# Patient Record
Sex: Female | Born: 1965 | Race: Black or African American | Hispanic: No | Marital: Single | Smoking: Never smoker
Health system: Southern US, Community
[De-identification: ages and names within clinical notes are randomized; demographics above are authoritative.]

## PROBLEM LIST (undated history)

## (undated) DIAGNOSIS — M109 Gout, unspecified: Secondary | ICD-10-CM

## (undated) DIAGNOSIS — E119 Type 2 diabetes mellitus without complications: Secondary | ICD-10-CM

## (undated) DIAGNOSIS — D649 Anemia, unspecified: Secondary | ICD-10-CM

## (undated) DIAGNOSIS — M069 Rheumatoid arthritis, unspecified: Secondary | ICD-10-CM

## (undated) DIAGNOSIS — M419 Scoliosis, unspecified: Secondary | ICD-10-CM

## (undated) DIAGNOSIS — E785 Hyperlipidemia, unspecified: Secondary | ICD-10-CM

## (undated) DIAGNOSIS — R52 Pain, unspecified: Secondary | ICD-10-CM

## (undated) DIAGNOSIS — R51 Headache: Secondary | ICD-10-CM

## (undated) DIAGNOSIS — Z9289 Personal history of other medical treatment: Secondary | ICD-10-CM

## (undated) DIAGNOSIS — D219 Benign neoplasm of connective and other soft tissue, unspecified: Secondary | ICD-10-CM

## (undated) DIAGNOSIS — G709 Myoneural disorder, unspecified: Secondary | ICD-10-CM

## (undated) DIAGNOSIS — R87619 Unspecified abnormal cytological findings in specimens from cervix uteri: Secondary | ICD-10-CM

## (undated) DIAGNOSIS — J45909 Unspecified asthma, uncomplicated: Secondary | ICD-10-CM

## (undated) DIAGNOSIS — G4730 Sleep apnea, unspecified: Secondary | ICD-10-CM

## (undated) DIAGNOSIS — R079 Chest pain, unspecified: Secondary | ICD-10-CM

## (undated) DIAGNOSIS — M1990 Unspecified osteoarthritis, unspecified site: Secondary | ICD-10-CM

## (undated) DIAGNOSIS — N939 Abnormal uterine and vaginal bleeding, unspecified: Secondary | ICD-10-CM

## (undated) DIAGNOSIS — D509 Iron deficiency anemia, unspecified: Secondary | ICD-10-CM

## (undated) DIAGNOSIS — E079 Disorder of thyroid, unspecified: Secondary | ICD-10-CM

## (undated) DIAGNOSIS — M7989 Other specified soft tissue disorders: Secondary | ICD-10-CM

## (undated) DIAGNOSIS — K219 Gastro-esophageal reflux disease without esophagitis: Secondary | ICD-10-CM

## (undated) DIAGNOSIS — F32A Depression, unspecified: Secondary | ICD-10-CM

## (undated) DIAGNOSIS — F329 Major depressive disorder, single episode, unspecified: Secondary | ICD-10-CM

## (undated) DIAGNOSIS — F419 Anxiety disorder, unspecified: Secondary | ICD-10-CM

## (undated) DIAGNOSIS — E039 Hypothyroidism, unspecified: Secondary | ICD-10-CM

## (undated) DIAGNOSIS — I10 Essential (primary) hypertension: Secondary | ICD-10-CM

## (undated) DIAGNOSIS — M797 Fibromyalgia: Secondary | ICD-10-CM

## (undated) DIAGNOSIS — C801 Malignant (primary) neoplasm, unspecified: Secondary | ICD-10-CM

## (undated) DIAGNOSIS — IMO0001 Reserved for inherently not codable concepts without codable children: Secondary | ICD-10-CM

## (undated) HISTORY — DX: Sleep apnea, unspecified: G47.30

## (undated) HISTORY — PX: UPPER GI ENDOSCOPY: SHX6162

## (undated) HISTORY — DX: Anxiety disorder, unspecified: F41.9

## (undated) HISTORY — DX: Depression, unspecified: F32.A

## (undated) HISTORY — DX: Unspecified osteoarthritis, unspecified site: M19.90

## (undated) HISTORY — PX: COLONOSCOPY: SHX174

## (undated) HISTORY — DX: Benign neoplasm of connective and other soft tissue, unspecified: D21.9

## (undated) HISTORY — DX: Unspecified abnormal cytological findings in specimens from cervix uteri: R87.619

## (undated) HISTORY — DX: Abnormal uterine and vaginal bleeding, unspecified: N93.9

## (undated) HISTORY — DX: Gout, unspecified: M10.9

## (undated) HISTORY — DX: Scoliosis, unspecified: M41.9

## (undated) HISTORY — DX: Anemia, unspecified: D64.9

## (undated) HISTORY — DX: Malignant (primary) neoplasm, unspecified: C80.1

## (undated) HISTORY — DX: Myoneural disorder, unspecified: G70.9

## (undated) HISTORY — DX: Major depressive disorder, single episode, unspecified: F32.9

## (undated) HISTORY — DX: Unspecified asthma, uncomplicated: J45.909

## (undated) HISTORY — DX: Fibromyalgia: M79.7

## (undated) HISTORY — DX: Rheumatoid arthritis, unspecified: M06.9

## (undated) HISTORY — DX: Hyperlipidemia, unspecified: E78.5

## (undated) HISTORY — DX: Disorder of thyroid, unspecified: E07.9

## (undated) HISTORY — DX: Iron deficiency anemia, unspecified: D50.9

---

## 1988-09-17 DIAGNOSIS — R87619 Unspecified abnormal cytological findings in specimens from cervix uteri: Secondary | ICD-10-CM

## 1988-09-17 HISTORY — DX: Unspecified abnormal cytological findings in specimens from cervix uteri: R87.619

## 1999-09-04 ENCOUNTER — Other Ambulatory Visit: Admission: RE | Admit: 1999-09-04 | Discharge: 1999-09-04 | Payer: Self-pay | Admitting: Family Medicine

## 2000-03-31 ENCOUNTER — Emergency Department (HOSPITAL_COMMUNITY): Admission: EM | Admit: 2000-03-31 | Discharge: 2000-04-01 | Payer: Self-pay | Admitting: *Deleted

## 2000-04-01 ENCOUNTER — Encounter: Payer: Self-pay | Admitting: Emergency Medicine

## 2000-12-20 ENCOUNTER — Ambulatory Visit (HOSPITAL_COMMUNITY): Admission: RE | Admit: 2000-12-20 | Discharge: 2000-12-20 | Payer: Self-pay | Admitting: *Deleted

## 2000-12-20 ENCOUNTER — Encounter: Payer: Self-pay | Admitting: *Deleted

## 2001-03-03 ENCOUNTER — Encounter: Payer: Self-pay | Admitting: Internal Medicine

## 2001-03-03 ENCOUNTER — Emergency Department (HOSPITAL_COMMUNITY): Admission: EM | Admit: 2001-03-03 | Discharge: 2001-03-03 | Payer: Self-pay | Admitting: Emergency Medicine

## 2002-03-11 ENCOUNTER — Ambulatory Visit (HOSPITAL_COMMUNITY): Admission: RE | Admit: 2002-03-11 | Discharge: 2002-03-11 | Payer: Self-pay | Admitting: Internal Medicine

## 2002-03-11 ENCOUNTER — Encounter: Payer: Self-pay | Admitting: Internal Medicine

## 2003-05-27 ENCOUNTER — Encounter: Payer: Self-pay | Admitting: Emergency Medicine

## 2003-05-27 ENCOUNTER — Emergency Department (HOSPITAL_COMMUNITY): Admission: EM | Admit: 2003-05-27 | Discharge: 2003-05-27 | Payer: Self-pay | Admitting: Emergency Medicine

## 2004-06-08 ENCOUNTER — Emergency Department (HOSPITAL_COMMUNITY): Admission: EM | Admit: 2004-06-08 | Discharge: 2004-06-09 | Payer: Self-pay | Admitting: Emergency Medicine

## 2004-10-02 ENCOUNTER — Emergency Department (HOSPITAL_COMMUNITY): Admission: EM | Admit: 2004-10-02 | Discharge: 2004-10-02 | Payer: Self-pay | Admitting: Emergency Medicine

## 2004-10-11 ENCOUNTER — Ambulatory Visit (HOSPITAL_COMMUNITY): Admission: RE | Admit: 2004-10-11 | Discharge: 2004-10-11 | Payer: Self-pay | Admitting: Sports Medicine

## 2006-02-11 ENCOUNTER — Emergency Department (HOSPITAL_COMMUNITY): Admission: EM | Admit: 2006-02-11 | Discharge: 2006-02-11 | Payer: Self-pay | Admitting: *Deleted

## 2007-09-03 ENCOUNTER — Emergency Department (HOSPITAL_COMMUNITY): Admission: EM | Admit: 2007-09-03 | Discharge: 2007-09-03 | Payer: Self-pay | Admitting: Emergency Medicine

## 2008-01-19 ENCOUNTER — Emergency Department (HOSPITAL_COMMUNITY): Admission: EM | Admit: 2008-01-19 | Discharge: 2008-01-19 | Payer: Self-pay | Admitting: Emergency Medicine

## 2008-06-07 ENCOUNTER — Emergency Department (HOSPITAL_COMMUNITY): Admission: EM | Admit: 2008-06-07 | Discharge: 2008-06-07 | Payer: Self-pay | Admitting: Emergency Medicine

## 2009-05-12 ENCOUNTER — Emergency Department (HOSPITAL_BASED_OUTPATIENT_CLINIC_OR_DEPARTMENT_OTHER): Admission: EM | Admit: 2009-05-12 | Discharge: 2009-05-12 | Payer: Self-pay | Admitting: Emergency Medicine

## 2009-05-12 ENCOUNTER — Ambulatory Visit: Payer: Self-pay | Admitting: Radiology

## 2009-12-17 ENCOUNTER — Ambulatory Visit: Payer: Self-pay | Admitting: Radiology

## 2009-12-17 ENCOUNTER — Emergency Department (HOSPITAL_BASED_OUTPATIENT_CLINIC_OR_DEPARTMENT_OTHER): Admission: EM | Admit: 2009-12-17 | Discharge: 2009-12-17 | Payer: Self-pay | Admitting: Emergency Medicine

## 2010-04-28 ENCOUNTER — Ambulatory Visit (HOSPITAL_COMMUNITY): Admission: RE | Admit: 2010-04-28 | Discharge: 2010-04-28 | Payer: Self-pay | Admitting: Family Medicine

## 2010-10-05 ENCOUNTER — Emergency Department (HOSPITAL_COMMUNITY)
Admission: EM | Admit: 2010-10-05 | Discharge: 2010-10-05 | Payer: Self-pay | Source: Home / Self Care | Admitting: Emergency Medicine

## 2010-10-07 ENCOUNTER — Encounter: Payer: Self-pay | Admitting: Sports Medicine

## 2010-12-06 LAB — COMPREHENSIVE METABOLIC PANEL
ALT: 14 U/L (ref 0–35)
AST: 21 U/L (ref 0–37)
Albumin: 3.8 g/dL (ref 3.5–5.2)
Creatinine, Ser: 0.7 mg/dL (ref 0.4–1.2)
Sodium: 144 mEq/L (ref 135–145)
Total Protein: 8.2 g/dL (ref 6.0–8.3)

## 2010-12-23 LAB — URINALYSIS, ROUTINE W REFLEX MICROSCOPIC
Bilirubin Urine: NEGATIVE
Glucose, UA: NEGATIVE mg/dL
Hgb urine dipstick: NEGATIVE
Ketones, ur: NEGATIVE mg/dL
Nitrite: NEGATIVE
Protein, ur: NEGATIVE mg/dL
Specific Gravity, Urine: 1.019 (ref 1.005–1.030)
Urobilinogen, UA: 1 mg/dL (ref 0.0–1.0)
pH: 7 (ref 5.0–8.0)

## 2010-12-23 LAB — URINE MICROSCOPIC-ADD ON

## 2010-12-23 LAB — PREGNANCY, URINE: Preg Test, Ur: NEGATIVE

## 2011-05-10 ENCOUNTER — Other Ambulatory Visit: Payer: Self-pay | Admitting: Family Medicine

## 2011-05-10 ENCOUNTER — Other Ambulatory Visit (HOSPITAL_COMMUNITY): Payer: Self-pay | Admitting: Family Medicine

## 2011-05-10 DIAGNOSIS — Z1231 Encounter for screening mammogram for malignant neoplasm of breast: Secondary | ICD-10-CM

## 2011-05-18 ENCOUNTER — Ambulatory Visit (HOSPITAL_COMMUNITY)
Admission: RE | Admit: 2011-05-18 | Discharge: 2011-05-18 | Disposition: A | Discharge status: Home / Self Care | Payer: Self-pay | Source: Ambulatory Visit | Attending: Family Medicine | Admitting: Family Medicine

## 2011-05-18 DIAGNOSIS — Z1231 Encounter for screening mammogram for malignant neoplasm of breast: Secondary | ICD-10-CM

## 2011-06-18 ENCOUNTER — Inpatient Hospital Stay (HOSPITAL_COMMUNITY)
Admission: EM | Admit: 2011-06-18 | Discharge: 2011-06-21 | DRG: 313 | Disposition: A | Discharge status: Home / Self Care | Payer: Self-pay | Attending: Family Medicine | Admitting: Family Medicine

## 2011-06-18 ENCOUNTER — Emergency Department (HOSPITAL_COMMUNITY): Payer: Self-pay

## 2011-06-18 DIAGNOSIS — R9431 Abnormal electrocardiogram [ECG] [EKG]: Secondary | ICD-10-CM | POA: Diagnosis present

## 2011-06-18 DIAGNOSIS — R0789 Other chest pain: Principal | ICD-10-CM | POA: Diagnosis present

## 2011-06-18 DIAGNOSIS — E042 Nontoxic multinodular goiter: Secondary | ICD-10-CM | POA: Diagnosis present

## 2011-06-18 DIAGNOSIS — R0602 Shortness of breath: Secondary | ICD-10-CM | POA: Diagnosis present

## 2011-06-18 DIAGNOSIS — Z8249 Family history of ischemic heart disease and other diseases of the circulatory system: Secondary | ICD-10-CM

## 2011-06-18 DIAGNOSIS — R079 Chest pain, unspecified: Secondary | ICD-10-CM | POA: Diagnosis present

## 2011-06-18 DIAGNOSIS — Z6841 Body Mass Index (BMI) 40.0 and over, adult: Secondary | ICD-10-CM

## 2011-06-18 DIAGNOSIS — F411 Generalized anxiety disorder: Secondary | ICD-10-CM | POA: Diagnosis present

## 2011-06-18 DIAGNOSIS — J45909 Unspecified asthma, uncomplicated: Secondary | ICD-10-CM | POA: Diagnosis present

## 2011-06-18 DIAGNOSIS — R42 Dizziness and giddiness: Secondary | ICD-10-CM | POA: Diagnosis present

## 2011-06-18 LAB — CBC
Hemoglobin: 12 g/dL (ref 12.0–15.0)
MCH: 28.2 pg (ref 26.0–34.0)
MCHC: 31.7 g/dL (ref 30.0–36.0)
MCV: 89.2 fL (ref 78.0–100.0)
RBC: 4.25 MIL/uL (ref 3.87–5.11)
RDW: 14.9 % (ref 11.5–15.5)

## 2011-06-18 LAB — TROPONIN I: Troponin I: <0.3 ng/mL (ref <0.30)

## 2011-06-18 LAB — BASIC METABOLIC PANEL
BUN: 13 mg/dL (ref 6–23)
CO2: 25 mEq/L (ref 19–32)
Creatinine, Ser: 0.6 mg/dL (ref 0.50–1.10)
GFR calc Af Amer: >90 mL/min (ref >90)
GFR calc non Af Amer: >90 mL/min (ref >90)

## 2011-06-18 LAB — CK TOTAL AND CKMB (NOT AT ARMC)
CK, MB: 2.2 ng/mL (ref 0.3–4.0)
Relative Index: 1.4 (ref 0.0–2.5)
Total CK: 156 U/L (ref 7–177)

## 2011-06-18 LAB — DIFFERENTIAL
Eosinophils Absolute: 0.2 10*3/uL (ref 0.0–0.7)
Eosinophils Relative: 2 % (ref 0–5)
Lymphs Abs: 3.7 10*3/uL (ref 0.7–4.0)
Monocytes Absolute: 0.8 10*3/uL (ref 0.1–1.0)
Neutro Abs: 5.3 10*3/uL (ref 1.7–7.7)

## 2011-06-18 MED ORDER — IOHEXOL 300 MG/ML  SOLN
100.0000 mL | Freq: Once | INTRAMUSCULAR | Status: AC | PRN
  Administered 2011-06-18: 76 mL via INTRAVENOUS

## 2011-06-19 ENCOUNTER — Inpatient Hospital Stay (HOSPITAL_COMMUNITY): Payer: Self-pay

## 2011-06-19 LAB — LIPID PANEL
Cholesterol: 147 mg/dL (ref 0–200)
HDL: 51 mg/dL (ref >39)
Total CHOL/HDL Ratio: 2.9 RATIO
Triglycerides: 49 mg/dL (ref <150)
VLDL: 10 mg/dL (ref 0–40)

## 2011-06-19 LAB — COMPREHENSIVE METABOLIC PANEL
ALT: 18 U/L (ref 0–35)
AST: 17 U/L (ref 0–37)
Albumin: 3.1 g/dL — ABNORMAL LOW (ref 3.5–5.2)
CO2: 27 mEq/L (ref 19–32)
Calcium: 8.8 mg/dL (ref 8.4–10.5)
GFR calc non Af Amer: >90 mL/min (ref >90)
Sodium: 138 mEq/L (ref 135–145)
Total Protein: 7.7 g/dL (ref 6.0–8.3)

## 2011-06-19 LAB — CARDIAC PANEL(CRET KIN+CKTOT+MB+TROPI)
CK, MB: 1.8 ng/mL (ref 0.3–4.0)
Relative Index: 1.5 (ref 0.0–2.5)
Total CK: 141 U/L (ref 7–177)
Troponin I: <0.3 ng/mL (ref <0.30)

## 2011-06-19 LAB — T4, FREE: Free T4: 0.94 ng/dL (ref 0.80–1.80)

## 2011-06-19 LAB — T3, FREE: T3, Free: 2.4 pg/mL (ref 2.3–4.2)

## 2011-06-20 ENCOUNTER — Inpatient Hospital Stay (HOSPITAL_COMMUNITY): Payer: Self-pay

## 2011-06-21 ENCOUNTER — Other Ambulatory Visit (HOSPITAL_COMMUNITY): Payer: Self-pay

## 2011-06-21 LAB — COMPREHENSIVE METABOLIC PANEL
ALT: 18 U/L (ref 0–35)
Alkaline Phosphatase: 103 U/L (ref 39–117)
BUN: 12 mg/dL (ref 6–23)
CO2: 29 mEq/L (ref 19–32)
Chloride: 100 mEq/L (ref 96–112)
GFR calc Af Amer: >90 mL/min (ref >90)
Glucose, Bld: 97 mg/dL (ref 70–99)
Potassium: 3.6 mEq/L (ref 3.5–5.1)
Total Bilirubin: 0.3 mg/dL (ref 0.3–1.2)

## 2011-06-30 NOTE — Discharge Summary (Signed)
NAME:  Emily Mathis, Emily Mathis               ACCOUNT NO.:  [id number]  MEDICAL RECORD NO.:  [id number]  LOCATION:  1438                         FACILITY:  [facility]  PHYSICIAN:  Pleas Koch, MD        DATE OF BIRTH:  [birth year]  DATE OF ADMISSION:  06/18/2011 DATE OF DISCHARGE:  06/21/2011                              DISCHARGE SUMMARY   DISCHARGE DIAGNOSES: 1. Unlikely cardiogenic chest pain - to follow up with Dr. Jeanella Cara in     the outpatient setting for risk factor modification as well as     cardiac eval. 2. Multinodular goiter with a TSH of 2.26, a T3 of 2.4, and a free T4     of 0.94 - to follow up with Dr. Adrian Prince with [facility]. 3. Hyperlipidemia with LDL cholesterol 86 and total of 137, VLDL 10. 4. Ultrasound done unremarkable for cholecystitis. 5. Morbid obesity. 6. Significant anxiety issues.  DISCHARGE MEDICATIONS:  Are as follows. 1. Ventolin inhaler 2 puffs q.4 p.r.n. 2. Aspirin 81 mg daily.  PERTINENT CONSULTS:  Tera Mater. Evlyn Kanner, M.D. over telephone regarding her thyroid mass.  PERTINENT RADIOLOGICAL STUDIES: 1. CT angiogram chest on June 18, 2011, showing no evidence of PE,     no evidence aortic dissection.  Multinodular goiter with extension     into anterior mediastinum. 2. Chest x-ray portable on June 19, 2011, showed no active evidence     of active cardiac pulmonary disease. 3. Ultrasound of abdomen on June 20, 2011, showed evaluation of     pancreas limited by bowel gas.  Evaluation limited by habitus     without gross abnormality.  Rest of exam unremarkable. 4. Ultrasound soft tissue head and neck showed multinodular goiter     with several nodules seen within the left lobe, 2 of which are     large, measuring 1.1 x 1.2 x 1.8 cm and 2.1 x 1.9x 2 cm.  Based on     size criteria and biopsy of 2 largest thyroid nodules is     recommended.  Lesions are accessible by ultrasound guided fine     needle aspiration.  Third nodule  small in size 6 x 6 x 5 mm shows     potential small calcification - recommend follow up ultrasound in 6     months to assess lesion. (this finding was discussed with Dr.     Adrian Prince, who recommend FNAC - Dr. Fredia Sorrow of Interventional     Radiology reviewed the studies and noted that these are shadowing     areas of possible calcification and did not recommend given the     patient's age and the patient's current euthyroid status anything     other than a repeat ultrasound of neck in 6 months - Dr. Evlyn Kanner to     follow up with the patient as per my discussion with him over     telephone in 1-2 weeks.  HOSPITAL COURSE:  This is a pleasant 45 year old female with asthma on p.r.n. inhaler and anxiety who presents with intermittent chest pain for past month.  She states it was nonexertional about  2 two times per week, most over right sternal junction radiating towards her back.  No nausea, vomiting, chills, fever, black stool, tarry stool, or other issues. There is some facial component and she had a reproducibility.  EKG showed normal sinus rhythm with no ST-T wave changes, normal white count, hemoglobin slightly anemic at 12. BUN 39, creatinine 0.6.  No chest x-ray was done.  Hospice was asked to admit to rule out. Pertinent positives on admission, the patient is oriented in no distress. Blood pressure was 126/56, pulse 83, respirations 18, temperature 99.  Her chest was clear.  No wheezes or rales.  S1 and S2. No murmurs, rubs,or gallops.  No calf tenderness.  Strong pulses.  1. Chest pain.  The patient was ruled out for pulmonary embolism by CT     angiogram and also this ruled out aortic dissection, pulmonary     embolus, and this was low suspicion.  She is admitted to Telemetry     and given 325 of aspirin initially, which subsequently     discontinued.  She had risk factor modification done and dietitian     visited her.  Her LDL is actually not bad for someone with just      morbid obesity and no history of hypertension or other metabolic     disease syndrome.  The patient will need follow up.  I have     recommended the patient to follow up with Dr. Jeanella Cara at his     office as per her convenience just for risk factor further     modification and possible exercise stress test as she is at low-     risk if not intermediate risk for cardiac event. 2. Coincidental multinodular goiter noted on CT chest.  The patient     had a CT angiogram, which showed an incidental multinodular goiter.     Her TSH is normal as per dictation above.  I discussed her care     with Dr. Evlyn Kanner, who recommended initially FNAC; however, on further     discussion with Dr. Fredia Sorrow, who reviewed the images in detail, he     recommended a followup ultrasound in 6 months.  The patient will     need a follow up ultrasound and ultimately follow up with Dr.     Adrian Prince in about 1-2 weeks as per my discussion with him.  I     have elected to hold on any thyroid medications at this time, given     her euthyroid state and possible subclinical hypothyroidism.  I     will leave this decision and discussion up to Dr. Evlyn Kanner as an     outpatient.  Dr. Rinaldo Cloud office number has been given to the patient and the patient is instructed to call. 1. Morbid obesity.  The patient was instructed regarding risk factor     modification once again and will need further follow up with diet     and exercise.  She will see Dr. Andrey Campanile at [facility] on August 01, 2011, at 10:45 and she has eligibility appointment on July 23, 2011, at 9 a.m. for the same.  She is instructed to keep this     appointment. 2. Questionable history of cholecystitis.  The patient had some     element of pain in the right shoulder, which prompted an ultrasound     of the abdomen, which was ultimately  unrevealing for cholecystitis.     The patient was tolerating full diet on day of discharge, had no     other issues.   She was comfortable in bed and had no further need     for issue.  Please note an echocardiogram was done during this     hospitalization, which showed EF of 60% to 65%.  Please note, the     patient did not have an echocardiogram during this admission given     her likely low probability of cardiac ischemia and her positional     quality of pain.  The patient will benefit from further risk factor     modification and workup as an outpatient.  The patient was seen on     discharge, was doing well, had no nausea or vomiting with sitting     in bed with temperature 97.7, pulse 82, respirations 18, blood     pressure 108 to 125 over 73 to 85, O2 sats 97% on room air.  Abdomen: Soft, nontender, nondistended.  CTAB.  S1-S2.  No murmurs, rubs, or gallops.  Pulses were equal throughout.  I have had a full long and extensive discussion with the patient and the patient is agreeable to outpatient management and work.          ______________________________ Pleas Koch, MD     JS/MEDQ  D:  06/21/2011  T:  06/21/2011  Job:  [id number]  cc:   Jeannett Senior A. Evlyn Kanner, M.D. Fax: [phone]  Pamella Pert, MD Fax: [phone]  Dr. Andrey Campanile at [facility]  Electronically Signed by Pleas Koch MD on 06/30/2011 06:53:10 PM

## 2011-07-04 ENCOUNTER — Other Ambulatory Visit (HOSPITAL_COMMUNITY): Payer: Self-pay | Admitting: Cardiology

## 2011-07-04 DIAGNOSIS — R0602 Shortness of breath: Secondary | ICD-10-CM

## 2011-07-04 NOTE — H&P (Signed)
NAME:  Emily Mathis, Emily Mathis NO.:  [id number]  MEDICAL RECORD NO.:  [id number]  LOCATION:  [facility]                         FACILITY:  [facility]  PHYSICIAN:  Houston Siren, MD           DATE OF BIRTH:  1966-07-15  DATE OF ADMISSION:  06/18/2011 DATE OF DISCHARGE:                             HISTORY & PHYSICAL   PRIMARY CARE PHYSICIAN:  None.  ADVANCE DIRECTIVE:  Full code.  REASON FOR ADMISSION:  Chest pain.  HISTORY OF PRESENT ILLNESS:  This is a 45 year old female with benign past medical history (asthma on p.r.n. inhaler) and anxiety, no medications, presents to the emergency room with intermittent chest pain for 1 month.  She states that she has nonexertional chest pain about 2 times per week, mostly over the right costal sternal junction, radiates toward her back, with some shortness of breath and mild diaphoresis. She denied any nausea, vomiting, fever, chills, black stool, or bloody stool.  There is some positional component to her chest pain.  She has no fever, chills, or cough.  Evaluation in emergency room included an EKG showing normal sinus rhythm with no acute ST-T changes.  Normal white count of 10,000, hemoglobin of 12.0, troponin of 0, potassium of 3.7, BUN of 39, creatinine 0.6.  There was no chest x-ray done. Hospitalist was asked to admit the patient for rule out.  PAST MEDICAL HISTORY:  Asthma.  MEDICATIONS:  Ventolin inhaler p.r.n.  ALLERGIES:  Some sort of ANTIBIOTICS, she could not recall.  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 She does not smoke, drink, or use any drugs.  FAMILY HISTORY:  Father died of an MI at age 49.  Mother alive and well, but had 2 MIs prior to 15.  REVIEW OF SYSTEMS:  Otherwise, unremarkable.  PHYSICAL EXAMINATION:  VITAL SIGNS:  Blood pressure 126/66, pulse 83, respiratory rate 18, temperature 99.0. GENERAL:  She is alert and oriented and is in no apparent distress. SKIN:  Warm and  dry. HEENT:  Sclerae nonicteric.  Throat is clear.  Speech is fluent. NECK:  Supple.  No carotid bruit. HEART:  Revealed S1 and S2, regular.  I did not hear any murmur, rub, or gallop. LUNGS:  Clear.  No wheezes, rales, or any evidence of consolidation. Palpation of her anterior chest wall around the right sternocostal junction show minimal to no tenderness. ABDOMEN:  She has no right upper quadrant pain.  Bowel sounds present. Abdomen is slightly obese. EXTREMITIES:  No edema.  No calf tenderness.  Strong pulses peripherally in both upper and lower extremities. NEUROLOGIC AND PSYCHIATRIC:  Unremarkable.  She does appear to be slightly anxious.  OBJECTIVE FINDINGS:  White count 10,000, hemoglobin 12.0.  EKG shows normal sinus rhythm without any acute ST-T changes.  BUN of 89, creatinine of 3.7, troponin of 0.  IMPRESSION:  This is a 45 year old female with increased cardiac risk factor, presenting with atypical chest pain.  She has pain for a month, intermittently and with unremarkable EKG and troponins of 0, I suspect that this is not cardiac in its etiology.  Because of her pain, she said was  severe, radiating toward her back, We will get a CT angiogram for chest to rule out aortic dissection and pulmonary embolism.  This is low suspicion.  We will admit her to Telemetry for rule out, treat her with aspirin and benzodiazepine.  We will get her fasting lipids as well. Risk modifications discussed at length.  We will admit her to [facility] 2, Telemetry>     Houston Siren, MD     PL/MEDQ  D:  06/18/2011  T:  06/18/2011  Job:  [id number]  Electronically Signed by Houston Siren  on 07/03/2011 11:57:08 PM

## 2011-07-10 ENCOUNTER — Ambulatory Visit (HOSPITAL_COMMUNITY): Payer: Self-pay

## 2011-07-10 ENCOUNTER — Encounter (HOSPITAL_COMMUNITY)
Admission: RE | Admit: 2011-07-10 | Discharge: 2011-07-10 | Disposition: A | Discharge status: Home / Self Care | Payer: Self-pay | Source: Ambulatory Visit | Attending: Cardiology | Admitting: Cardiology

## 2011-07-10 DIAGNOSIS — R0602 Shortness of breath: Secondary | ICD-10-CM | POA: Insufficient documentation

## 2011-07-10 MED ORDER — TECHNETIUM TC 99M TETROFOSMIN IV KIT
30.0000 | PACK | Freq: Once | INTRAVENOUS | Status: AC | PRN
  Administered 2011-07-10: 30 via INTRAVENOUS

## 2011-07-10 MED ORDER — TECHNETIUM TC 99M TETROFOSMIN IV KIT
10.0000 | PACK | Freq: Once | INTRAVENOUS | Status: AC | PRN
  Administered 2011-07-10: 10 via INTRAVENOUS

## 2011-07-20 ENCOUNTER — Encounter (HOSPITAL_COMMUNITY): Payer: Self-pay | Admitting: Pharmacy Technician

## 2011-07-23 ENCOUNTER — Encounter (HOSPITAL_COMMUNITY): Payer: Self-pay

## 2011-07-24 ENCOUNTER — Encounter (HOSPITAL_COMMUNITY)
Admission: RE | Disposition: A | Discharge status: Home / Self Care | Payer: Self-pay | Source: Ambulatory Visit | Attending: Cardiology

## 2011-07-24 ENCOUNTER — Ambulatory Visit (HOSPITAL_COMMUNITY)
Admission: RE | Admit: 2011-07-24 | Discharge: 2011-07-24 | Disposition: A | Discharge status: Home / Self Care | Payer: Self-pay | Source: Ambulatory Visit | Attending: Cardiology | Admitting: Cardiology

## 2011-07-24 DIAGNOSIS — R0989 Other specified symptoms and signs involving the circulatory and respiratory systems: Secondary | ICD-10-CM | POA: Insufficient documentation

## 2011-07-24 DIAGNOSIS — R079 Chest pain, unspecified: Secondary | ICD-10-CM

## 2011-07-24 DIAGNOSIS — R0609 Other forms of dyspnea: Secondary | ICD-10-CM | POA: Insufficient documentation

## 2011-07-24 HISTORY — PX: LEFT HEART CATHETERIZATION WITH CORONARY ANGIOGRAM: SHX5451

## 2011-07-24 LAB — PREGNANCY, URINE: Preg Test, Ur: NEGATIVE

## 2011-07-24 SURGERY — LEFT HEART CATHETERIZATION WITH CORONARY ANGIOGRAM

## 2011-07-24 MED ORDER — HEPARIN (PORCINE) IN NACL 2-0.9 UNIT/ML-% IJ SOLN
INTRAMUSCULAR | Status: AC
  Filled 2011-07-24: qty 2000

## 2011-07-24 MED ORDER — ASPIRIN 81 MG PO CHEW
CHEWABLE_TABLET | ORAL | Status: AC
  Administered 2011-07-24: 81 mg
  Filled 2011-07-24: qty 4

## 2011-07-24 MED ORDER — ACETAMINOPHEN 325 MG PO TABS: 650.0000 mg | ORAL_TABLET | ORAL | Status: DC | PRN

## 2011-07-24 MED ORDER — SODIUM CHLORIDE 0.9 % IV SOLN
INTRAVENOUS | Status: DC
  Administered 2011-07-24: 11:00:00 via INTRAVENOUS

## 2011-07-24 MED ORDER — ONDANSETRON HCL 4 MG/2ML IJ SOLN: 4.0000 mg | Freq: Four times a day (QID) | INTRAMUSCULAR | Status: DC | PRN

## 2011-07-24 MED ORDER — NITROGLYCERIN 0.2 MG/ML ON CALL CATH LAB
INTRAVENOUS | Status: AC
  Filled 2011-07-24: qty 1

## 2011-07-24 MED ORDER — MIDAZOLAM HCL 2 MG/2ML IJ SOLN
INTRAMUSCULAR | Status: AC
  Filled 2011-07-24: qty 2

## 2011-07-24 MED ORDER — DIPHENHYDRAMINE HCL 25 MG PO CAPS
ORAL_CAPSULE | ORAL | Status: AC
  Administered 2011-07-24: 14:00:00
  Filled 2011-07-24: qty 1

## 2011-07-24 MED ORDER — ASPIRIN 81 MG PO CHEW
243.0000 mg | CHEWABLE_TABLET | Freq: Once | ORAL | Status: DC
Start: 2011-07-24 — End: 2011-07-24
  Administered 2011-07-24: 243 mg via ORAL

## 2011-07-24 MED ORDER — HEPARIN SODIUM (PORCINE) 1000 UNIT/ML IJ SOLN
INTRAMUSCULAR | Status: AC
  Filled 2011-07-24: qty 1

## 2011-07-24 MED ORDER — HYDROMORPHONE HCL PF 2 MG/ML IJ SOLN
INTRAMUSCULAR | Status: AC
  Filled 2011-07-24: qty 1

## 2011-07-24 MED ORDER — SODIUM CHLORIDE 0.9 % IV SOLN: 1.0000 mL/kg/h | INTRAVENOUS | Status: DC

## 2011-07-24 MED ORDER — VERAPAMIL HCL 2.5 MG/ML IV SOLN
INTRAVENOUS | Status: AC
  Filled 2011-07-24: qty 2

## 2011-07-24 MED ORDER — DIPHENHYDRAMINE HCL 25 MG PO CAPS
25.0000 mg | ORAL_CAPSULE | Freq: Once | ORAL | Status: DC
  Filled 2011-07-24: qty 1

## 2011-07-24 MED ORDER — ASPIRIN 81 MG PO CHEW: 324.0000 mg | CHEWABLE_TABLET | ORAL | Status: DC

## 2011-07-24 NOTE — Op Note (Addendum)
Dictated

## 2011-07-24 NOTE — Brief Op Note (Addendum)
07/24/2011  12:03 PM  PATIENT:  Emily Mathis  45 y.o. female  PRE-OPERATIVE DIAGNOSIS:  Chest Pain  POST-OPERATIVE DIAGNOSIS:  * No post-op diagnosis entered *  PROCEDURE:  Procedure(s): LEFT HEART CATHETERIZATION WITH CORONARY ANGIOGRAM  SURGEON:  Surgeon(s): Pamella Pert  PHYSICIAN ASSISTANT:   ASSISTANTS: none   ANESTHESIA:   local  EBL:     BLOOD ADMINISTERED:none  DRAINS: none   LOCAL MEDICATIONS USED:   LIDOCAINE 2CC  SPECIMEN:  No Specimen  DISPOSITION OF SPECIMEN:  N/A  COUNTS:  YES and NO Not applicable  TOURNIQUET:  * No tourniquets in log *  DICTATION: .Other Dictation: Dictation Number [id number]  PLAN OF CARE: Discharge to home after PACU  PATIENT DISPOSITION:  Short Stay   Delay start of Pharmacological VTE agent (>24hrs) due to surgical blood loss or risk of bleeding:  not applicable

## 2011-07-24 NOTE — H&P (Signed)
f/u cardiolite done @ [facility] per [facility] d[facility] ([facility])  (Appt time: 10:00 AM) (Arrival time: 10:09 AM)  HOPI: Patient admitted to [facility] hospital on 10/1-10/4/12 Admitted with atypical chest pain and found to have multinodular goitre.   This is a pleasant 45 year old female with asthma on p.r.n.   inhaler and anxiety who presents with intermittent chest pain for past month.   She states it was nonexertional about 2 two times per week, most over right sternal junction radiating towards her back.   No nausea, vomiting, chills, fever, black stool, tarry stool, or other issues.  Chest pain started 1 1/2 months  ago.   It is described as pressure.   Frequency is about 1  per every to every other day.   It is located in the retrosternal region.   It feels like  radiate to the arm on the right and back.   Chest discomfort lasts 30 minutes.   Chest discomfort is associated with dyspnea; PND 3 times in the last one month.   Orthopnea present and uses 3-4 pillows and ongoing orthopnea over the last one year.   No Diaphoresis; but has feltPalpitations;  No Nausea; vomiting, Dizziness or near syncope.   Chest pain comes on with or without activity.   Difficult to describe and vague.   No h/o syncope, hemoptysis.    Patient has had a negative Cardiolite stress test on 07/10/11.  In spite of this she continues to have significant chest discomfort which is very concerning to her.  At times she is found to have significant shortness of breath associated with this.  She has taken Aleve without any relief.  ROS: Arthritis-yes, life-style limiting-no;  Cramping of the legs and feet at night or with activity-yes (few times a month); Diabetes-no;  Hypothyroidism-goiter;  Previous Stroke-no, Previous GI Bleed-no ,  Recent weight change-no.   Other systems negative.   Known allergies: NKDA.  Physical Exam: Height 63", weight 257 Lbs, BMI 45, BP 120/80 mm Hg, HR 88/min, regular   GENERAL: Moderately built and morbidly  body  habitus, who is in no acute distress.   Alert and oriented  x 3.   Appears stated age.   Alert Ox3.   EYES: PERRLA, EOM's full, conjunctivae clear.   Nose: Mucosa normal, no obstruction.   THROAT: Clear, no exudates, no lesions.   NECK: Supple, no masses, no thyromegaly, no JVD.   CHEST: Lungs clear, no rales, no rhonchi, no wheezes.   Easily reproducible chest pain right costochondral junction.   HEART: S1S2 normal,  no murmurs, no rubs, no gallops.   ABDOMEN: Soft, no tenderness, no masses, BS normal.   BACK: Normal curvature, no tenderness.   EXTREMITIES: Full range of motion, no deformities, no edema, no erythema.   NEURO: Alert, oriented x 3,  no localizing findings.   SKIN: Normal, no rashes, no lesions noted.   No ulcerations or signs of limb ischemia.   No xanthelasma or Xanthoma.   Vascular exam: Normal carotid upstroke.   No bruit.   Abdominal aortic palpation appears to be normal without prominent pulsation.   No bruit.   Femoral and pedal pulses are normal.  Assessment:  1.   Chest pain probably not cardiac.   Tietze's syndrome.   Easily reproducible chest pain with right costochondral junction.    ECG 06/29/11: NSR, Inferior non specific ST depression probably suggests hypertension, CRO ischemia.   Nuclear stress test 07/10/11 Normal perfusion. no ischemia.  Normal LVEF.   echocardiogram  06/21/11  EF of 60% to 65%.   No wallmotion abnormalities. 2.   Obesity, Morbid, denies  obstructive sleep apnea symptoms.    3.   Shortness of breath/Dyspnea on exertion.   Due to deconditioning.   Anginal equivalent  probably not.   I also suspect mediastinal extension of the thyroid to be responnsible.   Patient is seeing Dr.   Rinaldo Cloud.   Patint admitted to [facility] hospital on 10/1-10/4/12 Admitted with atypical chest pain.   CT angiogram chest on June 18, 2011, showing no evidence of PE, no evidence aortic dissection.   Multinodular goiter with extension  into anterior mediastinum.  DIAGNOSES:        Precordial pain [786.51]       Nonspecific abnormal electrocardiogram [ECG] [EKG] [794.31]       Shortness of breath [786.05]       Morbid obesity [278.01]    Mechanism of underlying disease process and action of medications discussed with the patient.   I discussed primary/secondary prevention and also dietary counceling was done.   Calorie restriction was discussed.   Weight loss stressed again and gave positive reinforcement.      Patient states that in spite of taking Aleve she continues to have chest pain.  She is extremely concerned that she could still have coronary artery disease.  She is also concerned about pulmonary embolism as her father had pulmonary embolism in the past.  I have tried to reassure her and explained that during recent hospitalization a CT scan and CT angiography had excluded toenail present.  After extensive discussion with the patient we have decided to proceed with cardiac catheterization for definitive diagnosis of coronary artery disease.  I also prescribed her Tramadal 50 mg 3 times a day when necessary. Schedule for cardiac catheterization. We discussed regarding risks, benefits, alternatives to this including stress testing, CTA and continued medical therapy. Patient wants to proceed. Understands <1-2% risk of death, stroke, MI, urgent CABG, bleeding, infection, renal failure but not limited to these.  i will send my note to [facility] and [facility] after cardiac cath.  MEDICATIONS:       Aspirin oral tablet 81 mg once a day                   prescription:   not prescribed this visit       TraMADol Hydrochloride (traMADol) oral tablet 50 mg One tablet 3 times a day as needed. (start date: 07/19/2011)                    prescription:   qty 45 of 50 mg  One tablet 3 times a day as needed.  (NO refills)       Ventolin HFA (albuterol) inhalation aerosol CFC free 90 mcg/inh Prn                   prescription:   not prescribed this visit       Aleve (naproxen) oral  tablet sodium 220 mg Take 2 tabs every 4 hours prn for chest pain                   prescription:   not prescribed this visit   Harlee Pursifull,JAGADEESH R  11:27 AM 07/24/2011

## 2011-07-25 NOTE — Cardiovascular Report (Signed)
NAME:  Emily Mathis, Emily Mathis NO.:  [id number]  MEDICAL RECORD NO.:  [id number]  LOCATION:  [facility]                         FACILITY:  [facility]  PHYSICIAN:  Pamella Pert, MD DATE OF BIRTH:  [birth year]  DATE OF PROCEDURE:  07/24/2011 DATE OF DISCHARGE:                           CARDIAC CATHETERIZATION   PROCEDURE PERFORMED:  Left heart catheterization including: 1. Left ventriculography. 2. Selective right and left coronary arteriography.  INDICATION:  [name] is a 45 year old African American female with no significant prior cardiovascular history who has been having chronic chest pain and dyspnea on exertion.  She has had multiple hospital admissions and outpatient evaluations.  She was ruled out for pulmonary embolism in the past.  Because of continued chest discomfort on presentation to our office with chest pain in spite of negative stress test that was done recently, she is now brought to the cardiac catheterization lab to evaluate her coronary anatomy for definitive diagnosis of coronary disease.  HEMODYNAMIC DATA:  The left ventricle pressure was 113/5 with end diastole pressure of 11 mmHg.  Aortic pressure was 110/74 with a mean of 87 mmHg.  There is no pressure gradient across the aortic valve.  ANGIOGRAPHIC DATA:  Left ventricular:  Left ventricular systolic function was normal with ejection fraction of 60%.  There is no significant mitral regurgitation.  Right coronary artery:  Right coronary artery is a large caliber vessel and dominant vessel.  It is smooth and normal.  Left main coronary artery:  Left main coronary artery was large caliber vessel.  It is smooth and normal.  LAD:  LAD is a large caliber vessel.  It gives origin to large diagonal 1.  It is smooth and normal.  Circumflex:  Circumflex is a large caliber vessel.  Smooth and normal. The circumflex gives origin to a small-sized obtuse marginal 1 and large obtuse marginal 2 and  continues as obtuse marginal 3.  IMPRESSION: 1. Normal coronary arteries and normal left ventricular systolic     function. 2. Normal left ventricular end-diastolic pressure.  RECOMMENDATION:  Based on the coronary angiography, evaluation for noncardiac cause of chest pain is indicated.  TECHNIQUE OF PROCEDURE:  Under sterile precautions using a 6-French right radial access, a 6-French TIG #4 catheter was advanced into the ascending aorta and left and right coronary arteriography was performed. The catheter was then pulled out of body where an exchange length safety J-wire, followed by performing left ventriculography  using a 5-French angled pigtail catheter after the catheter exchanges were done over the J-wire.  Hemostasis was obtained by applying TR band.  The patient tolerated the procedure well.  No immediate complications.     Pamella Pert, MD     JRG/MEDQ  D:  07/24/2011  T:  07/24/2011  Job:  [id number]  cc:   Jeannett Senior A. Evlyn Kanner, M.D.

## 2011-07-27 ENCOUNTER — Encounter (HOSPITAL_COMMUNITY): Payer: Self-pay

## 2011-09-18 HISTORY — PX: CARDIAC CATHETERIZATION: SHX172

## 2011-10-26 ENCOUNTER — Other Ambulatory Visit: Payer: Self-pay | Admitting: Endocrinology

## 2011-10-26 DIAGNOSIS — E042 Nontoxic multinodular goiter: Secondary | ICD-10-CM

## 2011-10-30 ENCOUNTER — Ambulatory Visit
Admission: RE | Admit: 2011-10-30 | Discharge: 2011-10-30 | Disposition: A | Discharge status: Home / Self Care | Payer: No Typology Code available for payment source | Source: Ambulatory Visit | Attending: Endocrinology | Admitting: Endocrinology

## 2011-10-30 DIAGNOSIS — E042 Nontoxic multinodular goiter: Secondary | ICD-10-CM

## 2011-12-04 ENCOUNTER — Other Ambulatory Visit: Payer: Self-pay | Admitting: Endocrinology

## 2011-12-04 DIAGNOSIS — E041 Nontoxic single thyroid nodule: Secondary | ICD-10-CM

## 2011-12-05 ENCOUNTER — Ambulatory Visit
Admission: RE | Admit: 2011-12-05 | Discharge: 2011-12-05 | Disposition: A | Discharge status: Home / Self Care | Payer: No Typology Code available for payment source | Source: Ambulatory Visit | Attending: Endocrinology | Admitting: Endocrinology

## 2011-12-05 ENCOUNTER — Other Ambulatory Visit (HOSPITAL_COMMUNITY)
Admission: RE | Admit: 2011-12-05 | Discharge: 2011-12-05 | Disposition: A | Discharge status: Home / Self Care | Payer: Self-pay | Source: Ambulatory Visit | Attending: Diagnostic Radiology | Admitting: Diagnostic Radiology

## 2011-12-05 DIAGNOSIS — E049 Nontoxic goiter, unspecified: Secondary | ICD-10-CM | POA: Insufficient documentation

## 2011-12-05 DIAGNOSIS — E041 Nontoxic single thyroid nodule: Secondary | ICD-10-CM

## 2012-06-27 ENCOUNTER — Other Ambulatory Visit (HOSPITAL_COMMUNITY): Payer: Self-pay | Admitting: Obstetrics & Gynecology

## 2012-06-27 DIAGNOSIS — Z1231 Encounter for screening mammogram for malignant neoplasm of breast: Secondary | ICD-10-CM

## 2012-07-31 ENCOUNTER — Ambulatory Visit (HOSPITAL_COMMUNITY)
Admission: RE | Admit: 2012-07-31 | Discharge: 2012-07-31 | Disposition: A | Discharge status: Home / Self Care | Payer: Self-pay | Source: Ambulatory Visit | Attending: Obstetrics & Gynecology | Admitting: Obstetrics & Gynecology

## 2012-07-31 DIAGNOSIS — Z1231 Encounter for screening mammogram for malignant neoplasm of breast: Secondary | ICD-10-CM

## 2012-10-14 ENCOUNTER — Other Ambulatory Visit: Payer: Self-pay | Admitting: Endocrinology

## 2012-10-14 DIAGNOSIS — E042 Nontoxic multinodular goiter: Secondary | ICD-10-CM

## 2012-10-16 ENCOUNTER — Encounter (INDEPENDENT_AMBULATORY_CARE_PROVIDER_SITE_OTHER): Payer: Self-pay | Admitting: Surgery

## 2012-10-16 ENCOUNTER — Ambulatory Visit
Admission: RE | Admit: 2012-10-16 | Discharge: 2012-10-16 | Disposition: A | Discharge status: Home / Self Care | Payer: No Typology Code available for payment source | Source: Ambulatory Visit | Attending: Endocrinology | Admitting: Endocrinology

## 2012-10-16 DIAGNOSIS — E042 Nontoxic multinodular goiter: Secondary | ICD-10-CM

## 2012-10-21 ENCOUNTER — Ambulatory Visit (INDEPENDENT_AMBULATORY_CARE_PROVIDER_SITE_OTHER): Payer: Self-pay | Admitting: Surgery

## 2012-10-21 ENCOUNTER — Encounter (INDEPENDENT_AMBULATORY_CARE_PROVIDER_SITE_OTHER): Payer: Self-pay | Admitting: Surgery

## 2012-10-21 VITALS — BP 140/82 | HR 115 | Temp 96.2°F | Resp 18 | Ht 63.0 in | Wt 270.2 lb

## 2012-10-21 DIAGNOSIS — E042 Nontoxic multinodular goiter: Secondary | ICD-10-CM

## 2012-10-21 NOTE — Patient Instructions (Signed)

## 2012-10-21 NOTE — Progress Notes (Signed)
General Surgery [facility]  Chief Complaint  Patient presents with  . New Evaluation    eval thyroid nodule - referral from Dr. Ardyth Harps    HISTORY: Patient is a 47 year old black female referred by her endocrinologist for consideration for total thyroidectomy for management of multinodular goiter with compressive symptoms. Patient has been followed with sequential ultrasound scanning showing a mild progressive enlargement of thyroid nodules. Previous fine-needle aspiration biopsy of the dominant 2.7 cm nodule in the left thyroid lobe showed benign cytopathology.  Patient mainly complains of compressive symptoms. She sleeps propped up on 3 pillows due to dyspnea when laying flat. She notes mild discomfort in the neck and a globus sensation. She complains of solid food dysphagia. She complains of tenderness and avoid tight-fitting clothing around the neck.  Patient has never been on thyroid medication. She has had no prior head or neck surgery. There is no family history of thyroid disease. There is no history of other endocrinopathy.  Past Medical History  Diagnosis Date  . Hyperlipidemia   . Anxiety   . Asthma   . Thyroid disease   . Arthritis   . Cancer     Pre-Cancer-Ovarian     Current Outpatient Prescriptions  Medication Sig Dispense Refill  . albuterol (PROVENTIL HFA;VENTOLIN HFA) 108 (90 BASE) MCG/ACT inhaler Inhale 2 puffs into the lungs daily as needed. For shortness of breath          Allergies  Allergen Reactions  . Latex     Itchy & leaves marks on skin  . Dilaudid (Hydromorphone Hcl) Itching  . Hydrocodone Itching     Family History  Problem Relation Age of Onset  . Heart disease Mother   . Stroke Mother   . Heart disease Father   . Colon cancer Father   . Breast cancer Paternal Aunt   . Cancer Paternal Grandmother   . Cancer Paternal Aunt      History   Social History  . Marital Status: Single    Spouse Name: N/A   Number of Children: N/A  . Years of Education: N/A   Social History Main Topics  . Smoking status: Never Smoker   . Smokeless tobacco: Never Used  . Alcohol Use: Yes     Comment: Occasional  . Drug Use: No  . Sexually Active:    Other Topics Concern  . None   Social History Narrative  . None     REVIEW OF SYSTEMS - PERTINENT POSITIVES ONLY: Moderate compressive symptoms including dyspnea, solid food dysphagia, globus sensation, and tenderness to palpation. Denies tremor. Denies palpitations.  EXAM: Filed Vitals:   10/21/12 1420  BP: 140/82  Pulse: 115  Temp: 96.2 F (35.7 C)  Resp: 18    HEENT: normocephalic; pupils equal and reactive; sclerae clear; dentition good; mucous membranes moist NECK:  Enlarged left thyroid lobe with nodular texture; symmetric on extension; no palpable anterior or posterior cervical lymphadenopathy; no supraclavicular masses; no tenderness CHEST: clear to auscultation bilaterally without rales, rhonchi, or wheezes CARDIAC: regular rate and rhythm without significant murmur; peripheral pulses are full EXT:  non-tender without edema; no deformity NEURO: no gross focal deficits; no sign of tremor   LABORATORY RESULTS: See [facility] (CHL-Epic) for most recent results   RADIOLOGY RESULTS: See [facility] (CHL-Epic) for most recent results   IMPRESSION: Multinodular thyroid goiter with compressive symptoms  PLAN: The patient and I discussed the above findings at length. There appear to be  multiple nodules in the left thyroid lobe. The dominant nodule was biopsied. However there are smaller nodules which did contain calcifications which have not been sampled. It appears that her primary complaint is due to compressive symptoms. There is no absolute indication for total thyroidectomy, but there is a relative indication both for definitive diagnosis and for control of compressive symptoms. The patient and I discussed total thyroidectomy  at length. We discussed the risk and benefits of the procedure. We discussed potential complications including recurrent nerve injury and injury to parathyroid glands. We discussed the surgical incision to be anticipated. We discussed the hospital course and her postoperative recovery and return to work. She understands and wishes to proceed with surgery in the near future. We discussed the need for lifelong thyroid hormone replacement therapy. We will make arrangements for surgery in the near future at a time convenient for the patient.  The risks and benefits of the procedure have been discussed at length with the patient.  The patient understands the proposed procedure, potential alternative treatments, and the course of recovery to be expected.  All of the patient's questions have been answered at this time.  The patient wishes to proceed with surgery.  Velora Heckler, MD, FACS General & Endocrine Surgery [facility]   Visit Diagnoses: 1. Multinodular goiter (nontoxic)     Primary Care Physician: Julian Hy, MD

## 2012-12-05 ENCOUNTER — Encounter (HOSPITAL_COMMUNITY): Payer: Self-pay | Admitting: Pharmacy Technician

## 2012-12-12 ENCOUNTER — Ambulatory Visit (HOSPITAL_COMMUNITY)
Admission: RE | Admit: 2012-12-12 | Discharge: 2012-12-12 | Disposition: A | Discharge status: Home / Self Care | Payer: BC Managed Care – PPO | Source: Ambulatory Visit | Attending: Surgery | Admitting: Surgery

## 2012-12-12 ENCOUNTER — Encounter (HOSPITAL_COMMUNITY)
Admission: RE | Admit: 2012-12-12 | Discharge: 2012-12-12 | Disposition: A | Discharge status: Home / Self Care | Payer: BC Managed Care – PPO | Source: Ambulatory Visit | Attending: Surgery | Admitting: Surgery

## 2012-12-12 ENCOUNTER — Encounter (HOSPITAL_COMMUNITY): Payer: Self-pay

## 2012-12-12 DIAGNOSIS — Z01810 Encounter for preprocedural cardiovascular examination: Secondary | ICD-10-CM | POA: Insufficient documentation

## 2012-12-12 DIAGNOSIS — Z01818 Encounter for other preprocedural examination: Secondary | ICD-10-CM | POA: Insufficient documentation

## 2012-12-12 DIAGNOSIS — R059 Cough, unspecified: Secondary | ICD-10-CM | POA: Insufficient documentation

## 2012-12-12 DIAGNOSIS — Z01812 Encounter for preprocedural laboratory examination: Secondary | ICD-10-CM | POA: Insufficient documentation

## 2012-12-12 DIAGNOSIS — R05 Cough: Secondary | ICD-10-CM | POA: Insufficient documentation

## 2012-12-12 DIAGNOSIS — R9431 Abnormal electrocardiogram [ECG] [EKG]: Secondary | ICD-10-CM | POA: Insufficient documentation

## 2012-12-12 DIAGNOSIS — J45909 Unspecified asthma, uncomplicated: Secondary | ICD-10-CM | POA: Insufficient documentation

## 2012-12-12 HISTORY — DX: Headache: R51

## 2012-12-12 HISTORY — DX: Gastro-esophageal reflux disease without esophagitis: K21.9

## 2012-12-12 HISTORY — DX: Essential (primary) hypertension: I10

## 2012-12-12 LAB — BASIC METABOLIC PANEL
Chloride: 101 mEq/L (ref 96–112)
GFR calc Af Amer: >90 mL/min (ref >90)
GFR calc non Af Amer: >90 mL/min (ref >90)
Potassium: 3.4 mEq/L — ABNORMAL LOW (ref 3.5–5.1)
Sodium: 137 mEq/L (ref 135–145)

## 2012-12-12 LAB — CBC
HCT: 40.9 % (ref 36.0–46.0)
Platelets: 413 10*3/uL — ABNORMAL HIGH (ref 150–400)
RDW: 14.1 % (ref 11.5–15.5)
WBC: 6.7 10*3/uL (ref 4.0–10.5)

## 2012-12-12 LAB — SURGICAL PCR SCREEN
MRSA, PCR: NEGATIVE
Staphylococcus aureus: NEGATIVE

## 2012-12-12 LAB — HCG, SERUM, QUALITATIVE: Preg, Serum: NEGATIVE

## 2012-12-12 NOTE — Patient Instructions (Addendum)
20      Your procedure is scheduled on:  Thursday 12/18/2012  Report to [facility] at  0530 AM.  Call this number if you have problems the morning of surgery: [phone]   Remember:             IF YOU USE CPAP,BRING MASK AND TUBING AM OF SURGERY!   Do not eat food or drink liquids AFTER MIDNIGHT!  Take these medicines the morning of surgery with A SIP OF WATER: May use Albuterol inhaler if needed and bring inhaler with you to hospital!   Do not bring valuables to the hospital.  .  Leave suitcase in the car. After surgery it may be brought to your room.  For patients admitted to the hospital, checkout time is 11:00 AM the day of              Discharge.    DO NOT WEAR JEWELRY , MAKE-UP, LOTIONS,POWDERS,PERFUMES!             WOMEN -DO NOT SHAVE LEGS OR UNDERARMS 12 HRS. BEFORE SURGERY!               MEN MAY SHAVE AS USUAL!             CONTACTS,DENTURES OR BRIDGEWORK, FALSE EYELASHES MAY NOT BE WORN INTO SURGERY!                                           Patients discharged the day of surgery will not be allowed to drive home.  If going home the same day of surgery, must have someone stay with youfirst 24 hrs.at home and arrange for someone to drive you home from the Hospital.                         YOUR DRIVER IS: cousin -Eliott Nine or boyfriend Danne Baxter   Special Instructions:             Please read over the following fact sheets that you were given:             1. [place] PREPARING FOR SURGERY SHEET              2.MRSA INFORMATION              3.[facility]OM[facility]                                        Telford Nab.Khamya Topp,RN,BSN     [phone]                FAILURE TO FOLLOW THESE INSTRUCTIONS MAY RESULT IN   CANCELLATION OF YOUR SURGERY!               Patient Signature:___________________________

## 2012-12-12 NOTE — Progress Notes (Signed)
12/12/12 1055  OBSTRUCTIVE SLEEP APNEA  Have you ever been diagnosed with sleep apnea through a sleep study? No  Do you snore loudly (loud enough to be heard through closed doors)?  0  Do you often feel tired, fatigued, or sleepy during the daytime? 1  Has anyone observed you stop breathing during your sleep? 1  Do you have, or are you being treated for high blood pressure? 1  BMI more than 35 kg/m2? 1  Age over 47 years old? 0  Neck circumference greater than 40 cm/18 inches? 0  Gender: 0  Obstructive Sleep Apnea Score 4  Score 4 or greater  Results sent to PCP

## 2012-12-12 NOTE — Progress Notes (Signed)
LOV from Dr. Jacinto Halim 08/30/2011 ,also note from 07/18/2013, Resting myocardial SPECT imaging from 07/10/2011 [place] on chartHeart Catherization from [facility] 07/24/2011 on chart.

## 2012-12-18 ENCOUNTER — Encounter (HOSPITAL_COMMUNITY): Payer: Self-pay | Admitting: *Deleted

## 2012-12-18 ENCOUNTER — Encounter (HOSPITAL_COMMUNITY): Payer: Self-pay | Admitting: Anesthesiology

## 2012-12-18 ENCOUNTER — Inpatient Hospital Stay (HOSPITAL_COMMUNITY)
Admission: RE | Admit: 2012-12-18 | Discharge: 2012-12-23 | DRG: 290 | Disposition: A | Discharge status: Home / Self Care | Payer: BC Managed Care – PPO | Source: Ambulatory Visit | Attending: Surgery | Admitting: Surgery

## 2012-12-18 ENCOUNTER — Encounter (HOSPITAL_COMMUNITY)
Admission: RE | Disposition: A | Discharge status: Home / Self Care | Payer: Self-pay | Source: Ambulatory Visit | Attending: Surgery

## 2012-12-18 ENCOUNTER — Telehealth (INDEPENDENT_AMBULATORY_CARE_PROVIDER_SITE_OTHER): Payer: Self-pay

## 2012-12-18 ENCOUNTER — Ambulatory Visit (HOSPITAL_COMMUNITY): Payer: BC Managed Care – PPO | Admitting: Anesthesiology

## 2012-12-18 DIAGNOSIS — I10 Essential (primary) hypertension: Secondary | ICD-10-CM | POA: Diagnosis present

## 2012-12-18 DIAGNOSIS — Z6841 Body Mass Index (BMI) 40.0 and over, adult: Secondary | ICD-10-CM

## 2012-12-18 DIAGNOSIS — E042 Nontoxic multinodular goiter: Principal | ICD-10-CM | POA: Diagnosis present

## 2012-12-18 DIAGNOSIS — J45909 Unspecified asthma, uncomplicated: Secondary | ICD-10-CM | POA: Diagnosis present

## 2012-12-18 DIAGNOSIS — E785 Hyperlipidemia, unspecified: Secondary | ICD-10-CM | POA: Diagnosis present

## 2012-12-18 DIAGNOSIS — K219 Gastro-esophageal reflux disease without esophagitis: Secondary | ICD-10-CM | POA: Diagnosis present

## 2012-12-18 DIAGNOSIS — E876 Hypokalemia: Secondary | ICD-10-CM | POA: Diagnosis not present

## 2012-12-18 DIAGNOSIS — F411 Generalized anxiety disorder: Secondary | ICD-10-CM | POA: Diagnosis present

## 2012-12-18 DIAGNOSIS — Z79899 Other long term (current) drug therapy: Secondary | ICD-10-CM

## 2012-12-18 HISTORY — PX: THYROIDECTOMY: SHX17

## 2012-12-18 SURGERY — THYROIDECTOMY
Anesthesia: General | Site: Neck | Wound class: Clean

## 2012-12-18 MED ORDER — SUCCINYLCHOLINE CHLORIDE 20 MG/ML IJ SOLN
INTRAMUSCULAR | Status: DC | PRN
  Administered 2012-12-18: 140 mg via INTRAVENOUS

## 2012-12-18 MED ORDER — LIDOCAINE HCL (CARDIAC) 20 MG/ML IV SOLN
INTRAVENOUS | Status: DC | PRN
  Administered 2012-12-18: 80 mg via INTRAVENOUS

## 2012-12-18 MED ORDER — DEXTROSE 5 % IV SOLN
3.0000 g | INTRAVENOUS | Status: AC
  Administered 2012-12-18: 3 g via INTRAVENOUS
  Filled 2012-12-18: qty 3000

## 2012-12-18 MED ORDER — LACTATED RINGERS IV SOLN
INTRAVENOUS | Status: DC
  Administered 2012-12-18: 1000 mL via INTRAVENOUS

## 2012-12-18 MED ORDER — CEFAZOLIN SODIUM 1-5 GM-% IV SOLN
INTRAVENOUS | Status: AC
  Filled 2012-12-18: qty 50

## 2012-12-18 MED ORDER — FENTANYL CITRATE 0.05 MG/ML IJ SOLN
INTRAMUSCULAR | Status: DC | PRN
  Administered 2012-12-18: 100 ug via INTRAVENOUS
  Administered 2012-12-18 (×2): 50 ug via INTRAVENOUS

## 2012-12-18 MED ORDER — ONDANSETRON HCL 4 MG PO TABS: 4.0000 mg | ORAL_TABLET | Freq: Four times a day (QID) | ORAL | Status: DC | PRN

## 2012-12-18 MED ORDER — CEFAZOLIN SODIUM-DEXTROSE 2-3 GM-% IV SOLR
INTRAVENOUS | Status: AC
  Filled 2012-12-18: qty 50

## 2012-12-18 MED ORDER — PROMETHAZINE HCL 25 MG/ML IJ SOLN: 12.5000 mg | INTRAMUSCULAR | Status: DC | PRN

## 2012-12-18 MED ORDER — ROCURONIUM BROMIDE 100 MG/10ML IV SOLN
INTRAVENOUS | Status: DC | PRN
  Administered 2012-12-18: 10 mg via INTRAVENOUS
  Administered 2012-12-18: 30 mg via INTRAVENOUS
  Administered 2012-12-18 (×2): 10 mg via INTRAVENOUS

## 2012-12-18 MED ORDER — ACETAMINOPHEN 10 MG/ML IV SOLN
INTRAVENOUS | Status: DC | PRN
  Administered 2012-12-18: 1000 mg via INTRAVENOUS

## 2012-12-18 MED ORDER — PNEUMOCOCCAL VAC POLYVALENT 25 MCG/0.5ML IJ INJ
0.5000 mL | INJECTION | INTRAMUSCULAR | Status: AC
  Administered 2012-12-19: 0.5 mL via INTRAMUSCULAR
  Filled 2012-12-18 (×2): qty 0.5

## 2012-12-18 MED ORDER — ACETAMINOPHEN 500 MG PO TABS
500.0000 mg | ORAL_TABLET | Freq: Four times a day (QID) | ORAL | Status: DC | PRN
  Administered 2012-12-19: 500 mg via ORAL
  Filled 2012-12-18: qty 1

## 2012-12-18 MED ORDER — CALCIUM CARBONATE 1250 (500 CA) MG PO TABS
1250.0000 mg | ORAL_TABLET | Freq: Three times a day (TID) | ORAL | Status: DC
  Administered 2012-12-18 – 2012-12-19 (×3): 1250 mg via ORAL
  Filled 2012-12-18 (×8): qty 1

## 2012-12-18 MED ORDER — FENTANYL CITRATE 0.05 MG/ML IJ SOLN: 25.0000 ug | INTRAMUSCULAR | Status: DC | PRN

## 2012-12-18 MED ORDER — LIDOCAINE HCL 4 % MT SOLN
OROMUCOSAL | Status: DC | PRN
  Administered 2012-12-18: 4 mL via TOPICAL

## 2012-12-18 MED ORDER — ACETAMINOPHEN 10 MG/ML IV SOLN
INTRAVENOUS | Status: AC
  Filled 2012-12-18: qty 100

## 2012-12-18 MED ORDER — TRAMADOL HCL 50 MG PO TABS
100.0000 mg | ORAL_TABLET | Freq: Four times a day (QID) | ORAL | Status: DC | PRN
  Administered 2012-12-18 – 2012-12-22 (×9): 100 mg via ORAL
  Filled 2012-12-18 (×9): qty 2

## 2012-12-18 MED ORDER — MORPHINE SULFATE 2 MG/ML IJ SOLN
1.0000 mg | INTRAMUSCULAR | Status: DC | PRN
  Administered 2012-12-18 – 2012-12-21 (×11): 2 mg via INTRAVENOUS
  Administered 2012-12-21 (×4): 4 mg via INTRAVENOUS
  Administered 2012-12-21: 2 mg via INTRAVENOUS
  Administered 2012-12-21 – 2012-12-22 (×2): 4 mg via INTRAVENOUS
  Filled 2012-12-18 (×3): qty 1
  Filled 2012-12-18: qty 2
  Filled 2012-12-18: qty 1
  Filled 2012-12-18: qty 2
  Filled 2012-12-18: qty 1
  Filled 2012-12-18: qty 2
  Filled 2012-12-18: qty 1
  Filled 2012-12-18: qty 2
  Filled 2012-12-18 (×2): qty 1
  Filled 2012-12-18: qty 2
  Filled 2012-12-18: qty 1
  Filled 2012-12-18 (×2): qty 2
  Filled 2012-12-18 (×2): qty 1

## 2012-12-18 MED ORDER — MIDAZOLAM HCL 5 MG/5ML IJ SOLN
INTRAMUSCULAR | Status: DC | PRN
  Administered 2012-12-18: 2 mg via INTRAVENOUS

## 2012-12-18 MED ORDER — KCL IN DEXTROSE-NACL 20-5-0.45 MEQ/L-%-% IV SOLN
INTRAVENOUS | Status: DC
  Administered 2012-12-18 – 2012-12-21 (×5): via INTRAVENOUS
  Filled 2012-12-18 (×8): qty 1000

## 2012-12-18 MED ORDER — PROMETHAZINE HCL 25 MG/ML IJ SOLN: 6.2500 mg | INTRAMUSCULAR | Status: DC | PRN

## 2012-12-18 MED ORDER — KCL IN DEXTROSE-NACL 20-5-0.45 MEQ/L-%-% IV SOLN
INTRAVENOUS | Status: AC
  Filled 2012-12-18: qty 1000

## 2012-12-18 MED ORDER — 0.9 % SODIUM CHLORIDE (POUR BTL) OPTIME
TOPICAL | Status: DC | PRN
  Administered 2012-12-18: 1000 mL

## 2012-12-18 MED ORDER — FENTANYL CITRATE 0.05 MG/ML IJ SOLN
INTRAMUSCULAR | Status: AC
  Filled 2012-12-18: qty 2

## 2012-12-18 MED ORDER — ONDANSETRON HCL 4 MG/2ML IJ SOLN
INTRAMUSCULAR | Status: DC | PRN
  Administered 2012-12-18: 4 mg via INTRAVENOUS

## 2012-12-18 MED ORDER — MEPERIDINE HCL 50 MG/ML IJ SOLN: 6.2500 mg | INTRAMUSCULAR | Status: DC | PRN

## 2012-12-18 MED ORDER — ONDANSETRON HCL 4 MG/2ML IJ SOLN: 4.0000 mg | Freq: Four times a day (QID) | INTRAMUSCULAR | Status: DC | PRN

## 2012-12-18 MED ORDER — PROPOFOL 10 MG/ML IV BOLUS
INTRAVENOUS | Status: DC | PRN
  Administered 2012-12-18: 200 mg via INTRAVENOUS

## 2012-12-18 MED ORDER — GLYCOPYRROLATE 0.2 MG/ML IJ SOLN
INTRAMUSCULAR | Status: DC | PRN
  Administered 2012-12-18: .6 mg via INTRAVENOUS

## 2012-12-18 MED ORDER — ALBUTEROL SULFATE HFA 108 (90 BASE) MCG/ACT IN AERS
2.0000 | INHALATION_SPRAY | Freq: Every day | RESPIRATORY_TRACT | Status: DC | PRN
  Filled 2012-12-18: qty 6.7

## 2012-12-18 MED ORDER — LACTATED RINGERS IV SOLN
INTRAVENOUS | Status: DC | PRN
  Administered 2012-12-18: 07:00:00 via INTRAVENOUS

## 2012-12-18 MED ORDER — FENTANYL CITRATE 0.05 MG/ML IJ SOLN
25.0000 ug | INTRAMUSCULAR | Status: DC | PRN
  Administered 2012-12-18: 50 ug via INTRAVENOUS
  Administered 2012-12-18: 25 ug via INTRAVENOUS
  Administered 2012-12-18 (×2): 50 ug via INTRAVENOUS

## 2012-12-18 MED ORDER — NEOSTIGMINE METHYLSULFATE 1 MG/ML IJ SOLN
INTRAMUSCULAR | Status: DC | PRN
  Administered 2012-12-18: 4 mg via INTRAVENOUS

## 2012-12-18 SURGICAL SUPPLY — 38 items
ATTRACTOMAT 16X20 MAGNETIC DRP (DRAPES) ×2 IMPLANT
BENZOIN TINCTURE PRP APPL 2/3 (GAUZE/BANDAGES/DRESSINGS) ×2 IMPLANT
BLADE HEX COATED 2.75 (ELECTRODE) ×2 IMPLANT
BLADE SURG 15 STRL LF DISP TIS (BLADE) ×1 IMPLANT
BLADE SURG 15 STRL SS (BLADE) ×1
CANISTER SUCTION 2500CC (MISCELLANEOUS) ×2 IMPLANT
CHLORAPREP W/TINT 10.5 ML (MISCELLANEOUS) ×2 IMPLANT
CLIP TI MEDIUM 6 (CLIP) ×6 IMPLANT
CLIP TI WIDE RED SMALL 6 (CLIP) ×8 IMPLANT
CLOTH BEACON ORANGE TIMEOUT ST (SAFETY) ×2 IMPLANT
DISSECTOR ROUND CHERRY 3/8 STR (MISCELLANEOUS) IMPLANT
DRAPE PED LAPAROTOMY (DRAPES) ×2 IMPLANT
DRESSING SURGICEL FIBRLLR 1X2 (HEMOSTASIS) IMPLANT
DRSG SURGICEL FIBRILLAR 1X2 (HEMOSTASIS)
ELECT REM PT RETURN 9FT ADLT (ELECTROSURGICAL) ×2
ELECTRODE REM PT RTRN 9FT ADLT (ELECTROSURGICAL) ×1 IMPLANT
GAUZE SPONGE 4X4 16PLY XRAY LF (GAUZE/BANDAGES/DRESSINGS) ×2 IMPLANT
GLOVE SURG ORTHO 8.0 STRL STRW (GLOVE) ×2 IMPLANT
GOWN STRL NON-REIN LRG LVL3 (GOWN DISPOSABLE) ×2 IMPLANT
GOWN STRL REIN XL XLG (GOWN DISPOSABLE) ×6 IMPLANT
KIT BASIN OR (CUSTOM PROCEDURE TRAY) ×2 IMPLANT
NS IRRIG 1000ML POUR BTL (IV SOLUTION) ×2 IMPLANT
PACK BASIC VI WITH GOWN DISP (CUSTOM PROCEDURE TRAY) ×2 IMPLANT
PENCIL BUTTON HOLSTER BLD 10FT (ELECTRODE) ×2 IMPLANT
SHEARS HARMONIC 9CM CVD (BLADE) ×2 IMPLANT
SPONGE GAUZE 4X4 12PLY (GAUZE/BANDAGES/DRESSINGS) ×2 IMPLANT
STAPLER VISISTAT 35W (STAPLE) ×2 IMPLANT
STRIP CLOSURE SKIN 1/2X4 (GAUZE/BANDAGES/DRESSINGS) ×2 IMPLANT
SUT MNCRL AB 4-0 PS2 18 (SUTURE) ×2 IMPLANT
SUT SILK 2 0 (SUTURE) ×1
SUT SILK 2-0 18XBRD TIE 12 (SUTURE) ×1 IMPLANT
SUT SILK 3 0 (SUTURE)
SUT SILK 3-0 18XBRD TIE 12 (SUTURE) IMPLANT
SUT VIC AB 3-0 SH 18 (SUTURE) ×4 IMPLANT
SYR BULB IRRIGATION 50ML (SYRINGE) ×2 IMPLANT
TAPE CLOTH SURG 4X10 WHT LF (GAUZE/BANDAGES/DRESSINGS) ×2 IMPLANT
TOWEL OR 17X26 10 PK STRL BLUE (TOWEL DISPOSABLE) ×2 IMPLANT
YANKAUER SUCT BULB TIP 10FT TU (MISCELLANEOUS) ×2 IMPLANT

## 2012-12-18 NOTE — Preoperative (Signed)
Beta Blockers   Reason not to administer Beta Blockers:Not Applicable 

## 2012-12-18 NOTE — Progress Notes (Signed)
PT verbalized clearly with slight rasp in PACU.

## 2012-12-18 NOTE — Anesthesia Preprocedure Evaluation (Addendum)
Anesthesia Evaluation  Patient identified by MRN, date of birth, ID band Patient awake    Reviewed: Allergy & Precautions, H&P , NPO status , Patient's Chart, lab work & pertinent test results  Airway Mallampati: II TM Distance: >3 FB Neck ROM: Full    Dental no notable dental hx.    Pulmonary asthma ,  breath sounds clear to auscultation  Pulmonary exam normal       Cardiovascular hypertension, Pt. on medications Rhythm:Regular Rate:Normal     Neuro/Psych negative neurological ROS  negative psych ROS   GI/Hepatic Neg liver ROS, GERD-  Medicated and Controlled,  Endo/Other  Morbid obesity  Renal/GU negative Renal ROS  negative genitourinary   Musculoskeletal negative musculoskeletal ROS (+)   Abdominal   Peds negative pediatric ROS (+)  Hematology negative hematology ROS (+)   Anesthesia Other Findings   Reproductive/Obstetrics negative OB ROS                          Anesthesia Physical Anesthesia Plan  ASA: II  Anesthesia Plan: General   Post-op Pain Management:    Induction: Intravenous  Airway Management Planned: Oral ETT  Additional Equipment:   Intra-op Plan:   Post-operative Plan: Extubation in OR  Informed Consent: I have reviewed the patients History and Physical, chart, labs and discussed the procedure including the risks, benefits and alternatives for the proposed anesthesia with the patient or authorized representative who has indicated his/her understanding and acceptance.   Dental advisory given  Plan Discussed with: CRNA  Anesthesia Plan Comments:         Anesthesia Quick Evaluation

## 2012-12-18 NOTE — Transfer of Care (Signed)
Immediate Anesthesia Transfer of Care Note  Patient: Emily Mathis  Procedure(s) Performed: Procedure(s): TOTAL THYROIDECTOMY (N/A)  Patient Location: PACU  Anesthesia Type:General  Level of Consciousness: awake, alert , oriented and patient cooperative  Airway & Oxygen Therapy: Patient Spontanous Breathing and Patient connected to face mask oxygen  Post-op Assessment: Report given to PACU RN, Post -op Vital signs reviewed and stable and Patient moving all extremities X 4  Post vital signs: Reviewed and stable  Complications: No apparent anesthesia complications

## 2012-12-18 NOTE — H&P (Signed)
Emily Mathis is an 47 y.o. female.    General Surgery [facility]  Chief Complaint:  Thyroid goiter, multinodular, with compressive symptoms  HPI: Patient is a 47 year old black female referred by her endocrinologist for consideration for total thyroidectomy for management of multinodular goiter with compressive symptoms. Patient has been followed with sequential ultrasound scanning showing a mild progressive enlargement of thyroid nodules. Previous fine-needle aspiration biopsy of the dominant 2.7 cm nodule in the left thyroid lobe showed benign cytopathology.  Patient mainly complains of compressive symptoms. She sleeps propped up on 3 pillows due to dyspnea when laying flat. She notes mild discomfort in the neck and a globus sensation. She complains of solid food dysphagia. She complains of tenderness and avoid tight-fitting clothing around the neck.  Patient has never been on thyroid medication. She has had no prior head or neck surgery. There is no family history of thyroid disease. There is no history of other endocrinopathy.  Past Medical History  Diagnosis Date  . Hyperlipidemia   . Anxiety   . Asthma   . Thyroid disease   . Arthritis   . Cancer     Pre-Cancer-Ovarian  . Hypertension     per Dr. Verl Dicker note 08/30/2011  . GERD (gastroesophageal reflux disease)   . Headache     Past Surgical History  Procedure Laterality Date  . Cesarean section  1995  . Cesarean section  1996  . Cardiac catheterization  2013    Family History  Problem Relation Age of Onset  . Heart disease Mother   . Stroke Mother   . Heart disease Father   . Colon cancer Father   . Breast cancer Paternal Aunt   . Cancer Paternal Grandmother   . Cancer Paternal Aunt    Social History:  reports that she has never smoked. She has never used smokeless tobacco. She reports that  drinks alcohol. She reports that she does not use illicit drugs.  Allergies:  Allergies  Allergen  Reactions  . Latex     Itchy & leaves marks on skin  . Dilaudid (Hydromorphone Hcl) Itching  . Hydrocodone Itching    Medications Prior to Admission  Medication Sig Dispense Refill  . acetaminophen (TYLENOL) 500 MG tablet Take 500 mg by mouth every 6 (six) hours as needed for pain.      Marland Kitchen albuterol (PROVENTIL HFA;VENTOLIN HFA) 108 (90 BASE) MCG/ACT inhaler Inhale 2 puffs into the lungs daily as needed. For shortness of breath      . omeprazole (PRILOSEC) 20 MG capsule Take 20 mg by mouth daily. Take 2 daily-patient been taking one when she needs it only.        No results found for this or any previous visit (from the past 48 hour(s)). No results found.  Review of Systems  Constitutional: Negative.   HENT: Negative.   Eyes: Negative.   Respiratory: Negative.   Cardiovascular: Negative.   Gastrointestinal: Negative.   Genitourinary: Negative.   Musculoskeletal: Negative.   Skin: Negative.   Neurological: Negative.   Endo/Heme/Allergies: Negative.   Psychiatric/Behavioral: Negative.     Blood pressure 153/97, pulse 98, temperature 97.9 F (36.6 C), temperature source Oral, resp. rate 20, last menstrual period 06/19/2012, SpO2 97.00%. Physical Exam  Constitutional: She is oriented to person, place, and time. She appears well-developed and well-nourished. No distress.  HENT:  Head: Normocephalic and atraumatic.  Right Ear: External ear normal.  Left Ear: External ear normal.  Mouth/Throat: Oropharynx is  clear and moist.  Eyes: Conjunctivae and EOM are normal. Pupils are equal, round, and reactive to light. No scleral icterus.  Neck: Normal range of motion. Neck supple. No tracheal deviation present. Thyromegaly present.  Cardiovascular: Normal rate, regular rhythm and normal heart sounds.   No murmur heard. Respiratory: Effort normal and breath sounds normal. She has no wheezes.  GI: Soft. Bowel sounds are normal. She exhibits no distension.  Musculoskeletal: Normal range  of motion. She exhibits no edema.  Lymphadenopathy:    She has no cervical adenopathy.  Neurological: She is alert and oriented to person, place, and time.  Skin: Skin is warm and dry.  Psychiatric: She has a normal mood and affect. Her behavior is normal.     Assessment/Plan Multinodulary thyroid goiter with compressive symptoms  Plan total thyroidectomy  The risks and benefits of the procedure have been discussed at length with the patient.  The patient understands the proposed procedure, potential alternative treatments, and the course of recovery to be expected.  All of the patient's questions have been answered at this time.  The patient wishes to proceed with surgery.  Velora Heckler, MD, FACS General & Endocrine Surgery [facility] Office: [phone]   Adan Baehr M 12/18/2012, 7:16 AM

## 2012-12-18 NOTE — Telephone Encounter (Signed)
[employer] lab slip mailed to pt to get calcium prior to po appt.

## 2012-12-18 NOTE — Anesthesia Procedure Notes (Signed)
Procedure Name: Intubation Date/Time: 12/18/2012 7:35 AM Performed by: Thornell Mule Pre-anesthesia Checklist: Patient identified, Emergency Drugs available, Suction available, Patient being monitored and Timeout performed Patient Re-evaluated:Patient Re-evaluated prior to inductionOxygen Delivery Method: Circle system utilized Preoxygenation: Pre-oxygenation with 100% oxygen Intubation Type: IV induction Ventilation: Mask ventilation without difficulty Laryngoscope Size: Miller and 3 Grade View: Grade II Tube type: Oral Tube size: 7.0 mm Number of attempts: 1 Airway Equipment and Method: LTA kit utilized and Stylet Placement Confirmation: ETT inserted through vocal cords under direct vision,  positive ETCO2 and breath sounds checked- equal and bilateral (no trauma) Secured at: 22 cm Tube secured with: Tape

## 2012-12-18 NOTE — Anesthesia Postprocedure Evaluation (Signed)
  Anesthesia Post-op Note  Patient: Emily Mathis  Procedure(s) Performed: Procedure(s) (LRB): TOTAL THYROIDECTOMY (N/A)  Patient Location: PACU  Anesthesia Type: General  Level of Consciousness: awake and alert   Airway and Oxygen Therapy: Patient Spontanous Breathing  Post-op Pain: mild  Post-op Assessment: Post-op Vital signs reviewed, Patient's Cardiovascular Status Stable, Respiratory Function Stable, Patent Airway and No signs of Nausea or vomiting  Last Vitals:  Filed Vitals:   12/18/12 0945  BP: 152/96  Pulse: 77  Temp:   Resp: 14    Post-op Vital Signs: stable   Complications: No apparent anesthesia complications

## 2012-12-18 NOTE — Brief Op Note (Signed)
12/18/2012  9:28 AM  PATIENT:  Emily Mathis  47 y.o. female  PRE-OPERATIVE DIAGNOSIS:  MULTI NODULAR GOITER WITH COMPRESSIVE SYMPTOMS  POST-OPERATIVE DIAGNOSIS:  MULTI NODULAR GOITER WITH COMPRESSIVE SYMPTOMS  PROCEDURE:  Procedure(s): TOTAL THYROIDECTOMY (N/A)  SURGEON:  Surgeon(s) and Role:    * Velora Heckler, MD - Primary    * Thomas A. Cornett, MD - Assisting  ANESTHESIA:   general  EBL:     BLOOD ADMINISTERED:none  DRAINS: none   LOCAL MEDICATIONS USED:  NONE  SPECIMEN:  Excision  DISPOSITION OF SPECIMEN:  PATHOLOGY  COUNTS:  YES  TOURNIQUET:  * No tourniquets in log *  DICTATION: .Other Dictation: Dictation Number [id number]  PLAN OF CARE: Admit for overnight observation  PATIENT DISPOSITION:  PACU - hemodynamically stable.   Delay start of Pharmacological VTE agent (>24hrs) due to surgical blood loss or risk of bleeding: yes  Velora Heckler, MD, FACS General & Endocrine Surgery [facility] Office: [phone]

## 2012-12-19 ENCOUNTER — Encounter (HOSPITAL_COMMUNITY): Payer: Self-pay | Admitting: Surgery

## 2012-12-19 LAB — BASIC METABOLIC PANEL
BUN: 9 mg/dL (ref 6–23)
Chloride: 94 mEq/L — ABNORMAL LOW (ref 96–112)
Creatinine, Ser: 0.71 mg/dL (ref 0.50–1.10)
GFR calc Af Amer: >90 mL/min (ref >90)
GFR calc non Af Amer: >90 mL/min (ref >90)
Potassium: 3.4 mEq/L — ABNORMAL LOW (ref 3.5–5.1)

## 2012-12-19 MED ORDER — DIPHENHYDRAMINE HCL 25 MG PO CAPS
25.0000 mg | ORAL_CAPSULE | Freq: Four times a day (QID) | ORAL | Status: DC | PRN
  Administered 2012-12-22 (×4): 25 mg via ORAL
  Filled 2012-12-19 (×4): qty 1

## 2012-12-19 MED ORDER — CALCIUM CARBONATE 1250 (500 CA) MG PO TABS
1250.0000 mg | ORAL_TABLET | Freq: Four times a day (QID) | ORAL | Status: DC
  Administered 2012-12-19 – 2012-12-20 (×5): 1250 mg via ORAL
  Filled 2012-12-19 (×10): qty 1

## 2012-12-19 MED ORDER — SODIUM CHLORIDE 0.9 % IV SOLN
1.0000 g | Freq: Once | INTRAVENOUS | Status: AC
  Administered 2012-12-19: 1 g via INTRAVENOUS
  Filled 2012-12-19: qty 10

## 2012-12-19 MED ORDER — SODIUM CHLORIDE 0.9 % IV SOLN
1.0000 g | INTRAVENOUS | Status: DC
  Filled 2012-12-19: qty 10

## 2012-12-19 MED ORDER — OXYCODONE-ACETAMINOPHEN 5-325 MG PO TABS
1.0000 | ORAL_TABLET | ORAL | Status: DC | PRN
  Administered 2012-12-20 – 2012-12-22 (×4): 1 via ORAL
  Administered 2012-12-22: 2 via ORAL
  Administered 2012-12-22: 1 via ORAL
  Administered 2012-12-22 – 2012-12-23 (×2): 2 via ORAL
  Filled 2012-12-19: qty 1
  Filled 2012-12-19: qty 2
  Filled 2012-12-19 (×3): qty 1
  Filled 2012-12-19 (×3): qty 2

## 2012-12-19 MED ORDER — SODIUM CHLORIDE 0.9 % IV SOLN
1.0000 g | INTRAVENOUS | Status: AC
  Administered 2012-12-19: 1 g via INTRAVENOUS
  Filled 2012-12-19: qty 10

## 2012-12-19 MED ORDER — CALCITRIOL 0.25 MCG PO CAPS
0.2500 ug | ORAL_CAPSULE | Freq: Every day | ORAL | Status: DC
  Administered 2012-12-19 – 2012-12-20 (×2): 0.25 ug via ORAL
  Filled 2012-12-19 (×3): qty 1

## 2012-12-19 NOTE — Progress Notes (Signed)
Patient ID: Emily Mathis, female   DOB: 06-Jan-1966, 47 y.o.   MRN: [id number]  General Surgery - [facility] - Progress Note  POD# 1  Subjective: Patient with less pain today.  Tolerating liquids.  Some tingling this AM around mouth.  Now resolved.  Objective: Vital signs in last 24 hours: Temp:  [97.4 F (36.3 C)-98.1 F (36.7 C)] 97.9 F (36.6 C) (04/04 1400) Pulse Rate:  [80-93] 80 (04/04 1400) Resp:  [18-20] 20 (04/04 1400) BP: (122-162)/(75-96) 122/75 mmHg (04/04 1400) SpO2:  [93 %-99 %] 99 % (04/04 1400) Last BM Date: 12/17/12  Intake/Output from previous day: 04/03 0701 - 04/04 0700 In: 1957.5 [I.V.:1957.5] Out: 2230 [Urine:2225; Blood:5]  Exam: HEENT - clear, not icteric Neck - wound clear and dry with mild STS; voice mildly hoarse; no stridor Chest - clear bilaterally Cor - RRR, no murmur Ext - no significant edema Neuro - grossly intact, no focal deficits  Lab Results:  No results found for this basename: WBC, HGB, HCT, PLT,  in the last 72 hours   Recent Labs  12/19/12 0420  NA 134*  K 3.4*  CL 94*  CO2 30  GLUCOSE 146*  BUN 9  CREATININE 0.71  CALCIUM 7.5*    Studies/Results: No results found.  Assessment / Plan: 1.  Status post total thyroidectomy  Moderate hypocalcemia - IV calcium gluconate given x 2 doses today  Increase po CaCO3  Add Rocaltrol  Begin po pain med - patient wants to try Percocet  Check labs in AM 4/5  Velora Heckler, MD, [facility] Office: [phone]  12/19/2012

## 2012-12-19 NOTE — Op Note (Signed)
NAME:  Emily Mathis, Emily Mathis               ACCOUNT NO.:  [id number]  MEDICAL RECORD NO.:  [id number]  LOCATION:  1539                         FACILITY:  [facility]  PHYSICIAN:  Velora Heckler, MD      DATE OF BIRTH:  11/29/1965  DATE OF PROCEDURE:  12/18/2012                               OPERATIVE REPORT   PREOPERATIVE DIAGNOSIS:  Multinodular thyroid goiter with compressive symptoms.  POSTOPERATIVE DIAGNOSIS:  Multinodular thyroid goiter with compressive symptoms.  PROCEDURE:  Total thyroidectomy.  SURGEON:  Velora Heckler, MD, FACS  ASSISTANT:  Clovis Pu. Cornett, MD, FACS  ANESTHESIA:  General.  ESTIMATED BLOOD LOSS:  Minimal.  PREPARATION:  ChloraPrep.  COMPLICATIONS:  None.  INDICATIONS:  The patient is a 47 year old black female referred by her endocrinologist for total thyroidectomy for management of multinodular goiter with compressive symptoms.  The patient had been found to have enlarging thyroid nodules.  Dominant nodule measured 2.7 cm in the left thyroid lobe.  The patient had noted compressive symptoms including dyspnea, neck discomfort, and globus sensation.  She has had mild solid food dysphagia.  She now comes to Surgery for thyroidectomy.  BODY OF REPORT:  Procedures done in OR #1 at the [facility].  The patient was brought to the operating room, placed in a supine position on the operating room table.  Following administration of general anesthesia, the patient was positioned and then prepped and draped in the usual strict aseptic fashion.  After ascertaining that an adequate level of anesthesia had been achieved, a Kocher incision was made with a #15 blade.  Dissection was carried through subcutaneous tissues and platysma.  Hemostasis was obtained with the electrocautery. Skin flaps were elevated cephalad and caudad from the thyroid notch to the sternal notch.  The Mahorner self-retaining retractor was placed for exposure.  Strap muscles were  incised in the midline.  Dissection was begun on the left.  Strap muscles were reflected laterally exposing a relatively small normal-appearing left thyroid lobe.  There is subtle nodularity.  Left lobe was gently mobilized with blunt dissection.  Venous tributaries were divided between Ligaclips with the Harmonic scalpel.  Gland was rolled anteriorly.  Branches of the inferior thyroid artery were divided between small Ligaclips.  Superior pole vessels were dissected out and divided individually between small and medium Ligaclips.  Dissection inferiorly shows a mass in the thyrothymic tract extending into the upper mediastinum.  This was gently mobilized.  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  It was gently mobilized.  Vascular structures were divided between Ligaclips with the Harmonic scalpel.  Tissue was delivered out of the chest and submitted separately, labeled ectopic thyroid tissue in left thyrothymic tract.  We turned our attention back to the left thyroid lobe.  Remaining branches of the inferior thyroid artery were divided between small Ligaclips.  Recurrent laryngeal nerve was identified and preserved. Superior parathyroid gland was dissected off the capsule of the thyroid and preserved on its vascular pedicle.  Ligament of Allyson Sabal was released with the electrocautery and the gland was mobilized up and  onto the anterior trachea.  Isthmus was mobilized across the midline.  A small pyramidal lobe was resected en bloc with the thyroid isthmus.  A dry pack was placed in the left neck.  Next, we turned our attention to the right thyroid lobe.  Again strap muscles were reflected laterally.  Right lobe was slightly larger and appears to be multinodular.  It was gently dissected out with blunt dissection.  Venous tributaries were divided between  Ligaclips with the Harmonic scalpel.  Inferior venous tributaries were divided between Ligaclips with the Harmonic scalpel.  The thyrothymic tract was explored and there was no ectopic tissue present.  Superior pole vessels were dissected out and divided individually between small and medium Ligaclips with the Harmonic scalpel.  Gland was rolled anteriorly. Parathyroid tissue was identified and preserved.  Recurrent laryngeal nerve was identified and preserved.  Remaining branches of the inferior thyroid artery were divided between small Ligaclips.  Ligament of Allyson Sabal was released with the electrocautery and the gland was rolled onto the anterior trachea from which it was completely resected with the electrocautery.  Suture was used to mark the left superior pole of the thyroid and the entire gland was submitted to Pathology for review.  The neck was irrigated with warm saline.  Good hemostasis was noted. Fibrillar was placed throughout the operative field.  Strap muscles were reapproximated in the midline with interrupted 3-0 Vicryl sutures. Platysma was closed with interrupted 3-0 Vicryl sutures.  Skin was closed with a running 4-0 Monocryl subcuticular suture.  Wound was washed and dried and benzoin and Steri-Strips were applied.  Sterile dressings were applied.  The patient was awakened from anesthesia and brought to the recovery room.  The patient tolerated the procedure well.   Velora Heckler, MD, FACS General & Endocrine Surgery [facility] Office: [phone]   TMG/MEDQ  D:  12/18/2012  T:  12/19/2012  Job:  [id number]  cc:   Jeannett Senior A. Evlyn Kanner, M.D. Fax: [phone]

## 2012-12-19 NOTE — Care Management Note (Signed)
    Page 1 of 1   12/19/2012     3:41:05 PM   CARE MANAGEMENT NOTE 12/19/2012  Patient:  [facility]   Account Number:  [id number]  Date Initiated:  12/19/2012  Documentation initiated by:  Lorenda Ishihara  Subjective/Objective Assessment:   47 yo female admitted s/p thyroidectomy. PTA lived at home with boyfriend.     Action/Plan:   Home when stable   Anticipated DC Date:  12/19/2012   Anticipated DC Plan:  HOME/SELF CARE      DC Planning Services  CM consult      Choice offered to / List presented to:             Status of service:  Completed, signed off Medicare Important Message given?   (If response is "NO", the following Medicare IM given date fields will be blank) Date Medicare IM given:   Date Additional Medicare IM given:    Discharge Disposition:  HOME/SELF CARE  Per UR Regulation:  Reviewed for med. necessity/level of care/duration of stay  If discussed at Long Length of Stay Meetings, dates discussed:    Comments:

## 2012-12-20 LAB — BASIC METABOLIC PANEL
CO2: 33 mEq/L — ABNORMAL HIGH (ref 19–32)
Calcium: 7.2 mg/dL — ABNORMAL LOW (ref 8.4–10.5)
Chloride: 97 mEq/L (ref 96–112)
Creatinine, Ser: 0.83 mg/dL (ref 0.50–1.10)
GFR calc Af Amer: >90 mL/min (ref >90)
Sodium: 138 mEq/L (ref 135–145)

## 2012-12-20 MED ORDER — SODIUM CHLORIDE 0.9 % IV SOLN
1.0000 g | Freq: Once | INTRAVENOUS | Status: DC
  Filled 2012-12-20: qty 10

## 2012-12-20 MED ORDER — SODIUM CHLORIDE 0.9 % IV SOLN
1.0000 g | INTRAVENOUS | Status: DC
  Filled 2012-12-20 (×3): qty 10

## 2012-12-20 MED ORDER — CALCIUM GLUCONATE 10 % IV SOLN
1.0000 g | INTRAVENOUS | Status: DC
  Filled 2012-12-20: qty 10

## 2012-12-20 MED ORDER — CALCIUM CARBONATE 1250 (500 CA) MG PO TABS
2.0000 | ORAL_TABLET | Freq: Once | ORAL | Status: AC
  Administered 2012-12-20: 1000 mg via ORAL
  Filled 2012-12-20: qty 2

## 2012-12-20 MED ORDER — SODIUM CHLORIDE 0.9 % IJ SOLN
10.0000 mL | INTRAMUSCULAR | Status: DC | PRN
  Administered 2012-12-23: 10 mL
  Administered 2012-12-23: 20 mL

## 2012-12-20 MED ORDER — SODIUM CHLORIDE 0.9 % IV SOLN
1.0000 g | INTRAVENOUS | Status: AC
  Administered 2012-12-20 (×3): 1 g via INTRAVENOUS

## 2012-12-20 NOTE — Progress Notes (Signed)
Call to room by pt. Pt crying. She states face and hands numbness "much worse" and that groin area  "Tight". Pt is able to move hands but states they "feel rigid". Emily Mathis Cn  in .and states she will notify Md of change.

## 2012-12-20 NOTE — Significant Event (Signed)
General Surgery [facility]  Patient experiencing paresthesias and carpal spasm due to hypocalcemia.  Have given extra dosage of oral calcium carbonate.  Already on Rocaltrol and qid calcium orally.  Ordered IV calcium gluconate 1 gm for 3 doses today early this AM.  Discussed with pharmacy.  Unfortunately IV access was lost this morning.  IV team unable to establish peripheral IV.  PICC line requested this morning but so far there has not been adequate staffing to provide this service for this patient.  Patient remains symptomatic but not in distress.  I will move patient to stepdown for monitoring - will be able to give calcium IV at faster rate.  If IV team cannot place PICC line soon, will be forced to place subclavian central line.  Risk is increased due to patient's body habitus and risk of pneumothorax.  Discussed with patient at bedside and her family by telephone.  Patient would prefer PICC line if possible.  Will transfer to stepdown and monitor status.  Await PICC placement.  Velora Heckler, MD, [facility] Office: [phone]

## 2012-12-20 NOTE — Progress Notes (Signed)
Patient ID: Emily Mathis, female   DOB: 16-Aug-1966, 47 y.o.   MRN: [id number]  General Surgery - [facility] - Progress Note  POD# 2  Subjective: Patient with tingling in hands and face this morning.  No muscle spasms.  Pain better controlled with Percocet.  Family at bedside.  Objective: Vital signs in last 24 hours: Temp:  [97.4 F (36.3 C)-99.3 F (37.4 C)] 98.4 F (36.9 C) (04/05 0158) Pulse Rate:  [79-106] 89 (04/05 0158) Resp:  [18-20] 18 (04/05 0158) BP: (119-138)/(73-96) 119/77 mmHg (04/05 0158) SpO2:  [97 %-99 %] 98 % (04/05 0158) Last BM Date: 12/17/12  Intake/Output from previous day: 04/04 0701 - 04/05 0700 In: 2384.2 [P.O.:1060; I.V.:1214.2; IV Piggyback:110] Out: 1700 [Urine:1700]  Exam: HEENT - clear, not icteric Neck - soft, mild STS; wound clear and dry; voice slightly hoarse Chest - clear bilaterally Cor - RRR, no murmur Abd - soft without distension Ext - no significant edema Neuro - grossly intact, no focal deficits; positive Chovstek's sign bilaterally  Lab Results:  No results found for this basename: WBC, HGB, HCT, PLT,  in the last 72 hours   Recent Labs  12/19/12 0420 12/20/12 0422  NA 134* 138  K 3.4* 3.1*  CL 94* 97  CO2 30 33*  GLUCOSE 146* 144*  BUN 9 10  CREATININE 0.71 0.83  CALCIUM 7.5* 7.2*    Studies/Results: No results found.  Assessment / Plan: 1.  Status post total thyroidectomy  Final pathology benign, one parathyroid in specimen  Hypocalcemia - taking po calcium, calcitriol; will give IV calcium today x 3  BMET in AM 4/6  Admit per requirements  Percocet for pain  Velora Heckler, MD, [facility] Office: [phone]  12/20/2012

## 2012-12-20 NOTE — Progress Notes (Signed)
Transferred via bed to step down for further monitoring per dr gerkin's order. Report call to [facility]

## 2012-12-20 NOTE — Progress Notes (Signed)
Dr. Gerrit Friends notified and made aware of pt status. No new orders. MD instructed to notify [facility] in attempt to expedite PICC line placement with IV team and keep him informed.

## 2012-12-20 NOTE — Progress Notes (Signed)
Peripherally Inserted Central Catheter/Midline Placement  The IV Nurse has discussed with the patient and/or persons authorized to consent for the patient, the purpose of this procedure and the potential benefits and risks involved with this procedure.  The benefits include less needle sticks, lab draws from the catheter and patient may be discharged home with the catheter.  Risks include, but not limited to, infection, bleeding, blood clot (thrombus formation), and puncture of an artery; nerve damage and irregular heat beat.  Alternatives to this procedure were also discussed.  PICC/Midline Placement Documentation  PICC / Midline Double Lumen 12/20/12 PICC Right Basilic (Active)  Indication for Insertion or Continuance of Line Poor Vasculature-patient has had multiple peripheral attempts or PIVs lasting less than 24 hours 12/20/2012  8:40 PM  Length mark (cm) 2 cm 12/20/2012  8:40 PM  Site Assessment Clean;Dry;Intact 12/20/2012  8:40 PM  Lumen #1 Status Flushed;Saline locked;Blood return noted 12/20/2012  8:40 PM  Lumen #2 Status Flushed;Saline locked;Blood return noted 12/20/2012  8:40 PM  Dressing Type Transparent 12/20/2012  8:40 PM  Dressing Status Clean;Dry;Intact;Antimicrobial disc in place 12/20/2012  8:40 PM  Dressing Intervention New dressing 12/20/2012  8:40 PM  Dressing Change Due 12/27/12 12/20/2012  8:40 PM       Geoffery Spruce 12/20/2012, 8:41 PM

## 2012-12-20 NOTE — Progress Notes (Signed)
Patient ID: Emily Mathis, female   DOB: [birth year], 47 y.o.   MRN: [id number]  General Surgery [facility]  Patient in stepdown unit.  More comfortable and less symptomatic after additional po calcium and milk and yogurt.  Still awaiting IV team nurse for PICC line.  Reportedly on way from [facility] now.  Will hold off on central line in hopes of getting PICC placed within next hour.  Velora Heckler, MD, [facility] Office: [phone]

## 2012-12-20 NOTE — Progress Notes (Signed)
Spoke with Dr Gerrit Friends..made aware pt 's picc has not been placed yet-still unable to give iv calcium. Orders noted

## 2012-12-20 NOTE — Progress Notes (Signed)
12/20/12 Utilization Review completed.Olegario Messier Anahid Eskelson RN,BSN NCM [phone].

## 2012-12-21 LAB — BASIC METABOLIC PANEL
CO2: 32 mEq/L (ref 19–32)
Calcium: 7.4 mg/dL — ABNORMAL LOW (ref 8.4–10.5)
Chloride: 97 mEq/L (ref 96–112)
Chloride: 95 mEq/L — ABNORMAL LOW (ref 96–112)
Creatinine, Ser: 0.7 mg/dL (ref 0.50–1.10)
GFR calc Af Amer: >90 mL/min (ref >90)
GFR calc Af Amer: >90 mL/min (ref >90)
GFR calc non Af Amer: >90 mL/min (ref >90)
Sodium: 137 mEq/L (ref 135–145)

## 2012-12-21 LAB — GLUCOSE, CAPILLARY: Glucose-Capillary: 116 mg/dL — ABNORMAL HIGH (ref 70–99)

## 2012-12-21 LAB — MAGNESIUM: Magnesium: 1.6 mg/dL (ref 1.5–2.5)

## 2012-12-21 LAB — ALBUMIN: Albumin: 3 g/dL — ABNORMAL LOW (ref 3.5–5.2)

## 2012-12-21 MED ORDER — CALCIUM GLUCONATE 10 % IV SOLN
1.0000 g | INTRAVENOUS | Status: AC
  Administered 2012-12-21 – 2012-12-22 (×3): 1 g via INTRAVENOUS
  Filled 2012-12-21 (×3): qty 10

## 2012-12-21 MED ORDER — CALCITRIOL 0.25 MCG PO CAPS
0.2500 ug | ORAL_CAPSULE | Freq: Two times a day (BID) | ORAL | Status: DC
  Administered 2012-12-21 – 2012-12-23 (×5): 0.25 ug via ORAL
  Filled 2012-12-21 (×6): qty 1

## 2012-12-21 MED ORDER — SODIUM CHLORIDE 0.9 % IV SOLN
1.0000 g | INTRAVENOUS | Status: AC
  Administered 2012-12-21 (×3): 1 g via INTRAVENOUS
  Filled 2012-12-21 (×4): qty 10

## 2012-12-21 MED ORDER — POTASSIUM CHLORIDE 10 MEQ/50ML IV SOLN
10.0000 meq | INTRAVENOUS | Status: AC
  Administered 2012-12-21 (×4): 10 meq via INTRAVENOUS
  Filled 2012-12-21: qty 200

## 2012-12-21 MED ORDER — LEVOTHYROXINE SODIUM 100 MCG PO TABS
100.0000 ug | ORAL_TABLET | Freq: Every day | ORAL | Status: DC
  Administered 2012-12-22 – 2012-12-23 (×2): 100 ug via ORAL
  Filled 2012-12-21 (×3): qty 1

## 2012-12-21 MED ORDER — MAGNESIUM SULFATE 40 MG/ML IJ SOLN
2.0000 g | Freq: Once | INTRAMUSCULAR | Status: AC
  Administered 2012-12-21: 2 g via INTRAVENOUS
  Filled 2012-12-21: qty 50

## 2012-12-21 MED ORDER — CALCIUM CARBONATE 1250 (500 CA) MG PO TABS
3.0000 | ORAL_TABLET | Freq: Four times a day (QID) | ORAL | Status: DC
  Administered 2012-12-21 – 2012-12-23 (×10): 1500 mg via ORAL
  Filled 2012-12-21 (×12): qty 3

## 2012-12-21 NOTE — Progress Notes (Signed)
Patient ID: Emily Mathis, female   DOB: [birth year], 47 y.o.   MRN: [id number]  General Surgery - [facility] - Progress Note  POD# 3  Subjective: Patient in stepdown unit.  Comfortable this AM.  No paresthesias at present.  No spasm at present.  Tolerating diet.  Objective: Vital signs in last 24 hours: Temp:  [97.8 F (36.6 C)-99.2 F (37.3 C)] 99.2 F (37.3 C) (04/06 0400) Pulse Rate:  [81-109] 82 (04/06 0400) Resp:  [15-21] 17 (04/06 0400) BP: (118-159)/(49-90) 125/57 mmHg (04/06 0400) SpO2:  [96 %-100 %] 100 % (04/06 0400) Weight:  [280 lb 13.9 oz (127.4 kg)] 280 lb 13.9 oz (127.4 kg) (04/06 0400) Last BM Date: 12/17/12  Intake/Output from previous day: 04/05 0701 - 04/06 0700 In: 1325 [P.O.:540; I.V.:375; IV Piggyback:330] Out: 1150 [Urine:1150]  Exam: HEENT - clear, not icteric Neck - incision clear and dry and intact; mild STS; voice slightly hoarse Chest - clear bilaterally Cor - RRR, no murmur Abd - soft, obese Ext - no significant edema Neuro - grossly intact, no focal deficits; Chvostek's still present  Lab Results:  No results found for this basename: WBC, HGB, HCT, PLT,  in the last 72 hours   Recent Labs  12/20/12 0422 12/21/12 0520  NA 138 137  K 3.1* 3.1*  CL 97 97  CO2 33* 32  GLUCOSE 144* 118*  BUN 10 9  CREATININE 0.83 0.72  CALCIUM 7.2* 7.4*    Studies/Results: No results found.  Assessment / Plan: 1.  Status post total thyroidectomy  Begin po Synthroid 2.  Post op hypocalcemia  IV calcium gluconate this AM x 3 doses  Increase po calcium carbonate  Increase Rocaltrol to BID  Give magnesium today 3.  Hypokalemia  KCL x 3 runs this AM Will keep in stepdown unit today for monitoring and administration of IV calcium and magnesium and potassium.  Patient in good spirits and doing well.  Understands situation.  Velora Heckler, MD, [facility] Office: [phone]  12/21/2012

## 2012-12-22 ENCOUNTER — Telehealth (INDEPENDENT_AMBULATORY_CARE_PROVIDER_SITE_OTHER): Payer: Self-pay

## 2012-12-22 LAB — BASIC METABOLIC PANEL
BUN: 6 mg/dL (ref 6–23)
BUN: 8 mg/dL (ref 6–23)
Calcium: 8.1 mg/dL — ABNORMAL LOW (ref 8.4–10.5)
Calcium: 7.3 mg/dL — ABNORMAL LOW (ref 8.4–10.5)
Chloride: 95 mEq/L — ABNORMAL LOW (ref 96–112)
Chloride: 95 mEq/L — ABNORMAL LOW (ref 96–112)
Creatinine, Ser: 0.63 mg/dL (ref 0.50–1.10)
Creatinine, Ser: 0.81 mg/dL (ref 0.50–1.10)
GFR calc Af Amer: >90 mL/min (ref >90)
GFR calc Af Amer: >90 mL/min (ref >90)
GFR calc non Af Amer: >90 mL/min (ref >90)
GFR calc non Af Amer: 86 mL/min — ABNORMAL LOW (ref >90)

## 2012-12-22 LAB — MAGNESIUM: Magnesium: 1.6 mg/dL (ref 1.5–2.5)

## 2012-12-22 MED ORDER — MAGNESIUM SULFATE 40 MG/ML IJ SOLN
2.0000 g | Freq: Once | INTRAMUSCULAR | Status: AC
  Administered 2012-12-22: 2 g via INTRAVENOUS
  Filled 2012-12-22: qty 50

## 2012-12-22 NOTE — Progress Notes (Signed)
Patient ID: Emily Mathis, female   DOB: [birth year], 47 y.o.   MRN: [id number]  General Surgery - [facility] - Progress Note  POD# 4  Subjective: Patient more comfortable.  Family at bedside.  No paresthesias or spasms.  Tolerating diet.  Family says voice is about normal.  Objective: Vital signs in last 24 hours: Temp:  [97.7 F (36.5 C)-98.9 F (37.2 C)] 98 F (36.7 C) (04/07 1200) Pulse Rate:  [80-100] 85 (04/07 0800) Resp:  [13-21] 17 (04/07 0800) BP: (123-151)/(69-95) 123/73 mmHg (04/07 0800) SpO2:  [95 %-99 %] 95 % (04/07 0800) Weight:  [270 lb 15.1 oz (122.9 kg)] 270 lb 15.1 oz (122.9 kg) (04/07 0230) Last BM Date: 12/17/12  Intake/Output from previous day: 04/06 0701 - 04/07 0700 In: 2411.7 [P.O.:600; I.V.:1121.7; IV Piggyback:580] Out: 3425 [Urine:3425]  Exam: HEENT - clear, not icteric Neck - incision clear and dry; minimal STS Chest - clear bilaterally Cor - RRR, no murmur Abd - obese, soft Ext - no significant edema Neuro - grossly intact, no focal deficits  Lab Results:  No results found for this basename: WBC, HGB, HCT, PLT,  in the last 72 hours   Recent Labs  12/21/12 1715 12/22/12 0530  NA 137 137  K 3.4* 3.8  CL 95* 95*  CO2 32 34*  GLUCOSE 107* 123*  BUN 7 6  CREATININE 0.70 0.63  CALCIUM 7.5* 8.1*    Studies/Results: No results found.  Assessment / Plan: 1.  Status post total thyroidectomy  Hypocalcemia improving - 8.1 this AM  Will check calcium this afternoon and tomorrow AM  Potassium and magnesium levels now normal  Will transfer to [address] [place] today  Velora Heckler, MD, [facility] Office: [phone]  12/22/2012

## 2012-12-22 NOTE — Telephone Encounter (Signed)
I called pt to give po appt and path results. Pt states she is still in hospital but doing well and will keep f/u appt. Pt advised I mailed her a lab slip to get labs prior to appt.

## 2012-12-23 ENCOUNTER — Telehealth (INDEPENDENT_AMBULATORY_CARE_PROVIDER_SITE_OTHER): Payer: Self-pay | Admitting: General Surgery

## 2012-12-23 LAB — BASIC METABOLIC PANEL
Calcium: 7.5 mg/dL — ABNORMAL LOW (ref 8.4–10.5)
Creatinine, Ser: 0.71 mg/dL (ref 0.50–1.10)
GFR calc Af Amer: >90 mL/min (ref >90)
GFR calc non Af Amer: >90 mL/min (ref >90)
Sodium: 136 mEq/L (ref 135–145)

## 2012-12-23 MED ORDER — OXYCODONE-ACETAMINOPHEN 5-325 MG PO TABS: 1.0000 | ORAL_TABLET | ORAL | Status: DC | PRN

## 2012-12-23 MED ORDER — CALCIUM CARBONATE 1250 (500 CA) MG PO TABS: 3.0000 | ORAL_TABLET | Freq: Four times a day (QID) | ORAL | Status: DC

## 2012-12-23 MED ORDER — CALCITRIOL 0.25 MCG PO CAPS: 0.2500 ug | ORAL_CAPSULE | Freq: Two times a day (BID) | ORAL | Status: DC

## 2012-12-23 MED ORDER — HEPARIN SOD (PORK) LOCK FLUSH 100 UNIT/ML IV SOLN
250.0000 [IU] | INTRAVENOUS | Status: AC | PRN
  Administered 2012-12-23 (×2): 250 [IU]

## 2012-12-23 NOTE — Discharge Summary (Signed)
Physician Discharge Summary [facility]  Patient ID: Emily Mathis MRN: [id number] DOB/AGE: [birth year] 47 y.o.  Admit date: 12/18/2012 Discharge date: 12/23/2012  Admission Diagnoses:  Multinodular goiter with compressive symptoms  Discharge Diagnoses:  Principal Problem:   Multinodular goiter (nontoxic)   Discharged Condition: good  Hospital Course: patient admitted following total thyroidectomy for multinodular goiter with substernal component.  Post op course complicated by hypocalcemia.  Patient required aggressive supplementation with oral calcium carbonate, calcitriol, IV calcium gluconate, IV magnesium, and KCl.  She was monitored in the stepdown unit while significantly symptomatic from hypocalcemia.  Patient stabilized with serum calcium level about 7.5 at the time of discharge.  Will leave PICC line in place for 3 days in case patient becomes significantly symptomatic again.  Otherwise, the PICC line will be removed at the [facility] office on Friday, 12/26/2012.  Consults: None  Significant Diagnostic Studies: labs: calcium levels  Treatments: IV hydration, IV electrolyte administration  Discharge Exam: Blood pressure 144/77, pulse 78, temperature 98.2 F (36.8 C), temperature source Oral, resp. rate 18, height 5\' 3"  (1.6 m), weight 270 lb 15.1 oz (122.9 kg), last menstrual period 06/19/2012, SpO2 96.00%. HEENT - clear Neck - wound dry and intact with minimal STS; voice with mild hoarseness - variable Chest - clear Cor - RRR  Disposition: Home with family  Discharge Orders   Future Appointments Provider Department Dept Phone   01/05/2013 2:00 PM Velora Heckler, MD [facility], [place] [phone]   Future Orders Complete By Expires     Diet - low sodium heart healthy  As directed     Discharge instructions  As directed     Comments:      THYROID & PARATHYROID SURGERY - POST OP INSTRUCTIONS  Always review your discharge instruction sheet from the  facility where your surgery was performed.  A prescription for pain medication may be given to you upon discharge.  Take your pain medication as prescribed.  If narcotic pain medicine is not needed, then you may take acetaminophen (Tylenol) or ibuprofen (Advil) as needed.  Take your usually prescribed medications unless otherwise directed.  If you need a refill on your pain medication, please contact your pharmacy. They will contact our office to request authorization.  Prescriptions will not be processed after 5 pm or on weekends.  Start with a light diet upon arrival home, such as soup and crackers or toast.  Be sure to drink plenty of fluids daily.  Resume your normal diet the day after surgery.  Most patients will experience some swelling and bruising on the chest and neck area.  Ice packs will help.  Swelling and bruising can take several days to resolve.   It is common to experience some constipation if taking pain medication after surgery.  Increasing fluid intake and taking a stool softener will usually help or prevent this problem.  A mild laxative (Milk of Magnesia or Miralax) should be taken according to package directions if there are no bowel movements after 48 hours.  You may remove your bandages 24-48 hours after surgery, and you may shower at that time.  You have steri-strips (small skin tapes) in place directly over the incision.  These strips should be left on the skin for 7-10 days and then removed.  You may resume regular (light) daily activities beginning the next day-such as daily self-care, walking, climbing stairs-gradually increasing activities as tolerated.  You may have sexual intercourse when it is comfortable.  Refrain  from any heavy lifting or straining until approved by your doctor.  You may drive when you no longer are taking prescription pain medication, you can comfortably wear a seatbelt, and you can safely maneuver your car and apply brakes.  You should see your  doctor in the office for a follow-up appointment approximately two to three weeks after your surgery.  Make sure that you call for this appointment within a day or two after you arrive home to insure a convenient appointment time.  WHEN TO CALL YOUR DOCTOR: -- Fever greater than 101.5 -- Inability to urinate -- Nausea and/or vomiting - persistent -- Extreme swelling or bruising -- Continued bleeding from incision -- Increased pain, redness, or drainage from the incision -- Difficulty swallowing or breathing -- Muscle cramping or spasms -- Numbness or tingling in hands or around lips  The clinic staff is available to answer your questions during regular business hours.  Please don't hesitate to call and ask to speak to one of the nurses if you have concerns.  Velora Heckler, MD, FACS General & Endocrine Surgery [facility] Office: [phone]    Increase activity slowly  As directed     No dressing needed  As directed         Medication List    TAKE these medications       acetaminophen 500 MG tablet  Commonly known as:  TYLENOL  Take 500 mg by mouth every 6 (six) hours as needed for pain.     albuterol 108 (90 BASE) MCG/ACT inhaler  Commonly known as:  PROVENTIL HFA;VENTOLIN HFA  Inhale 2 puffs into the lungs daily as needed. For shortness of breath     calcitRIOL 0.25 MCG capsule  Commonly known as:  ROCALTROL  Take 1 capsule (0.25 mcg total) by mouth 2 (two) times daily.     calcium carbonate 1250 MG tablet  Commonly known as:  OS-CAL - dosed in mg of elemental calcium  Take 3 tablets (1,500 mg of elemental calcium total) by mouth 4 (four) times daily.     omeprazole 20 MG capsule  Commonly known as:  PRILOSEC  Take 20 mg by mouth daily. Take 2 daily-patient been taking one when she needs it only.     oxyCODONE-acetaminophen 5-325 MG per tablet  Commonly known as:  PERCOCET/ROXICET  Take 1-2 tablets by mouth every 4 (four) hours as needed.          Velora Heckler, MD, [facility] Office: [phone]   Signed: Velora Heckler 12/23/2012, 12:38 PM

## 2012-12-23 NOTE — Progress Notes (Signed)
Discharge instructions reviewed with patient and family, patient is to follow up with MD Gerkin to have picc line removed Friday 12-26-2012, calcium supplements to be picked up at local pharmacy were called in by MD Gerkin, prescription given to patient for pain, questions and concerns answered, vital sings are stable, incision within normal limits, will continue to monitor Stanford Breed RN 12-23-2012 13:04pm

## 2012-12-23 NOTE — Telephone Encounter (Signed)
Pt calling for appt Friday with Dr. Gerrit Friends to evaluate and possibly pull her PICC line.  Paged and discussed with Dr. Gerrit Friends; he is not in the office on Friday.  Dr. Gerrit Friends stated he and his nurse will discuss this in the morning and will contact her with the appt then.  Pt was likewise updated.

## 2012-12-24 ENCOUNTER — Emergency Department (HOSPITAL_COMMUNITY)
Admission: EM | Admit: 2012-12-24 | Discharge: 2012-12-25 | Disposition: A | Discharge status: Home / Self Care | Payer: BC Managed Care – PPO | Attending: Emergency Medicine | Admitting: Emergency Medicine

## 2012-12-24 ENCOUNTER — Emergency Department (HOSPITAL_COMMUNITY): Payer: BC Managed Care – PPO

## 2012-12-24 ENCOUNTER — Encounter (HOSPITAL_COMMUNITY): Payer: Self-pay

## 2012-12-24 DIAGNOSIS — Y92009 Unspecified place in unspecified non-institutional (private) residence as the place of occurrence of the external cause: Secondary | ICD-10-CM | POA: Insufficient documentation

## 2012-12-24 DIAGNOSIS — E785 Hyperlipidemia, unspecified: Secondary | ICD-10-CM | POA: Insufficient documentation

## 2012-12-24 DIAGNOSIS — I10 Essential (primary) hypertension: Secondary | ICD-10-CM | POA: Insufficient documentation

## 2012-12-24 DIAGNOSIS — Z8742 Personal history of other diseases of the female genital tract: Secondary | ICD-10-CM | POA: Insufficient documentation

## 2012-12-24 DIAGNOSIS — E079 Disorder of thyroid, unspecified: Secondary | ICD-10-CM | POA: Insufficient documentation

## 2012-12-24 DIAGNOSIS — Z79899 Other long term (current) drug therapy: Secondary | ICD-10-CM | POA: Insufficient documentation

## 2012-12-24 DIAGNOSIS — M1990 Unspecified osteoarthritis, unspecified site: Secondary | ICD-10-CM | POA: Insufficient documentation

## 2012-12-24 DIAGNOSIS — X500XXA Overexertion from strenuous movement or load, initial encounter: Secondary | ICD-10-CM | POA: Insufficient documentation

## 2012-12-24 DIAGNOSIS — Z8659 Personal history of other mental and behavioral disorders: Secondary | ICD-10-CM | POA: Insufficient documentation

## 2012-12-24 DIAGNOSIS — K219 Gastro-esophageal reflux disease without esophagitis: Secondary | ICD-10-CM | POA: Insufficient documentation

## 2012-12-24 DIAGNOSIS — Z8679 Personal history of other diseases of the circulatory system: Secondary | ICD-10-CM | POA: Insufficient documentation

## 2012-12-24 DIAGNOSIS — Y9389 Activity, other specified: Secondary | ICD-10-CM | POA: Insufficient documentation

## 2012-12-24 DIAGNOSIS — T148XXA Other injury of unspecified body region, initial encounter: Secondary | ICD-10-CM | POA: Insufficient documentation

## 2012-12-24 DIAGNOSIS — R079 Chest pain, unspecified: Secondary | ICD-10-CM

## 2012-12-24 DIAGNOSIS — J45909 Unspecified asthma, uncomplicated: Secondary | ICD-10-CM | POA: Insufficient documentation

## 2012-12-24 LAB — CBC
HCT: 38.6 % (ref 36.0–46.0)
MCHC: 33.2 g/dL (ref 30.0–36.0)
Platelets: 408 10*3/uL — ABNORMAL HIGH (ref 150–400)
RDW: 13.8 % (ref 11.5–15.5)
WBC: 10 10*3/uL (ref 4.0–10.5)

## 2012-12-24 LAB — POCT I-STAT TROPONIN I: Troponin i, poc: 0 ng/mL (ref 0.00–0.08)

## 2012-12-24 LAB — BASIC METABOLIC PANEL
BUN: 10 mg/dL (ref 6–23)
Chloride: 95 mEq/L — ABNORMAL LOW (ref 96–112)
Creatinine, Ser: 0.73 mg/dL (ref 0.50–1.10)
GFR calc Af Amer: >90 mL/min (ref >90)
GFR calc non Af Amer: >90 mL/min (ref >90)
Potassium: 3.5 mEq/L (ref 3.5–5.1)

## 2012-12-24 MED ORDER — SODIUM CHLORIDE 0.9 % IV BOLUS (SEPSIS)
1000.0000 mL | Freq: Once | INTRAVENOUS | Status: AC
  Administered 2012-12-25: 1000 mL via INTRAVENOUS

## 2012-12-24 MED ORDER — IOHEXOL 350 MG/ML SOLN
100.0000 mL | Freq: Once | INTRAVENOUS | Status: AC | PRN
  Administered 2012-12-24: 100 mL via INTRAVENOUS

## 2012-12-24 MED ORDER — MORPHINE SULFATE 4 MG/ML IJ SOLN
4.0000 mg | Freq: Once | INTRAMUSCULAR | Status: AC
  Administered 2012-12-25: 4 mg via INTRAVENOUS
  Filled 2012-12-24: qty 1

## 2012-12-24 NOTE — ED Provider Notes (Signed)
History     CSN: [id number]  Arrival date & time 12/24/12  2139   First MD Initiated Contact with Patient 12/24/12 2144      Chief Complaint  Patient presents with  . Chest Pain   HPI  History provided by the patient. Patient is a 47 year old female with history of hypertension, hyperlipidemia, hyperthyroidism with recent thyroidectomy one week ago by Dr. Ricky Stabs who presents with complaints of right-sided chest pain radiating up into the neck area. Patient states she was instructed to rest and take things easy with out strenuous activity or heavy lifting. She does admit that she was doing housework and reaching above her head prior to the onset of symptoms. Pain is sharp and burning in nature and is worse with deep breathing. Patient took her oxycodone without any relief. She denies any other recent changes or complaints. No fever, chills or sweats. No shortness of breath. No nausea vomiting.    Past Medical History  Diagnosis Date  . Hyperlipidemia   . Asthma   . Thyroid disease   . Arthritis   . Hypertension     per Dr. Verl Dicker note 08/30/2011  . Anxiety   . GERD (gastroesophageal reflux disease)   . Headache   . Cancer     Pre-Cancer-Ovarian    Past Surgical History  Procedure Laterality Date  . Cesarean section  1995  . Cesarean section  1996  . Cardiac catheterization  2013  . Thyroidectomy N/A 12/18/2012    Procedure: TOTAL THYROIDECTOMY;  Surgeon: Velora Heckler, MD;  Location: [facility];  Service: General;  Laterality: N/A;    Family History  Problem Relation Age of Onset  . Heart disease Mother   . Stroke Mother   . Heart disease Father   . Colon cancer Father   . Breast cancer Paternal Aunt   . Cancer Paternal Grandmother   . Cancer Paternal Aunt     History  Substance Use Topics  . Smoking status: Never Smoker   . Smokeless tobacco: Never Used  . Alcohol Use: Yes     Comment: Occasional    OB History   Grav Para Term Preterm Abortions TAB SAB Ect  Mult Living                  Review of Systems  Constitutional: Negative for fever, chills and diaphoresis.  Respiratory: Negative for shortness of breath.   Cardiovascular: Positive for chest pain. Negative for palpitations and leg swelling.  Gastrointestinal: Negative for nausea, vomiting, diarrhea and constipation.  All other systems reviewed and are negative.    Allergies  Latex; Dilaudid; and Hydrocodone  Home Medications   Current Outpatient Rx  Name  Route  Sig  Dispense  Refill  . albuterol (PROVENTIL HFA;VENTOLIN HFA) 108 (90 BASE) MCG/ACT inhaler   Inhalation   Inhale 2 puffs into the lungs daily as needed. For shortness of breath         . calcitRIOL (ROCALTROL) 0.25 MCG capsule   Oral   Take 1 capsule (0.25 mcg total) by mouth 2 (two) times daily.   30 capsule   1   . calcium carbonate (OS-CAL - DOSED IN MG OF ELEMENTAL CALCIUM) 1250 MG tablet   Oral   Take 3 tablets (1,500 mg of elemental calcium total) by mouth 4 (four) times daily.   120 tablet   3   . omeprazole (PRILOSEC) 20 MG capsule   Oral   Take 20 mg by mouth daily.  Take 2 daily-patient been taking one when she needs it only.         Marland Kitchen oxyCODONE-acetaminophen (PERCOCET/ROXICET) 5-325 MG per tablet   Oral   Take 1-2 tablets by mouth every 4 (four) hours as needed for pain.           BP 161/88  Pulse 110  Temp(Src) 98.8 F (37.1 C) (Oral)  Resp 22  SpO2 97%  LMP 07/14/2012  Physical Exam  Nursing note and vitals reviewed. Constitutional: She is oriented to person, place, and time. She appears well-developed and well-nourished. No distress.  HENT:  Head: Normocephalic.  Neck: Normal range of motion. Neck supple.  Incisional scar across lower anterior neck with Steri-Strips in place. Incision is well healed with out leading or discharge. No erythema or swelling around the skin. No other swelling or mass in the neck. There is some tenderness to palpation along the inferior incision  and into the sternal area of the chest. No deformities.  Cardiovascular: Normal rate and regular rhythm.   Pulmonary/Chest: Effort normal and breath sounds normal. No stridor. No respiratory distress. She has no wheezes. She has no rales.  Abdominal: Soft. There is no tenderness.  Musculoskeletal: Normal range of motion. She exhibits no edema and no tenderness.  No clinical signs of DVT  Neurological: She is alert and oriented to person, place, and time.  Skin: Skin is warm and dry. No rash noted.  Psychiatric: She has a normal mood and affect. Her behavior is normal.    ED Course  Procedures   Results for orders placed during the hospital encounter of 12/24/12  CBC      Result Value Range   WBC 10.0  4.0 - 10.5 K/uL   RBC 4.26  3.87 - 5.11 MIL/uL   Hemoglobin 12.8  12.0 - 15.0 g/dL   HCT 16.1  09.6 - 04.5 %   MCV 90.6  78.0 - 100.0 fL   MCH 30.0  26.0 - 34.0 pg   MCHC 33.2  30.0 - 36.0 g/dL   RDW 40.9  81.1 - 91.4 %   Platelets 408 (*) 150 - 400 K/uL  BASIC METABOLIC PANEL      Result Value Range   Sodium 135  135 - 145 mEq/L   Potassium 3.5  3.5 - 5.1 mEq/L   Chloride 95 (*) 96 - 112 mEq/L   CO2 30  19 - 32 mEq/L   Glucose, Bld 168 (*) 70 - 99 mg/dL   BUN 10  6 - 23 mg/dL   Creatinine, Ser 7.82  0.50 - 1.10 mg/dL   Calcium 8.2 (*) 8.4 - 10.5 mg/dL   GFR calc non Af Amer >90  >90 mL/min   GFR calc Af Amer >90  >90 mL/min  POCT I-STAT TROPONIN I      Result Value Range   Troponin i, poc 0.00  0.00 - 0.08 ng/mL   Comment 3                Dg Chest Port 1 View  12/24/2012  *RADIOLOGY REPORT*  Clinical Data: Right breast pain, extending to the back.  PORTABLE CHEST - 1 VIEW  Comparison: Chest radiograph performed 12/12/2012  Findings: The lungs are mildly hypoexpanded; pulmonary vascularity is at the upper limits of normal.  Minimal bilateral atelectasis is seen.  No pleural effusion or pneumothorax is identified.  A right PICC is noted ending about the proximal SVC.  The  cardiomediastinal silhouette is borderline normal  in size.  No acute osseous abnormalities are seen.  IMPRESSION: Lungs mildly hypoexpanded; minimal bilateral atelectasis seen. Lungs otherwise grossly clear.   Original Report Authenticated By: Tonia Ghent, M.D.      1. Chest pain   2. Muscle strain       MDM  10:40 PM patient seen and evaluated. Patient appears well in no significant discomfort or acute distress. Slightly tachycardic. Denies shortness of breath.  Labs, CT and EKG without any concerning findings. Patient improved with pain medications. Symptoms atypical for ACS. For PE on CT. This time we'll discharge home with PCP and specialty followup.     Date: 12/24/2012  Rate: 111  Rhythm: sinus tachycardia  QRS Axis: normal  Intervals: normal  ST/T Wave abnormalities: nonspecific T wave changes  Conduction Disutrbances:none  Narrative Interpretation:   Old EKG Reviewed: No significant changes    Angus Seller, PA-C 12/25/12 0006

## 2012-12-24 NOTE — ED Notes (Signed)
Labs obtained form PICC line. CT notified pt does have power PICC

## 2012-12-24 NOTE — ED Notes (Signed)
Pt released yesterday from hospital yesterday, s/p thyroid surgery. Pt states she feels she may have done too much today, pain to R chest under breast to sub scapular area. Pt states pain began while reaching up. Pt does state pain worse with breathing. A & O. Pt sat 100%, pt placed on 2L [place] d/t CP. Family bedside

## 2012-12-25 ENCOUNTER — Telehealth (INDEPENDENT_AMBULATORY_CARE_PROVIDER_SITE_OTHER): Payer: Self-pay | Admitting: General Surgery

## 2012-12-25 MED ORDER — HEPARIN SOD (PORK) LOCK FLUSH 100 UNIT/ML IV SOLN
500.0000 [IU] | Freq: Once | INTRAVENOUS | Status: DC
  Filled 2012-12-25: qty 5

## 2012-12-25 NOTE — Telephone Encounter (Signed)
Pt called to follow-up on call made Tues, regarding PICC line.  Pt instructed to come to front desk early on Friday (12/26/12) to pick up a STAT lab slip for [facility].  After going there to get her calcium, Dr. Gerrit Friends will review the results and decide if ok to remove the PICC line.  She will be notified that afternoon whether to come in and at what time.

## 2012-12-25 NOTE — ED Provider Notes (Signed)
Medical screening examination/treatment/procedure(s) were performed by non-physician practitioner and as supervising physician I was immediately available for consultation/collaboration.  Toy Baker, MD 12/25/12 [phone]

## 2012-12-26 ENCOUNTER — Telehealth (INDEPENDENT_AMBULATORY_CARE_PROVIDER_SITE_OTHER): Payer: Self-pay

## 2012-12-26 ENCOUNTER — Encounter (INDEPENDENT_AMBULATORY_CARE_PROVIDER_SITE_OTHER): Payer: Self-pay

## 2012-12-26 ENCOUNTER — Encounter (HOSPITAL_COMMUNITY): Payer: Self-pay | Admitting: *Deleted

## 2012-12-26 ENCOUNTER — Emergency Department (HOSPITAL_COMMUNITY)
Admission: EM | Admit: 2012-12-26 | Discharge: 2012-12-26 | Disposition: A | Discharge status: Home / Self Care | Payer: BC Managed Care – PPO | Attending: Emergency Medicine | Admitting: Emergency Medicine

## 2012-12-26 DIAGNOSIS — Z8669 Personal history of other diseases of the nervous system and sense organs: Secondary | ICD-10-CM | POA: Insufficient documentation

## 2012-12-26 DIAGNOSIS — Z9889 Other specified postprocedural states: Secondary | ICD-10-CM | POA: Insufficient documentation

## 2012-12-26 DIAGNOSIS — E785 Hyperlipidemia, unspecified: Secondary | ICD-10-CM | POA: Insufficient documentation

## 2012-12-26 DIAGNOSIS — Z8543 Personal history of malignant neoplasm of ovary: Secondary | ICD-10-CM | POA: Insufficient documentation

## 2012-12-26 DIAGNOSIS — Z79899 Other long term (current) drug therapy: Secondary | ICD-10-CM | POA: Insufficient documentation

## 2012-12-26 DIAGNOSIS — K219 Gastro-esophageal reflux disease without esophagitis: Secondary | ICD-10-CM | POA: Insufficient documentation

## 2012-12-26 DIAGNOSIS — Z8639 Personal history of other endocrine, nutritional and metabolic disease: Secondary | ICD-10-CM | POA: Insufficient documentation

## 2012-12-26 DIAGNOSIS — F411 Generalized anxiety disorder: Secondary | ICD-10-CM | POA: Insufficient documentation

## 2012-12-26 DIAGNOSIS — I10 Essential (primary) hypertension: Secondary | ICD-10-CM | POA: Insufficient documentation

## 2012-12-26 DIAGNOSIS — Z862 Personal history of diseases of the blood and blood-forming organs and certain disorders involving the immune mechanism: Secondary | ICD-10-CM | POA: Insufficient documentation

## 2012-12-26 DIAGNOSIS — M129 Arthropathy, unspecified: Secondary | ICD-10-CM | POA: Insufficient documentation

## 2012-12-26 DIAGNOSIS — Z9089 Acquired absence of other organs: Secondary | ICD-10-CM | POA: Insufficient documentation

## 2012-12-26 DIAGNOSIS — J45909 Unspecified asthma, uncomplicated: Secondary | ICD-10-CM | POA: Insufficient documentation

## 2012-12-26 LAB — COMPREHENSIVE METABOLIC PANEL
ALT: 28 U/L (ref 0–35)
AST: 24 U/L (ref 0–37)
Alkaline Phosphatase: 95 U/L (ref 39–117)
Creatinine, Ser: 0.76 mg/dL (ref 0.50–1.10)
GFR calc non Af Amer: >90 mL/min (ref >90)
Glucose, Bld: 111 mg/dL — ABNORMAL HIGH (ref 70–99)
Sodium: 136 mEq/L (ref 135–145)
Total Bilirubin: 0.2 mg/dL — ABNORMAL LOW (ref 0.3–1.2)

## 2012-12-26 LAB — CBC
MCH: 29.9 pg (ref 26.0–34.0)
MCHC: 32.9 g/dL (ref 30.0–36.0)
MCV: 90.8 fL (ref 78.0–100.0)
Platelets: 399 10*3/uL (ref 150–400)
RDW: 13.8 % (ref 11.5–15.5)

## 2012-12-26 MED ORDER — HEPARIN SOD (PORK) LOCK FLUSH 100 UNIT/ML IV SOLN
INTRAVENOUS | Status: AC
  Filled 2012-12-26: qty 5

## 2012-12-26 MED ORDER — HEPARIN SOD (PORK) LOCK FLUSH 100 UNIT/ML IV SOLN
500.0000 [IU] | Freq: Once | INTRAVENOUS | Status: AC
  Administered 2012-12-26: 500 [IU] via INTRAVENOUS

## 2012-12-26 MED ORDER — SODIUM CHLORIDE 0.9 % IV SOLN
1.0000 g | INTRAVENOUS | Status: AC
  Administered 2012-12-26 (×2): 1 g via INTRAVENOUS
  Filled 2012-12-26 (×3): qty 10

## 2012-12-26 MED ORDER — SODIUM CHLORIDE 0.9 % IV SOLN
Freq: Once | INTRAVENOUS | Status: AC
  Administered 2012-12-26: 16:00:00 via INTRAVENOUS

## 2012-12-26 NOTE — ED Notes (Signed)
md at bedside  Pt alert and oriented x4. Respirations even and unlabored, bilateral symmetrical rise and fall of chest. Skin warm and dry. In no acute distress. Denies needs.   

## 2012-12-26 NOTE — Telephone Encounter (Signed)
Reviewed stat labs [calcium 7.1] verbal called by [facility] with Dr Gerrit Friends. Per Dr Gerrit Friends review meds with pt to be sure she is taking appropriate medication suppliments. Eval pt for symptoms. Repeat stat calcium 4-14. Pt states she forgot to take her calcium and Rocaltrol this morning but did take it a little late. Pt states her symptoms have improved. Pt advised of need to have labs Monday morning and will pick up lab slip early am to take to [facility]. Pt to call if symptoms return per Dr Gerrit Friends. If symptoms return pt is to call asap and if over weekend may need to go to ER for calcium infusion via pic line.

## 2012-12-26 NOTE — ED Provider Notes (Signed)
History     CSN: [id number]  Arrival date & time 12/26/12  1333   First MD Initiated Contact with Patient 12/26/12 1353      Chief Complaint  Patient presents with  . abnormal labs, low calcium     (Consider location/radiation/quality/duration/timing/severity/associated sxs/prior treatment) HPI  Patient presents to ER after phone call from Surgeon that calcium was low at 7.1- advised to go to ER for IV replacement.  Patient had thyroidectomy on 4/3 and post-op hypocalcemia has been a persistent problem despite PO replacement since discharge.  She endorses facial numbness and tingling. Has been cramping in her extremities. She says she is compliant with her calcium carbonate QID and calcitriol BID.   Past Medical History  Diagnosis Date  . Hyperlipidemia   . Asthma   . Thyroid disease   . Arthritis   . Hypertension     per Dr. Verl Dicker note 08/30/2011  . Anxiety   . GERD (gastroesophageal reflux disease)   . Headache   . Cancer     Pre-Cancer-Ovarian    Past Surgical History  Procedure Laterality Date  . Cesarean section  1995  . Cesarean section  1996  . Cardiac catheterization  2013  . Thyroidectomy N/A 12/18/2012    Procedure: TOTAL THYROIDECTOMY;  Surgeon: Velora Heckler, MD;  Location: [facility];  Service: General;  Laterality: N/A;    Family History  Problem Relation Age of Onset  . Heart disease Mother   . Stroke Mother   . Heart disease Father   . Colon cancer Father   . Breast cancer Paternal Aunt   . Cancer Paternal Grandmother   . Cancer Paternal Aunt     History  Substance Use Topics  . Smoking status: Never Smoker   . Smokeless tobacco: Never Used  . Alcohol Use: Yes     Comment: Occasional   Review of Systems  Constitutional: Negative for fever.  Eyes: Negative for visual disturbance.  Respiratory: Negative for shortness of breath.   Cardiovascular: Negative for chest pain and palpitations.  Gastrointestinal: Negative for abdominal pain.   Neurological: Negative for dizziness.    Allergies  Latex; Dilaudid; and Hydrocodone  Home Medications   Current Outpatient Rx  Name  Route  Sig  Dispense  Refill  . albuterol (PROVENTIL HFA;VENTOLIN HFA) 108 (90 BASE) MCG/ACT inhaler   Inhalation   Inhale 2 puffs into the lungs daily as needed. For shortness of breath         . calcitRIOL (ROCALTROL) 0.25 MCG capsule   Oral   Take 1 capsule (0.25 mcg total) by mouth 2 (two) times daily.   30 capsule   1   . calcium carbonate (OS-CAL - DOSED IN MG OF ELEMENTAL CALCIUM) 1250 MG tablet   Oral   Take 3 tablets (1,500 mg of elemental calcium total) by mouth 4 (four) times daily.   120 tablet   3   . omeprazole (PRILOSEC) 20 MG capsule   Oral   Take 20 mg by mouth daily. Take 2 daily-patient been taking one when she needs it only.         Marland Kitchen oxyCODONE-acetaminophen (PERCOCET/ROXICET) 5-325 MG per tablet   Oral   Take 1-2 tablets by mouth every 4 (four) hours as needed for pain.           BP 128/66  Pulse 118  Temp(Src) 97.6 F (36.4 C) (Oral)  Resp 18  SpO2 97%  LMP 07/14/2012  Physical Exam  Vitals  reviewed. Constitutional: She is oriented to person, place, and time. She appears well-developed and well-nourished. No distress.  HENT:  Head: Normocephalic and atraumatic.  Eyes: EOM are normal. Pupils are equal, round, and reactive to light.  Neck:  Steri strips in place s/p thyroidectomy- no drainage or bleeding.   Cardiovascular: Normal rate, regular rhythm and normal heart sounds.   Pulmonary/Chest: Effort normal and breath sounds normal.  Abdominal: Soft. Bowel sounds are normal. There is no tenderness.  Musculoskeletal: She exhibits no edema.  Neurological: She is alert and oriented to person, place, and time. She displays normal reflexes. No cranial nerve deficit. She exhibits normal muscle tone.    ED Course  Procedures (including critical care time)  Labs Reviewed  COMPREHENSIVE METABOLIC PANEL -  Abnormal; Notable for the following:    Potassium 3.4 (*)    Glucose, Bld 111 (*)    Calcium 7.5 (*)    Albumin 3.4 (*)    Total Bilirubin 0.2 (*)    All other components within normal limits  CBC - Abnormal; Notable for the following:    WBC 12.6 (*)    All other components within normal limits  MAGNESIUM   Ct Angio Chest W/cm &/or Wo Cm  12/24/2012  *RADIOLOGY REPORT*  Clinical Data: Chest pain and shortness of breath.  Recent thyroid surgery.  CT ANGIOGRAPHY CHEST  Technique:  Multidetector CT imaging of the chest using the standard protocol during bolus administration of intravenous contrast. Multiplanar reconstructed images including MIPs were obtained and reviewed to evaluate the vascular anatomy.  Contrast: OMNIPAQUE IOHEXOL 350 MG/ML SOLN  Comparison: Chest radiograph performed earlier today at 10:28 p.m., and CTA of the chest performed 06/18/2011  Findings: There is no evidence of pulmonary embolus.  The lungs are essentially clear bilaterally.  There is no evidence of significant focal consolidation, pleural effusion or pneumothorax.  No masses are identified; no abnormal focal contrast enhancement is seen.  The patient's right PICC is noted ending at the proximal SVC.  No mediastinal lymphadenopathy is seen.  No pericardial effusion is identified.  The great vessels are grossly unremarkable in appearance.  Postoperative soft tissue air is noted anterior to the inferior aspect of the thyroid, with mild associated soft tissue inflammation.  Note is made of vague diffuse edema about the thyroid bed, with associated postoperative change.  No definite focal abscess is seen.  The proximal trachea appears unremarkable. There is apparent luminal narrowing about the level of the vocal cords, though this is thought to be transient in nature.  No axillary lymphadenopathy is seen.  The visualized portions of the liver and spleen are unremarkable.  No acute osseous abnormalities are seen.   IMPRESSION:  1.  No evidence of pulmonary embolus. 2.  Lungs clear bilaterally. 3.  Vague diffuse edema noted about the thyroid bed, with associated postoperative change.  No focal abscess seen.  Overlying postoperative soft tissue air seen, with mild associated soft tissue inflammation.  The proximal trachea is unremarkable in appearance; apparent luminal narrowing about the level of the vocal cords is above the level of edema, and is thought to be transient in nature.   Original Report Authenticated By: Tonia Ghent, M.D.    Dg Chest Port 1 View  12/24/2012  *RADIOLOGY REPORT*  Clinical Data: Right breast pain, extending to the back.  PORTABLE CHEST - 1 VIEW  Comparison: Chest radiograph performed 12/12/2012  Findings: The lungs are mildly hypoexpanded; pulmonary vascularity is at the upper limits of  normal.  Minimal bilateral atelectasis is seen.  No pleural effusion or pneumothorax is identified.  A right PICC is noted ending about the proximal SVC.  The cardiomediastinal silhouette is borderline normal in size.  No acute osseous abnormalities are seen.  IMPRESSION: Lungs mildly hypoexpanded; minimal bilateral atelectasis seen. Lungs otherwise grossly clear.   Original Report Authenticated By: Tonia Ghent, M.D.      1. Hypocalcemia       MDM  Discussed with Dr. Gerrit Friends- patient was instructed to go to Short stay for IV calcium gluconate infusion but there was confusion.  He says if she is already checked, in can go ahead and give her 3 G IV calcium gluconate.  He says then we can discharge her home and she is to follow up in their office on Monday.   Patient will be given 3 G of IV Calcium gluconate and discharged home.        Ardyth Gal, MD 12/26/12 1546  Ardyth Gal, MD 12/26/12 [phone]

## 2012-12-26 NOTE — Telephone Encounter (Signed)
Patient to stay at [facility] ED and will receive Calcium Gluconate 3 gm, IV Infusion via PICC Line there.  Notified short stay as well.

## 2012-12-26 NOTE — ED Notes (Signed)
Pt reports Dr. Gerrit Friends called pt and told pt to come to ED for calcium infusion. At present pt reports calcium 7.1. Pt reports face, hands, and feet tingling and stomach cramps. Pt denies pain at present. Pain earlier.

## 2012-12-26 NOTE — ED Notes (Signed)
ZOX:[id number]<AV> Expected date:<BR> Expected time:<BR> Means of arrival:<BR> Comments:<BR> [place]

## 2012-12-26 NOTE — Telephone Encounter (Signed)
Rec'd call from [place] stating that the patient [facility]) facial numbness has returned.  Paged Dr. Gerrit Friends to review and given verbal order for patient to go to Short Stay and patient need's to have Calcium Gluconate 3gm, IV infusion via PICC Line started.  Called [place] back and was informed that they are at [facility] ED.  Informed [place] [facility] that I am calling [facility] Short Stay to arrange patients infusion.  Called [facility] Short Stay to arrange for patient to have 3 grams of Calcium Gluconate IV Infusion via PICC Line.  Awaiting return call from Short Stay.

## 2012-12-26 NOTE — ED Notes (Signed)
IV team paged to access PICC line. Charge RN attempted to flush and access line unsuccessfully.

## 2012-12-26 NOTE — ED Notes (Signed)
Attempted to flush picc line and neither line would flush or pull back blood, will page iv team to access.

## 2012-12-27 NOTE — ED Provider Notes (Signed)
I saw and evaluated the patient, reviewed the resident's note and I agree with the findings and plan. Recent thyroid surgery. Low Ca. Discussed with Dr Gerrit Friends. Will replace Ca   Loren Racer, MD 12/27/12 [phone]

## 2012-12-29 ENCOUNTER — Telehealth (INDEPENDENT_AMBULATORY_CARE_PROVIDER_SITE_OTHER): Payer: Self-pay

## 2012-12-29 ENCOUNTER — Telehealth (INDEPENDENT_AMBULATORY_CARE_PROVIDER_SITE_OTHER): Payer: Self-pay | Admitting: General Surgery

## 2012-12-29 NOTE — Telephone Encounter (Signed)
[facility] with [facility] called to report a STAT calcium level of 7.6.

## 2012-12-29 NOTE — Telephone Encounter (Signed)
Per Dr Ardine Eng request pt notified of lab result and reminded importance of taking meds. Pt to have stat labs am of 4-21 and keep afternoon appt same day with Dr Gerrit Friends. Lab slip at front desk for pt to pick up.

## 2013-01-05 ENCOUNTER — Ambulatory Visit (INDEPENDENT_AMBULATORY_CARE_PROVIDER_SITE_OTHER): Payer: BC Managed Care – PPO | Admitting: Surgery

## 2013-01-05 ENCOUNTER — Encounter (INDEPENDENT_AMBULATORY_CARE_PROVIDER_SITE_OTHER): Payer: Self-pay

## 2013-01-05 ENCOUNTER — Encounter (INDEPENDENT_AMBULATORY_CARE_PROVIDER_SITE_OTHER): Payer: Self-pay | Admitting: Surgery

## 2013-01-05 VITALS — BP 140/76 | HR 103 | Temp 96.4°F | Ht 63.0 in | Wt 272.4 lb

## 2013-01-05 DIAGNOSIS — E042 Nontoxic multinodular goiter: Secondary | ICD-10-CM

## 2013-01-05 NOTE — Patient Instructions (Signed)
  COCOA BUTTER & VITAMIN E CREAM  ([employer] or other brand)  Apply cocoa butter/vitamin E cream to your incision 2 - 3 times daily.  Massage cream into incision for one minute with each application.  Use sunscreen (50 SPF or higher) for first 6 months after surgery if area is exposed to sun.  You may substitute Mederma or other scar reducing creams as desired.   

## 2013-01-05 NOTE — Progress Notes (Signed)
General Surgery [facility]  Visit Diagnoses: 1. Multinodular goiter (nontoxic)     HISTORY: Patient returns for her first postoperative visit having undergone total thyroidectomy. Postoperative course has been complicated by hypocalcemia. Final pathology showed benign multinodular goiter but there was one benign parathyroid gland found in the operative specimen. Patient is taking Rocaltrol twice daily and calcium supplements 4 times daily. A calcium level is pending from this morning. Patient continues to have a PICC line in place in case her hypocalcemia becomes more significant.  EXAM: Cervical incision is healing nicely. Mild soft tissue swelling. No sign of infection. No significant seroma. Voice is still mildly hoarse.  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 On examination she still has signs of hypocalcemia. She will continue on vitamin D supplementation and calcium supplementation. We will continue to monitor her serum calcium levels. Patient will return in 3 weeks for wound check.  We will obtain the results of today's calcium level. If her calcium levels have stabilized around 7.5, we will remove her PICC line.  Velora Heckler, MD, FACS General & Endocrine Surgery [facility]

## 2013-01-05 NOTE — Addendum Note (Signed)
Addended by: Joanette Gula on: 01/05/2013 02:34 PM   Modules accepted: Orders

## 2013-01-06 ENCOUNTER — Telehealth (INDEPENDENT_AMBULATORY_CARE_PROVIDER_SITE_OTHER): Payer: Self-pay

## 2013-01-06 NOTE — Telephone Encounter (Signed)
Verbal report from [facility] re: calcium result. Calcium drawn 01-05-13 = 8.0. Pt has appt for 4-23 to have pic line removed.

## 2013-01-07 ENCOUNTER — Encounter (INDEPENDENT_AMBULATORY_CARE_PROVIDER_SITE_OTHER): Payer: Self-pay | Admitting: Surgery

## 2013-01-07 ENCOUNTER — Ambulatory Visit (INDEPENDENT_AMBULATORY_CARE_PROVIDER_SITE_OTHER): Payer: BC Managed Care – PPO | Admitting: Surgery

## 2013-01-07 NOTE — Progress Notes (Signed)
General Surgery [facility]  Visit Diagnoses: 1. Hypocalcemia     HISTORY: The patient returns for follow-up of serum calcium level and before removal of her PICC line from the right upper extremity.  Calcium level this week has risen to 8.0 on her current regimen of vitamin D supplements and calcium supplements.  EXAM: Surgical incision is healing nicely. No sign of infection. No sign of seroma. PICC line in right upper extremity is undressed and removed in its entirety without complication. Dry gauze dressing is applied.  IMPRESSION: #1 status post total thyroidectomy for multinodular goiter #2 hypocalcemia, iatrogenic #3 indwelling PICC line, removed today  PLAN: Patient will continue with calcium supplements and vitamin D supplements. She will apply topical creams to her incision. She will return for followup in 2 weeks. We will continue to monitor her calcium levels weekly.  Velora Heckler, MD, FACS General & Endocrine Surgery [facility]

## 2013-01-07 NOTE — Patient Instructions (Signed)
Clean skin with adhesive remover or nail polish remover.  May shower.  Velora Heckler, MD, [facility] Office: [phone]

## 2013-01-09 ENCOUNTER — Encounter (INDEPENDENT_AMBULATORY_CARE_PROVIDER_SITE_OTHER): Payer: Self-pay

## 2013-01-14 ENCOUNTER — Encounter (INDEPENDENT_AMBULATORY_CARE_PROVIDER_SITE_OTHER): Payer: BC Managed Care – PPO | Admitting: Surgery

## 2013-01-19 ENCOUNTER — Telehealth (INDEPENDENT_AMBULATORY_CARE_PROVIDER_SITE_OTHER): Payer: Self-pay | Admitting: *Deleted

## 2013-01-19 NOTE — Telephone Encounter (Signed)
Patient called to state she has noticed decreased hair on her legs and is having to sit down after 5 minutes of walking.  Suggested to patient she be seen by PMD to try to assess what symptoms are due too.  Patient states she does not currently have a PMD but will see an urgent office MD.  Patient agreeable with POC at this time and states understanding.

## 2013-01-21 DIAGNOSIS — M549 Dorsalgia, unspecified: Secondary | ICD-10-CM | POA: Insufficient documentation

## 2013-01-21 DIAGNOSIS — R109 Unspecified abdominal pain: Secondary | ICD-10-CM | POA: Insufficient documentation

## 2013-01-21 DIAGNOSIS — M129 Arthropathy, unspecified: Secondary | ICD-10-CM | POA: Insufficient documentation

## 2013-01-21 DIAGNOSIS — J45909 Unspecified asthma, uncomplicated: Secondary | ICD-10-CM | POA: Insufficient documentation

## 2013-01-21 DIAGNOSIS — Z91040 Latex allergy status: Secondary | ICD-10-CM | POA: Insufficient documentation

## 2013-01-21 DIAGNOSIS — Z79899 Other long term (current) drug therapy: Secondary | ICD-10-CM | POA: Insufficient documentation

## 2013-01-21 DIAGNOSIS — Z8659 Personal history of other mental and behavioral disorders: Secondary | ICD-10-CM | POA: Insufficient documentation

## 2013-01-21 DIAGNOSIS — K219 Gastro-esophageal reflux disease without esophagitis: Secondary | ICD-10-CM | POA: Insufficient documentation

## 2013-01-21 DIAGNOSIS — Z8543 Personal history of malignant neoplasm of ovary: Secondary | ICD-10-CM | POA: Insufficient documentation

## 2013-01-21 DIAGNOSIS — Z862 Personal history of diseases of the blood and blood-forming organs and certain disorders involving the immune mechanism: Secondary | ICD-10-CM | POA: Insufficient documentation

## 2013-01-21 DIAGNOSIS — Z8639 Personal history of other endocrine, nutritional and metabolic disease: Secondary | ICD-10-CM | POA: Insufficient documentation

## 2013-01-21 DIAGNOSIS — I10 Essential (primary) hypertension: Secondary | ICD-10-CM | POA: Insufficient documentation

## 2013-01-22 ENCOUNTER — Emergency Department (HOSPITAL_COMMUNITY)
Admission: EM | Admit: 2013-01-22 | Discharge: 2013-01-22 | Disposition: A | Discharge status: Home / Self Care | Payer: BC Managed Care – PPO | Attending: Emergency Medicine | Admitting: Emergency Medicine

## 2013-01-22 ENCOUNTER — Encounter (HOSPITAL_COMMUNITY): Payer: Self-pay | Admitting: Emergency Medicine

## 2013-01-22 DIAGNOSIS — R109 Unspecified abdominal pain: Secondary | ICD-10-CM

## 2013-01-22 LAB — CBC
MCH: 29.4 pg (ref 26.0–34.0)
MCHC: 31.8 g/dL (ref 30.0–36.0)
MCV: 92.4 fL (ref 78.0–100.0)
Platelets: 411 10*3/uL — ABNORMAL HIGH (ref 150–400)
RDW: 14.9 % (ref 11.5–15.5)

## 2013-01-22 LAB — URINALYSIS, ROUTINE W REFLEX MICROSCOPIC
Bilirubin Urine: NEGATIVE
Hgb urine dipstick: NEGATIVE
Ketones, ur: NEGATIVE mg/dL
Nitrite: NEGATIVE
Protein, ur: NEGATIVE mg/dL
Specific Gravity, Urine: 1.033 — ABNORMAL HIGH (ref 1.005–1.030)
Urobilinogen, UA: 0.2 mg/dL (ref 0.0–1.0)

## 2013-01-22 LAB — BASIC METABOLIC PANEL
Calcium: 8.4 mg/dL (ref 8.4–10.5)
Creatinine, Ser: 0.92 mg/dL (ref 0.50–1.10)
GFR calc non Af Amer: 74 mL/min — ABNORMAL LOW (ref >90)
Sodium: 135 mEq/L (ref 135–145)

## 2013-01-22 MED ORDER — PERCOCET 5-325 MG PO TABS: 1.0000 | ORAL_TABLET | Freq: Four times a day (QID) | ORAL | Status: DC | PRN

## 2013-01-22 MED ORDER — OXYCODONE-ACETAMINOPHEN 5-325 MG PO TABS
1.0000 | ORAL_TABLET | Freq: Once | ORAL | Status: AC
  Administered 2013-01-22: 1 via ORAL
  Filled 2013-01-22: qty 1

## 2013-01-22 MED ORDER — LORAZEPAM 1 MG PO TABS
1.0000 mg | ORAL_TABLET | Freq: Once | ORAL | Status: AC
  Administered 2013-01-22: 1 mg via ORAL
  Filled 2013-01-22: qty 1

## 2013-01-22 NOTE — ED Notes (Signed)
Pt c/o R flank pain onset 6 days ago. Worse since last night.

## 2013-01-22 NOTE — ED Provider Notes (Signed)
History     CSN: [id number]  Arrival date & time 01/21/13  2354   First MD Initiated Contact with Patient 01/22/13 0147      Chief Complaint  Patient presents with  . Flank Pain    (Consider location/radiation/quality/duration/timing/severity/associated sxs/prior treatment) HPI Comments: Athleen Feltner is a 47 y.o. female with a history of hypertension, asthma, arthritis and hypocalcemia (status post thyroidectomy) presents emergency department complaining of right flank pain.  Onset of symptoms began approximately 6 days ago and are described as back pain worse with movement.  Pain is also worse at night.  No known alleviating factors known.  Patient denies any hematuria, vaginal discharge, dysuria, dyspareunia, abdominal pain, nausea, vomiting, fevers, night sweats or chills.  Patient denies any recent trauma or injury to the area.  There is been no numbness or weakness of extremities. Patient has not experienced similar pain in past.  Patient is a 47 y.o. female presenting with flank pain. The history is provided by the patient.  Flank Pain    Past Medical History  Diagnosis Date  . Hyperlipidemia   . Asthma   . Thyroid disease   . Arthritis   . Hypertension     per Dr. Verl Dicker note 08/30/2011  . Anxiety   . GERD (gastroesophageal reflux disease)   . Headache   . Cancer     Pre-Cancer-Ovarian    Past Surgical History  Procedure Laterality Date  . Cesarean section  1995  . Cesarean section  1996  . Cardiac catheterization  2013  . Thyroidectomy N/A 12/18/2012    Procedure: TOTAL THYROIDECTOMY;  Surgeon: Velora Heckler, MD;  Location: [facility];  Service: General;  Laterality: N/A;    Family History  Problem Relation Age of Onset  . Heart disease Mother   . Stroke Mother   . Heart disease Father   . Colon cancer Father   . Breast cancer Paternal Aunt   . Cancer Paternal Grandmother   . Cancer Paternal Aunt     History  Substance Use Topics  . Smoking status: Never  Smoker   . Smokeless tobacco: Never Used  . Alcohol Use: Yes     Comment: Occasional    OB History   Grav Para Term Preterm Abortions TAB SAB Ect Mult Living                  Review of Systems  Genitourinary: Positive for flank pain.  All other systems reviewed and are negative.    Allergies  Latex; Dilaudid; and Hydrocodone  Home Medications   Current Outpatient Rx  Name  Route  Sig  Dispense  Refill  . acetaminophen (TYLENOL) 500 MG tablet   Oral   Take 1,000 mg by mouth every 6 (six) hours as needed for pain.         Marland Kitchen albuterol (PROVENTIL HFA;VENTOLIN HFA) 108 (90 BASE) MCG/ACT inhaler   Inhalation   Inhale 2 puffs into the lungs daily as needed. For shortness of breath         . calcitRIOL (ROCALTROL) 0.25 MCG capsule   Oral   Take 1 capsule (0.25 mcg total) by mouth 2 (two) times daily.   30 capsule   1   . calcium carbonate (OS-CAL - DOSED IN MG OF ELEMENTAL CALCIUM) 1250 MG tablet   Oral   Take 3 tablets (1,500 mg of elemental calcium total) by mouth 4 (four) times daily.   120 tablet   3   .  omeprazole (PRILOSEC) 20 MG capsule   Oral   Take 20 mg by mouth daily. Take 2 daily-patient been taking one when she needs it only.         Marland Kitchen oxyCODONE-acetaminophen (PERCOCET/ROXICET) 5-325 MG per tablet   Oral   Take 1-2 tablets by mouth every 4 (four) hours as needed for pain.           BP 135/87  Pulse 93  Temp(Src) 98.3 F (36.8 C) (Oral)  Resp 18  Ht 5\' 3"  (1.6 m)  Wt 271 lb (122.925 kg)  BMI 48.02 kg/m2  SpO2 96%  LMP 07/14/2012  Physical Exam  Nursing note and vitals reviewed. Constitutional: She is oriented to person, place, and time. She appears well-developed and well-nourished. No distress.  HENT:  Head: Normocephalic and atraumatic.  Eyes: Conjunctivae and EOM are normal. Pupils are equal, round, and reactive to light. No scleral icterus.  Neck: Normal range of motion and full passive range of motion without pain. Neck supple.  No spinous process tenderness and no muscular tenderness present. No rigidity. Normal range of motion present. No Brudzinski's sign noted.  Cardiovascular: Normal rate, regular rhythm and intact distal pulses.  Exam reveals no gallop and no friction rub.   No murmur heard. Intact distal pulses, capillary refill less than 3 seconds bilaterally.   Pulmonary/Chest: Effort normal and breath sounds normal. No respiratory distress. She has no wheezes. She has no rales. She exhibits no tenderness.  Musculoskeletal:  Normal ROM of lower extremities. Left lumbar paraspinal tenderness. No bony process tenderness.   Neurological: She is alert and oriented to person, place, and time. She has normal strength and normal reflexes. No sensory deficit. Abnormal gait: no ataxia, slowed and hunched d/t pain   Sensation at baseline for light touch in all 4 distal extremities, motor symmetric & bilateral 5/5   Skin: Skin is warm and dry. No rash noted. She is not diaphoretic. No erythema. No pallor.  Psychiatric: She has a normal mood and affect.    ED Course  Procedures (including critical care time)  Labs Reviewed  URINALYSIS, ROUTINE W REFLEX MICROSCOPIC - Abnormal; Notable for the following:    APPearance CLOUDY (*)    Specific Gravity, Urine 1.033 (*)    All other components within normal limits  CBC - Abnormal; Notable for the following:    Platelets 411 (*)    All other components within normal limits  BASIC METABOLIC PANEL - Abnormal; Notable for the following:    Potassium 3.3 (*)    Glucose, Bld 120 (*)    GFR calc non Af Amer 74 (*)    GFR calc Af Amer 85 (*)    All other components within normal limits   No results found.   No diagnosis found.    MDM  Lumbar/flank pain  No neurological deficits and normal neuro exam.  Patient can walk in ER without difficulty. No loss of bowel or bladder control.  No concern for cauda equina.  No fever, night sweats, weight loss, h/o cancer, IVDU.  UA  reviewed without hematuria, crystals or infection. RICE protocol and pain medicine indicated and discussed with patient.          Jaci Carrel, [place] 01/22/13 [phone]

## 2013-01-22 NOTE — ED Notes (Signed)
PA at bedside.

## 2013-01-22 NOTE — ED Provider Notes (Signed)
Medical screening examination/treatment/procedure(s) were performed by non-physician practitioner and as supervising physician I was immediately available for consultation/collaboration.  John-Adam Lyndel Sarate, M.D.   John-Adam Jahmere Bramel, MD 01/22/13 0636 

## 2013-01-26 ENCOUNTER — Encounter (HOSPITAL_COMMUNITY): Payer: Self-pay | Admitting: Emergency Medicine

## 2013-01-26 ENCOUNTER — Emergency Department (HOSPITAL_COMMUNITY)
Admission: EM | Admit: 2013-01-26 | Discharge: 2013-01-26 | Disposition: A | Discharge status: Home / Self Care | Payer: BC Managed Care – PPO | Attending: Emergency Medicine | Admitting: Emergency Medicine

## 2013-01-26 DIAGNOSIS — J45909 Unspecified asthma, uncomplicated: Secondary | ICD-10-CM | POA: Insufficient documentation

## 2013-01-26 DIAGNOSIS — Z8639 Personal history of other endocrine, nutritional and metabolic disease: Secondary | ICD-10-CM | POA: Insufficient documentation

## 2013-01-26 DIAGNOSIS — M545 Low back pain, unspecified: Secondary | ICD-10-CM | POA: Insufficient documentation

## 2013-01-26 DIAGNOSIS — Z91040 Latex allergy status: Secondary | ICD-10-CM | POA: Insufficient documentation

## 2013-01-26 DIAGNOSIS — Z8543 Personal history of malignant neoplasm of ovary: Secondary | ICD-10-CM | POA: Insufficient documentation

## 2013-01-26 DIAGNOSIS — I10 Essential (primary) hypertension: Secondary | ICD-10-CM | POA: Insufficient documentation

## 2013-01-26 DIAGNOSIS — Z8739 Personal history of other diseases of the musculoskeletal system and connective tissue: Secondary | ICD-10-CM | POA: Insufficient documentation

## 2013-01-26 DIAGNOSIS — M549 Dorsalgia, unspecified: Secondary | ICD-10-CM

## 2013-01-26 DIAGNOSIS — Z862 Personal history of diseases of the blood and blood-forming organs and certain disorders involving the immune mechanism: Secondary | ICD-10-CM | POA: Insufficient documentation

## 2013-01-26 DIAGNOSIS — Z79899 Other long term (current) drug therapy: Secondary | ICD-10-CM | POA: Insufficient documentation

## 2013-01-26 DIAGNOSIS — Z8659 Personal history of other mental and behavioral disorders: Secondary | ICD-10-CM | POA: Insufficient documentation

## 2013-01-26 DIAGNOSIS — K219 Gastro-esophageal reflux disease without esophagitis: Secondary | ICD-10-CM | POA: Insufficient documentation

## 2013-01-26 MED ORDER — KETOROLAC TROMETHAMINE 60 MG/2ML IM SOLN
60.0000 mg | Freq: Once | INTRAMUSCULAR | Status: AC
  Administered 2013-01-26: 60 mg via INTRAMUSCULAR
  Filled 2013-01-26: qty 2

## 2013-01-26 MED ORDER — OXYCODONE-ACETAMINOPHEN 5-325 MG PO TABS: 1.0000 | ORAL_TABLET | ORAL | Status: DC | PRN

## 2013-01-26 MED ORDER — DIAZEPAM 5 MG PO TABS: 5.0000 mg | ORAL_TABLET | Freq: Four times a day (QID) | ORAL | Status: DC | PRN

## 2013-01-26 NOTE — ED Provider Notes (Signed)
History    CSN: [id number] Arrival date & time 01/26/13  1844 First MD Initiated Contact with Patient 01/26/13 1934      Chief Complaint  Patient presents with  . Back Pain    HPI The patient presents to the emergency department with persistent low back pain that is greater on the right side.  Patient feels like there is a knot in her back. The pain is sharp and increases with movement and walking. She has not had any trouble with urination or abdominal pain. She denies any chest pain or shortness of breath. Patient was seen previously in the emergency department and was prescribed pain medications. She had some transient relief but the symptoms persist. Patient feels like she has a muscle but she cannot relax.  She denies numbness or weakness. No difficulty urinating. She came back to the emergency room because she does not have a primary care doctor and  the symptoms have persisted. Past Medical History  Diagnosis Date  . Hyperlipidemia   . Asthma   . Thyroid disease   . Arthritis   . Hypertension     per Dr. Verl Dicker note 08/30/2011  . Anxiety   . GERD (gastroesophageal reflux disease)   . Headache   . Cancer     Pre-Cancer-Ovarian    Past Surgical History  Procedure Laterality Date  . Cesarean section  1995  . Cesarean section  1996  . Cardiac catheterization  2013  . Thyroidectomy N/A 12/18/2012    Procedure: TOTAL THYROIDECTOMY;  Surgeon: Velora Heckler, MD;  Location: [facility];  Service: General;  Laterality: N/A;    Family History  Problem Relation Age of Onset  . Heart disease Mother   . Stroke Mother   . Heart disease Father   . Colon cancer Father   . Breast cancer Paternal Aunt   . Cancer Paternal Grandmother   . Cancer Paternal Aunt     History  Substance Use Topics  . Smoking status: Never Smoker   . Smokeless tobacco: Never Used  . Alcohol Use: Yes     Comment: Occasional    OB History   Grav Para Term Preterm Abortions TAB SAB Ect Mult Living                   Review of Systems  All other systems reviewed and are negative.    Allergies  Latex; Dilaudid; and Hydrocodone  Home Medications   Current Outpatient Rx  Name  Route  Sig  Dispense  Refill  . acetaminophen (TYLENOL) 500 MG tablet   Oral   Take 1,000 mg by mouth every 6 (six) hours as needed for pain.         Marland Kitchen albuterol (PROVENTIL HFA;VENTOLIN HFA) 108 (90 BASE) MCG/ACT inhaler   Inhalation   Inhale 2 puffs into the lungs daily as needed. For shortness of breath         . calcitRIOL (ROCALTROL) 0.25 MCG capsule   Oral   Take 1 capsule (0.25 mcg total) by mouth 2 (two) times daily.   30 capsule   1   . calcium carbonate (OS-CAL - DOSED IN MG OF ELEMENTAL CALCIUM) 1250 MG tablet   Oral   Take 3 tablets (1,500 mg of elemental calcium total) by mouth 4 (four) times daily.   120 tablet   3   . omeprazole (PRILOSEC) 20 MG capsule   Oral   Take 20 mg by mouth daily. Take 2 daily-patient been  taking one when she needs it only.         Marland Kitchen oxyCODONE-acetaminophen (PERCOCET/ROXICET) 5-325 MG per tablet   Oral   Take 1-2 tablets by mouth every 4 (four) hours as needed for pain.           BP 130/96  Pulse 115  Temp(Src) 98.1 F (36.7 C) (Oral)  Resp 20  Wt 271 lb (122.925 kg)  BMI 48.02 kg/m2  SpO2 100%  LMP 07/14/2012  Physical Exam  Nursing note and vitals reviewed. Constitutional: She appears well-developed and well-nourished. No distress.  HENT:  Head: Normocephalic and atraumatic.  Right Ear: External ear normal.  Left Ear: External ear normal.  Nose: Nose normal.  Eyes: Conjunctivae and EOM are normal. Right eye exhibits no discharge. Left eye exhibits no discharge. No scleral icterus.  Neck: Neck supple. No tracheal deviation present.  Cardiovascular: Normal rate, regular rhythm and intact distal pulses.   Pulmonary/Chest: Effort normal and breath sounds normal. No stridor. No respiratory distress. She has no wheezes. She has no rales.   Abdominal: Soft. Bowel sounds are normal. She exhibits no distension. There is no tenderness. There is no rebound and no guarding.  Musculoskeletal: She exhibits no edema and no tenderness.       Lumbar back: She exhibits decreased range of motion, tenderness, pain and spasm. She exhibits no swelling and no edema.  Neurological: She is alert. She has normal strength. She is not disoriented. No sensory deficit. Cranial nerve deficit:  no gross defecits noted. She exhibits normal muscle tone. She displays no seizure activity. Coordination normal.  Reflex Scores:      Patellar reflexes are 2+ on the right side and 2+ on the left side.      Achilles reflexes are 2+ on the right side and 2+ on the left side. Skin: Skin is warm and dry. No rash noted. She is not diaphoretic. No erythema.  Psychiatric: She has a normal mood and affect. Her behavior is normal. Thought content normal.    ED Course  Procedures (including critical care time)  Labs Reviewed - No data to display No results found.     MDM  Patient's symptoms are suggestive of musculoskeletal back pain.  She has no abdominal pain. She has no acute neurologic symptoms. The patient was seen in the emergency department a few days ago and did have laboratory tests including her calcium level checked.  I will add on muscle relaxants. I recommend patient follow up with primary care Dr. or Hospital a chiropractor for further treatment.  Patient is a mild tachycardia here but I believe is most likely related to her pain and discomfort. She does not have signs of infection. She does not have any cardiopulmonary symptoms.        Celene Kras, MD 01/26/13 2020

## 2013-01-26 NOTE — ED Notes (Signed)
Patient is alert and oriented x3.  She is complaining right lower back pain that she rates 10 of 10.  She recently had thyroid surgery and continues to have muscle contraction issues.  She is currently on pain medications and a muscle relaxer with no relief

## 2013-01-26 NOTE — ED Notes (Signed)
Patient states that she was here 2 -3 days ago for similar pain. The patient reports that she continues to have the pain

## 2013-01-26 NOTE — ED Notes (Signed)
Patient is alert and oriented x3.  She was given DC instructions and follow up visit instructions.  Patient gave verbal understanding. She was DC ambulatory under her own power to home.  V/S stable.  He was not showing any signs of distress on DC 

## 2013-01-28 ENCOUNTER — Ambulatory Visit (INDEPENDENT_AMBULATORY_CARE_PROVIDER_SITE_OTHER): Payer: BC Managed Care – PPO | Admitting: Surgery

## 2013-01-28 ENCOUNTER — Encounter (INDEPENDENT_AMBULATORY_CARE_PROVIDER_SITE_OTHER): Payer: Self-pay | Admitting: Surgery

## 2013-01-28 VITALS — BP 144/98 | HR 88 | Temp 98.6°F | Resp 20 | Ht 63.0 in | Wt 282.0 lb

## 2013-01-28 DIAGNOSIS — E042 Nontoxic multinodular goiter: Secondary | ICD-10-CM

## 2013-01-28 MED ORDER — LEVOTHYROXINE SODIUM 112 MCG PO TABS: 112.0000 ug | ORAL_TABLET | Freq: Every day | ORAL | Status: DC

## 2013-01-28 NOTE — Progress Notes (Signed)
General Surgery [facility]  Visit Diagnoses: 1. Multinodular goiter (nontoxic)   2. Hypocalcemia     HISTORY: Patient returns for followup after total thyroidectomy. Postoperative course complicated by hypocalcemia. Patient is taking calcium supplements 4 times daily and vitamin D supplements twice daily. Her most recent calcium level is now 8.1. She continues to have some muscle spasm. She has not started on thyroid hormone replacement.  EXAM: Surgical wound is healing nicely. Minimal soft tissue swelling. Voice quality remains hoarse.  IMPRESSION: #1 status post total thyroidectomy for multinodular goiter #2 iatrogenic hypoparathyroidism, transient #3 iatrogenic hypothyroidism  PLAN: The patient will start on Synthroid 112 mcg daily. I have asked her to continue taking calcium and vitamin D supplements as currently directed. We will see her back in 2 weeks for clinical followup and review of calcium and intact PTH levels. Patient has a prescription for Valium to use as needed for muscle cramping and spasm in the back.  Velora Heckler, MD, FACS General & Endocrine Surgery [facility]

## 2013-01-28 NOTE — Patient Instructions (Signed)
Continue Calcium and Vit D supplements as you are taking it now.  Begin taking thyroid hormone.  Use Valium for muscle cramping.  Velora Heckler, MD, [facility] Office: [phone]

## 2013-01-29 ENCOUNTER — Encounter (INDEPENDENT_AMBULATORY_CARE_PROVIDER_SITE_OTHER): Payer: Self-pay

## 2013-02-01 ENCOUNTER — Ambulatory Visit: Payer: BC Managed Care – PPO

## 2013-02-01 ENCOUNTER — Ambulatory Visit (INDEPENDENT_AMBULATORY_CARE_PROVIDER_SITE_OTHER): Payer: BC Managed Care – PPO | Admitting: Family Medicine

## 2013-02-01 VITALS — BP 164/122 | HR 115 | Temp 98.2°F | Resp 18 | Ht 64.0 in | Wt 280.4 lb

## 2013-02-01 DIAGNOSIS — IMO0002 Reserved for concepts with insufficient information to code with codable children: Secondary | ICD-10-CM

## 2013-02-01 DIAGNOSIS — M549 Dorsalgia, unspecified: Secondary | ICD-10-CM

## 2013-02-01 DIAGNOSIS — M5410 Radiculopathy, site unspecified: Secondary | ICD-10-CM

## 2013-02-01 DIAGNOSIS — R202 Paresthesia of skin: Secondary | ICD-10-CM

## 2013-02-01 DIAGNOSIS — R209 Unspecified disturbances of skin sensation: Secondary | ICD-10-CM

## 2013-02-01 MED ORDER — PREDNISONE 20 MG PO TABS: ORAL_TABLET | ORAL | Status: DC

## 2013-02-01 NOTE — Progress Notes (Signed)
  Subjective:    Patient ID: Emily Mathis, female    DOB: 30-Aug-1966, 47 y.o.   MRN: [id number]  HPI This 47 y.o. female presents for evaluation of back pain x 4 weeks.  RIGHT-sided low back feels like it's "cramping up, like muscle spasms."  Presented to the ED on 01/26/2013 and given medication (Valium and Percocet) to try to release the muscle.  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  Very frustrated, doesn't know what's going on.  No xrays yet.   No loss of bowel or bladder control.  No saddle anesthesia.  Pain radiates to the RIGHT foot.  No weakness, falls, sensation of "giving way."  Her surgeon is treating the hypocalcemia, s/p thyroidectomy, and just started levothyroxine 112 mcg 01/28/2013, and per the patient planned to refer to a back doctor if her pain persisted at her follow-up there.   She states she can't wait for that, her pain is too much.  "My hands stay tingling, and won't stop." Takes calcium due to hypocalcemia, and the tingling is better than before and tingling in the feet has resolved. Pain with grasping with the LEFT hand.  Shakes her hands to try to make the tingling stop. RIGHT hand dominant.  Past medical history, surgical history, family history, social history and problem list reviewed.  Review of Systems As above.  No CP, SOB, HA, Dizziness, N/V/Diarrhea.    Objective:   Physical Exam  Blood pressure 164/122, pulse 115, temperature 98.2 F (36.8 C), temperature source Oral, resp. rate 18, height 5\' 4"  (1.626 m), weight 280 lb 6.4 oz (127.189 kg), last menstrual period 07/14/2012, SpO2 98.00%. Body mass index is 48.11 kg/(m^2).  BP has been mildly elevated at recent visits at other offices and in the ED.  Well-developed, well nourished BF who is awake, alert and oriented, in NAD. Her 70 year old son and 75 year old daughter are present with her.  HEENT:  Emily Mathis/AT,sclera and conjunctiva are clear.  EAC are patent, TMs are normal in appearance. Nasal mucosa is pink and moist. OP is clear. Neck: supple, non-tender, no lymphadenopathy, thyromegaly. Heart: RRR, no murmur Lungs: normal effort, CTA Back: no bony tenderness.  RIGHT sided paraspinous muscle tenderness.   Neurologic:  DTRs symmetrically strong, normal sensation, good strength, negative SLR in sitting position. Extremities: no cyanosis, clubbing or edema. Skin: warm and dry without rash. Psychologic: good mood and appropriate affect, normal speech and behavior.  C-Spine: [facility] reading (PRIMARY) by  Dr. Neva Seat. Surgical clips consistent with thyroidectomy.  Question lower cervical foraminal stenosis, degenerative changes at C6-7.  LS-Spine: [facility] reading (PRIMARY) by  Dr. Neva Seat. Loss of lordosis, mild disc space narrowing at L1-2.         Assessment & Plan:  Back pain/Radiculopathy of leg - Plan: DG Lumbar Spine Complete  Paresthesias - Plan: DG Cervical Spine Complete; likely residual due to hypocalcemia.  Prednisone taper.  Advised to monitor BP, but suspect elevation today is due to pain.  OOW x 1 week, then re-evaluate.  Rest.  Reviewed good body mechanics for lifting. RTC for RTW clearance sooner if pain resolves. Continue other meds and follow-up with specialists as per their instructions.  Fernande Bras, PA-C Physician Assistant-Certified [facility]

## 2013-02-01 NOTE — Patient Instructions (Addendum)
Check your blood pressure daily.  I suspect it's higher today than usually due to your pain, but prednisone can cause it to go higher. Continue the Valium and Percocet as needed for pain.

## 2013-02-02 ENCOUNTER — Telehealth (INDEPENDENT_AMBULATORY_CARE_PROVIDER_SITE_OTHER): Payer: Self-pay

## 2013-02-02 ENCOUNTER — Telehealth: Payer: Self-pay

## 2013-02-02 NOTE — Telephone Encounter (Signed)
Pt advised per Dr Ardine Eng request that she needs to f/u with her PCP for refills of valium. Pt states she understands.

## 2013-02-02 NOTE — Telephone Encounter (Signed)
Patient advised by [facility] to call PCP for the Valium, please advise. We have not given this to her before, not appropriate to refill, correct?

## 2013-02-02 NOTE — Progress Notes (Signed)
Xray read and patient discussed with [name] Jeffery, PA-C. . Agree with assessment and plan of care per her note.   

## 2013-02-02 NOTE — Telephone Encounter (Signed)
Patient states she is to call Dr. Sid Falcon to inform him about her consult with Dr. Herma Carson . She states she is having spasm due to narrowing of her disc. She is asking for Dr. Sid Falcon to refill her Valium 5mg  @ [phone] please advise

## 2013-02-02 NOTE — Telephone Encounter (Signed)
PT STATES SHE WAS GIVEN SOME VALIUM AT THE HOSPITAL AND THEY WANT HER TO CONTINUE TAKING IT AND SHE IS OUT NEED TO HAVE [place] CALL SOME IN FOR HER. PLEASE CALL PT AT [phone]   [facility] [address]

## 2013-02-03 ENCOUNTER — Encounter (INDEPENDENT_AMBULATORY_CARE_PROVIDER_SITE_OTHER): Payer: Self-pay

## 2013-02-03 MED ORDER — DIAZEPAM 5 MG PO TABS: 5.0000 mg | ORAL_TABLET | Freq: Three times a day (TID) | ORAL | Status: DC | PRN

## 2013-02-03 NOTE — Telephone Encounter (Signed)
Thanks. I have called her to advise. She is coming back in on Monday to see [name], states she was told by [name] to take this medication until she follows up. She is using it for lumbar spasms. Please advise.

## 2013-02-03 NOTE — Telephone Encounter (Signed)
We have only seen the pt one time for acute problem, needs to get valium from PCP.

## 2013-02-03 NOTE — Telephone Encounter (Signed)
Rx printed, for 1 week supply.

## 2013-02-04 NOTE — Telephone Encounter (Signed)
Faxed; pt advised

## 2013-02-10 ENCOUNTER — Ambulatory Visit (INDEPENDENT_AMBULATORY_CARE_PROVIDER_SITE_OTHER): Payer: BC Managed Care – PPO | Admitting: Physician Assistant

## 2013-02-10 VITALS — BP 158/102 | HR 106 | Temp 97.9°F | Resp 22 | Ht 64.0 in | Wt 275.6 lb

## 2013-02-10 DIAGNOSIS — IMO0002 Reserved for concepts with insufficient information to code with codable children: Secondary | ICD-10-CM

## 2013-02-10 DIAGNOSIS — R209 Unspecified disturbances of skin sensation: Secondary | ICD-10-CM

## 2013-02-10 DIAGNOSIS — I10 Essential (primary) hypertension: Secondary | ICD-10-CM

## 2013-02-10 DIAGNOSIS — M549 Dorsalgia, unspecified: Secondary | ICD-10-CM

## 2013-02-10 DIAGNOSIS — M5410 Radiculopathy, site unspecified: Secondary | ICD-10-CM

## 2013-02-10 DIAGNOSIS — R202 Paresthesia of skin: Secondary | ICD-10-CM

## 2013-02-10 MED ORDER — MELOXICAM 15 MG PO TABS: 15.0000 mg | ORAL_TABLET | Freq: Every day | ORAL | Status: DC

## 2013-02-10 MED ORDER — DIAZEPAM 5 MG PO TABS: 5.0000 mg | ORAL_TABLET | Freq: Three times a day (TID) | ORAL | Status: DC | PRN

## 2013-02-10 MED ORDER — CHLORTHALIDONE 25 MG PO TABS: 25.0000 mg | ORAL_TABLET | Freq: Every day | ORAL | Status: DC

## 2013-02-10 NOTE — Progress Notes (Signed)
  Subjective:    Patient ID: Emily Mathis, female    DOB: 07/14/1966, 47 y.o.   MRN: [id number]  HPI  This 47 y.o. female presents for evaluation of back pain.  Recall that this pain began several weeks ago and may, at least in part, be due to recent severe hypocalcemia s/p thyroidectomy and newly initiated levothyroxine. The pain feels like a muscle spasm and is located in the RIGHT low back.  LS spine and C spine films performed a week ago revealed nothing acute, osteophytic changes at C5-6, and mild facet hypertrophic changes in the neck.  Some improvement with rest.  Any standing or walking causes worsening symptoms to the point it is unbearable and she has to sit down. She still has tingling in the hands and feet. Has been alternating ice and heat. No falls or giving way.  Sees Dr. Gerrit Friends 6/18, with calcium level to be drawn 5/28.  Unclear when she's to see Dr. Evlyn Kanner.    Has started some vaginal bleeding, has been having intermittent spotting since 02/08/2013.  She's been checking her BP as requested at 2 local pharmacies. BP at rest, and without pain, 150's-170's/90's-120's, pulse 110's.  Review of Systems As above.    Objective:   Physical Exam Blood pressure 158/102, pulse 106, temperature 97.9 F (36.6 C), temperature source Oral, resp. rate 22, height 5\' 4"  (1.626 m), weight 275 lb 9.6 oz (125.011 kg), last menstrual period 02/08/2013, SpO2 97.00%. Body mass index is 47.28 kg/(m^2). Well-developed, well nourished obese BF who is awake, alert and oriented, in NAD. HEENT: [place]/AT, sclera and conjunctiva are clear.   Neck: supple, non-tender, no lymphadenopathy, thyromegaly. Heart: RRR, no murmur Lungs: normal effort, CTA Back: Non-tender on exam. No palpable spasm. Good ROM. Extremities: no cyanosis, clubbing or edema. Skin: warm and dry without rash. Psychologic: good mood and appropriate affect, normal speech and behavior.      Assessment & Plan:  Back pain/Radiculopathy of  leg/Paresthesias - Plan: diazepam (VALIUM) 5 MG tablet, meloxicam (MOBIC) 15 MG tablet  HTN (hypertension) - Plan: chlorthalidone (HYGROTON) 25 MG tablet  Follow-up with Dr. Gerrit Friends and Dr. Evlyn Kanner per their instructions.  Once calcium level has normalized, and TSH updated, consider referral to PT.  If her progress stalls, or symptoms worsen, refer sooner.  Fernande Bras, PA-C Physician Assistant-Certified [facility]

## 2013-02-10 NOTE — Patient Instructions (Signed)
Continue to rest, and use warm compresses to the low back when you feel the pain/muscle spasm.

## 2013-02-15 ENCOUNTER — Ambulatory Visit (INDEPENDENT_AMBULATORY_CARE_PROVIDER_SITE_OTHER): Payer: BC Managed Care – PPO | Admitting: Family Medicine

## 2013-02-15 VITALS — BP 129/89 | HR 105 | Temp 98.1°F | Resp 16 | Ht 63.5 in | Wt 278.2 lb

## 2013-02-15 DIAGNOSIS — R1011 Right upper quadrant pain: Secondary | ICD-10-CM

## 2013-02-15 DIAGNOSIS — R209 Unspecified disturbances of skin sensation: Secondary | ICD-10-CM

## 2013-02-15 DIAGNOSIS — M549 Dorsalgia, unspecified: Secondary | ICD-10-CM

## 2013-02-15 DIAGNOSIS — K219 Gastro-esophageal reflux disease without esophagitis: Secondary | ICD-10-CM

## 2013-02-15 DIAGNOSIS — I10 Essential (primary) hypertension: Secondary | ICD-10-CM

## 2013-02-15 DIAGNOSIS — R202 Paresthesia of skin: Secondary | ICD-10-CM

## 2013-02-15 NOTE — Patient Instructions (Addendum)
Continue your current medications. If the gallbladder US is normal, we'll refer you to a back specialist.

## 2013-02-15 NOTE — Progress Notes (Signed)
  Subjective:    Patient ID: Emily Mathis, female    DOB: 05-07-1966, 47 y.o.   MRN: [id number]  HPI This 47 y.o. female presents for evaluation of continued RIGHT sided back and flank pain.  She continues on valium, percocet and meloxicam.  Completed prednisone.  She's tolerating the addition of chlorthalidone for HTN.  She's doing better at relaxing and delegating housework, avoiding bending and stooping, etc, but not completely.  The back pain is essentially unchanged.  It still comes and goes, and is triggered by standing and walking, bending/stooping.  It's a grabbing, cramp-like pain.  She continues to have tingling in the hands and legs.  No urinary symptoms, loss of bowel or bladder control.  Some heartburn ("I have reflux"), but no worse than usual.  She's to have bloodwork drawn next week and sees Dr. Gerrit Friends in follow-up 6/18.  Review of Systems As above.    Objective:   Physical Exam Blood pressure 129/89, pulse 105, temperature 98.1 F (36.7 C), temperature source Oral, resp. rate 16, height 5' 3.5" (1.613 m), weight 278 lb 3.2 oz (126.191 kg), last menstrual period 02/08/2013, SpO2 95.00%. Body mass index is 48.5 kg/(m^2). Well-developed, well nourished obese BF who is awake, alert and oriented, in NAD. HEENT: [place]/AT, sclera and conjunctiva are clear.   Neck: supple, non-tender, no lymphadenopathy, thyromegaly. Heart: RRR, no murmur Lungs: normal effort, CTA Abdomen: normo-active bowel sounds.  Mild tenderness in the RUQ.  No rebound or referred tenderness. No palpable masses/organomegaly. Back: tenderness over the lumbar spine and RIGHT paraspinous muscles.  Extremities: no cyanosis, clubbing or edema. Skin: warm and dry without rash. Neurologic: difficult to illicit patellar reflexes, but ankle reflexes are symmetrically strong.  Sensation is normal.  SLRs negative bilaterally. Psychologic: good mood and appropriate affect, normal speech and behavior.      Assessment &  Plan:  Back pain - appears musculoskeletal, but not significantly simproved with prednisone, NSAIDS, valium and percocet.  Paresthesias - consider low back disc etiology, or thyroid or hypocalcemia.  Await labs.  HTN (hypertension) - controlled.  Continue Chlorthalidone.  RTC 3 months.  RUQ abdominal pain - Plan: US Abdomen Limited RUQ  GERD (gastroesophageal reflux disease)  IF GB US is negative, will refer to ortho.  Proceed with labs re: hypocalcemia and hypothyroidism s/p thyroidectomy for multinodular goiter.  Discussed with Dr. Neva Seat.  Fernande Bras, PA-C Physician Assistant-Certified [facility]

## 2013-02-17 NOTE — Progress Notes (Signed)
Patient discussed with Ms. Jeffery. Agree with assessment and plan of care per her note.   

## 2013-02-18 ENCOUNTER — Encounter (INDEPENDENT_AMBULATORY_CARE_PROVIDER_SITE_OTHER): Payer: BC Managed Care – PPO | Admitting: Surgery

## 2013-02-20 ENCOUNTER — Other Ambulatory Visit: Payer: BC Managed Care – PPO

## 2013-02-23 ENCOUNTER — Ambulatory Visit
Admission: RE | Admit: 2013-02-23 | Discharge: 2013-02-23 | Disposition: A | Discharge status: Home / Self Care | Payer: BC Managed Care – PPO | Source: Ambulatory Visit | Attending: Physician Assistant | Admitting: Physician Assistant

## 2013-02-23 DIAGNOSIS — R1011 Right upper quadrant pain: Secondary | ICD-10-CM

## 2013-02-25 ENCOUNTER — Encounter (INDEPENDENT_AMBULATORY_CARE_PROVIDER_SITE_OTHER): Payer: Self-pay

## 2013-03-04 ENCOUNTER — Ambulatory Visit (INDEPENDENT_AMBULATORY_CARE_PROVIDER_SITE_OTHER): Payer: BC Managed Care – PPO | Admitting: Surgery

## 2013-03-04 ENCOUNTER — Encounter (INDEPENDENT_AMBULATORY_CARE_PROVIDER_SITE_OTHER): Payer: Self-pay | Admitting: Surgery

## 2013-03-04 ENCOUNTER — Encounter (INDEPENDENT_AMBULATORY_CARE_PROVIDER_SITE_OTHER): Payer: Self-pay

## 2013-03-04 VITALS — BP 132/84 | HR 82 | Resp 16 | Ht 63.0 in | Wt 279.4 lb

## 2013-03-04 DIAGNOSIS — E042 Nontoxic multinodular goiter: Secondary | ICD-10-CM

## 2013-03-04 NOTE — Progress Notes (Signed)
General Surgery [facility]  Visit Diagnoses: 1. Multinodular goiter (nontoxic)   2. Hypocalcemia     HISTORY: The patient is a 47 year old female who underwent total thyroidectomy. Her postoperative course was complicated by hypocalcemia. After several weeks of treatment this is now resolved. Her most recent serum calcium level is normal at 9.1. Her intact PTH level is normal at 29.  Patient is being evaluated for lumbar spine problems resulting in back spasms. She has been taking Valium for symptomatic relief.  Patient is taking Synthroid 112 mcg daily since the time of her surgery.  EXAM: Surgical incision is well-healed. There is mild keloid formation. Palpation in the thyroid bed shows no mass. There is mild tenderness. Voice remains slightly hoarse but has improved significantly.  IMPRESSION: Status post total thyroidectomy  Resolved postop hypocalcemia  PLAN: Patient will return in 3 months. I would like to evaluate her voice quality at this point. We will also check a TSH level in the interim. She may need her Synthroid dosage adjusted.  Velora Heckler, MD, FACS General & Endocrine Surgery [facility]

## 2013-03-04 NOTE — Patient Instructions (Signed)
  COCOA BUTTER & VITAMIN E CREAM  ([employer] or other brand)  Apply cocoa butter/vitamin E cream to your incision 2 - 3 times daily.  Massage cream into incision for one minute with each application.  Use sunscreen (50 SPF or higher) for first 6 months after surgery if area is exposed to sun.  You may substitute Mederma or other scar reducing creams as desired.   

## 2013-03-05 ENCOUNTER — Telehealth (INDEPENDENT_AMBULATORY_CARE_PROVIDER_SITE_OTHER): Payer: Self-pay

## 2013-03-05 ENCOUNTER — Other Ambulatory Visit (INDEPENDENT_AMBULATORY_CARE_PROVIDER_SITE_OTHER): Payer: Self-pay

## 2013-03-05 DIAGNOSIS — E039 Hypothyroidism, unspecified: Secondary | ICD-10-CM

## 2013-03-05 NOTE — Telephone Encounter (Signed)
Lab slip for TSH mailed to pt for labs around 04-01-13.

## 2013-03-27 ENCOUNTER — Ambulatory Visit (INDEPENDENT_AMBULATORY_CARE_PROVIDER_SITE_OTHER): Payer: BC Managed Care – PPO | Admitting: Physician Assistant

## 2013-03-27 VITALS — BP 120/86 | HR 109 | Temp 98.1°F | Resp 18 | Ht 63.5 in | Wt 275.2 lb

## 2013-03-27 DIAGNOSIS — K760 Fatty (change of) liver, not elsewhere classified: Secondary | ICD-10-CM

## 2013-03-27 DIAGNOSIS — M549 Dorsalgia, unspecified: Secondary | ICD-10-CM

## 2013-03-27 DIAGNOSIS — R209 Unspecified disturbances of skin sensation: Secondary | ICD-10-CM

## 2013-03-27 DIAGNOSIS — Z6841 Body Mass Index (BMI) 40.0 and over, adult: Secondary | ICD-10-CM | POA: Insufficient documentation

## 2013-03-27 DIAGNOSIS — E669 Obesity, unspecified: Secondary | ICD-10-CM

## 2013-03-27 DIAGNOSIS — R202 Paresthesia of skin: Secondary | ICD-10-CM

## 2013-03-27 DIAGNOSIS — K7689 Other specified diseases of liver: Secondary | ICD-10-CM

## 2013-03-27 NOTE — Progress Notes (Signed)
  Subjective:    Patient ID: Emily Mathis, female    DOB: 03-01-1966, 47 y.o.   MRN: [id number]  HPI This 47 y.o. female presents for evaluation of back pain with paresthesias, and for fasting lipids due to the finding of fatty liver on a recent abdominal US.  Since her last visit here, she has seen Dr. Gerrit Friends and calcium level has normalized.  She continues on Synthroid, and will have TSH checked next month.  Back pain is "way better than it was before." She still has pain with "bending the wrong way" or when on her feet for prolonged periods.  Plans to return to work on Monday. No loss of bowel/bladder control.  No saddle anesthesia.  No weakness in the lower extremities.  She's frustrated with her weight and wonders if "I'll ever get this weight off." She asks for advice.  Past medical history, surgical history, family history, social history and problem list reviewed.  Review of Systems As above. No CP, SOB, HA, dizziness.    Objective:   Physical Exam Blood pressure 120/86, pulse 109, temperature 98.1 F (36.7 C), temperature source Oral, resp. rate 18, height 5' 3.5" (1.613 m), weight 275 lb 3.2 oz (124.83 kg), last menstrual period 03/11/2013, SpO2 98.00%. Body mass index is 47.98 kg/(m^2). Well-developed, well nourished BF who is awake, alert and oriented, in NAD. HEENT: [place]/AT, sclera and conjunctiva are clear.   Heart: RRR, no murmur Lungs: normal effort, CTA Extremities: no cyanosis, clubbing or edema. Back: non-tender on palpation. Skin: warm and dry without rash. Psychologic: good mood and appropriate affect, normal speech and behavior.        Assessment & Plan:  Back pain/Paresthesias - much improved.  Continue with good body mechanics.  Encouraged core strengthening as part of her exercise routine to reduce the risk of future injury.  Fatty liver disease, nonalcoholic - Plan: Lipid panel  Obesity - counseled on healthy choices and regular exercise.  If her TSH is  elevated next month, her synthroid dose will be adjusted and may help with continued weight loss.  Fernande Bras, PA-C Physician Assistant-Certified [facility]

## 2013-03-27 NOTE — Patient Instructions (Signed)
Healthy eating and regular exercise (150 minutes/week) can help you be healthy and lose weight.

## 2013-03-28 LAB — LIPID PANEL
Cholesterol: 181 mg/dL (ref 0–200)
Triglycerides: 76 mg/dL (ref <150)
VLDL: 15 mg/dL (ref 0–40)

## 2013-04-01 ENCOUNTER — Telehealth (INDEPENDENT_AMBULATORY_CARE_PROVIDER_SITE_OTHER): Payer: Self-pay | Admitting: *Deleted

## 2013-04-01 ENCOUNTER — Other Ambulatory Visit (INDEPENDENT_AMBULATORY_CARE_PROVIDER_SITE_OTHER): Payer: Self-pay | Admitting: *Deleted

## 2013-04-01 DIAGNOSIS — E039 Hypothyroidism, unspecified: Secondary | ICD-10-CM

## 2013-04-01 NOTE — Telephone Encounter (Signed)
Patient called this morning regarding she has lost her lab slip.  TSH lab ordered for [facility] so patient instructed to just go to [facility] and order would be there.  Patient given directions to [facility] at this time and states understanding.

## 2013-04-02 ENCOUNTER — Telehealth (INDEPENDENT_AMBULATORY_CARE_PROVIDER_SITE_OTHER): Payer: Self-pay

## 2013-04-02 NOTE — Telephone Encounter (Signed)
TSH result 36.217 sent to Dr Gerrit Friends to review and advise. Pts f/u appt is 06-09-13.

## 2013-04-03 ENCOUNTER — Encounter (INDEPENDENT_AMBULATORY_CARE_PROVIDER_SITE_OTHER): Payer: Self-pay | Admitting: Surgery

## 2013-04-03 ENCOUNTER — Other Ambulatory Visit (INDEPENDENT_AMBULATORY_CARE_PROVIDER_SITE_OTHER): Payer: Self-pay

## 2013-04-03 DIAGNOSIS — E890 Postprocedural hypothyroidism: Secondary | ICD-10-CM | POA: Insufficient documentation

## 2013-04-03 DIAGNOSIS — E042 Nontoxic multinodular goiter: Secondary | ICD-10-CM

## 2013-04-03 MED ORDER — LEVOTHYROXINE SODIUM 150 MCG PO TABS
150.0000 ug | ORAL_TABLET | Freq: Every day | ORAL | Status: DC
Start: 2013-04-03 — End: 2013-06-15

## 2013-04-03 NOTE — Telephone Encounter (Signed)
Patient is aware of synthroid increased to 150 mcg.

## 2013-04-03 NOTE — Telephone Encounter (Signed)
LMOM for pt to call. Per Dr Gerrit Friends TSH remains high at 36.217. Will send change of synthroid dosage of to pts pharmacy.

## 2013-04-03 NOTE — Telephone Encounter (Signed)
Dosage on synthroid increased from 112 mcg  to 150 mcg per Dr Gerrit Friends in epic.

## 2013-04-03 NOTE — Telephone Encounter (Signed)
TSH remains elevated on Synthroid 112 mcg daily.  Will increase dose to 150 mcg daily and check a TSH in 6 weeks.  Velora Heckler, MD, [facility] Office: [phone]

## 2013-04-05 ENCOUNTER — Other Ambulatory Visit: Payer: Self-pay | Admitting: Physician Assistant

## 2013-04-07 ENCOUNTER — Other Ambulatory Visit: Payer: Self-pay | Admitting: Physician Assistant

## 2013-04-21 ENCOUNTER — Encounter (INDEPENDENT_AMBULATORY_CARE_PROVIDER_SITE_OTHER): Payer: Self-pay | Admitting: Surgery

## 2013-04-23 ENCOUNTER — Telehealth (INDEPENDENT_AMBULATORY_CARE_PROVIDER_SITE_OTHER): Payer: Self-pay

## 2013-04-23 NOTE — Telephone Encounter (Signed)
I called pt to ck arm symptoms. Pt states slightly improving. Pt advised to see PCP if symptoms do not continue to resolve. Pt states she understands.

## 2013-06-09 ENCOUNTER — Ambulatory Visit (INDEPENDENT_AMBULATORY_CARE_PROVIDER_SITE_OTHER): Payer: BC Managed Care – PPO | Admitting: Surgery

## 2013-06-09 ENCOUNTER — Encounter (INDEPENDENT_AMBULATORY_CARE_PROVIDER_SITE_OTHER): Payer: Self-pay | Admitting: Surgery

## 2013-06-09 VITALS — BP 138/88 | HR 72 | Temp 98.2°F | Resp 14 | Ht 63.0 in | Wt 280.2 lb

## 2013-06-09 DIAGNOSIS — E890 Postprocedural hypothyroidism: Secondary | ICD-10-CM

## 2013-06-09 NOTE — Patient Instructions (Signed)
Hydrocortisone cream (2% or 5%) applied to incision daily.  Velora Heckler, MD, [facility] Office: [phone]

## 2013-06-09 NOTE — Progress Notes (Signed)
General Surgery [facility]  Chief Complaint  Patient presents with  . Follow-up    total thyroidectomy - April 2014    HISTORY: Patient is a 47 year old female who underwent total thyroidectomy in April 2014. Postoperatively she has done well although she continues to have moderate hoarseness. This has improved since we increased her Synthroid dosage to 150 mcg daily in July 2014. She has not had a TSH level drawn since that dosage change.  EXAM: Surgical incision is well-healed. There is mild keloid formation along the extent of the incision. This is mildly tender. There is no sign of seroma. There is no sign of infection. Voice is almost normal at conversational level although there remains a slight rasp.  IMPRESSION: #1 status post total thyroidectomy #2 persistent hoarseness postoperatively #3 post surgical hypothyroidism  PLAN: The patient will have a TSH level drawn this week. We will contact her with the results and adjust her Synthroid dosage if necessary.  Patient will return for wound check and assessment of voice quality in 3 months. If her voice fails to improve further, we may consider consultation with ENT at [facility] for assessment.  I've asked the patient to begin applying topical hydrocortisone cream to her incision to help with keloid formation.  Velora Heckler, MD, FACS General & Endocrine Surgery [facility]   Visit Diagnoses: 1. Hypothyroidism, postsurgical

## 2013-06-11 ENCOUNTER — Telehealth (INDEPENDENT_AMBULATORY_CARE_PROVIDER_SITE_OTHER): Payer: Self-pay

## 2013-06-11 LAB — TSH: TSH: 4.14 u[IU]/mL (ref 0.450–4.500)

## 2013-06-11 NOTE — Telephone Encounter (Signed)
TSH from [facility] drawn 06-10-13 = 4.140 . Will send to Dr Gerrit Friends to review and advise any medication changes or f/u.

## 2013-06-14 ENCOUNTER — Encounter (HOSPITAL_COMMUNITY): Payer: Self-pay | Admitting: Emergency Medicine

## 2013-06-14 ENCOUNTER — Emergency Department (HOSPITAL_COMMUNITY): Payer: BC Managed Care – PPO

## 2013-06-14 ENCOUNTER — Emergency Department (HOSPITAL_COMMUNITY)
Admission: EM | Admit: 2013-06-14 | Discharge: 2013-06-14 | Disposition: A | Discharge status: Home / Self Care | Payer: BC Managed Care – PPO | Attending: Emergency Medicine | Admitting: Emergency Medicine

## 2013-06-14 ENCOUNTER — Other Ambulatory Visit: Payer: Self-pay

## 2013-06-14 DIAGNOSIS — E876 Hypokalemia: Secondary | ICD-10-CM | POA: Insufficient documentation

## 2013-06-14 DIAGNOSIS — I10 Essential (primary) hypertension: Secondary | ICD-10-CM | POA: Insufficient documentation

## 2013-06-14 DIAGNOSIS — Z8659 Personal history of other mental and behavioral disorders: Secondary | ICD-10-CM | POA: Insufficient documentation

## 2013-06-14 DIAGNOSIS — Z862 Personal history of diseases of the blood and blood-forming organs and certain disorders involving the immune mechanism: Secondary | ICD-10-CM | POA: Insufficient documentation

## 2013-06-14 DIAGNOSIS — R072 Precordial pain: Secondary | ICD-10-CM | POA: Insufficient documentation

## 2013-06-14 DIAGNOSIS — Z8543 Personal history of malignant neoplasm of ovary: Secondary | ICD-10-CM | POA: Insufficient documentation

## 2013-06-14 DIAGNOSIS — R079 Chest pain, unspecified: Secondary | ICD-10-CM

## 2013-06-14 DIAGNOSIS — J45901 Unspecified asthma with (acute) exacerbation: Secondary | ICD-10-CM

## 2013-06-14 DIAGNOSIS — Z8639 Personal history of other endocrine, nutritional and metabolic disease: Secondary | ICD-10-CM | POA: Insufficient documentation

## 2013-06-14 DIAGNOSIS — Z91040 Latex allergy status: Secondary | ICD-10-CM | POA: Insufficient documentation

## 2013-06-14 DIAGNOSIS — Z79899 Other long term (current) drug therapy: Secondary | ICD-10-CM | POA: Insufficient documentation

## 2013-06-14 DIAGNOSIS — Z791 Long term (current) use of non-steroidal anti-inflammatories (NSAID): Secondary | ICD-10-CM | POA: Insufficient documentation

## 2013-06-14 DIAGNOSIS — R1110 Vomiting, unspecified: Secondary | ICD-10-CM | POA: Insufficient documentation

## 2013-06-14 DIAGNOSIS — M129 Arthropathy, unspecified: Secondary | ICD-10-CM | POA: Insufficient documentation

## 2013-06-14 DIAGNOSIS — K219 Gastro-esophageal reflux disease without esophagitis: Secondary | ICD-10-CM | POA: Insufficient documentation

## 2013-06-14 LAB — POCT I-STAT TROPONIN I: Troponin i, poc: 0 ng/mL (ref 0.00–0.08)

## 2013-06-14 LAB — BASIC METABOLIC PANEL
BUN: 12 mg/dL (ref 6–23)
Calcium: 8 mg/dL — ABNORMAL LOW (ref 8.4–10.5)
GFR calc Af Amer: >90 mL/min (ref >90)
GFR calc non Af Amer: >90 mL/min (ref >90)
Potassium: 3.1 mEq/L — ABNORMAL LOW (ref 3.5–5.1)
Sodium: 140 mEq/L (ref 135–145)

## 2013-06-14 LAB — CBC
HCT: 38.5 % (ref 36.0–46.0)
MCH: 31.7 pg (ref 26.0–34.0)
MCHC: 34 g/dL (ref 30.0–36.0)
Platelets: 448 10*3/uL — ABNORMAL HIGH (ref 150–400)
RDW: 14 % (ref 11.5–15.5)

## 2013-06-14 LAB — D-DIMER, QUANTITATIVE: D-Dimer, Quant: 0.27 ug/mL-FEU (ref 0.00–0.48)

## 2013-06-14 MED ORDER — KETOROLAC TROMETHAMINE 30 MG/ML IJ SOLN
15.0000 mg | Freq: Once | INTRAMUSCULAR | Status: AC
  Administered 2013-06-14: 15 mg via INTRAVENOUS
  Filled 2013-06-14: qty 1

## 2013-06-14 MED ORDER — PREDNISONE 20 MG PO TABS
60.0000 mg | ORAL_TABLET | Freq: Once | ORAL | Status: AC
  Administered 2013-06-14: 60 mg via ORAL
  Filled 2013-06-14: qty 3

## 2013-06-14 MED ORDER — ONDANSETRON HCL 4 MG/2ML IJ SOLN
4.0000 mg | Freq: Once | INTRAMUSCULAR | Status: AC
  Administered 2013-06-14: 4 mg via INTRAVENOUS
  Filled 2013-06-14: qty 2

## 2013-06-14 MED ORDER — ALBUTEROL SULFATE (5 MG/ML) 0.5% IN NEBU
5.0000 mg | INHALATION_SOLUTION | Freq: Once | RESPIRATORY_TRACT | Status: AC
  Administered 2013-06-14: 5 mg via RESPIRATORY_TRACT
  Filled 2013-06-14: qty 1

## 2013-06-14 MED ORDER — IPRATROPIUM BROMIDE 0.02 % IN SOLN
0.5000 mg | Freq: Once | RESPIRATORY_TRACT | Status: AC
  Administered 2013-06-14: 0.5 mg via RESPIRATORY_TRACT
  Filled 2013-06-14: qty 2.5

## 2013-06-14 MED ORDER — MORPHINE SULFATE 4 MG/ML IJ SOLN
6.0000 mg | Freq: Once | INTRAMUSCULAR | Status: AC
  Administered 2013-06-14: 6 mg via INTRAVENOUS
  Filled 2013-06-14: qty 2

## 2013-06-14 MED ORDER — POTASSIUM CHLORIDE CRYS ER 20 MEQ PO TBCR
40.0000 meq | EXTENDED_RELEASE_TABLET | Freq: Once | ORAL | Status: AC
  Administered 2013-06-14: 40 meq via ORAL
  Filled 2013-06-14: qty 2

## 2013-06-14 MED ORDER — TRAMADOL HCL 50 MG PO TABS: 50.0000 mg | ORAL_TABLET | Freq: Four times a day (QID) | ORAL | Status: DC | PRN

## 2013-06-14 MED ORDER — POTASSIUM CHLORIDE CRYS ER 20 MEQ PO TBCR: 40.0000 meq | EXTENDED_RELEASE_TABLET | Freq: Once | ORAL | Status: DC

## 2013-06-14 MED ORDER — PREDNISONE 20 MG PO TABS: 40.0000 mg | ORAL_TABLET | Freq: Every day | ORAL | Status: DC

## 2013-06-14 MED ORDER — POTASSIUM CHLORIDE CRYS ER 20 MEQ PO TBCR: 20.0000 meq | EXTENDED_RELEASE_TABLET | Freq: Every day | ORAL | Status: DC

## 2013-06-14 NOTE — ED Notes (Signed)
Pt. reports mid chest pain radiating to mid back with SOB and emesis onset Thursday.

## 2013-06-14 NOTE — ED Provider Notes (Signed)
CSN: [id number]     Arrival date & time 06/14/13  2012 History   First MD Initiated Contact with Patient 06/14/13 2020     Chief Complaint  Patient presents with  . Chest Pain   (Consider location/radiation/quality/duration/timing/severity/associated sxs/prior Treatment) HPI  Emily Mathis is a 47 y.o. female complaining of sharp substernal chest pain radiating to the back onset 3 days ago it resolved then returned yesterday, and has been constant since then. Associated with shortness of breath and 2 episodes of nonbloody, nonbilious, non-coffee ground emesis today. Patient endorses a dry cough, she has a history of asthma with no intubations or hospitalizations. Denies wheezing, fever, abd pain, change in bowel or bladder habits, h/o DVT or PE, recent immobilization, exogenous estrogen or calf pain or swelling.    Past Medical History  Diagnosis Date  . Hyperlipidemia   . Asthma   . Thyroid disease   . Arthritis   . Hypertension     per Dr. Verl Dicker note 08/30/2011  . Anxiety   . GERD (gastroesophageal reflux disease)   . Headache(784.0)   . Cancer     Pre-Cancer-Ovarian   Past Surgical History  Procedure Laterality Date  . Cesarean section  1995  . Cesarean section  1996  . Cardiac catheterization  2013  . Thyroidectomy N/A 12/18/2012    Procedure: TOTAL THYROIDECTOMY;  Surgeon: Velora Heckler, MD;  Location: [facility];  Service: General;  Laterality: N/A;   Family History  Problem Relation Age of Onset  . Heart disease Mother   . Stroke Mother   . Heart disease Father   . Colon cancer Father   . Breast cancer Paternal Aunt   . Cancer Paternal Grandmother   . Cancer Paternal Aunt    History  Substance Use Topics  . Smoking status: Never Smoker   . Smokeless tobacco: Never Used  . Alcohol Use: Yes     Comment: Occasional   OB History   Grav Para Term Preterm Abortions TAB SAB Ect Mult Living                 Review of Systems 10 systems reviewed and found to be  negative, except as noted in the HPI  Allergies  Latex; Dilaudid; and Hydrocodone  Home Medications   Current Outpatient Rx  Name  Route  Sig  Dispense  Refill  . albuterol (PROVENTIL HFA;VENTOLIN HFA) 108 (90 BASE) MCG/ACT inhaler   Inhalation   Inhale 2 puffs into the lungs daily as needed. For shortness of breath         . calcitRIOL (ROCALTROL) 0.25 MCG capsule   Oral   Take 1 capsule (0.25 mcg total) by mouth 2 (two) times daily.   30 capsule   1   . calcium carbonate (OS-CAL - DOSED IN MG OF ELEMENTAL CALCIUM) 1250 MG tablet   Oral   Take 3 tablets (1,500 mg of elemental calcium total) by mouth 4 (four) times daily.   120 tablet   3   . chlorthalidone (HYGROTON) 25 MG tablet      TAKE 1 TABLET (25 MG TOTAL) BY MOUTH DAILY.   30 tablet   1   . levothyroxine (SYNTHROID) 150 MCG tablet   Oral   Take 1 tablet (150 mcg total) by mouth daily before breakfast.   30 tablet   6   . meloxicam (MOBIC) 15 MG tablet   Oral   Take 15 mg by mouth daily.         Marland Kitchen  omeprazole (PRILOSEC) 20 MG capsule   Oral   Take 20 mg by mouth daily. Take 2 daily-patient been taking one when she needs it only.          BP 180/150  Pulse 104  Resp 34  SpO2 100%  LMP 03/11/2013 Physical Exam  Nursing note and vitals reviewed. Constitutional: She is oriented to person, place, and time. She appears well-developed and well-nourished. No distress.  Appears uncomfortable, crying  HENT:  Head: Normocephalic.  Mouth/Throat: Oropharynx is clear and moist.  Eyes: Conjunctivae and EOM are normal. Pupils are equal, round, and reactive to light.  Cardiovascular: Normal rate.   Pulmonary/Chest: Effort normal. No stridor. No respiratory distress. She has no wheezes. She has no rales. She exhibits tenderness.  Used air movement in all fields, no wheezing.  She is exquisitely tender to palpation on the sternum.  Abdominal: Soft. Bowel sounds are normal. She exhibits no distension and no  mass. There is no tenderness. There is no rebound and no guarding.  Musculoskeletal: Normal range of motion.  No calf asymmetry, superficial collaterals, palpable cords, edema, Homans sign negative bilaterally.    Neurological: She is alert and oriented to person, place, and time.  Psychiatric: She has a normal mood and affect.    ED Course  Procedures (including critical care time) Labs Review Labs Reviewed  CBC - Abnormal; Notable for the following:    WBC 14.8 (*)    Platelets 448 (*)    All other components within normal limits  BASIC METABOLIC PANEL - Abnormal; Notable for the following:    Potassium 3.1 (*)    Glucose, Bld 141 (*)    Calcium 8.0 (*)    All other components within normal limits  D-DIMER, QUANTITATIVE  PRO B NATRIURETIC PEPTIDE  POCT I-STAT TROPONIN I   Imaging Review Dg Chest 2 View  06/14/2013   CLINICAL DATA:  Chest pain, back pain, cough, shortness of Breath.  EXAM: CHEST  2 VIEW  COMPARISON:  12/24/2012  FINDINGS: The heart size and mediastinal contours are within normal limits. Both lungs are clear. The visualized skeletal structures are unremarkable.  IMPRESSION: No active cardiopulmonary disease.   Electronically Signed   By: Charlett Nose M.D.   On: 06/14/2013 22:21     Date: 06/14/2013  Rate: 107  Rhythm: normal sinus rhythm  QRS Axis: normal  Intervals: normal  ST/T Wave abnormalities: nonspecific ST changes ST depression in the inferior leads with no reciprocal changes, unchanged from 12/24/2012  Conduction Disutrbances:none  Narrative Interpretation:   Old EKG Reviewed: unchanged  10:02 PM patient's lung sounds are significantly improved after prednisone and DuoNeb. States that her pain is still 9/10.  MDM   1. Asthma exacerbation   2. Chest pain   3. Hypokalemia     Filed Vitals:   06/14/13 2030 06/14/13 2031 06/14/13 2125 06/14/13 2143  BP: 145/74 141/86 129/96 144/78  Pulse: 97 102 98 98  Resp: 18 20 13 22   SpO2: 100% 100% 100%       Emily Mathis is a 48 y.o. female with PMH significant for asthma c/o intermittent substernal CP radiating to the back assocaited with SOB. Pt is in no respiratory distress but is very tight with poor air movement in all fields. EKG is nonischemic, troponin is negative (chest pain has been constant and just since yesterday) patient has a mild leukocytosis of 14.8, d-dimer is negative and she is in mild hypokalemia at 3.1. Chest x-ray shows no  infiltrate. I doubt there is any acute condition to require intervention or admission to the hospital at this point. She significantly improved after 2 nebulizer treatments and lung sounds are clear and she is moving good air in all fields.  This is a shared visit with the attending physician who personally evaluated the patient and agrees with the care plan.   Medications  potassium chloride SA (K-DUR,KLOR-CON) CR tablet 40 mEq (not administered)  ketorolac (TORADOL) 30 MG/ML injection 15 mg (not administered)  predniSONE (DELTASONE) tablet 60 mg (60 mg Oral Given 06/14/13 2044)  albuterol (PROVENTIL) (5 MG/ML) 0.5% nebulizer solution 5 mg (5 mg Nebulization Given 06/14/13 2045)  ipratropium (ATROVENT) nebulizer solution 0.5 mg (0.5 mg Nebulization Given 06/14/13 2045)  morphine 4 MG/ML injection 6 mg (6 mg Intravenous Given 06/14/13 2159)  ondansetron (ZOFRAN) injection 4 mg (4 mg Intravenous Given 06/14/13 2159)   Pt is hemodynamically stable, appropriate for, and amenable to discharge at this time. Pt verbalized understanding and agrees with care plan. All questions answered. Outpatient follow-up and specific return precautions discussed.    New Prescriptions   POTASSIUM CHLORIDE SA (K-DUR,KLOR-CON) 20 MEQ TABLET    Take 1 tablet (20 mEq total) by mouth daily.   PREDNISONE (DELTASONE) 20 MG TABLET    Take 2 tablets (40 mg total) by mouth daily.   TRAMADOL (ULTRAM) 50 MG TABLET    Take 1 tablet (50 mg total) by mouth every 6 (six) hours as needed for  pain.    Note: Portions of this report may have been transcribed using voice recognition software. Every effort was made to ensure accuracy; however, inadvertent computerized transcription errors may be present      Wynetta Emery, PA-C 06/14/13 2322

## 2013-06-14 NOTE — ED Provider Notes (Signed)
Medical screening examination/treatment/procedure(s) were conducted as a shared visit with non-physician practitioner(s) and myself.  I personally evaluated the patient during the encounter.  Patient here with constant CP since yesterday, SOB with hx of asthma. Patient with improved symptoms post nebulizer. Dimer negative. Single troponin negative, single troponin sufficient since pain constant since yesterday. Patient stable for discharge.    Angiocath insertion Performed by: Dagmar Hait  Consent: Verbal consent obtained. Risks and benefits: risks, benefits and alternatives were discussed Time out: Immediately prior to procedure a "time out" was called to verify the correct patient, procedure, equipment, support staff and site/side marked as required.  Preparation: Patient was prepped and draped in the usual sterile fashion.  Vein Location: L AC  Yes Ultrasound Guided  Gauge: 20 g  Normal blood return and flush without difficulty Patient tolerance: Patient tolerated the procedure well with no immediate complications.     Dagmar Hait, MD 06/14/13 [phone]

## 2013-06-14 NOTE — ED Notes (Signed)
Dr. Gwendolyn Grant to attempt IV insertion.

## 2013-06-14 NOTE — ED Notes (Signed)
Attempted IV start, unsuccessful. 2nd RN to attempt.

## 2013-06-15 ENCOUNTER — Other Ambulatory Visit (INDEPENDENT_AMBULATORY_CARE_PROVIDER_SITE_OTHER): Payer: Self-pay

## 2013-06-15 DIAGNOSIS — E039 Hypothyroidism, unspecified: Secondary | ICD-10-CM

## 2013-06-15 LAB — POCT I-STAT TROPONIN I: Troponin i, poc: 0 ng/mL (ref 0.00–0.08)

## 2013-06-15 MED ORDER — LEVOTHYROXINE SODIUM 175 MCG PO TABS
175.0000 ug | ORAL_TABLET | Freq: Every day | ORAL | Status: DC
Start: 2013-06-15 — End: 2014-05-11

## 2013-06-15 NOTE — Telephone Encounter (Signed)
Pt advised of lab result and need to increase synthroid dosage to 175 mcg per Dr Gerrit Friends. Pt also advised lab slip will be mailed to pt to repeat labs in 6 weeks. Pt states she understands.

## 2013-06-15 NOTE — Telephone Encounter (Signed)
TSH level is 4.140 on her present dose of Synthroid 150 mcg daily.  Will increase to Synthroid 175 mcg daily.  Repeat TSH in 6 weeks.  Patient requests TSH level be drawn at [facility].  Arline Asp - please arrange the above dosage change and follow up lab work.  Thanks,  tmg  Velora Heckler, MD, [facility] Office: [phone]

## 2013-06-18 ENCOUNTER — Ambulatory Visit (INDEPENDENT_AMBULATORY_CARE_PROVIDER_SITE_OTHER): Payer: BC Managed Care – PPO | Admitting: Physician Assistant

## 2013-06-18 VITALS — BP 140/90 | HR 94 | Temp 98.5°F | Resp 16 | Ht 63.0 in | Wt 278.0 lb

## 2013-06-18 DIAGNOSIS — J45909 Unspecified asthma, uncomplicated: Secondary | ICD-10-CM

## 2013-06-18 DIAGNOSIS — E876 Hypokalemia: Secondary | ICD-10-CM

## 2013-06-18 DIAGNOSIS — M62838 Other muscle spasm: Secondary | ICD-10-CM

## 2013-06-18 DIAGNOSIS — R739 Hyperglycemia, unspecified: Secondary | ICD-10-CM

## 2013-06-18 MED ORDER — BECLOMETHASONE DIPROPIONATE 80 MCG/ACT IN AERS: 2.0000 | INHALATION_SPRAY | Freq: Two times a day (BID) | RESPIRATORY_TRACT | Status: DC

## 2013-06-18 NOTE — Patient Instructions (Signed)
Use the NEW inhaler, 2 puffs twice each day. Use the albuterol every 4 hours AS NEEDED if you have shortness of breath, wheezing, chest tightness or cough.  If it doesn't help, go to the emergency department or call 911.

## 2013-06-18 NOTE — Progress Notes (Signed)
Subjective:    Patient ID: Emily Mathis, female    DOB: December 19, 1965, 47 y.o.   MRN: [id number]  HPI  This 47 y.o. female presents for ED follow-up.  Symptoms began about a week ago.  Chest pressure, which she thought was gas, radiating into the back. Emily Mathis out of town for a wedding, but had to leave early, on Sunday 9/28.  Her symptoms worsened during travel, she vomited, and then wasn't able to breathe.    She went to the ED, where she reports she was told she was having an asthma attack, had fluid on her lungs, and her potassium was low.  Her ED notes are reviewed, and heart and lung exam were documented as normal, and CXR was read as normal without infiltrate or other acute cardiopulmonary process.   Results for orders placed during the hospital encounter of 06/14/13  CBC      Result Value Range   WBC 14.8 (*) 4.0 - 10.5 K/uL   RBC 4.13  3.87 - 5.11 MIL/uL   Hemoglobin 13.1  12.0 - 15.0 g/dL   HCT 40.9  81.1 - 91.4 %   MCV 93.2  78.0 - 100.0 fL   MCH 31.7  26.0 - 34.0 pg   MCHC 34.0  30.0 - 36.0 g/dL   RDW 78.2  95.6 - 21.3 %   Platelets 448 (*) 150 - 400 K/uL  BASIC METABOLIC PANEL      Result Value Range   Sodium 140  135 - 145 mEq/L   Potassium 3.1 (*) 3.5 - 5.1 mEq/L   Chloride 102  96 - 112 mEq/L   CO2 25  19 - 32 mEq/L   Glucose, Bld 141 (*) 70 - 99 mg/dL   BUN 12  6 - 23 mg/dL   Creatinine, Ser 0.86  0.50 - 1.10 mg/dL   Calcium 8.0 (*) 8.4 - 10.5 mg/dL   GFR calc non Af Amer >90  >90 mL/min   GFR calc Af Amer >90  >90 mL/min  D-DIMER, QUANTITATIVE      Result Value Range   D-Dimer, Quant 0.27  0.00 - 0.48 ug/mL-FEU  POCT I-STAT TROPONIN I      Result Value Range   Troponin i, poc 0.00  0.00 - 0.08 ng/mL   Comment 3           POCT I-STAT TROPONIN I      Result Value Range   Troponin i, poc 0.00  0.00 - 0.08 ng/mL   Comment 3             She received 2 nebulized treatments with improvement of her symptoms. She was given K-Dur 20 meq qd x 3 days, Prednisone 20  mg, 2 tablets qd x 5 days, and tramadol 50 mg 1 tablet q 6 hours prn pain.   She reports that she was advised to follow-up with Dr. Evlyn Kanner in 48 hours, but she's not yet been released by Dr. Gerrit Friends from her thyroidectomy, so didn't call to schedule.   She is still having dyspnea on exertion, and reports persistent muscle cramps (these present since 12/2012 after thyroidectomy when she had profound hypocalcemia, which has resolved.  Dr. Gerrit Friends just increased the levothyroxine to 175 mcg this week (TSH was 4.140, down from 36.217 2 months ago).  Review of Systems As above.    Objective:   Physical Exam  Blood pressure 140/90, pulse 94, temperature 98.5 F (36.9 C), temperature source Oral, resp. rate 16, height  5\' 3"  (1.6 m), weight 278 lb (126.1 kg), last menstrual period 03/11/2013, SpO2 96.00%. Body mass index is 49.26 kg/(m^2). Well-developed, well nourished obese BF who is awake, alert and oriented, in NAD. HEENT: [place]/AT, PERRL, EOMI.  Sclera and conjunctiva are clear.  EAC are patent, TMs are normal in appearance. Nasal mucosa is pink and moist. OP is clear. Neck: supple, non-tender, no lymphadenopathy, thyromegaly. Heart: RRR, no murmur Lungs: normal effort, CTA Extremities: no cyanosis, clubbing or edema. Skin: warm and dry without rash. Psychologic: good mood and appropriate affect, normal speech and behavior.       Assessment & Plan:  Unspecified asthma(493.90) - She has an albuterol inhaler to use Q 4 hours prn cough, SOB, wheezing. Plan: beclomethasone (QVAR) 80 MCG/ACT inhaler, 2 puffs BID.  RTC 1 week for re-evaluation.  Hypokalemia - Plan: Basic metabolic panel  Muscle spasm - continue to hydrate.  Exercise encouraged.  Fernande Bras, PA-C Physician Assistant-Certified [facility]

## 2013-06-19 ENCOUNTER — Other Ambulatory Visit: Payer: Self-pay | Admitting: Physician Assistant

## 2013-06-19 LAB — BASIC METABOLIC PANEL
BUN: 18 mg/dL (ref 6–23)
CO2: 32 mEq/L (ref 19–32)
Calcium: 8.7 mg/dL (ref 8.4–10.5)
Glucose, Bld: 116 mg/dL — ABNORMAL HIGH (ref 70–99)
Sodium: 137 mEq/L (ref 135–145)

## 2013-06-23 NOTE — Addendum Note (Signed)
Addended by: Fernande Bras on: 06/23/2013 09:26 AM   Modules accepted: Orders

## 2013-06-24 ENCOUNTER — Encounter (INDEPENDENT_AMBULATORY_CARE_PROVIDER_SITE_OTHER): Payer: Self-pay

## 2013-07-02 ENCOUNTER — Ambulatory Visit (INDEPENDENT_AMBULATORY_CARE_PROVIDER_SITE_OTHER): Payer: BC Managed Care – PPO | Admitting: Physician Assistant

## 2013-07-02 VITALS — BP 115/68 | HR 107 | Temp 99.0°F | Resp 16 | Ht 63.5 in | Wt 272.0 lb

## 2013-07-02 DIAGNOSIS — R0602 Shortness of breath: Secondary | ICD-10-CM

## 2013-07-02 DIAGNOSIS — F430 Acute stress reaction: Secondary | ICD-10-CM | POA: Insufficient documentation

## 2013-07-02 DIAGNOSIS — Z23 Encounter for immunization: Secondary | ICD-10-CM

## 2013-07-02 DIAGNOSIS — R739 Hyperglycemia, unspecified: Secondary | ICD-10-CM

## 2013-07-02 DIAGNOSIS — R7309 Other abnormal glucose: Secondary | ICD-10-CM

## 2013-07-02 DIAGNOSIS — E119 Type 2 diabetes mellitus without complications: Secondary | ICD-10-CM

## 2013-07-02 LAB — GLUCOSE, POCT (MANUAL RESULT ENTRY): POC Glucose: 127 mg/dl — AB (ref 70–99)

## 2013-07-02 LAB — POCT GLYCOSYLATED HEMOGLOBIN (HGB A1C): Hemoglobin A1C: 6.4

## 2013-07-02 MED ORDER — ALPRAZOLAM 0.5 MG PO TABS: 0.5000 mg | ORAL_TABLET | Freq: Every evening | ORAL | Status: DC | PRN

## 2013-07-02 MED ORDER — METFORMIN HCL 500 MG PO TABS: 500.0000 mg | ORAL_TABLET | Freq: Every day | ORAL | Status: DC
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
## 2013-07-02 NOTE — Patient Instructions (Addendum)
Keep up the great work with exercise! Increase your exercise by a couple of minutes each week or so. Work on Manufacturing systems engineer. Influenza Vaccine (Flu Vaccine, Inactivated) 2013 2014 What You Need to Know WHY GET VACCINATED?  Influenza ("flu") is a contagious disease that spreads around the [place] every winter, usually between October and May.  Flu is caused by the influenza virus, and can be spread by coughing, sneezing, and close contact.  Anyone can get flu, but the risk of getting flu is highest among children. Symptoms come on suddenly and may last several days. They can include:  Fever or chills.  Sore throat.  Muscle aches.  Fatigue.  Cough.  Headache.  Runny or stuffy nose. Flu can make some people much sicker than others. These people include young children, people 60 and older, pregnant women, and people with certain health conditions such as heart, lung or kidney disease, or a weakened immune system. Flu vaccine is especially important for these people, and anyone in close contact with them. Flu can also lead to pneumonia, and make existing medical conditions worse. It can cause diarrhea and seizures in children. Each year thousands of people in the [place] die from flu, and many more are hospitalized. Flu vaccine is the best protection we have from flu and its complications. Flu vaccine also helps prevent spreading flu from person to person. INACTIVATED FLU VACCINE There are 2 types of influenza vaccine:  You are getting an inactivated flu vaccine, which does not contain any live influenza virus. It is given by injection with a needle, and often called the "flu shot."  A different live, attenuated (weakened) influenza vaccine is sprayed into the nostrils. This vaccine is described in a separate Vaccine Information Statement. Flu vaccine is recommended every year. Children 6 months through 69 years of age should get 2 doses the first year they  get vaccinated. Flu viruses are always changing. Each year's flu vaccine is made to protect from viruses that are most likely to cause disease that year. While flu vaccine cannot prevent all cases of flu, it is our best defense against the disease. Inactivated flu vaccine protects against 3 or 4 different influenza viruses. It takes about 2 weeks for protection to develop after the vaccination, and protection lasts several months to a year. Some illnesses that are not caused by influenza virus are often mistaken for flu. Flu vaccine will not prevent these illnesses. It can only prevent influenza. A "high-dose" flu vaccine is available for people 69 years of age and older. The person giving you the vaccine can tell you more about it. Some inactivated flu vaccine contains a very small amount of a mercury-based preservative called thimerosal. Studies have shown that thimerosal in vaccines is not harmful, but flu vaccines that do not contain a preservative are available. SOME PEOPLE SHOULD NOT GET THIS VACCINE Tell the person who gives you the vaccine:  If you have any severe (life-threatening) allergies. If you ever had a life-threatening allergic reaction after a dose of flu vaccine, or have a severe allergy to any part of this vaccine, you may be advised not to get a dose. Most, but not all, types of flu vaccine contain a small amount of egg.  If you ever had Guillain Barr Syndrome (a severe paralyzing illness, also called GBS). Some people with a history of GBS should not get this vaccine. This should be discussed with your doctor.  If you are not feeling well.  They might suggest waiting until you feel better. But you should come back. RISKS OF A VACCINE REACTION With a vaccine, like any medicine, there is a chance of side effects. These are usually mild and go away on their own. Serious side effects are also possible, but are very rare. Inactivated flu vaccine does not contain live flu virus,  sogetting flu from this vaccine is not possible. Brief fainting spells and related symptoms (such as jerking movements) can happen after any medical procedure, including vaccination. Sitting or lying down for about 15 minutes after a vaccination can help prevent fainting and injuries caused by falls. Tell your doctor if you feel dizzy or lightheaded, or have vision changes or ringing in the ears. Mild problems following inactivated flu vaccine:  Soreness, redness, or swelling where the shot was given.  Hoarseness; sore, red or itchy eyes; or cough.  Fever.  Aches.  Headache.  Itching.  Fatigue. If these problems occur, they usually begin soon after the shot and last 1 or 2 days. Moderate problems following inactivated flu vaccine:  Young children who get inactivated flu vaccine and pneumococcal vaccine (PCV13) at the same time may be at increased risk for seizures caused by fever. Ask your doctor for more information. Tell your doctor if a child who is getting flu vaccine has ever had a seizure. Severe problems following inactivated flu vaccine:  A severe allergic reaction could occur after any vaccine (estimated less than 1 in a million doses).  There is a small possibility that inactivated flu vaccine could be associated with Guillan Barr Syndrome (GBS), no more than 1 or 2 cases per million people vaccinated. This is much lower than the risk of severe complications from flu, which can be prevented by flu vaccine. The safety of vaccines is always being monitored. For more information, visit: http://floyd.org/ WHAT IF THERE IS A SERIOUS REACTION? What should I look for?  Look for anything that concerns you, such as signs of a severe allergic reaction, very high fever, or behavior changes. Signs of a severe allergic reaction can include hives, swelling of the face and throat, difficulty breathing, a fast heartbeat, dizziness, and weakness. These would start a few minutes to a  few hours after the vaccination. What should I do?  If you think it is a severe allergic reaction or other emergency that cannot wait, call [phone] 1 1  or get the person to the nearest hospital. Otherwise, call your doctor.  Afterward, the reaction should be reported to the Vaccine Adverse Event Reporting System (VAERS). Your doctor might file this report, or you can do it yourself through the VAERS website at www.vaers.LAgents.no, or by calling 1-(260) 551-7365. VAERS is only for reporting reactions. They do not give medical advice. THE NATIONAL VACCINE INJURY COMPENSATION PROGRAM The National Vaccine Injury Compensation Program (VICP) is a federal program that was created to compensate people who may have been injured by certain vaccines. Persons who believe they may have been injured by a vaccine can learn about the program and about filing a claim by calling 1-726-143-7289 or visiting the VICP website at SpiritualWord.at HOW CAN I LEARN MORE?  Ask your doctor.  Call your local or state health department.  Contact the Centers for Disease Control and Prevention (CDC):  Call 2795811977 (1-800-CDC-INFO) or  Visit CDC's website at BiotechRoom.com.cy CDC Inactivated Influenza Vaccine Interim VIS (04/11/12) Document Released: 06/28/2006 Document Revised: 05/28/2012 Document Reviewed: 04/11/2012 [facility] Patient Information 2014 [place], [place].

## 2013-07-15 ENCOUNTER — Ambulatory Visit (INDEPENDENT_AMBULATORY_CARE_PROVIDER_SITE_OTHER): Payer: BC Managed Care – PPO | Admitting: Physician Assistant

## 2013-07-15 VITALS — BP 110/74 | HR 101 | Temp 98.2°F | Resp 18 | Ht 63.5 in | Wt 272.0 lb

## 2013-07-15 DIAGNOSIS — R0602 Shortness of breath: Secondary | ICD-10-CM

## 2013-07-15 DIAGNOSIS — F430 Acute stress reaction: Secondary | ICD-10-CM

## 2013-07-15 DIAGNOSIS — E119 Type 2 diabetes mellitus without complications: Secondary | ICD-10-CM

## 2013-07-15 NOTE — Patient Instructions (Signed)
Increase the Qvar to 4 puffs twice each day.  Continue the albuterol RESCUE inhaler AS NEEDED.  Schedule an eye exam, and a dental cleaning.

## 2013-07-15 NOTE — Progress Notes (Signed)
  Subjective:    Patient ID: Emily Mathis, female    DOB: [birth year], 47 y.o.   MRN: [id number]  HPI Emily Mathis is a 47 YO female with a history of thyroid disease, HTN, Anxiety, and diabetes presents for a follow-up on SOB/asthma symptoms after continued use of Qvar and re-evaluation of elevated glucose and beginning Metformin.    She states she still has chest pain similar to the pain she experienced when she was seen in the ED for SOB/asthma but less severe that she describes as sharp pain but specifically occurring only with deep inhalation during exertion.  She denies any chest pain during rest. She states her shortness of breath seems to increase when she is stressed.  She is currently taking Qvar and Albuterol as needed which has been typically twice daily.  She states the Qvar has helped her breathing greatly.   She has been taking Metformin daily with no complaints.  She denies any vision changes, leg swelling, or changes in her skin.    Past Medical History  Diagnosis Date  . Hyperlipidemia   . Asthma   . Thyroid disease   . Arthritis   . Hypertension     per Dr. Verl Dicker note 08/30/2011  . Anxiety   . GERD (gastroesophageal reflux disease)   . Headache(784.0)   . Cancer     Pre-Cancer-Ovarian    Review of Systems  Constitutional: Negative for fever, chills and fatigue.  HENT: Negative for sneezing.   Respiratory: Positive for chest tightness and shortness of breath. Negative for cough and wheezing.   Cardiovascular: Positive for chest pain. Negative for palpitations and leg swelling.  Gastrointestinal: Negative for nausea, vomiting, abdominal pain and diarrhea.  Psychiatric/Behavioral: The patient is nervous/anxious.        Objective:   Physical Exam  Emily Mathis  is a pleasant well-developed, well-nourished African American female  in no acute distress Heart: Normal rate and rhythm, no murmurs, rubs, or gallops Lungs: Clear to auscultation bilaterally, no  wheezing, rhonchi, or rales Extremities: No cyanosis or edema noted, peripheral pulses 2+ bilaterally  BP 110/74  Pulse 101  Temp(Src) 98.2 F (36.8 C) (Oral)  Resp 18  Ht 5' 3.5" (1.613 m)  Wt 272 lb (123.378 kg)  BMI 47.42 kg/m2  SpO2 99%  LMP 03/11/2013     Assessment & Plan:  Shortness of breath  Reaction, situational, acute, to stress  Diabetes mellitus type 2, controlled, without complications  Patient Instructions  Increase the Qvar to 4 puffs twice each day.  Continue the albuterol RESCUE inhaler AS NEEDED.  Schedule an eye exam, and a dental cleaning.

## 2013-07-16 NOTE — Progress Notes (Signed)
I have examined this patient along with the student and agree.  Unclear etiology for her SOB, but certainly linked to her stress reaction. Increase Qvar dose, as that appears to have helped, but also continue to work on stress relief.  Consider referral to cardiology or pulmonology if symptoms persist, and she is reminded to call 911 or go the ED if she doesn't get relief from her rescue inhaler.

## 2013-07-23 ENCOUNTER — Other Ambulatory Visit: Payer: Self-pay

## 2013-07-29 ENCOUNTER — Telehealth (INDEPENDENT_AMBULATORY_CARE_PROVIDER_SITE_OTHER): Payer: Self-pay | Admitting: *Deleted

## 2013-07-29 NOTE — Telephone Encounter (Signed)
Patient called to ask about her yearly labs.  She states that she hasn't received her lab slip and asking about it.  Explained to patient that I would send a message to Dr. Gerrit Friends to ask then we would let her know.  Patient states understanding and agreeable at this time.

## 2013-07-30 NOTE — Telephone Encounter (Signed)
Pt notified lab slip at front desk to pick up.

## 2013-08-30 ENCOUNTER — Other Ambulatory Visit: Payer: Self-pay | Admitting: Family Medicine

## 2013-09-02 ENCOUNTER — Telehealth (INDEPENDENT_AMBULATORY_CARE_PROVIDER_SITE_OTHER): Payer: Self-pay

## 2013-09-02 ENCOUNTER — Ambulatory Visit (INDEPENDENT_AMBULATORY_CARE_PROVIDER_SITE_OTHER): Payer: BC Managed Care – PPO | Admitting: Surgery

## 2013-09-02 ENCOUNTER — Encounter (INDEPENDENT_AMBULATORY_CARE_PROVIDER_SITE_OTHER): Payer: Self-pay | Admitting: Surgery

## 2013-09-02 VITALS — BP 140/84 | HR 96 | Temp 98.6°F | Resp 15 | Ht 63.0 in | Wt 277.4 lb

## 2013-09-02 DIAGNOSIS — L910 Hypertrophic scar: Secondary | ICD-10-CM

## 2013-09-02 DIAGNOSIS — E890 Postprocedural hypothyroidism: Secondary | ICD-10-CM

## 2013-09-02 MED ORDER — TRIAMCINOLONE ACETONIDE 0.1 % EX CREA: 1.0000 "application " | TOPICAL_CREAM | Freq: Two times a day (BID) | CUTANEOUS | Status: DC

## 2013-09-02 NOTE — Progress Notes (Signed)
General Surgery [facility]  Chief Complaint  Patient presents with  . Follow-up    total thyroidectomy April 2014 - check voice quality, adjust thyroid hormone    HISTORY: The patient is a 47 year old female who underwent total thyroidectomy in April 2014 for benign disease. Postoperatively she has had significant hoarseness. This continues to improve. She has also had difficulty with achieving an adequate level of thyroid hormone replacement. She is currently on levothyroxine 175 mcg daily. Her most recent TSH level remains elevated at 20.9.  Of note, the patient was recently diagnosed with type 2 diabetes by her primary care provider.  PERTINENT REVIEW OF SYSTEMS: Moderate fatigue. Improved voice with slight hoarseness. Tenderness at surgical incision  EXAM: HEENT: normocephalic; pupils equal and reactive; sclerae clear; dentition good; mucous membranes moist NECK:  Well-healed cervical incision with mild to moderate keloid formation, mild tenderness to palpation along the right side of the incision; no palpable masses in the thyroid bed; symmetric on extension; no palpable anterior or posterior cervical lymphadenopathy; no supraclavicular masses; no tenderness; voice quality is essentially normal at conversational level CHEST: clear to auscultation bilaterally without rales, rhonchi, or wheezes CARDIAC: regular rate and rhythm without significant murmur; peripheral pulses are full EXT:  non-tender without edema; no deformity NEURO: no gross focal deficits; no sign of tremor   IMPRESSION: #1 postsurgical hypothyroidism #2 post surgical alteration in voice quality #3 keloid formation at surgical wound #4 newly diagnosed type 2 diabetes  PLAN: I am going to prescribe topical triamcinolone cream for use of her surgical scar in order to reduce the keloid formation and provide for relief of pain and itching.  I am going to ask the patient to return to Dr. Ardyth Harps for evaluation both for her thyroid hormone replacement dosage and her daily diagnosed diabetes.  Patient will return for surgical follow-up in 6 months.  Velora Heckler, MD, [facility] Office: [phone]  Visit Diagnoses: 1. Hypothyroidism, postsurgical   2. Keloid scar, cervical incision

## 2013-09-02 NOTE — Patient Instructions (Signed)

## 2013-09-02 NOTE — Telephone Encounter (Signed)
Per Dr Ardine Eng request records faxed to Dr Evlyn Kanner. I called Dr Rinaldo Cloud office and was advised the pt needs to call his office to make appt.. I have notified the pt to call Dr Rinaldo Cloud office asap to set up appt for f/u thyroid and DM. Pt states she understands and will call for appt.

## 2013-09-14 ENCOUNTER — Ambulatory Visit (INDEPENDENT_AMBULATORY_CARE_PROVIDER_SITE_OTHER): Payer: BC Managed Care – PPO | Admitting: Physician Assistant

## 2013-09-14 VITALS — BP 128/82 | HR 85 | Temp 98.0°F | Resp 16 | Ht 63.0 in | Wt 281.2 lb

## 2013-09-14 DIAGNOSIS — E119 Type 2 diabetes mellitus without complications: Secondary | ICD-10-CM

## 2013-09-14 DIAGNOSIS — E669 Obesity, unspecified: Secondary | ICD-10-CM

## 2013-09-14 DIAGNOSIS — J45909 Unspecified asthma, uncomplicated: Secondary | ICD-10-CM

## 2013-09-14 DIAGNOSIS — Z23 Encounter for immunization: Secondary | ICD-10-CM

## 2013-09-14 LAB — PULMONARY FUNCTION TEST

## 2013-09-14 LAB — GLUCOSE, POCT (MANUAL RESULT ENTRY): POC Glucose: 91 mg/dl (ref 70–99)

## 2013-09-14 NOTE — Progress Notes (Signed)
   Subjective:    Patient ID: Emily Mathis, female    DOB: Jun 15, 1966, 47 y.o.   MRN: [id number]  Chief Complaint  Patient presents with  . Follow-up    asthma   Medications, allergies, past medical history, surgical history, family history, social history and problem list reviewed and updated.  HPI Presents for re-evaluation of asthma after increasing Qvar 80 mcg from 2 puffs BID to 4 puffs BID. "When I'm not over active, I'm good."  Uses rescue when she needed it, about 2 x/week.    Continues to tolerate metformin, for new diagnosis of diabetes. At her last visit with Dr. Gerrit Friends, he referred her back to Dr. Evlyn Kanner for further management of hypothyroidism s/p thyroidectomy and also due to this new diagnosis.  Her appointment is 10/01/2013 at 2 pm.  Weight has fluctuated by about 10 pounds over the past year.  Tries to walk, "When I can."  But that's not much.    Stress level is improved, "but I have my days, sometimes."  She has not yet scheduled with eye or dental specialists.  Review of Systems Denies chest pain, HA, dizziness, vision change, nausea, vomiting, diarrhea, constipation, melena, hematochezia, dysuria, increased urinary urgency or frequency, increased hunger or thirst, unintentional weight change, unexplained myalgias or arthralgias, rash.     Objective:   Physical Exam Blood pressure 128/82, pulse 85, temperature 98 F (36.7 C), temperature source Oral, resp. rate 16, height 5\' 3"  (1.6 m), weight 281 lb 3.2 oz (127.551 kg), last menstrual period 03/11/2013, SpO2 100.00%. Body mass index is 49.82 kg/(m^2). Well-developed, well nourished BF who is awake, alert and oriented, in NAD. HEENT: [place]/AT, PERRL, EOMI.  Sclera and conjunctiva are clear.  EAC are patent, TMs are normal in appearance. Nasal mucosa is pink and moist. OP is clear. Neck: supple, non-tender, no lymphadenopathy, thyromegaly. Heart: RRR, no murmur Lungs: normal effort, CTA Extremities: no cyanosis,  clubbing or edema. Skin: warm and dry without rash. Psychologic: good mood and appropriate affect, normal speech and behavior.  Diabetic foot exam is normal.  Results for orders placed in visit on 09/14/13  GLUCOSE, POCT (MANUAL RESULT ENTRY)      Result Value Range   POC Glucose 91  70 - 99 mg/dl   SPIROMETRY: best (% predicted) FVC 2.01L (70%) FEV 1.63L (70%) FEV1/FVC 98% FEF25-75 1.80L/s (71%)     Assessment & Plan:  1. Diabetes mellitus type 2, uncomplicated, controlled. Continue metformin.  Follow-up with Dr. Evlyn Kanner as scheduled. - Microalbumin, urine - HM Diabetes Eye Exam - HM Diabetes Foot Exam - POCT glucose (manual entry)  2. Obesity Healthy lifestyle changes encouraged.  3. Unspecified asthma(493.90) Continue current treatment.  Hopefully as she loses weight and becomes more active, we'll see improvement and can reduce the need for inhaled steroid. Re-evaluate in 4 months, sooner if needed.  4. Need for Tdap vaccination - Tdap vaccine greater than or equal to 7yo IM   Fernande Bras, PA-C Physician Assistant-Certified [facility]

## 2013-09-14 NOTE — Patient Instructions (Signed)
Continue with the inhalers and other medications as before. Increase your physical activity-your goal is 150 minutes of exercise each week. Start making healthier eating choices.  It may take more planning, but it's important. Please schedule an appointment with an eye specialist and a dentist as soon as you can.

## 2013-09-15 LAB — MICROALBUMIN, URINE: Microalb, Ur: 1.39 mg/dL (ref 0.00–1.89)

## 2013-10-09 ENCOUNTER — Encounter: Payer: Self-pay | Admitting: Physician Assistant

## 2013-11-23 ENCOUNTER — Other Ambulatory Visit: Payer: Self-pay | Admitting: Physician Assistant

## 2013-12-10 ENCOUNTER — Other Ambulatory Visit: Payer: Self-pay | Admitting: Physician Assistant

## 2013-12-10 DIAGNOSIS — F430 Acute stress reaction: Secondary | ICD-10-CM

## 2013-12-11 MED ORDER — ALPRAZOLAM 0.5 MG PO TABS: 0.5000 mg | ORAL_TABLET | Freq: Every evening | ORAL | Status: DC | PRN

## 2013-12-11 NOTE — Telephone Encounter (Signed)
Called in and notified pt on MyChart. 

## 2013-12-11 NOTE — Telephone Encounter (Signed)
Please phone in: Meds ordered this encounter  Medications  . ALPRAZolam (XANAX) 0.5 MG tablet    Sig: Take 1 tablet (0.5 mg total) by mouth at bedtime as needed for sleep.    Dispense:  30 tablet    Refill:  0    Order Specific Question:  Supervising Provider    Answer:  DOOLITTLE, ROBERT P [[id number]]

## 2014-02-25 ENCOUNTER — Emergency Department (HOSPITAL_COMMUNITY): Payer: BC Managed Care – PPO

## 2014-02-25 ENCOUNTER — Emergency Department (HOSPITAL_COMMUNITY)
Admission: EM | Admit: 2014-02-25 | Discharge: 2014-02-26 | Disposition: A | Discharge status: Home / Self Care | Payer: BC Managed Care – PPO | Attending: Emergency Medicine | Admitting: Emergency Medicine

## 2014-02-25 DIAGNOSIS — K219 Gastro-esophageal reflux disease without esophagitis: Secondary | ICD-10-CM | POA: Insufficient documentation

## 2014-02-25 DIAGNOSIS — R0602 Shortness of breath: Secondary | ICD-10-CM

## 2014-02-25 DIAGNOSIS — F419 Anxiety disorder, unspecified: Secondary | ICD-10-CM

## 2014-02-25 DIAGNOSIS — Z9889 Other specified postprocedural states: Secondary | ICD-10-CM | POA: Insufficient documentation

## 2014-02-25 DIAGNOSIS — J45901 Unspecified asthma with (acute) exacerbation: Secondary | ICD-10-CM | POA: Insufficient documentation

## 2014-02-25 DIAGNOSIS — Z8543 Personal history of malignant neoplasm of ovary: Secondary | ICD-10-CM | POA: Insufficient documentation

## 2014-02-25 DIAGNOSIS — Z8739 Personal history of other diseases of the musculoskeletal system and connective tissue: Secondary | ICD-10-CM | POA: Insufficient documentation

## 2014-02-25 DIAGNOSIS — Z91040 Latex allergy status: Secondary | ICD-10-CM | POA: Insufficient documentation

## 2014-02-25 DIAGNOSIS — I10 Essential (primary) hypertension: Secondary | ICD-10-CM | POA: Insufficient documentation

## 2014-02-25 DIAGNOSIS — F411 Generalized anxiety disorder: Secondary | ICD-10-CM | POA: Insufficient documentation

## 2014-02-25 DIAGNOSIS — R0789 Other chest pain: Secondary | ICD-10-CM | POA: Insufficient documentation

## 2014-02-25 DIAGNOSIS — R079 Chest pain, unspecified: Secondary | ICD-10-CM

## 2014-02-25 MED ORDER — ACETAMINOPHEN 325 MG PO TABS
650.0000 mg | ORAL_TABLET | Freq: Once | ORAL | Status: AC
  Administered 2014-02-25: 650 mg via ORAL
  Filled 2014-02-25: qty 2

## 2014-02-25 NOTE — ED Notes (Signed)
Patient transported to X-ray 

## 2014-02-25 NOTE — ED Provider Notes (Signed)
CSN: [id number]     Arrival date & time 02/25/14  1949 History   First MD Initiated Contact with Patient 02/25/14 2218     Chief Complaint  Patient presents with  . Shortness of Breath  . Asthma     (Consider location/radiation/quality/duration/timing/severity/associated sxs/prior Treatment) Patient is a 48 y.o. female presenting with shortness of breath. The history is provided by the patient and medical records.  Shortness of Breath Severity:  Severe Onset quality:  Sudden Timing:  Constant Progression:  Resolved Chronicity:  Recurrent Relieved by:  Rest, inhaler and oxygen Worsened by:  Nothing tried Associated symptoms: chest pain   Associated symptoms: no abdominal pain, no cough, no fever, no headaches, no rash and no vomiting     48 yo F pw cp and sob.  Cp since last night. Intermittent. Improved with relaxation. But not brought on or particularly worsened with exertion. Has had intermittent right chest pain in past with her anxiety and asthma. Felt like this was probably the same last night, but pain to left chest. Tightness. No radiation. No n/v.  This AM feeling well. Daughter reportedly had syncopal event witnessed by mother. Developed this chest pain again to left side. Got very sob. This resolved after breathing treatment with EMS.  Now without any cp or sob.  Reports h/o cardiac cath about 2 years ago that "did not have a blockage".  Chronic BLE edema. Unchanged. No h/o DVT/PE.    Past Medical History  Diagnosis Date  . Hyperlipidemia   . Asthma   . Thyroid disease   . Arthritis   . Hypertension     per Dr. Irven Shelling note 08/30/2011  . Anxiety   . GERD (gastroesophageal reflux disease)   . Headache(784.0)   . Cancer     Pre-Cancer-Ovarian   Past Surgical History  Procedure Laterality Date  . Cesarean section  1995  . Cesarean section  1996  . Cardiac catheterization  2013  . Thyroidectomy N/A 12/18/2012    Procedure: TOTAL THYROIDECTOMY;  Surgeon: Earnstine Regal, MD;  Location: [facility];  Service: General;  Laterality: N/A;   Family History  Problem Relation Age of Onset  . Heart disease Mother   . Stroke Mother   . Hypertension Mother   . Heart disease Father   . Colon cancer Father   . Hypertension Father   . Breast cancer Paternal Aunt   . Cancer Paternal Grandmother   . Cancer Paternal Aunt   . Asthma Daughter   . Seizures Daughter   . Hypertension Sister   . Mental illness Daughter 18    suicide attempt 06/2013   History  Substance Use Topics  . Smoking status: Never Smoker   . Smokeless tobacco: Never Used  . Alcohol Use: Yes     Comment: Occasional   OB History   Grav Para Term Preterm Abortions TAB SAB Ect Mult Living                 Review of Systems  Constitutional: Negative for fever and chills.  HENT: Negative for congestion and rhinorrhea.   Eyes: Negative for pain and visual disturbance.  Respiratory: Positive for shortness of breath. Negative for cough.   Cardiovascular: Positive for chest pain and leg swelling (chronic. no change).  Gastrointestinal: Negative for vomiting, abdominal pain and diarrhea.  Genitourinary: Negative for dysuria, hematuria, flank pain and difficulty urinating.  Skin: Negative for color change and rash.  Neurological: Negative for dizziness and headaches.  Psychiatric/Behavioral: The patient is nervous/anxious.   All other systems reviewed and are negative.     Allergies  Latex; Dilaudid; and Hydrocodone  Home Medications   Prior to Admission medications   Medication Sig Start Date End Date Taking? Authorizing Provider  albuterol (PROVENTIL HFA;VENTOLIN HFA) 108 (90 BASE) MCG/ACT inhaler Inhale 2 puffs into the lungs daily as needed. For shortness of breath   Yes Historical Provider, MD  ALPRAZolam (XANAX) 0.5 MG tablet Take 1 tablet (0.5 mg total) by mouth at bedtime as needed for sleep.   Yes [name] S Jeffery, PA-C  beclomethasone (QVAR) 80 MCG/ACT inhaler Inhale 2 puffs  into the lungs 2 (two) times daily. 06/18/13  Yes [name] S Jeffery, PA-C  calcitRIOL (ROCALTROL) 0.25 MCG capsule Take 1 capsule (0.25 mcg total) by mouth 2 (two) times daily. 12/23/12  Yes Earnstine Regal, MD  chlorthalidone (HYGROTON) 25 MG tablet Take 25 mg by mouth daily.   Yes Historical Provider, MD  levothyroxine (SYNTHROID) 175 MCG tablet Take 1 tablet (175 mcg total) by mouth daily before breakfast. 06/15/13  Yes Earnstine Regal, MD  meloxicam (MOBIC) 15 MG tablet Take 15 mg by mouth daily.   Yes Historical Provider, MD  metFORMIN (GLUCOPHAGE) 500 MG tablet Take 1 tablet (500 mg total) by mouth daily with breakfast. 07/02/13  Yes [name] S Jeffery, PA-C  omeprazole (PRILOSEC) 20 MG capsule Take 20 mg by mouth daily. Take 2 daily-patient been taking one when she needs it only.   Yes Historical Provider, MD  triamcinolone cream (KENALOG) 0.1 % Apply 1 application topically 2 (two) times daily. 09/02/13  Yes Earnstine Regal, MD   BP 112/58  Pulse 85  Temp(Src) 98.3 F (36.8 C) (Oral)  Resp 19  SpO2 97%  LMP 03/11/2013 Physical Exam  Nursing note and vitals reviewed. Constitutional: She is oriented to person, place, and time. She appears well-developed and well-nourished. No distress.  Sitting up in bed. NAD. Speaks in full sentences. Smiling.  HENT:  Head: Normocephalic and atraumatic.  Eyes: Conjunctivae are normal. Right eye exhibits no discharge. Left eye exhibits no discharge.  Neck: No tracheal deviation present.  Cardiovascular: Normal rate, regular rhythm, normal heart sounds and intact distal pulses.   Pulmonary/Chest: Effort normal and breath sounds normal. No stridor. No respiratory distress. She has no wheezes. She has no rales.  Abdominal: Soft. She exhibits no distension. There is no tenderness. There is no guarding.  Musculoskeletal: She exhibits edema (mild BLE. equal. legs NTTP). She exhibits no tenderness.  Neurological: She is alert and oriented to person, place, and time.   Skin: Skin is warm and dry.  Psychiatric: She has a normal mood and affect. Her behavior is normal.    ED Course  Procedures (including critical care time) Labs Review Labs Reviewed - No data to display  Imaging Review Dg Chest 2 View  02/25/2014   CLINICAL DATA:  Shortness of breath, asthma  EXAM: CHEST  2 VIEW  COMPARISON:  Prior radiograph from 06/14/2013  FINDINGS: The cardiac and mediastinal silhouettes are stable in size and contour, and remain within normal limits.  The lungs are normally inflated. Diffuse peribronchial thickening is present, likely related to provided history of asthma. No airspace consolidation, pleural effusion, or pulmonary edema is identified. There is no pneumothorax.  No acute osseous abnormality identified.  IMPRESSION: Diffuse peribronchial thickening, compatible with provided history of asthma. No consolidative airspace disease identified.   Electronically Signed   By: Pincus Badder.D.  On: 02/25/2014 23:28     EKG Interpretation   Date/Time:  Thursday February 25 2014 19:54:49 EDT Ventricular Rate:  103 PR Interval:  165 QRS Duration: 79 QT Interval:  339 QTC Calculation: 444 R Axis:   72 Text Interpretation:  Sinus tachycardia Nonspecific repol abnormality,  diffuse leads Confirmed by Christy Gentles  MD, DONALD ([id number]) on 02/25/2014  8:07:28 PM      MDM   Final diagnoses:  SOB (shortness of breath)  Anxiety  Chest pain    Chest pain and SOB. Similar to prior. Episodes only occur when she starts feeling anxious. Today's episode with child's syncope. Symptoms resolved prior to my evaluation. Lungs clear throughout.  Without findings to suggest pna. No fevers or cough. cxr clear. No evidence of ptx.  Doubt ACS. Recent negative cath per patient and on review of records. EKG largely similar to prior.  Doubt PE. No pleuritic pain. No h/o DVT/PE. Low risk.  Do not believe further labs or imaging warranted at this time.  Likely related to  anxiety.   Patient discharged home. Return precautions given. To follow up with pcp. patient in agreement with plan.  Labs and imaging reviewed by myself and considered in medical decision making if ordered. Imaging interpreted by radiology.   Discussed case with Dr. Christy Gentles who is in agreement with assessment and plan.        Bonnita Hollow, MD 02/26/14 [phone]

## 2014-02-25 NOTE — Discharge Instructions (Signed)
Chest Pain (Nonspecific) °It is often hard to give a specific diagnosis for the cause of chest pain. There is always a chance that your pain could be related to something serious, such as a heart attack or a blood clot in the lungs. You need to follow up with your caregiver for further evaluation. °CAUSES  °· Heartburn. °· Pneumonia or bronchitis. °· Anxiety or stress. °· Inflammation around your heart (pericarditis) or lung (pleuritis or pleurisy). °· A blood clot in the lung. °· A collapsed lung (pneumothorax). It can develop suddenly on its own (spontaneous pneumothorax) or from injury (trauma) to the chest. °· Shingles infection (herpes zoster virus). °The chest wall is composed of bones, muscles, and cartilage. Any of these can be the source of the pain. °· The bones can be bruised by injury. °· The muscles or cartilage can be strained by coughing or overwork. °· The cartilage can be affected by inflammation and become sore (costochondritis). °DIAGNOSIS  °Lab tests or other studies, such as X-rays, electrocardiography, stress testing, or cardiac imaging, may be needed to find the cause of your pain.  °TREATMENT  °· Treatment depends on what may be causing your chest pain. Treatment may include: °· Acid blockers for heartburn. °· Anti-inflammatory medicine. °· Pain medicine for inflammatory conditions. °· Antibiotics if an infection is present. °· You may be advised to change lifestyle habits. This includes stopping smoking and avoiding alcohol, caffeine, and chocolate. °· You may be advised to keep your head raised (elevated) when sleeping. This reduces the chance of acid going backward from your stomach into your esophagus. °· Most of the time, nonspecific chest pain will improve within 2 to 3 days with rest and mild pain medicine. °HOME CARE INSTRUCTIONS  °· If antibiotics were prescribed, take your antibiotics as directed. Finish them even if you start to feel better. °· For the next few days, avoid physical  activities that bring on chest pain. Continue physical activities as directed. °· Do not smoke. °· Avoid drinking alcohol. °· Only take over-the-counter or prescription medicine for pain, discomfort, or fever as directed by your caregiver. °· Follow your caregiver's suggestions for further testing if your chest pain does not go away. °· Keep any follow-up appointments you made. If you do not go to an appointment, you could develop lasting (chronic) problems with pain. If there is any problem keeping an appointment, you must call to reschedule. °SEEK MEDICAL CARE IF:  °· You think you are having problems from the medicine you are taking. Read your medicine instructions carefully. °· Your chest pain does not go away, even after treatment. °· You develop a rash with blisters on your chest. °SEEK IMMEDIATE MEDICAL CARE IF:  °· You have increased chest pain or pain that spreads to your arm, neck, jaw, back, or abdomen. °· You develop shortness of breath, an increasing cough, or you are coughing up blood. °· You have severe back or abdominal pain, feel nauseous, or vomit. °· You develop severe weakness, fainting, or chills. °· You have a fever. °THIS IS AN EMERGENCY. Do not wait to see if the pain will go away. Get medical help at once. Call your local emergency services (911 in [place] Do not drive yourself to the hospital. °MAKE SURE YOU:  °· Understand these instructions. °· Will watch your condition. °· Will get help right away if you are not doing well or get worse. °Document Released: 06/13/2005 Document Revised: 11/26/2011 Document Reviewed: 04/08/2008 °[employer]® Patient Information ©2014 [employer]   [employer].  Shortness of Breath Shortness of breath means you have trouble breathing. Shortness of breath may indicate that you have a medical problem. You should seek immediate medical care for shortness of breath. CAUSES   Not enough oxygen in the air (as with high altitudes or a smoke-filled room).  Short-term  (acute) lung disease, including:  Infections, such as pneumonia.  Fluid in the lungs, such as heart failure.  A blood clot in the lungs (pulmonary embolism).  Long-term (chronic) lung diseases.  Heart disease (heart attack, angina, heart failure, and others).  Low red blood cells (anemia).  Poor physical fitness. This can cause shortness of breath when you exercise.  Chest or back injuries or stiffness.  Being overweight.  Smoking.  Anxiety. This can make you feel like you are not getting enough air. DIAGNOSIS  Serious medical problems can usually be found during your physical exam. Tests may also be done to determine why you are having shortness of breath. Tests may include:  Chest X-rays.  Lung function tests.  Blood tests.  Electrocardiography.  Exercise testing.  Echocardiography.  Imaging scans. Your caregiver may not be able to find a cause for your shortness of breath after your exam. In this case, it is important to have a follow-up exam with your caregiver as directed.  TREATMENT  Treatment for shortness of breath depends on the cause of your symptoms and can vary greatly. HOME CARE INSTRUCTIONS   Do not smoke. Smoking is a common cause of shortness of breath. If you smoke, ask for help to quit.  Avoid being around chemicals or things that may bother your breathing, such as paint fumes and dust.  Rest as needed. Slowly resume your usual activities.  If medicines were prescribed, take them as directed for the full length of time directed. This includes oxygen and any inhaled medicines.  Keep all follow-up appointments as directed by your caregiver. SEEK MEDICAL CARE IF:   Your condition does not improve in the time expected.  You have a hard time doing your normal activities even with rest.  You have any side effects or problems with the medicines prescribed.  You develop any new symptoms. SEEK IMMEDIATE MEDICAL CARE IF:   Your shortness of  breath gets worse.  You feel lightheaded, faint, or develop a cough not controlled with medicines.  You start coughing up blood.  You have pain with breathing.  You have chest pain or pain in your arms, shoulders, or abdomen.  You have a fever.  You are unable to walk up stairs or exercise the way you normally do. MAKE SURE YOU:  Understand these instructions.  Will watch your condition.  Will get help right away if you are not doing well or get worse. Document Released: 05/29/2001 Document Revised: 03/04/2012 Document Reviewed: 11/19/2011 [facility] Patient Information 2014 [place].  Panic Attacks Panic attacks are sudden, short feelings of great fear or discomfort. You may have them for no reason when you are relaxed, when you are uneasy (anxious), or when you are sleeping.  HOME CARE  Take all your medicines as told.  Check with your doctor before starting new medicines.  Keep all doctor visits. GET HELP IF:  You are not able to take your medicines as told.  Your symptoms do not get better.  Your symptoms get worse. GET HELP RIGHT AWAY IF:  Your attacks seem different than your normal attacks.  You have thoughts about hurting yourself or others.  You take  panic attack medicine and you have a side effect. MAKE SURE YOU:  Understand these instructions.  Will watch your condition.  Will get help right away if you are not doing well or get worse. Document Released: 10/06/2010 Document Revised: 06/24/2013 Document Reviewed: 04/17/2013 [facility] Patient Information 2014 [place], [place].

## 2014-02-25 NOTE — ED Notes (Signed)
[place] [facility] [place] and had a syncopal episode. Patient became excited and became short of breath and started to hyperventilate per EMS. Upon arrival to ED patient was tripoding, hyperventilating and was panicked. Patient has a oxygen saturation of 100% on RA. Patient has a raspy voice but is maintaining her airway without any difficulty.

## 2014-02-26 NOTE — ED Notes (Signed)
Tylenol given at d/c for HA. Alert, NAD, calm, interactive, resps e/u, speaking in clear complete sentences, "feel better, ready to go", given work note, pt with minor child out to d/c desk, then to [facility] ED to see her daughter (also a pt), steady gait, no dyspnea noted.

## 2014-02-26 NOTE — ED Provider Notes (Signed)
I have personally seen and examined the patient.  I have discussed the plan of care with the resident.  I have reviewed the documentation on PMH/FH/Soc. History.  I have reviewed the documentation of the resident and agree.  Pt reported improved on my evaluation Low suspicion for ACS/PE.  She reports most of her symptoms triggered by anxiety about her child's syncope Stable for d/c home  Sharyon Cable, MD 02/26/14 [id number]

## 2014-03-10 ENCOUNTER — Encounter (HOSPITAL_COMMUNITY): Payer: Self-pay | Admitting: Emergency Medicine

## 2014-03-10 ENCOUNTER — Ambulatory Visit (INDEPENDENT_AMBULATORY_CARE_PROVIDER_SITE_OTHER): Payer: BC Managed Care – PPO | Admitting: Family Medicine

## 2014-03-10 ENCOUNTER — Ambulatory Visit (INDEPENDENT_AMBULATORY_CARE_PROVIDER_SITE_OTHER): Payer: BC Managed Care – PPO

## 2014-03-10 ENCOUNTER — Encounter (INDEPENDENT_AMBULATORY_CARE_PROVIDER_SITE_OTHER): Payer: Self-pay | Admitting: Surgery

## 2014-03-10 ENCOUNTER — Emergency Department (HOSPITAL_COMMUNITY): Payer: BC Managed Care – PPO

## 2014-03-10 ENCOUNTER — Emergency Department (HOSPITAL_COMMUNITY)
Admission: EM | Admit: 2014-03-10 | Discharge: 2014-03-10 | Disposition: A | Discharge status: Home / Self Care | Payer: BC Managed Care – PPO | Attending: Emergency Medicine | Admitting: Emergency Medicine

## 2014-03-10 VITALS — BP 158/104 | HR 108 | Temp 97.7°F | Resp 22 | Ht 63.0 in | Wt 276.3 lb

## 2014-03-10 DIAGNOSIS — R109 Unspecified abdominal pain: Secondary | ICD-10-CM

## 2014-03-10 DIAGNOSIS — F411 Generalized anxiety disorder: Secondary | ICD-10-CM | POA: Insufficient documentation

## 2014-03-10 DIAGNOSIS — E079 Disorder of thyroid, unspecified: Secondary | ICD-10-CM | POA: Insufficient documentation

## 2014-03-10 DIAGNOSIS — Z9889 Other specified postprocedural states: Secondary | ICD-10-CM | POA: Insufficient documentation

## 2014-03-10 DIAGNOSIS — M546 Pain in thoracic spine: Secondary | ICD-10-CM

## 2014-03-10 DIAGNOSIS — E119 Type 2 diabetes mellitus without complications: Secondary | ICD-10-CM | POA: Insufficient documentation

## 2014-03-10 DIAGNOSIS — R1013 Epigastric pain: Secondary | ICD-10-CM | POA: Insufficient documentation

## 2014-03-10 DIAGNOSIS — R1011 Right upper quadrant pain: Secondary | ICD-10-CM | POA: Insufficient documentation

## 2014-03-10 DIAGNOSIS — J45909 Unspecified asthma, uncomplicated: Secondary | ICD-10-CM | POA: Insufficient documentation

## 2014-03-10 DIAGNOSIS — M129 Arthropathy, unspecified: Secondary | ICD-10-CM | POA: Insufficient documentation

## 2014-03-10 DIAGNOSIS — Z79899 Other long term (current) drug therapy: Secondary | ICD-10-CM | POA: Insufficient documentation

## 2014-03-10 DIAGNOSIS — K219 Gastro-esophageal reflux disease without esophagitis: Secondary | ICD-10-CM | POA: Insufficient documentation

## 2014-03-10 DIAGNOSIS — M549 Dorsalgia, unspecified: Secondary | ICD-10-CM | POA: Insufficient documentation

## 2014-03-10 DIAGNOSIS — IMO0002 Reserved for concepts with insufficient information to code with codable children: Secondary | ICD-10-CM | POA: Insufficient documentation

## 2014-03-10 DIAGNOSIS — R112 Nausea with vomiting, unspecified: Secondary | ICD-10-CM | POA: Insufficient documentation

## 2014-03-10 DIAGNOSIS — R0602 Shortness of breath: Secondary | ICD-10-CM

## 2014-03-10 DIAGNOSIS — Z791 Long term (current) use of non-steroidal anti-inflammatories (NSAID): Secondary | ICD-10-CM | POA: Insufficient documentation

## 2014-03-10 DIAGNOSIS — Z8543 Personal history of malignant neoplasm of ovary: Secondary | ICD-10-CM | POA: Insufficient documentation

## 2014-03-10 DIAGNOSIS — Z91040 Latex allergy status: Secondary | ICD-10-CM | POA: Insufficient documentation

## 2014-03-10 DIAGNOSIS — I10 Essential (primary) hypertension: Secondary | ICD-10-CM | POA: Insufficient documentation

## 2014-03-10 DIAGNOSIS — E669 Obesity, unspecified: Secondary | ICD-10-CM | POA: Insufficient documentation

## 2014-03-10 DIAGNOSIS — Z3202 Encounter for pregnancy test, result negative: Secondary | ICD-10-CM | POA: Insufficient documentation

## 2014-03-10 HISTORY — DX: Type 2 diabetes mellitus without complications: E11.9

## 2014-03-10 LAB — URINALYSIS, ROUTINE W REFLEX MICROSCOPIC
Bilirubin Urine: NEGATIVE
Glucose, UA: NEGATIVE mg/dL
Ketones, ur: NEGATIVE mg/dL
LEUKOCYTES UA: NEGATIVE
NITRITE: NEGATIVE
PROTEIN: NEGATIVE mg/dL
Specific Gravity, Urine: 1.024 (ref 1.005–1.030)
UROBILINOGEN UA: 0.2 mg/dL (ref 0.0–1.0)
pH: 7 (ref 5.0–8.0)

## 2014-03-10 LAB — CBC WITH DIFFERENTIAL/PLATELET
Basophils Absolute: 0 10*3/uL (ref 0.0–0.1)
Basophils Relative: 0 % (ref 0–1)
EOS PCT: 1 % (ref 0–5)
Eosinophils Absolute: 0.1 10*3/uL (ref 0.0–0.7)
HEMATOCRIT: 36.3 % (ref 36.0–46.0)
Hemoglobin: 11.7 g/dL — ABNORMAL LOW (ref 12.0–15.0)
Lymphocytes Relative: 27 % (ref 12–46)
Lymphs Abs: 3.6 10*3/uL (ref 0.7–4.0)
MCH: 30 pg (ref 26.0–34.0)
MCHC: 32.2 g/dL (ref 30.0–36.0)
MCV: 93.1 fL (ref 78.0–100.0)
MONO ABS: 0.8 10*3/uL (ref 0.1–1.0)
MONOS PCT: 6 % (ref 3–12)
NEUTROS ABS: 8.8 10*3/uL — AB (ref 1.7–7.7)
Neutrophils Relative %: 66 % (ref 43–77)
Platelets: 463 10*3/uL — ABNORMAL HIGH (ref 150–400)
RBC: 3.9 MIL/uL (ref 3.87–5.11)
RDW: 15.8 % — ABNORMAL HIGH (ref 11.5–15.5)
WBC: 13.3 10*3/uL — ABNORMAL HIGH (ref 4.0–10.5)

## 2014-03-10 LAB — POCT UA - MICROSCOPIC ONLY
CASTS, UR, LPF, POC: NEGATIVE
CRYSTALS, UR, HPF, POC: NEGATIVE
Yeast, UA: NEGATIVE

## 2014-03-10 LAB — COMPREHENSIVE METABOLIC PANEL
ALT: 16 U/L (ref 0–35)
AST: 17 U/L (ref 0–37)
Albumin: 3.7 g/dL (ref 3.5–5.2)
Alkaline Phosphatase: 83 U/L (ref 39–117)
BILIRUBIN TOTAL: 0.5 mg/dL (ref 0.3–1.2)
BUN: 14 mg/dL (ref 6–23)
CHLORIDE: 97 meq/L (ref 96–112)
CO2: 28 meq/L (ref 19–32)
Calcium: 8.8 mg/dL (ref 8.4–10.5)
Creatinine, Ser: 0.93 mg/dL (ref 0.50–1.10)
GFR, EST AFRICAN AMERICAN: 84 mL/min — AB (ref >90)
GFR, EST NON AFRICAN AMERICAN: 72 mL/min — AB (ref >90)
Glucose, Bld: 109 mg/dL — ABNORMAL HIGH (ref 70–99)
Potassium: 3.1 mEq/L — ABNORMAL LOW (ref 3.7–5.3)
Sodium: 140 mEq/L (ref 137–147)
Total Protein: 8.3 g/dL (ref 6.0–8.3)

## 2014-03-10 LAB — POCT URINALYSIS DIPSTICK
Bilirubin, UA: NEGATIVE
Glucose, UA: NEGATIVE
KETONES UA: NEGATIVE
Leukocytes, UA: NEGATIVE
Nitrite, UA: NEGATIVE
PROTEIN UA: 30
SPEC GRAV UA: 1.02
Urobilinogen, UA: 0.2
pH, UA: 6

## 2014-03-10 LAB — LIPASE, BLOOD: Lipase: 34 U/L (ref 11–59)

## 2014-03-10 LAB — URINE MICROSCOPIC-ADD ON

## 2014-03-10 LAB — POCT URINE PREGNANCY: PREG TEST UR: NEGATIVE

## 2014-03-10 LAB — POC URINE PREG, ED: Preg Test, Ur: NEGATIVE

## 2014-03-10 MED ORDER — FENTANYL CITRATE 0.05 MG/ML IJ SOLN
100.0000 ug | Freq: Once | INTRAMUSCULAR | Status: AC
  Administered 2014-03-10: 100 ug via INTRAVENOUS
  Filled 2014-03-10: qty 2

## 2014-03-10 MED ORDER — PANTOPRAZOLE SODIUM 40 MG PO TBEC: 40.0000 mg | DELAYED_RELEASE_TABLET | Freq: Every day | ORAL | Status: DC

## 2014-03-10 MED ORDER — HYDROCODONE-ACETAMINOPHEN 5-325 MG PO TABS
1.0000 | ORAL_TABLET | ORAL | Status: DC | PRN
Start: 2014-03-10 — End: 2014-05-11

## 2014-03-10 MED ORDER — ONDANSETRON 8 MG PO TBDP: 8.0000 mg | ORAL_TABLET | Freq: Three times a day (TID) | ORAL | Status: DC | PRN

## 2014-03-10 NOTE — ED Provider Notes (Signed)
CSN: [id number]     Arrival date & time 03/10/14  1506 History   First MD Initiated Contact with Patient 03/10/14 1514     Chief Complaint  Patient presents with  . Abdominal Pain     (Consider location/radiation/quality/duration/timing/severity/associated sxs/prior Treatment) HPI 48 year old female presents with 3 days of right upper abdominal pain. The pain radiates to her back. It has been constant since its onset. Acutely worsened earlier today. Does not know if food has made it better or worse. Feels like a cramping like pain. Earlier was a "10+", now is down to a 4 after 100 mcg fentanyl by EMS. No current nausea, but has had vomiting after drinking fluids, most recently today. No urinary symptoms. No fevers. It hurts worse to take a deep breath. No dyspnea. Pain now feels like it's radiating into her chest. Took some gas-x earlier without relief.  Past Medical History  Diagnosis Date  . Hyperlipidemia   . Asthma   . Thyroid disease   . Arthritis   . Hypertension     per Dr. Irven Shelling note 08/30/2011  . Anxiety   . GERD (gastroesophageal reflux disease)   . Headache(784.0)   . Cancer     Pre-Cancer-Ovarian  . Diabetes mellitus without complication    Past Surgical History  Procedure Laterality Date  . Cesarean section  1995  . Cesarean section  1996  . Cardiac catheterization  2013  . Thyroidectomy N/A 12/18/2012    Procedure: TOTAL THYROIDECTOMY;  Surgeon: Earnstine Regal, MD;  Location: [facility];  Service: General;  Laterality: N/A;   Family History  Problem Relation Age of Onset  . Heart disease Mother   . Stroke Mother   . Hypertension Mother   . Heart disease Father   . Colon cancer Father   . Hypertension Father   . Breast cancer Paternal Aunt   . Cancer Paternal Grandmother   . Cancer Paternal Aunt   . Asthma Daughter   . Seizures Daughter   . Hypertension Sister   . Mental illness Daughter 4    suicide attempt 06/2013   History  Substance Use Topics  .  Smoking status: Never Smoker   . Smokeless tobacco: Never Used  . Alcohol Use: Yes     Comment: Occasional   OB History   Grav Para Term Preterm Abortions TAB SAB Ect Mult Living                 Review of Systems  Constitutional: Negative for fever.  Respiratory: Negative for shortness of breath.   Cardiovascular: Negative for chest pain.  Gastrointestinal: Positive for nausea, vomiting and abdominal pain. Negative for diarrhea.  Genitourinary: Negative for dysuria.  Musculoskeletal: Positive for back pain.  All other systems reviewed and are negative.     Allergies  Latex; Dilaudid; and Hydrocodone  Home Medications   Prior to Admission medications   Medication Sig Start Date End Date Taking? Authorizing Shea Kapur  albuterol (PROVENTIL HFA;VENTOLIN HFA) 108 (90 BASE) MCG/ACT inhaler Inhale 2 puffs into the lungs daily as needed. For shortness of breath    Historical Chimaobi Casebolt, MD  ALPRAZolam (XANAX) 0.5 MG tablet Take 1 tablet (0.5 mg total) by mouth at bedtime as needed for sleep.    [name] S Jeffery, PA-C  beclomethasone (QVAR) 80 MCG/ACT inhaler Inhale 2 puffs into the lungs 2 (two) times daily. 06/18/13   [name] Janalee Dane, PA-C  calcitRIOL (ROCALTROL) 0.25 MCG capsule Take 1 capsule (0.25 mcg total)  by mouth 2 (two) times daily. 12/23/12   Earnstine Regal, MD  chlorthalidone (HYGROTON) 25 MG tablet Take 25 mg by mouth daily.    Historical Satara Virella, MD  levothyroxine (SYNTHROID) 175 MCG tablet Take 1 tablet (175 mcg total) by mouth daily before breakfast. 06/15/13   Earnstine Regal, MD  meloxicam (MOBIC) 15 MG tablet Take 15 mg by mouth daily.    Historical Azzam Mehra, MD  metFORMIN (GLUCOPHAGE) 500 MG tablet Take 1 tablet (500 mg total) by mouth daily with breakfast. 07/02/13   [name] S Jeffery, PA-C  omeprazole (PRILOSEC) 20 MG capsule Take 20 mg by mouth daily. Take 2 daily-patient been taking one when she needs it only.    Historical Kayli Beal, MD  triamcinolone cream (KENALOG) 0.1 %  Apply 1 application topically 2 (two) times daily. 09/02/13   Earnstine Regal, MD   SpO2 98%  LMP 03/11/2013 Physical Exam  Nursing note and vitals reviewed. Constitutional: She is oriented to person, place, and time. She appears well-developed and well-nourished. No distress.  obese  HENT:  Head: Normocephalic and atraumatic.  Right Ear: External ear normal.  Left Ear: External ear normal.  Nose: Nose normal.  Eyes: Right eye exhibits no discharge. Left eye exhibits no discharge.  Cardiovascular: Normal rate, regular rhythm and normal heart sounds.   Pulmonary/Chest: Effort normal and breath sounds normal.  Abdominal: Soft. There is tenderness in the right upper quadrant and epigastric area. There is negative Murphy's sign.  Neurological: She is alert and oriented to person, place, and time.  Skin: Skin is warm and dry.    ED Course  Procedures (including critical care time) Labs Review Labs Reviewed  CBC WITH DIFFERENTIAL - Abnormal; Notable for the following:    WBC 13.3 (*)    Hemoglobin 11.7 (*)    RDW 15.8 (*)    Platelets 463 (*)    Neutro Abs 8.8 (*)    All other components within normal limits  COMPREHENSIVE METABOLIC PANEL - Abnormal; Notable for the following:    Potassium 3.1 (*)    Glucose, Bld 109 (*)    GFR calc non Af Amer 72 (*)    GFR calc Af Amer 84 (*)    All other components within normal limits  URINALYSIS, ROUTINE W REFLEX MICROSCOPIC - Abnormal; Notable for the following:    APPearance CLOUDY (*)    Hgb urine dipstick LARGE (*)    All other components within normal limits  URINE MICROSCOPIC-ADD ON - Abnormal; Notable for the following:    Squamous Epithelial / LPF MANY (*)    Bacteria, UA MANY (*)    All other components within normal limits  LIPASE, BLOOD  POC URINE PREG, ED    Imaging Review US Abdomen Complete  03/10/2014   CLINICAL DATA:  Right upper quadrant pain  EXAM: ULTRASOUND ABDOMEN COMPLETE  COMPARISON:  None.  FINDINGS:  Gallbladder:  No gallstones or wall thickening visualized. No sonographic Murphy sign noted.  Common bile duct:  Diameter: Normal at 4 mm  Liver:  The liver is uniformly increased in echogenicity. No and a duct dilatation.  IVC:  No abnormality visualized.  Pancreas:  Visualized portion unremarkable.  Spleen:  Size and appearance within normal limits.  Right Kidney:  Length: 12.2 cm. Echogenicity within normal limits. No mass or hydronephrosis visualized.  Left Kidney:  Length: 1.5 cm. Echogenicity within normal limits. No mass or hydronephrosis visualized.  Abdominal aorta:  No aneurysm visualized.  Other findings:  None.  IMPRESSION: 1. Normal gallbladder. 2. Echogenic liver commonly represents hepatic steatosis.   Electronically Signed   By: Suzy Bouchard M.D.   On: 03/10/2014 17:41   Dg Abd Acute W/chest  03/10/2014   CLINICAL DATA:  urq pain  EXAM: ACUTE ABDOMEN SERIES (ABDOMEN 2 VIEW & CHEST 1 VIEW)  COMPARISON:  Two-view chest 02/25/2014  FINDINGS: There is no evidence of dilated bowel loops or free intraperitoneal air. No radiopaque calculi or other significant radiographic abnormality is seen. Heart size and mediastinal contours are within normal limits. Both lungs are clear.  IMPRESSION: Negative abdominal radiographs.  No acute cardiopulmonary disease.   Electronically Signed   By: Margaree Mackintosh M.D.   On: 03/10/2014 15:28     EKG Interpretation None      MDM   Final diagnoses:  Right upper quadrant pain    Patient's workup is negative. Has dirty catch urine, no urinary symptoms. No LFT abnormalities or pancreatitis. Pain is mostly focused in RUQ. Xray from earlier shows moderate stool burden there, could be cause of her pain with negative u/s and no evidence of kidney stones. Do not feel CT warranted at this time. Has hematuria but is on period. As she feels improved, will d/c with pain meds and encourage PCP f/u.    Ephraim Hamburger, MD 03/11/14 [phone]

## 2014-03-10 NOTE — ED Notes (Addendum)
Per EMS: Pt has had right upper abdominal pain that began on Monday and was referred to ED by urgent care. Pt reports nausea and emesis, however denies diarrhea. Pt states that she is unable to keep down fluids. Pt has a history of GERD. Pt states she has not had a bowel movement since Sunday. Pt is A/O x4 and in NAD. Pt was given 100 mcg of Fentanyl in route, last does at 1454.

## 2014-03-10 NOTE — Progress Notes (Signed)
Subjective:    Patient ID: Emily Mathis, female    DOB: 07-03-1966, 48 y.o.   MRN: [id number]   PCP: Julian Hy, MD  Chief Complaint  Patient presents with  . Shortness of Breath    chest feels tight at times, vomit once yerstday,  . Abdominal Pain    right upper abdominal pain, started 2 days ago, pain caused possible gas, pt states pain spread to the chest, did not eat since 3pm yesterday   Medications, allergies, past medical history, surgical history, family history, social history and problem list reviewed and updated.  . Patient Active Problem List   Diagnosis Date Noted  . Diabetes mellitus type 2, uncomplicated 09/14/2013  . Keloid scar, cervical incision 09/02/2013  . Reaction, situational, acute, to stress 07/02/2013  . 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 Severe obesity (BMI >= 40) 03/27/2013  . GERD (gastroesophageal reflux disease) 02/15/2013  . Hypocalcemia 01/07/2013  . Chest pain on exertion 07/24/2011     Prior to Admission medications   Medication Sig Start Date End Date Taking? Authorizing Timotheus Salm  albuterol (PROVENTIL HFA;VENTOLIN HFA) 108 (90 BASE) MCG/ACT inhaler Inhale 2 puffs into the lungs daily as needed. For shortness of breath   Yes Historical Labarron Durnin, MD  ALPRAZolam (XANAX) 0.5 MG tablet Take 1 tablet (0.5 mg total) by mouth at bedtime as needed for sleep.   Yes [name] S Jeffery, PA-C  beclomethasone (QVAR) 80 MCG/ACT inhaler Inhale 2 puffs into the lungs 2 (two) times daily. 06/18/13  Yes [name] S Jeffery, PA-C  calcitRIOL (ROCALTROL) 0.25 MCG capsule Take 1 capsule (0.25 mcg total) by mouth 2 (two) times daily. 12/23/12  Yes Velora Heckler, MD  chlorthalidone (HYGROTON) 25 MG tablet Take 25 mg by mouth daily.   Yes Historical Matson Welch, MD  levothyroxine (SYNTHROID) 175 MCG tablet Take 1 tablet (175 mcg total) by mouth daily before breakfast. 06/15/13  Yes Velora Heckler, MD  meloxicam (MOBIC) 15 MG tablet Take 15 mg by mouth daily.    Yes Historical Eddy Termine, MD  metFORMIN (GLUCOPHAGE) 500 MG tablet Take 1 tablet (500 mg total) by mouth daily with breakfast. 07/02/13  Yes [name] S Jeffery, PA-C  omeprazole (PRILOSEC) 20 MG capsule Take 20 mg by mouth daily. Take 2 daily-patient been taking one when she needs it only.   Yes Historical Saber Dickerman, MD  triamcinolone cream (KENALOG) 0.1 % Apply 1 application topically 2 (two) times daily. 09/02/13  Yes Velora Heckler, MD    HPI  She is brought back urgently due to RIGHT sided pain and shortness of breath. The pain began on Monday 6/22 while she was at work.  She thought it was gas pain, and took some Gas-X without relief.  She's tried drinking sodas, also without benefit.  "I can burp, but it won't pass." Last BM was 6/21. Last food was 6/23 about 3 pm. She notes that eating made no difference in her discomfort, but she was in so much discomfort that she didn't eat much.  Today the pain is spreading up into the back and chest wall.  She's having trouble getting comfortable. Nothing seems to help. No positions are better than others.  LMP 6/14, but she notes that she has been bleeding off and on since then and uses no method of contraception.  No fever, chills.  She vomited this morning after drinking some ginger ale.  Review of Systems As above.    Objective:   Physical Exam  Constitutional: She is oriented  to person, place, and time. She appears well-developed and well-nourished. She is active and cooperative. She appears distressed (tearful, moaning, rocking in chair. Doesn't want to lie down on the examination table.).  BP 158/104  Pulse 108  Temp(Src) 97.7 F (36.5 C) (Oral)  Resp 22  Ht 5\' 3"  (1.6 m)  Wt 276 lb 4.8 oz (125.329 kg)  BMI 48.96 kg/m2  SpO2 98%  LMP 03/11/2013   Eyes: Conjunctivae are normal. No scleral icterus.  Neck: Neck supple. No thyromegaly present.  Cardiovascular: Regular rhythm, normal heart sounds and intact distal pulses.   Pulmonary/Chest:  Effort normal and breath sounds normal.  Abdominal: Soft. There is tenderness in the right upper quadrant and epigastric area.  On examination with the patient in the chair, she is tender on the RIGHT abdomen, chest wall, and back as well as the epigastrum and sternum.  Once she is lying down, her pain increases, especially in the back. She is tender along the RIGHT chest wall, but not in the upper abdomen or epigastrum.  Lymphadenopathy:    She has no cervical adenopathy.  Neurological: She is alert and oriented to person, place, and time.  Skin: Skin is warm and dry.  Psychiatric: She has a normal mood and affect. Her behavior is normal.    Acute Abdominal Series: [facility] reading (PRIMARY) by  Dr. Patsy Lager.  Large stool volume in the RIGHT upper abdomen, otherwise normal series.  Results for orders placed in visit on 03/10/14  POCT UA - MICROSCOPIC ONLY      Result Value Ref Range   WBC, Ur, HPF, POC 2-3     RBC, urine, microscopic 1-5     Bacteria, U Microscopic 1+     Mucus, UA +     Epithelial cells, urine per micros 5-10     Crystals, Ur, HPF, POC neg     Casts, Ur, LPF, POC neg     Yeast, UA neg    POCT URINALYSIS DIPSTICK      Result Value Ref Range   Color, UA dark yellow     Clarity, UA clear     Glucose, UA neg     Bilirubin, UA neg     Ketones, UA neg     Spec Grav, UA 1.020     Blood, UA large     pH, UA 6.0     Protein, UA 30     Urobilinogen, UA 0.2     Nitrite, UA neg     Leukocytes, UA Negative    POCT URINE PREGNANCY      Result Value Ref Range   Preg Test, Ur Negative           Assessment & Plan:  1. Right flank pain Considering kidney stone vs. Gallbladder disease. As her pain is progressively worsening, elect to transport to the ED by EMS for additional evaluation/treatment. [facility] notified. - DG Abd Acute W/Chest; see reading above. - POCT UA - Microscopic Only - POCT urinalysis dipstick - POCT urine pregnancy   Fernande Bras,  PA-C Physician Assistant-Certified [facility]

## 2014-03-10 NOTE — Discharge Instructions (Signed)

## 2014-05-11 ENCOUNTER — Emergency Department (HOSPITAL_COMMUNITY)
Admission: EM | Admit: 2014-05-11 | Discharge: 2014-05-11 | Disposition: A | Discharge status: Home / Self Care | Payer: BC Managed Care – PPO | Attending: Emergency Medicine | Admitting: Emergency Medicine

## 2014-05-11 ENCOUNTER — Emergency Department (HOSPITAL_COMMUNITY): Payer: BC Managed Care – PPO

## 2014-05-11 ENCOUNTER — Encounter (HOSPITAL_COMMUNITY): Payer: Self-pay | Admitting: Emergency Medicine

## 2014-05-11 DIAGNOSIS — IMO0002 Reserved for concepts with insufficient information to code with codable children: Secondary | ICD-10-CM | POA: Insufficient documentation

## 2014-05-11 DIAGNOSIS — Z9889 Other specified postprocedural states: Secondary | ICD-10-CM | POA: Diagnosis not present

## 2014-05-11 DIAGNOSIS — K219 Gastro-esophageal reflux disease without esophagitis: Secondary | ICD-10-CM | POA: Insufficient documentation

## 2014-05-11 DIAGNOSIS — E079 Disorder of thyroid, unspecified: Secondary | ICD-10-CM | POA: Diagnosis not present

## 2014-05-11 DIAGNOSIS — J45901 Unspecified asthma with (acute) exacerbation: Secondary | ICD-10-CM | POA: Diagnosis not present

## 2014-05-11 DIAGNOSIS — Z79899 Other long term (current) drug therapy: Secondary | ICD-10-CM | POA: Diagnosis not present

## 2014-05-11 DIAGNOSIS — E119 Type 2 diabetes mellitus without complications: Secondary | ICD-10-CM | POA: Diagnosis not present

## 2014-05-11 DIAGNOSIS — F419 Anxiety disorder, unspecified: Secondary | ICD-10-CM

## 2014-05-11 DIAGNOSIS — Z791 Long term (current) use of non-steroidal anti-inflammatories (NSAID): Secondary | ICD-10-CM | POA: Diagnosis not present

## 2014-05-11 DIAGNOSIS — I10 Essential (primary) hypertension: Secondary | ICD-10-CM | POA: Diagnosis not present

## 2014-05-11 DIAGNOSIS — F411 Generalized anxiety disorder: Secondary | ICD-10-CM | POA: Insufficient documentation

## 2014-05-11 DIAGNOSIS — Z8739 Personal history of other diseases of the musculoskeletal system and connective tissue: Secondary | ICD-10-CM | POA: Insufficient documentation

## 2014-05-11 DIAGNOSIS — R0602 Shortness of breath: Secondary | ICD-10-CM | POA: Diagnosis present

## 2014-05-11 DIAGNOSIS — Z91040 Latex allergy status: Secondary | ICD-10-CM | POA: Insufficient documentation

## 2014-05-11 DIAGNOSIS — Z8543 Personal history of malignant neoplasm of ovary: Secondary | ICD-10-CM | POA: Diagnosis not present

## 2014-05-11 LAB — BASIC METABOLIC PANEL
Anion gap: 17 — ABNORMAL HIGH (ref 5–15)
BUN: 15 mg/dL (ref 6–23)
CO2: 25 mEq/L (ref 19–32)
Calcium: 8.6 mg/dL (ref 8.4–10.5)
Chloride: 96 mEq/L (ref 96–112)
Creatinine, Ser: 0.95 mg/dL (ref 0.50–1.10)
GFR calc Af Amer: 81 mL/min — ABNORMAL LOW (ref >90)
GFR calc non Af Amer: 70 mL/min — ABNORMAL LOW (ref >90)
GLUCOSE: 123 mg/dL — AB (ref 70–99)
POTASSIUM: 2.9 meq/L — AB (ref 3.7–5.3)
SODIUM: 138 meq/L (ref 137–147)

## 2014-05-11 LAB — I-STAT VENOUS BLOOD GAS, ED
ACID-BASE EXCESS: 7 mmol/L — AB (ref 0.0–2.0)
Acid-Base Excess: 5 mmol/L — ABNORMAL HIGH (ref 0.0–2.0)
BICARBONATE: 29.4 meq/L — AB (ref 20.0–24.0)
Bicarbonate: 30 mEq/L — ABNORMAL HIGH (ref 20.0–24.0)
O2 SAT: 79 %
O2 Saturation: 95 %
PCO2 VEN: 36.9 mmHg — AB (ref 45.0–50.0)
PH VEN: 7.461 — AB (ref 7.250–7.300)
TCO2: 31 mmol/L (ref 0–100)
TCO2: 31 mmol/L (ref 0–100)
pCO2, Ven: 41.2 mmHg — ABNORMAL LOW (ref 45.0–50.0)
pH, Ven: 7.518 — ABNORMAL HIGH (ref 7.250–7.300)
pO2, Ven: 39 mmHg (ref 30.0–45.0)
pO2, Ven: 73 mmHg — ABNORMAL HIGH (ref 30.0–45.0)

## 2014-05-11 LAB — CBC
HCT: 36 % (ref 36.0–46.0)
Hemoglobin: 12.3 g/dL (ref 12.0–15.0)
MCH: 31.5 pg (ref 26.0–34.0)
MCHC: 34.2 g/dL (ref 30.0–36.0)
MCV: 92.1 fL (ref 78.0–100.0)
Platelets: 548 10*3/uL — ABNORMAL HIGH (ref 150–400)
RBC: 3.91 MIL/uL (ref 3.87–5.11)
RDW: 14 % (ref 11.5–15.5)
WBC: 11 10*3/uL — AB (ref 4.0–10.5)

## 2014-05-11 LAB — D-DIMER, QUANTITATIVE: D-Dimer, Quant: 0.28 ug/mL-FEU (ref 0.00–0.48)

## 2014-05-11 LAB — PRO B NATRIURETIC PEPTIDE: PRO B NATRI PEPTIDE: 39.9 pg/mL (ref 0–125)

## 2014-05-11 LAB — I-STAT CG4 LACTIC ACID, ED: Lactic Acid, Venous: 2.15 mmol/L (ref 0.5–2.2)

## 2014-05-11 LAB — I-STAT TROPONIN, ED: Troponin i, poc: 0 ng/mL (ref 0.00–0.08)

## 2014-05-11 MED ORDER — POTASSIUM CHLORIDE CRYS ER 20 MEQ PO TBCR
30.0000 meq | EXTENDED_RELEASE_TABLET | Freq: Once | ORAL | Status: AC
  Administered 2014-05-11: 30 meq via ORAL
  Filled 2014-05-11: qty 1.5

## 2014-05-11 MED ORDER — ONDANSETRON HCL 4 MG/2ML IJ SOLN
4.0000 mg | Freq: Once | INTRAMUSCULAR | Status: AC
  Administered 2014-05-11: 4 mg via INTRAVENOUS
  Filled 2014-05-11: qty 2

## 2014-05-11 MED ORDER — ALBUTEROL SULFATE (2.5 MG/3ML) 0.083% IN NEBU
5.0000 mg | INHALATION_SOLUTION | Freq: Once | RESPIRATORY_TRACT | Status: AC
Start: 2014-05-11 — End: 2014-05-11
  Administered 2014-05-11: 5 mg via RESPIRATORY_TRACT
  Filled 2014-05-11: qty 6

## 2014-05-11 MED ORDER — FENTANYL CITRATE 0.05 MG/ML IJ SOLN
50.0000 ug | Freq: Once | INTRAMUSCULAR | Status: AC
  Administered 2014-05-11: 50 ug via INTRAVENOUS
  Filled 2014-05-11: qty 2

## 2014-05-11 MED ORDER — FLUTICASONE PROPIONATE 50 MCG/ACT NA SUSP: 1.0000 | Freq: Every day | NASAL | Status: DC

## 2014-05-11 MED ORDER — IPRATROPIUM BROMIDE 0.02 % IN SOLN
0.5000 mg | Freq: Once | RESPIRATORY_TRACT | Status: AC
  Administered 2014-05-11: 0.5 mg via RESPIRATORY_TRACT
  Filled 2014-05-11: qty 2.5

## 2014-05-11 MED ORDER — PREDNISONE 10 MG PO TABS: 40.0000 mg | ORAL_TABLET | Freq: Every day | ORAL | Status: DC

## 2014-05-11 MED ORDER — PREDNISONE 20 MG PO TABS
60.0000 mg | ORAL_TABLET | Freq: Once | ORAL | Status: AC
  Administered 2014-05-11: 60 mg via ORAL
  Filled 2014-05-11: qty 3

## 2014-05-11 MED ORDER — ALBUTEROL SULFATE (2.5 MG/3ML) 0.083% IN NEBU
5.0000 mg | INHALATION_SOLUTION | Freq: Once | RESPIRATORY_TRACT | Status: AC
  Administered 2014-05-11: 5 mg via RESPIRATORY_TRACT
  Filled 2014-05-11: qty 6

## 2014-05-11 MED ORDER — LORAZEPAM 1 MG PO TABS
1.0000 mg | ORAL_TABLET | Freq: Once | ORAL | Status: AC
  Administered 2014-05-11: 1 mg via ORAL
  Filled 2014-05-11: qty 1

## 2014-05-11 NOTE — ED Provider Notes (Signed)
  Face-to-face evaluation   History: She complains of cough for 5 weeks. Sometimes the cough is so bad, it causes her to vomit. She has nasal congestion. She has had frequent problems with shortness of breath in the past. She has never seen a pulmonologist.  Physical exam: Alert, anxious, coughing. The cough is nonproductive. Lungs good air movement bilaterally without wheezes, rales, or rhonchi.   Medical screening examination/treatment/procedure(s) were conducted as a shared visit with non-physician practitioner(s) and myself.  I personally evaluated the patient during the encounter  Richarda Blade, MD 05/12/14 0030

## 2014-05-11 NOTE — ED Notes (Signed)
UP TO BR GAIT STEADY

## 2014-05-11 NOTE — Discharge Instructions (Signed)
Asthma Attack Prevention Although there is no way to prevent asthma from starting, you can take steps to control the disease and reduce its symptoms. Learn about your asthma and how to control it. Take an active role to control your asthma by working with your health care provider to create and follow an asthma action plan. An asthma action plan guides you in:  Taking your medicines properly.  Avoiding things that set off your asthma or make your asthma worse (asthma triggers).  Tracking your level of asthma control.  Responding to worsening asthma.  Seeking emergency care when needed. To track your asthma, keep records of your symptoms, check your peak flow number using a handheld device that shows how well air moves out of your lungs (peak flow meter), and get regular asthma checkups.  WHAT ARE SOME WAYS TO PREVENT AN ASTHMA ATTACK?  Take medicines as directed by your health care provider.  Keep track of your asthma symptoms and level of control.  With your health care provider, write a detailed plan for taking medicines and managing an asthma attack. Then be sure to follow your action plan. Asthma is an ongoing condition that needs regular monitoring and treatment.  Identify and avoid asthma triggers. Many outdoor allergens and irritants (such as pollen, mold, cold air, and air pollution) can trigger asthma attacks. Find out what your asthma triggers are and take steps to avoid them.  Monitor your breathing. Learn to recognize warning signs of an attack, such as coughing, wheezing, or shortness of breath. Your lung function may decrease before you notice any signs or symptoms, so regularly measure and record your peak airflow with a home peak flow meter.  Identify and treat attacks early. If you act quickly, you are less likely to have a severe attack. You will also need less medicine to control your symptoms. When your peak flow measurements decrease and alert you to an upcoming attack,  take your medicine as instructed and immediately stop any activity that may have triggered the attack. If your symptoms do not improve, get medical help.  Pay attention to increasing quick-relief inhaler use. If you find yourself relying on your quick-relief inhaler, your asthma is not under control. See your health care provider about adjusting your treatment. WHAT CAN MAKE MY SYMPTOMS WORSE? A number of common things can set off or make your asthma symptoms worse and cause temporary increased inflammation of your airways. Keep track of your asthma symptoms for several weeks, detailing all the environmental and emotional factors that are linked with your asthma. When you have an asthma attack, go back to your asthma diary to see which factor, or combination of factors, might have contributed to it. Once you know what these factors are, you can take steps to control many of them. If you have allergies and asthma, it is important to take asthma prevention steps at home. Minimizing contact with the substance to which you are allergic will help prevent an asthma attack. Some triggers and ways to avoid these triggers are: Animal Dander:  Some people are allergic to the flakes of skin or dried saliva from animals with fur or feathers.   There is no such thing as a hypoallergenic dog or cat breed. All dogs or cats can cause allergies, even if they don't shed.  Keep these pets out of your home.  If you are not able to keep a pet outdoors, keep the pet out of your bedroom and other sleeping areas at all  times, and keep the door closed.  Remove carpets and furniture covered with cloth from your home. If that is not possible, keep the pet away from fabric-covered furniture and carpets. Dust Mites: Many people with asthma are allergic to dust mites. Dust mites are tiny bugs that are found in every home in mattresses, pillows, carpets, fabric-covered furniture, bedcovers, clothes, stuffed toys, and other  fabric-covered items.   Cover your mattress in a special dust-proof cover.  Cover your pillow in a special dust-proof cover, or wash the pillow each week in hot water. Water must be hotter than 130 F (54.4 C) to kill dust mites. Cold or warm water used with detergent and bleach can also be effective.  Wash the sheets and blankets on your bed each week in hot water.  Try not to sleep or lie on cloth-covered cushions.  Call ahead when traveling and ask for a smoke-free hotel room. Bring your own bedding and pillows in case the hotel only supplies feather pillows and down comforters, which may contain dust mites and cause asthma symptoms.  Remove carpets from your bedroom and those laid on concrete, if you can.  Keep stuffed toys out of the bed, or wash the toys weekly in hot water or cooler water with detergent and bleach. Cockroaches: Many people with asthma are allergic to the droppings and remains of cockroaches.   Keep food and garbage in closed containers. Never leave food out.  Use poison baits, traps, powders, gels, or paste (for example, boric acid).  If a spray is used to kill cockroaches, stay out of the room until the odor goes away. Indoor Mold:  Fix leaky faucets, pipes, or other sources of water that have mold around them.  Clean floors and moldy surfaces with a fungicide or diluted bleach.  Avoid using humidifiers, vaporizers, or swamp coolers. These can spread molds through the air. Pollen and Outdoor Mold:  When pollen or mold spore counts are high, try to keep your windows closed.  Stay indoors with windows closed from late morning to afternoon. Pollen and some mold spore counts are highest at that time.  Ask your health care provider whether you need to take anti-inflammatory medicine or increase your dose of the medicine before your allergy season starts. Other Irritants to Avoid:  Tobacco smoke is an irritant. If you smoke, ask your health care provider how  you can quit. Ask family members to quit smoking, too. Do not allow smoking in your home or car.  If possible, do not use a wood-burning stove, kerosene heater, or fireplace. Minimize exposure to all sources of smoke, including incense, candles, fires, and fireworks.  Try to stay away from strong odors and sprays, such as perfume, talcum powder, hair spray, and paints.  Decrease humidity in your home and use an indoor air cleaning device. Reduce indoor humidity to below 60%. Dehumidifiers or central air conditioners can do this.  Decrease house dust exposure by changing furnace and air cooler filters frequently.  Try to have someone else vacuum for you once or twice a week. Stay out of rooms while they are being vacuumed and for a short while afterward.  If you vacuum, use a dust mask from a hardware store, a double-layered or microfilter vacuum cleaner bag, or a vacuum cleaner with a HEPA filter.  Sulfites in foods and beverages can be irritants. Do not drink beer or wine or eat dried fruit, processed potatoes, or shrimp if they cause asthma symptoms.  Cold  air can trigger an asthma attack. Cover your nose and mouth with a scarf on cold or windy days.  Several health conditions can make asthma more difficult to manage, including a runny nose, sinus infections, reflux disease, psychological stress, and sleep apnea. Work with your health care provider to manage these conditions.  Avoid close contact with people who have a respiratory infection such as a cold or the flu, since your asthma symptoms may get worse if you catch the infection. Wash your hands thoroughly after touching items that may have been handled by people with a respiratory infection.  Get a flu shot every year to protect against the flu virus, which often makes asthma worse for days or weeks. Also get a pneumonia shot if you have not previously had one. Unlike the flu shot, the pneumonia shot does not need to be given  yearly. Medicines:  Talk to your health care provider about whether it is safe for you to take aspirin or non-steroidal anti-inflammatory medicines (NSAIDs). In a small number of people with asthma, aspirin and NSAIDs can cause asthma attacks. These medicines must be avoided by people who have known aspirin-sensitive asthma. It is important that people with aspirin-sensitive asthma read labels of all over-the-counter medicines used to treat pain, colds, coughs, and fever.  Beta-blockers and ACE inhibitors are other medicines you should discuss with your health care provider. HOW CAN I FIND OUT WHAT I AM ALLERGIC TO? Ask your asthma health care provider about allergy skin testing or blood testing (the RAST test) to identify the allergens to which you are sensitive. If you are found to have allergies, the most important thing to do is to try to avoid exposure to any allergens that you are sensitive to as much as possible. Other treatments for allergies, such as medicines and allergy shots (immunotherapy) are available.  CAN I EXERCISE? Follow your health care provider's advice regarding asthma treatment before exercising. It is important to maintain a regular exercise program, but vigorous exercise or exercise in cold, humid, or dry environments can cause asthma attacks, especially for those people who have exercise-induced asthma. Document Released: 08/22/2009 Document Revised: 09/08/2013 Document Reviewed: 03/11/2013 [facility] Patient Information 2015 [place], [place]. This information is not intended to replace advice given to you by your health care provider. Make sure you discuss any questions you have with your health care provider.  Asthma Asthma is a recurring condition in which the airways tighten and narrow. Asthma can make it difficult to breathe. It can cause coughing, wheezing, and shortness of breath. Asthma episodes, also called asthma attacks, range from minor to life-threatening. Asthma  cannot be cured, but medicines and lifestyle changes can help control it. CAUSES Asthma is believed to be caused by inherited (genetic) and environmental factors, but its exact cause is unknown. Asthma may be triggered by allergens, lung infections, or irritants in the air. Asthma triggers are different for each person. Common triggers include:   Animal dander.  Dust mites.  Cockroaches.  Pollen from trees or grass.  Mold.  Smoke.  Air pollutants such as dust, household cleaners, hair sprays, aerosol sprays, paint fumes, strong chemicals, or strong odors.  Cold air, weather changes, and winds (which increase molds and pollens in the air).  Strong emotional expressions such as crying or laughing hard.  Stress.  Certain medicines (such as aspirin) or types of drugs (such as beta-blockers).  Sulfites in foods and drinks. Foods and drinks that may contain sulfites include dried fruit, potato  chips, and sparkling grape juice. °· Infections or inflammatory conditions such as the flu, a cold, or an inflammation of the nasal membranes (rhinitis). °· Gastroesophageal reflux disease (GERD). °· Exercise or strenuous activity. °SYMPTOMS °Symptoms may occur immediately after asthma is triggered or many hours later. Symptoms include: °· Wheezing. °· Excessive nighttime or early morning coughing. °· Frequent or severe coughing with a common cold. °· Chest tightness. °· Shortness of breath. °DIAGNOSIS  °The diagnosis of asthma is made by a review of your medical history and a physical exam. Tests may also be performed. These may include: °· Lung function studies. These tests show how much air you breathe in and out. °· Allergy tests. °· Imaging tests such as X-rays. °TREATMENT  °Asthma cannot be cured, but it can usually be controlled. Treatment involves identifying and avoiding your asthma triggers. It also involves medicines. There are 2 classes of medicine used for asthma treatment:  °· Controller  medicines. These prevent asthma symptoms from occurring. They are usually taken every day. °· Reliever or rescue medicines. These quickly relieve asthma symptoms. They are used as needed and provide short-term relief. °Your health care provider will help you create an asthma action plan. An asthma action plan is a written plan for managing and treating your asthma attacks. It includes a list of your asthma triggers and how they may be avoided. It also includes information on when medicines should be taken and when their dosage should be changed. An action plan may also involve the use of a device called a peak flow meter. A peak flow meter measures how well the lungs are working. It helps you monitor your condition. °HOME CARE INSTRUCTIONS  °· Take medicines only as directed by your health care provider. Speak with your health care provider if you have questions about how or when to take the medicines. °· Use a peak flow meter as directed by your health care provider. Record and keep track of readings. °· Understand and use the action plan to help minimize or stop an asthma attack without needing to seek medical care. °· Control your home environment in the following ways to help prevent asthma attacks: °¨ Do not smoke. Avoid being exposed to secondhand smoke. °¨ Change your heating and air conditioning filter regularly. °¨ Limit your use of fireplaces and wood stoves. °¨ Get rid of pests (such as roaches and mice) and their droppings. °¨ Throw away plants if you see mold on them. °¨ Clean your floors and dust regularly. Use unscented cleaning products. °¨ Try to have someone else vacuum for you regularly. Stay out of rooms while they are being vacuumed and for a short while afterward. If you vacuum, use a dust mask from a hardware store, a double-layered or microfilter vacuum cleaner bag, or a vacuum cleaner with a HEPA filter. °¨ Replace carpet with wood, tile, or vinyl flooring. Carpet can trap dander and  dust. °¨ Use allergy-proof pillows, mattress covers, and box spring covers. °¨ Wash bed sheets and blankets every week in hot water and dry them in a dryer. °¨ Use blankets that are made of polyester or cotton. °¨ Clean bathrooms and kitchens with bleach. If possible, have someone repaint the walls in these rooms with mold-resistant paint. Keep out of the rooms that are being cleaned and painted. °¨ Wash hands frequently. °SEEK MEDICAL CARE IF:  °· You have wheezing, shortness of breath, or a cough even if taking medicine to prevent attacks. °· The colored mucus   you cough up (sputum) is thicker than usual. °· Your sputum changes from clear or white to yellow, green, gray, or bloody. °· You have any problems that may be related to the medicines you are taking (such as a rash, itching, swelling, or trouble breathing). °· You are using a reliever medicine more than 2-3 times per week. °· Your peak flow is still at 50-79% of your personal best after following your action plan for 1 hour. °· You have a fever. °SEEK IMMEDIATE MEDICAL CARE IF:  °· You seem to be getting worse and are unresponsive to treatment during an asthma attack. °· You are short of breath even at rest. °· You get short of breath when doing very little physical activity. °· You have difficulty eating, drinking, or talking due to asthma symptoms. °· You develop chest pain. °· You develop a fast heartbeat. °· You have a bluish color to your lips or fingernails. °· You are light-headed, dizzy, or faint. °· Your peak flow is less than 50% of your personal best. °MAKE SURE YOU:  °· Understand these instructions. °· Will watch your condition. °· Will get help right away if you are not doing well or get worse. °Document Released: 09/03/2005 Document Revised: 01/18/2014 Document Reviewed: 04/02/2013 °[employer]® Patient Information ©2015 [employer]. This information is not intended to replace advice given to you by your health care provider. Make sure you  discuss any questions you have with your health care provider. ° °

## 2014-05-11 NOTE — ED Notes (Signed)
Pt arrived with complaints of bad cough for 5 weeks and sob.  Pt denies any pain or swelling.  Pt is tachypneic at triage.

## 2014-05-11 NOTE — ED Notes (Signed)
Lab results given to Dr.Wentz.

## 2014-05-11 NOTE — ED Provider Notes (Signed)
Level V Caveat- pt in respiratory distress  Patient here with SOB, tachypnea, tachycardia. She reports being sick recently and coughing with pain. Over the past few days she has become increasingly worse. She currently now reports being unable to catch her breath, feels as though she can not clear her lungs.She denies any swelling of her lower extremities. Denies having orthopnea unless she develops a "coughing fit". She is only able to speak at 2-3 words per second so obtaining an HPI is difficult.  PMH significant for asthma (denies requiring intubation or ICU stay), thyroidectomy, GERD, hypertension, anxiety, GERD, headaches, cancer, and diabetes.     CSN: [id number]     Arrival date & time 05/11/14  1521 History   First MD Initiated Contact with Patient 05/11/14 1613     Chief Complaint  Patient presents with  . Shortness of Breath   (Consider location/radiation/quality/duration/timing/severity/associated sxs/prior Treatment) HPI Past Medical History  Diagnosis Date  . Hyperlipidemia   . Asthma   . Thyroid disease   . Arthritis   . Hypertension     per Dr. Irven Shelling note 08/30/2011  . Anxiety   . GERD (gastroesophageal reflux disease)   . Headache(784.0)   . Cancer     Pre-Cancer-Ovarian  . Diabetes mellitus without complication    Past Surgical History  Procedure Laterality Date  . Cesarean section  1995  . Cesarean section  1996  . Cardiac catheterization  2013  . Thyroidectomy N/A 12/18/2012    Procedure: TOTAL THYROIDECTOMY;  Surgeon: Earnstine Regal, MD;  Location: [facility];  Service: General;  Laterality: N/A;   Family History  Problem Relation Age of Onset  . Heart disease Mother   . Stroke Mother   . Hypertension Mother   . Heart disease Father   . Colon cancer Father   . Hypertension Father   . Breast cancer Paternal Aunt   . Cancer Paternal Grandmother   . Cancer Paternal Aunt   . Asthma Daughter   . Seizures Daughter   . Hypertension Sister   . Mental  illness Daughter 27    suicide attempt 06/2013   History  Substance Use Topics  . Smoking status: Never Smoker   . Smokeless tobacco: Never Used  . Alcohol Use: Yes     Comment: Occasional   OB History   Grav Para Term Preterm Abortions TAB SAB Ect Mult Living                 Review of Systems  Level V Caveat- pt in respiratory distress  Allergies  Latex; Dilaudid; and Hydrocodone  Home Medications   Prior to Admission medications   Medication Sig Start Date End Date Taking? Authorizing Provider  albuterol (PROVENTIL HFA;VENTOLIN HFA) 108 (90 BASE) MCG/ACT inhaler Inhale 2 puffs into the lungs daily as needed. For shortness of breath   Yes Historical Provider, MD  ALPRAZolam Duanne Moron) 0.5 MG tablet Take 0.5 mg by mouth at bedtime as needed for sleep.   Yes Historical Provider, MD  beclomethasone (QVAR) 80 MCG/ACT inhaler Inhale 2 puffs into the lungs 2 (two) times daily.   Yes Historical Provider, MD  chlorthalidone (HYGROTON) 25 MG tablet Take 25 mg by mouth daily.   Yes Historical Provider, MD  HYDROcodone-acetaminophen (NORCO/VICODIN) 5-325 MG per tablet Take 1-2 tablets by mouth every 4 (four) hours as needed for moderate pain.   Yes Historical Provider, MD  levothyroxine (SYNTHROID, LEVOTHROID) 175 MCG tablet Take 175 mcg by mouth daily before breakfast.  Yes Historical Provider, MD  meloxicam (MOBIC) 15 MG tablet Take 15 mg by mouth daily.   Yes Historical Provider, MD  metFORMIN (GLUCOPHAGE) 500 MG tablet Take 500 mg by mouth daily with breakfast.   Yes Historical Provider, MD  pantoprazole (PROTONIX) 40 MG tablet Take 40 mg by mouth daily.   Yes Historical Provider, MD  simethicone (MYLICON) 80 MG chewable tablet Chew 80 mg by mouth every 6 (six) hours as needed for flatulence.   Yes Historical Provider, MD  triamcinolone cream (KENALOG) 0.1 % Apply 1 application topically 2 (two) times daily.   Yes Historical Provider, MD   BP 120/74  Pulse 105  Temp(Src) 97.8 F (36.6  C) (Oral)  Resp 25  SpO2 91%  LMP 03/11/2013 Physical Exam  Nursing note and vitals reviewed. Constitutional: She appears well-developed and well-nourished. She appears distressed (respiratory distress).  HENT:  Head: Normocephalic and atraumatic.  Eyes: Pupils are equal, round, and reactive to light.  Neck: Normal range of motion. Neck supple.  Cardiovascular: Normal rate and regular rhythm.   Pulmonary/Chest: Accessory muscle usage present. Tachypnea noted. She is in respiratory distress (decreased airway movement). She has decreased breath sounds.  Pt unable to say more than a few words at a time. Increased effort of breathing, retractions.   Abdominal: Soft.  Neurological: She is alert.  Pt is globally fatigued  Skin: Skin is warm. She is diaphoretic.    ED Course  Procedures (including critical care time) Labs Review Labs Reviewed  BASIC METABOLIC PANEL - Abnormal; Notable for the following:    Potassium 2.9 (*)    Glucose, Bld 123 (*)    GFR calc non Af Amer 70 (*)    GFR calc Af Amer 81 (*)    Anion gap 17 (*)    All other components within normal limits  CBC - Abnormal; Notable for the following:    WBC 11.0 (*)    Platelets 548 (*)    All other components within normal limits  I-STAT VENOUS BLOOD GAS, ED - Abnormal; Notable for the following:    pH, Ven 7.518 (*)    pCO2, Ven 36.9 (*)    Bicarbonate 30.0 (*)    Acid-Base Excess 7.0 (*)    All other components within normal limits  I-STAT VENOUS BLOOD GAS, ED - Abnormal; Notable for the following:    pH, Ven 7.461 (*)    pCO2, Ven 41.2 (*)    pO2, Ven 73.0 (*)    Bicarbonate 29.4 (*)    Acid-Base Excess 5.0 (*)    All other components within normal limits  PRO B NATRIURETIC PEPTIDE  D-DIMER, QUANTITATIVE  I-STAT TROPOININ, ED  I-STAT CG4 LACTIC ACID, ED    Imaging Review Dg Chest Port 1 View  05/11/2014   CLINICAL DATA:  Tachypnea and difficulty breathing  EXAM: PORTABLE CHEST - 1 VIEW  COMPARISON:   March 10, 2014  FINDINGS: There is no appreciable edema or consolidation. The heart size and pulmonary vascularity are normal. No adenopathy. No bone lesions. There is postoperative change in the thyroid region.  IMPRESSION: No edema or consolidation.   Electronically Signed   By: Lowella Grip M.D.   On: 05/11/2014 17:24     EKG Interpretation   Date/Time:  Tuesday May 11 2014 15:31:25 EDT Ventricular Rate:  118 PR Interval:  150 QRS Duration: 72 QT Interval:  334 QTC Calculation: 468 R Axis:   75 Text Interpretation:  Sinus tachycardia ST \\T \ T  wave abnormality,  consider inferior ischemia Abnormal ECG Confirmed by DELOS  MD, DOUGLAS  [phone]) on 05/11/2014 3:54:57 PM      MDM   Final diagnoses:  Asthma exacerbation   6: 39 pm Dr. Eulis Foster saw the patient early in her clinical course and agrees with plan to give breathing treatments. He has added on a d-dimer, which was fortunately negative. Patients potassium is 2.9, replaced in the ED with 30 meq of potassium PO in the ED. Her GFR is mildly decreased which is mild and she has had these values before during previous visits. She has been given 1 mg Aitvan in the ED as her symptoms are concerning for having an anxiety component as well. The patients venous blood gas shows acidosis which is from her hyperventilating. Will continue to observe patient until she is no longer SOB and tachycardic.  Medications  albuterol (PROVENTIL) (2.5 MG/3ML) 0.083% nebulizer solution 5 mg (5 mg Nebulization Given 05/11/14 1645)  ipratropium (ATROVENT) nebulizer solution 0.5 mg (0.5 mg Nebulization Given 05/11/14 1645)  predniSONE (DELTASONE) tablet 60 mg (60 mg Oral Given 05/11/14 1644)  fentaNYL (SUBLIMAZE) injection 50 mcg (50 mcg Intravenous Given 05/11/14 1644)  ondansetron (ZOFRAN) injection 4 mg (4 mg Intravenous Given 05/11/14 1644)  potassium chloride SA (K-DUR,KLOR-CON) CR tablet 30 mEq (30 mEq Oral Given 05/11/14 1857)  LORazepam (ATIVAN) tablet  1 mg (1 mg Oral Given 05/11/14 1830)  albuterol (PROVENTIL) (2.5 MG/3ML) 0.083% nebulizer solution 5 mg (5 mg Nebulization Given 05/11/14 1830)  ipratropium (ATROVENT) nebulizer solution 0.5 mg (0.5 mg Nebulization Given 05/11/14 1830)    8:21 pm Patient is not acidotic as shown on the VBG, her O2 is elevated which is concerning for mixed venous sample. Reviewed VBGwith Dr. Eulis Foster who has also seen patient  and feels that she is safe for home. Clinically, the patient feels much better and has been observed for a few hours in the ED. No longer having any SOB.   Will give small steroid dose, have advised to watch glucose closely. She says she has albuterol at home and dose not need more. Will also Rx Flonase.  48 y.o.Emily Mathis's evaluation in the Emergency Department is complete. It has been determined that no acute conditions requiring further emergency intervention are present at this time. The patient/guardian have been advised of the diagnosis and plan. We have discussed signs and symptoms that warrant return to the ED, such as changes or worsening in symptoms.  Vital signs are stable at discharge. Filed Vitals:   05/11/14 2115  BP: 120/74  Pulse: 105  Temp:   Resp: 25    Patient/guardian has voiced understanding and agreed to follow-up with the PCP or specialist.   Linus Mako, PA-C 05/12/14 0101

## 2014-05-11 NOTE — ED Notes (Signed)
Lab results given to Nurse to report to Dr.Delo.

## 2014-05-17 ENCOUNTER — Ambulatory Visit (INDEPENDENT_AMBULATORY_CARE_PROVIDER_SITE_OTHER): Payer: BC Managed Care – PPO | Admitting: Physician Assistant

## 2014-05-17 ENCOUNTER — Telehealth: Payer: Self-pay | Admitting: Family Medicine

## 2014-05-17 VITALS — BP 120/70 | HR 108 | Temp 98.1°F | Resp 24 | Ht 63.0 in | Wt 270.4 lb

## 2014-05-17 DIAGNOSIS — F411 Generalized anxiety disorder: Secondary | ICD-10-CM

## 2014-05-17 DIAGNOSIS — R0602 Shortness of breath: Secondary | ICD-10-CM

## 2014-05-17 DIAGNOSIS — R059 Cough, unspecified: Secondary | ICD-10-CM

## 2014-05-17 DIAGNOSIS — R05 Cough: Secondary | ICD-10-CM

## 2014-05-17 MED ORDER — AMOXICILLIN 875 MG PO TABS: 875.0000 mg | ORAL_TABLET | Freq: Two times a day (BID) | ORAL | Status: AC

## 2014-05-17 MED ORDER — HYDROCOD POLST-CPM POLST ER 5-4 MG PO CP12: ORAL_CAPSULE | ORAL | Status: DC

## 2014-05-17 MED ORDER — ALPRAZOLAM 0.5 MG PO TABS: 0.5000 mg | ORAL_TABLET | Freq: Every evening | ORAL | Status: DC | PRN

## 2014-05-17 NOTE — Progress Notes (Signed)
Subjective:    Patient ID: Emily Mathis, female    DOB: [birth year], 48 y.o.   MRN: [id number]   PCP: Sheela Stack, MD  Chief Complaint  Patient presents with  . Asthma  . Cough  . Emesis    Medications, allergies, past medical history, surgical history, family history, social history and problem list reviewed and updated.  Patient Active Problem List   Diagnosis Date Noted  . Diabetes mellitus type 2, uncomplicated 32/20/2542  . Keloid scar, cervical incision 09/02/2013  . Reaction, situational, acute, to stress 07/02/2013  . 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 Severe obesity (BMI >= 40) 03/27/2013  . GERD (gastroesophageal reflux disease) 02/15/2013  . Hypocalcemia 01/07/2013  . Chest pain on exertion 07/24/2011    Prior to Admission medications   Medication Sig Start Date End Date Taking? Authorizing Provider  albuterol (PROVENTIL HFA;VENTOLIN HFA) 108 (90 BASE) MCG/ACT inhaler Inhale 2 puffs into the lungs daily as needed. For shortness of breath   Yes Historical Provider, MD  ALPRAZolam Duanne Moron) 0.5 MG tablet Take 0.5 mg by mouth at bedtime as needed for sleep.   Yes Historical Provider, MD  beclomethasone (QVAR) 80 MCG/ACT inhaler Inhale 2 puffs into the lungs 2 (two) times daily.   Yes Historical Provider, MD  chlorthalidone (HYGROTON) 25 MG tablet Take 25 mg by mouth daily.   Yes Historical Provider, MD  fluticasone (FLONASE) 50 MCG/ACT nasal spray Place 1 spray into both nostrils daily. 05/11/14  Yes Tiffany Marilu Favre, PA-C  HYDROcodone-acetaminophen (NORCO/VICODIN) 5-325 MG per tablet Take 1-2 tablets by mouth every 4 (four) hours as needed for moderate pain.   Yes Historical Provider, MD  levothyroxine (SYNTHROID, LEVOTHROID) 175 MCG tablet Take 175 mcg by mouth daily before breakfast.   Yes Historical Provider, MD  meloxicam (MOBIC) 15 MG tablet Take 15 mg by mouth as needed.    Yes Historical Provider, MD  metFORMIN (GLUCOPHAGE) 500 MG tablet Take 500  mg by mouth daily with breakfast.   Yes Historical Provider, MD  pantoprazole (PROTONIX) 40 MG tablet Take 40 mg by mouth daily.   Yes Historical Provider, MD  simethicone (MYLICON) 80 MG chewable tablet Chew 80 mg by mouth every 6 (six) hours as needed for flatulence.   Yes Historical Provider, MD  triamcinolone cream (KENALOG) 0.1 % Apply 1 application topically 2 (two) times daily.   Yes Historical Provider, MD  ondansetron (ZOFRAN-ODT) 8 MG disintegrating tablet  03/11/14   Historical Provider, MD    HPI  Presents for ED follow-up.  She presented to the ED on 05/11/2014 with SOB not relieved by albuterol inhaler.  She reports 6 weeks of cough, with phlegm stuck in her throat that makes her feels like she's choking.  She wakes from sleep coughing, gags and vomits, and feels like she cannot breathe.  This is different from her typical dyspnea associated with asthma and anxiety-she feels no chest tightness or pressure and has no wheezing. Her cough is triggered by smells-cologne, smoke, etc.  In the ED she received several nebulized albuterol treatments and Prednisone.  She continues on the Flonase prescribed there for complaint of some nasal congestion.  She has a long history of asthma, which for some time was uncontrolled. For the past 12 months it's been well controlled, but she's had episodes of SOB and chest pain triggered by anxiety.    She's seeing Dr. Forde Dandy for management of DM and hypothyroidism. She notes that she's not checking her BS at home because  she can't afford the test strips.  Review of Systems As above.  No sore throat, ear pressure or fullness. No nausea.  Vomiting only with coughing.    Objective:   Physical Exam  Vitals reviewed. Constitutional: She is oriented to person, place, and time. Vital signs are normal. She appears well-developed and well-nourished. She is active and cooperative. No distress.  BP 120/70  Pulse 108  Temp(Src) 98.1 F (36.7 C) (Oral)  Resp  24  Ht 5\' 3"  (1.6 m)  Wt 270 lb 6 oz (122.641 kg)  BMI 47.91 kg/m2  SpO2 99%  LMP 03/11/2013   HENT:  Head: Normocephalic and atraumatic.  Right Ear: Hearing, tympanic membrane, external ear and ear canal normal.  Left Ear: Hearing, tympanic membrane, external ear and ear canal normal.  Nose: Mucosal edema and rhinorrhea present.  No foreign bodies. Right sinus exhibits no maxillary sinus tenderness and no frontal sinus tenderness. Left sinus exhibits no maxillary sinus tenderness and no frontal sinus tenderness.  Mouth/Throat: Uvula is midline, oropharynx is clear and moist and mucous membranes are normal. No uvula swelling. No oropharyngeal exudate.  Eyes: Conjunctivae and EOM are normal. Pupils are equal, round, and reactive to light. Right eye exhibits no discharge. Left eye exhibits no discharge. No scleral icterus.  Neck: Trachea normal, normal range of motion and full passive range of motion without pain. Neck supple. No mass and no thyromegaly present.  Cardiovascular: Normal rate, regular rhythm and normal heart sounds.   Pulmonary/Chest: Effort normal and breath sounds normal.  Lymphadenopathy:       Head (right side): No submandibular, no tonsillar, no preauricular, no posterior auricular and no occipital adenopathy present.       Head (left side): No submandibular, no tonsillar, no preauricular and no occipital adenopathy present.    She has no cervical adenopathy.       Right: No supraclavicular adenopathy present.       Left: No supraclavicular adenopathy present.  Neurological: She is alert and oriented to person, place, and time. She has normal strength. No cranial nerve deficit or sensory deficit.  Skin: Skin is warm, dry and intact. No rash noted.  Psychiatric: She has a normal mood and affect. Her speech is normal and behavior is normal.          Assessment & Plan:  1. Cough 2. SOB (shortness of breath) I suspect subacute sinusitis. Continue Flonase and her other  routine medications. OTC Mucinex and 64 ounces of water daily. - Hydrocod Polst-Chlorphen Polst 5-4 MG CP12; 1-2 caps every 12 hours as needed for cough  Dispense: 30 each; Refill: 0 - amoxicillin (AMOXIL) 875 MG tablet; Take 1 tablet (875 mg total) by mouth 2 (two) times daily.  Dispense: 20 tablet; Refill: 0  3. Anxiety state, unspecified Controlled. - ALPRAZolam (XANAX) 0.5 MG tablet; Take 1 tablet (0.5 mg total) by mouth at bedtime as needed for sleep.  Dispense: 30 tablet; Refill: 0   Fara Chute, PA-C Physician Assistant-Certified [facility]

## 2014-05-17 NOTE — Patient Instructions (Signed)
Continue the medications you currently take. Make sure you are drinking lots of water (64 ounces each day). Use OTC Mucinex to thin the mucous.

## 2014-05-17 NOTE — Telephone Encounter (Signed)
[facility] called regarding hydocod Polst-Chlorphen 5/4mg . They do not have 5/4mg  they have 10/8mg . Gave them verbal to change to 10/8mg  and change directions to 1 cap instead of 1-2 on previous.

## 2014-06-04 ENCOUNTER — Ambulatory Visit (INDEPENDENT_AMBULATORY_CARE_PROVIDER_SITE_OTHER): Payer: BC Managed Care – PPO | Admitting: Physician Assistant

## 2014-06-04 VITALS — BP 140/88 | HR 106 | Temp 98.4°F | Resp 18 | Ht 63.25 in | Wt 271.0 lb

## 2014-06-04 DIAGNOSIS — R05 Cough: Secondary | ICD-10-CM

## 2014-06-04 DIAGNOSIS — R059 Cough, unspecified: Secondary | ICD-10-CM

## 2014-06-04 MED ORDER — PREDNISONE 20 MG PO TABS: ORAL_TABLET | ORAL | Status: DC

## 2014-06-04 NOTE — Progress Notes (Signed)
Subjective:    Patient ID: Emily Mathis, female    DOB: [birth year], 48 y.o.   MRN: [id number]   PCP: Alistair Senft, PA-C  Chief Complaint  Patient presents with  . Cough    on going x 8/31  . Shortness of Breath   Medications, allergies, past medical history, surgical history, family history, social history and problem list reviewed and updated.  Patient Active Problem List   Diagnosis Date Noted  . Diabetes mellitus type 2, uncomplicated 09/14/2013  . Keloid scar, cervical incision 09/02/2013  . Reaction, situational, acute, to stress 07/02/2013  . 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 Severe obesity (BMI >= 40) 03/27/2013  . GERD (gastroesophageal reflux disease) 02/15/2013  . Hypocalcemia 01/07/2013  . Chest pain on exertion 07/24/2011    Prior to Admission medications   Medication Sig Start Date End Date Taking? Authorizing Provider  albuterol (PROVENTIL HFA;VENTOLIN HFA) 108 (90 BASE) MCG/ACT inhaler Inhale 2 puffs into the lungs daily as needed. For shortness of breath   Yes Historical Provider, MD  ALPRAZolam (XANAX) 0.5 MG tablet Take 1 tablet (0.5 mg total) by mouth at bedtime as needed for sleep. 05/17/14  Yes Nasser Ku S Vona Whiters, PA-C  beclomethasone (QVAR) 80 MCG/ACT inhaler Inhale 2 puffs into the lungs 2 (two) times daily.   Yes Historical Provider, MD  chlorthalidone (HYGROTON) 25 MG tablet Take 25 mg by mouth daily.   Yes Historical Provider, MD  fluticasone (FLONASE) 50 MCG/ACT nasal spray Place 1 spray into both nostrils daily. 05/11/14  Yes Tiffany Irine Seal, PA-C  Hydrocod Polst-Chlorphen Polst 5-4 MG CP12 1-2 caps every 12 hours as needed for cough 05/17/14  Yes Zayin Valadez S Kahdijah Errickson, PA-C  HYDROcodone-acetaminophen (NORCO/VICODIN) 5-325 MG per tablet Take 1-2 tablets by mouth every 4 (four) hours as needed for moderate pain.   Yes Historical Provider, MD  levothyroxine (SYNTHROID, LEVOTHROID) 175 MCG tablet Take 175 mcg by mouth daily before breakfast.   Yes  Historical Provider, MD  meloxicam (MOBIC) 15 MG tablet Take 15 mg by mouth as needed.    Yes Historical Provider, MD  metFORMIN (GLUCOPHAGE) 500 MG tablet Take 500 mg by mouth daily with breakfast.   Yes Historical Provider, MD  ondansetron (ZOFRAN-ODT) 8 MG disintegrating tablet  03/11/14  Yes Historical Provider, MD  pantoprazole (PROTONIX) 40 MG tablet Take 40 mg by mouth daily.   Yes Historical Provider, MD  simethicone (MYLICON) 80 MG chewable tablet Chew 80 mg by mouth every 6 (six) hours as needed for flatulence.   Yes Historical Provider, MD  triamcinolone cream (KENALOG) 0.1 % Apply 1 application topically 2 (two) times daily.   Yes Historical Provider, MD     HPI  This 48 y.o. female presents for evaluation of persistent cough.  Cough is productive of mucous. Coughs until she can't catch her breath. Wakes from sleep feeling like she can't breathe.  CXR and labs in the ED on 8/25 were normal.  After she was seen here 05/17/2014, she was improving. She completed the amoxicillin and uses the hydrocodone at bedtime. 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  Had another episode like that today, so came in for additional evaluation.  Her head hurts. No nausea except when she coughs and gags. No sinus pressure or pain.  A little bit of a runny nose x 2 days.  Still pretty tired.  "I know it's from them try to get my  thyroid medication right." Fatigued all the time.  Review of Systems As above.    Objective:   Physical Exam  Vitals reviewed. Constitutional: She is oriented to person, place, and time. Vital signs are normal. She appears well-developed and well-nourished. She is active and cooperative. No distress.  BP 140/88  Pulse 106  Temp(Src) 98.4 F (36.9 C)  Resp 18  Ht 5' 3.25" (1.607 m)  Wt 271 lb (122.925 kg)  BMI 47.60 kg/m2  SpO2 98%  LMP 03/11/2013   HENT:  Head: Normocephalic and  atraumatic.  Right Ear: Hearing, tympanic membrane, external ear and ear canal normal.  Left Ear: Hearing, tympanic membrane, external ear and ear canal normal.  Nose: Mucosal edema and rhinorrhea present.  No foreign bodies. Right sinus exhibits no maxillary sinus tenderness and no frontal sinus tenderness. Left sinus exhibits no maxillary sinus tenderness and no frontal sinus tenderness.  Mouth/Throat: Uvula is midline, oropharynx is clear and moist and mucous membranes are normal. No uvula swelling. No oropharyngeal exudate.  Eyes: Conjunctivae and EOM are normal. Pupils are equal, round, and reactive to light. Right eye exhibits no discharge. Left eye exhibits no discharge. No scleral icterus.  Neck: Trachea normal, normal range of motion and full passive range of motion without pain. Neck supple. No mass and no thyromegaly present.  Cardiovascular: Normal rate, regular rhythm and normal heart sounds.   Pulmonary/Chest: Effort normal and breath sounds normal.  Lymphadenopathy:       Head (right side): No submandibular, no tonsillar, no preauricular, no posterior auricular and no occipital adenopathy present.       Head (left side): No submandibular, no tonsillar, no preauricular and no occipital adenopathy present.    She has no cervical adenopathy.       Right: No supraclavicular adenopathy present.       Left: No supraclavicular adenopathy present.  Neurological: She is alert and oriented to person, place, and time. She has normal strength. No cranial nerve deficit or sensory deficit.  Skin: Skin is warm, dry and intact. No rash noted.  Psychiatric: She has a normal mood and affect. Her speech is normal and behavior is normal.          Assessment & Plan:  1. Cough If no improvement in the next 48-72 hours, will add Azithromycin, though I do not believe that she has infection at this point.  Tdap is current, so pertussis is less likely.  Also consider GERD as cause, but this really  seems like an upper airway issue presently. She will increase her home glucose monitoring and let me know if she's running >200.  Increase water and exercise. She has hydrocodone tabs at home to take at [facility]. - predniSONE (DELTASONE) 20 MG tablet; Take 3 PO QAM x3days, 2 PO QAM x3days, 1 PO QAM x3days  Dispense: 18 tablet; Refill: 0   Fernande Bras, PA-C Physician Assistant-Certified [facility]

## 2014-06-04 NOTE — Patient Instructions (Signed)
We want your fasting blood sugar to be less than 130-140. We want your post-prandial blood sugar (2-3 hours after your largest meal) to be less than 170-180. The prednisone causes the blood sugar to rise. Make sure you are getting plenty of water to drink, and getting exercise each day (even walking can make a difference). If your sugars are running above 200, let me know-we may need to add something to lower it temporarily, while you're on the prednisone.

## 2014-07-02 ENCOUNTER — Ambulatory Visit (INDEPENDENT_AMBULATORY_CARE_PROVIDER_SITE_OTHER): Payer: BC Managed Care – PPO | Admitting: Physician Assistant

## 2014-07-02 VITALS — BP 126/86 | HR 98 | Temp 98.4°F | Resp 18 | Ht 64.0 in | Wt 267.0 lb

## 2014-07-02 DIAGNOSIS — E119 Type 2 diabetes mellitus without complications: Secondary | ICD-10-CM

## 2014-07-02 DIAGNOSIS — R05 Cough: Secondary | ICD-10-CM

## 2014-07-02 DIAGNOSIS — R059 Cough, unspecified: Secondary | ICD-10-CM

## 2014-07-02 DIAGNOSIS — M62838 Other muscle spasm: Secondary | ICD-10-CM

## 2014-07-02 DIAGNOSIS — R0602 Shortness of breath: Secondary | ICD-10-CM

## 2014-07-02 MED ORDER — CYCLOBENZAPRINE HCL 10 MG PO TABS: 10.0000 mg | ORAL_TABLET | Freq: Three times a day (TID) | ORAL | Status: DC | PRN

## 2014-07-02 MED ORDER — PANTOPRAZOLE SODIUM 40 MG PO TBEC
40.0000 mg | DELAYED_RELEASE_TABLET | Freq: Two times a day (BID) | ORAL | Status: DC
Start: 2014-07-02 — End: 2014-09-15

## 2014-07-02 MED ORDER — METFORMIN HCL 500 MG PO TABS: 500.0000 mg | ORAL_TABLET | Freq: Every day | ORAL | Status: DC

## 2014-07-02 NOTE — Progress Notes (Signed)
Subjective:    Patient ID: Pricilla Borowy, female    DOB: 02-08-1966, 48 y.o.   MRN: [id number]   PCP: Inita Uram, PA-C  Chief Complaint  Patient presents with  . Follow-up    cough  . Mass    right arm, x 2 weeks    Medications, allergies, past medical history, surgical history, family history, social history and problem list reviewed and updated.  Patient Active Problem List   Diagnosis Date Noted  . Diabetes mellitus type 2, uncomplicated 09/14/2013  . Keloid scar, cervical incision 09/02/2013  . Reaction, situational, acute, to stress 07/02/2013  . 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 Severe obesity (BMI >= 40) 03/27/2013  . GERD (gastroesophageal reflux disease) 02/15/2013  . Hypocalcemia 01/07/2013  . Chest pain on exertion 07/24/2011    Prior to Admission medications   Medication Sig Start Date End Date Taking? Authorizing Provider  albuterol (PROVENTIL HFA;VENTOLIN HFA) 108 (90 BASE) MCG/ACT inhaler Inhale 2 puffs into the lungs daily as needed. For shortness of breath   Yes Historical Provider, MD  ALPRAZolam (XANAX) 0.5 MG tablet Take 1 tablet (0.5 mg total) by mouth at bedtime as needed for sleep. 05/17/14  Yes Sophiana Milanese S Karilynn Carranza, PA-C  beclomethasone (QVAR) 80 MCG/ACT inhaler Inhale 2 puffs into the lungs 2 (two) times daily.   Yes Historical Provider, MD  chlorthalidone (HYGROTON) 25 MG tablet Take 25 mg by mouth daily.   Yes Historical Provider, MD  levothyroxine (SYNTHROID, LEVOTHROID) 175 MCG tablet Take 175 mcg by mouth daily before breakfast.   Yes Historical Provider, MD  meloxicam (MOBIC) 15 MG tablet Take 15 mg by mouth as needed.    Yes Historical Provider, MD  metFORMIN (GLUCOPHAGE) 500 MG tablet Take 500 mg by mouth daily with breakfast.   Yes Historical Provider, MD  ondansetron (ZOFRAN-ODT) 8 MG disintegrating tablet  03/11/14  Yes Historical Provider, MD  pantoprazole (PROTONIX) 40 MG tablet Take 40 mg by mouth daily.   Yes Historical  Provider, MD  simethicone (MYLICON) 80 MG chewable tablet Chew 80 mg by mouth every 6 (six) hours as needed for flatulence.   Yes Historical Provider, MD  triamcinolone cream (KENALOG) 0.1 % Apply 1 application topically 2 (two) times daily.   Yes Historical Provider, MD    HPI  This 48 y.o. female presents for evaluation of persistent cough.  Symptoms x several months.  See my previous notes. Initially, thought due to asthma and triggered by stress/anxiety. Then she developed what seemed to be a subacute sinusitis and was treated with amoxicillin and then a prednisone taper.  The URI-type symptoms are completely resolved, but she still has cough. She's using the fluticasone nasal spray, QVAR and prn albuterol. It is of note that she also takes pantoprazole for GERD, which she describes as well controlled.  Still wakes her from sleep feeling like she can't breathe. It's scaring her. "I don't know what's going on. Why." Feels like something is stuck in her throat, like mucous. Constantly clearing her throat.  Pain in the LEFT neck and arm x 2 weeks. "Soreness." Found a small lump just below the LEFT shoulder while rubbing the tender muscles, and became concerned that this was the cause. Reports the lump was bigger, and while it hasn't drained anything, it's now smaller than initially.  Tolerating metformin. Feels itchy and sweaty when her glucose rises.  Improving with cmpletion of prednisone. Checking her home glucose QID since starting prednisone.   Review of Systems As above.  Objective:   Physical Exam  Vitals reviewed. Constitutional: She is oriented to person, place, and time. She appears well-developed and well-nourished. No distress.  Eyes: Conjunctivae are normal. No scleral icterus.  Neck: No thyromegaly present.  Cardiovascular: Normal rate, regular rhythm, normal heart sounds and intact distal pulses.   Pulmonary/Chest: Effort normal and breath sounds normal.    Musculoskeletal:       Cervical back: She exhibits tenderness. She exhibits normal range of motion and no bony tenderness.       Back:       Arms: Lymphadenopathy:    She has no cervical adenopathy.  Neurological: She is alert and oriented to person, place, and time.  Skin: Skin is warm and dry.  Psychiatric: She has a normal mood and affect. Her behavior is normal.          Assessment & Plan:  1. Cough Now suspect LPR. Increase pantoprazole to BID. Refer to ENT. - pantoprazole (PROTONIX) 40 MG tablet; Take 1 tablet (40 mg total) by mouth 2 (two) times daily.  Dispense: 60 tablet; Refill: 1 - Ambulatory referral to ENT  2. SOB (shortness of breath) Now only with coughing jags that make her afraid that she won't catch her breath. See above. Continue QVAR and prn albuterol.  3. Muscle spasm She has meloxicam at home. Warm compresses. Flexeril.  - cyclobenzaprine (FLEXERIL) 10 MG tablet; Take 1 tablet (10 mg total) by mouth 3 (three) times daily as needed for muscle spasms.  Dispense: 30 tablet; Refill: 0  4. Diabetes mellitus type 2, uncomplicated Hold off on A1C given recent elevations in glucose due to prednisone. Continue efforts for healthy eating, regular exercise and weight loss.  Congratulated on her efforts thus far. - metFORMIN (GLUCOPHAGE) 500 MG tablet; Take 1 tablet (500 mg total) by mouth daily with breakfast.  Dispense: 90 tablet; Refill: 1   Fernande Bras, PA-C Physician Assistant-Certified [facility]

## 2014-07-02 NOTE — Patient Instructions (Signed)
Take the meloxicam once a day. Take the cyclobenzaprine at bedtime. Use a warm compress to the neck to help reduce the spasm there. Increase the pantoprazole (Protonix) to twice daily.

## 2014-07-16 ENCOUNTER — Other Ambulatory Visit: Payer: Self-pay | Admitting: Physician Assistant

## 2014-07-16 DIAGNOSIS — M62838 Other muscle spasm: Secondary | ICD-10-CM

## 2014-07-16 DIAGNOSIS — F411 Generalized anxiety disorder: Secondary | ICD-10-CM

## 2014-07-17 MED ORDER — ALPRAZOLAM 0.5 MG PO TABS: 0.5000 mg | ORAL_TABLET | Freq: Every evening | ORAL | Status: DC | PRN

## 2014-07-17 MED ORDER — CYCLOBENZAPRINE HCL 10 MG PO TABS: 10.0000 mg | ORAL_TABLET | Freq: Three times a day (TID) | ORAL | Status: DC | PRN

## 2014-07-17 NOTE — Telephone Encounter (Signed)
Faxed

## 2014-08-25 ENCOUNTER — Encounter (HOSPITAL_COMMUNITY): Payer: Self-pay | Admitting: Cardiology

## 2014-09-15 ENCOUNTER — Other Ambulatory Visit: Payer: Self-pay | Admitting: Physician Assistant

## 2014-10-06 ENCOUNTER — Other Ambulatory Visit: Payer: Self-pay | Admitting: Physician Assistant

## 2014-10-06 DIAGNOSIS — F411 Generalized anxiety disorder: Secondary | ICD-10-CM

## 2014-10-06 MED ORDER — ALPRAZOLAM 0.5 MG PO TABS: 0.5000 mg | ORAL_TABLET | Freq: Every evening | ORAL | Status: DC | PRN

## 2014-10-06 NOTE — Telephone Encounter (Signed)
Faxed. Notified pt on MyChart.

## 2014-10-18 ENCOUNTER — Ambulatory Visit (INDEPENDENT_AMBULATORY_CARE_PROVIDER_SITE_OTHER): Payer: 59 | Admitting: Physician Assistant

## 2014-10-18 VITALS — BP 138/82 | HR 118 | Temp 98.4°F | Resp 16 | Ht 63.0 in | Wt 275.0 lb

## 2014-10-18 DIAGNOSIS — E119 Type 2 diabetes mellitus without complications: Secondary | ICD-10-CM

## 2014-10-18 DIAGNOSIS — R000 Tachycardia, unspecified: Secondary | ICD-10-CM | POA: Diagnosis not present

## 2014-10-18 DIAGNOSIS — I10 Essential (primary) hypertension: Secondary | ICD-10-CM | POA: Diagnosis not present

## 2014-10-18 DIAGNOSIS — E890 Postprocedural hypothyroidism: Secondary | ICD-10-CM

## 2014-10-18 DIAGNOSIS — K219 Gastro-esophageal reflux disease without esophagitis: Secondary | ICD-10-CM

## 2014-10-18 DIAGNOSIS — R5383 Other fatigue: Secondary | ICD-10-CM | POA: Diagnosis not present

## 2014-10-18 DIAGNOSIS — F411 Generalized anxiety disorder: Secondary | ICD-10-CM | POA: Diagnosis not present

## 2014-10-18 DIAGNOSIS — Z23 Encounter for immunization: Secondary | ICD-10-CM

## 2014-10-18 DIAGNOSIS — M62838 Other muscle spasm: Secondary | ICD-10-CM | POA: Diagnosis not present

## 2014-10-18 DIAGNOSIS — E876 Hypokalemia: Secondary | ICD-10-CM | POA: Diagnosis not present

## 2014-10-18 LAB — POCT CBC
Granulocyte percent: 56.3 %G (ref 37–80)
HCT, POC: 39.9 % (ref 37.7–47.9)
HEMOGLOBIN: 12.1 g/dL — AB (ref 12.2–16.2)
Lymph, poc: 4 — AB (ref 0.6–3.4)
MCH, POC: 27.2 pg (ref 27–31.2)
MCHC: 30.3 g/dL — AB (ref 31.8–35.4)
MCV: 89.6 fL (ref 80–97)
MID (CBC): 0.3 (ref 0–0.9)
MPV: 8.1 fL (ref 0–99.8)
POC GRANULOCYTE: 5.6 (ref 2–6.9)
POC LYMPH PERCENT: 40.4 %L (ref 10–50)
POC MID %: 3.3 % (ref 0–12)
Platelet Count, POC: 497 10*3/uL — AB (ref 142–424)
RBC: 4.45 M/uL (ref 4.04–5.48)
RDW, POC: 18.7 %
WBC: 9.9 10*3/uL (ref 4.6–10.2)

## 2014-10-18 LAB — POCT GLYCOSYLATED HEMOGLOBIN (HGB A1C): HEMOGLOBIN A1C: 6.6

## 2014-10-18 LAB — GLUCOSE, POCT (MANUAL RESULT ENTRY): POC Glucose: 123 mg/dl — AB (ref 70–99)

## 2014-10-18 MED ORDER — ALPRAZOLAM 0.5 MG PO TABS: 0.5000 mg | ORAL_TABLET | Freq: Every evening | ORAL | Status: DC | PRN

## 2014-10-18 MED ORDER — PANTOPRAZOLE SODIUM 40 MG PO TBEC: DELAYED_RELEASE_TABLET | ORAL | Status: DC

## 2014-10-18 MED ORDER — MELOXICAM 15 MG PO TABS: 15.0000 mg | ORAL_TABLET | ORAL | Status: DC | PRN

## 2014-10-18 MED ORDER — CYCLOBENZAPRINE HCL 10 MG PO TABS: 10.0000 mg | ORAL_TABLET | Freq: Three times a day (TID) | ORAL | Status: DC | PRN

## 2014-10-18 MED ORDER — CHLORTHALIDONE 25 MG PO TABS: 25.0000 mg | ORAL_TABLET | Freq: Every day | ORAL | Status: DC

## 2014-10-18 NOTE — Progress Notes (Signed)
Subjective:    Patient ID: Emily Mathis, female    DOB: May 18, 1966, 49 y.o.   MRN: [id number]   PCP: Fatiha Guzy, PA-C  Chief Complaint  Patient presents with  . Fatigue  . Cough  . Follow-up    diabetic    Allergies  Allergen Reactions  . Latex     Itchy & leaves marks on skin  . Dilaudid [Hydromorphone Hcl] Itching  . Hydrocodone Itching    Patient Active Problem List   Diagnosis Date Noted  . Diabetes mellitus type 2, uncomplicated 09/14/2013  . Keloid scar, cervical incision 09/02/2013  . Reaction, situational, acute, to stress 07/02/2013  . 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 Severe obesity (BMI >= 40) 03/27/2013  . GERD (gastroesophageal reflux disease) 02/15/2013  . Hypocalcemia 01/07/2013  . Chest pain on exertion 07/24/2011    Prior to Admission medications   Medication Sig Start Date End Date Taking? Authorizing Provider  albuterol (PROVENTIL HFA;VENTOLIN HFA) 108 (90 BASE) MCG/ACT inhaler Inhale 2 puffs into the lungs daily as needed. For shortness of breath   Yes Historical Provider, MD  ALPRAZolam (XANAX) 0.5 MG tablet Take 1 tablet (0.5 mg total) by mouth at bedtime as needed for sleep. 10/06/14  Yes Raphael Espe S Magon Croson, PA-C  beclomethasone (QVAR) 80 MCG/ACT inhaler Inhale 2 puffs into the lungs 2 (two) times daily.   Yes Historical Provider, MD  chlorthalidone (HYGROTON) 25 MG tablet Take 25 mg by mouth daily.   Yes Historical Provider, MD  cyclobenzaprine (FLEXERIL) 10 MG tablet Take 1 tablet (10 mg total) by mouth 3 (three) times daily as needed for muscle spasms. 07/17/14  Yes Lars Jeziorski S Duane Trias, PA-C  levothyroxine (SYNTHROID, LEVOTHROID) 125 MCG tablet Take 250 mcg by mouth daily. 09/15/14  Yes Historical Provider, MD  meloxicam (MOBIC) 15 MG tablet Take 15 mg by mouth as needed.    Yes Historical Provider, MD  metFORMIN (GLUCOPHAGE) 500 MG tablet Take 1 tablet (500 mg total) by mouth daily with breakfast. 07/02/14  Yes Jenice Leiner S Antinette Keough, PA-C    pantoprazole (PROTONIX) 40 MG tablet TAKE 1 TABLET (40 MG TOTAL) BY MOUTH 2 (TWO) TIMES DAILY. 09/17/14  Yes Fernande Bras, PA-C    Medical, Surgical, Family and Social History reviewed and updated.  HPI  1. DM Frequency of home glucose monitoring: tries to do it twice each day (left her glucometer at home today). Fasting 120's-300's Doesn't see a dentist Q6 months or an eye specialist annually. "but I know that I need to, 'cause it's messing with my eyes. Checks feet daily. Is not current with influenza vaccine. Is current with pneumococcal vaccine. Needs booster at age 52.  2. Thyroid. Last TSH result available to me in 12/14, when it was 20.910. Followed by Dr. Evlyn Kanner, but hasn't followed up recently due to financial strain. Last visit was 03/31/14. At that time, she increased to 175 mcg levothyroxine, but has since increased to 250 mcg (takes 2 of the 125 mcg tablets) with Dr. Gerrit Friends.   3. Cough.  The chronic cough resolved with regular use of pantoprazole and Qvar inhaler. Has a little bit of a cough now associated with a cold that began 1/28.  4. Fatigue. "It's been a minute. For a while now. I just don't have any energy. I just feel bad."  5. Stress. Better over all. Still some bad days. No more episodes of waking in the middle of the night with SOB.  Review of Systems As above. No CP, SOB, HA,  Dizziness, GI/GU symptoms. No new muscle or joint pain.    Objective:   Physical Exam  Constitutional: She is oriented to person, place, and time. She appears well-developed and well-nourished. No distress.  BP 138/82 mmHg  Pulse 118  Temp(Src) 98.4 F (36.9 C) (Oral)  Resp 16  Ht 5\' 3"  (1.6 m)  Wt 275 lb (124.739 kg)  BMI 48.73 kg/m2  SpO2 98%  LMP 09/17/2014   HENT:  Head: Normocephalic and atraumatic.  Right Ear: Hearing, tympanic membrane, external ear and ear canal normal.  Left Ear: Hearing, tympanic membrane, external ear and ear canal normal.  Nose: Nose normal.   Mouth/Throat: Uvula is midline and oropharynx is clear and moist.  Eyes: Conjunctivae and EOM are normal. Pupils are equal, round, and reactive to light. No scleral icterus.  Neck: Normal range of motion. Neck supple. No thyromegaly (surgically removed; keloid scar is considerably improved with recent steroid cream use) present.  Cardiovascular: Normal rate, regular rhythm, normal heart sounds and intact distal pulses.   Pulmonary/Chest: Effort normal and breath sounds normal.  Lymphadenopathy:    She has no cervical adenopathy.  Neurological: She is alert and oriented to person, place, and time.  Skin: Skin is warm and dry.  Psychiatric: She has a normal mood and affect. Her behavior is normal.     Diabetic Foot Exam - Simple   Simple Foot Form  Diabetic Foot exam was performed with the following findings:  Yes 10/18/2014  6:15 PM  Visual Inspection  No deformities, no ulcerations, no other skin breakdown bilaterally:  Yes  Sensation Testing  See comments:  Yes  Pulse Check  Posterior Tibialis and Dorsalis pulse intact bilaterally:  Yes  Comments  Unable to feel monofilament on the heels bilaterally.     Results for orders placed or performed in visit on 10/18/14  POCT CBC  Result Value Ref Range   WBC 9.9 4.6 - 10.2 K/uL   Lymph, poc 4.0 (A) 0.6 - 3.4   POC LYMPH PERCENT 40.4 10 - 50 %L   MID (cbc) 0.3 0 - 0.9   POC MID % 3.3 0 - 12 %M   POC Granulocyte 5.6 2 - 6.9   Granulocyte percent 56.3 37 - 80 %G   RBC 4.45 4.04 - 5.48 M/uL   Hemoglobin 12.1 (A) 12.2 - 16.2 g/dL   HCT, POC 16.1 09.6 - 47.9 %   MCV 89.6 80 - 97 fL   MCH, POC 27.2 27 - 31.2 pg   MCHC 30.3 (A) 31.8 - 35.4 g/dL   RDW, POC 04.5 %   Platelet Count, POC 497 (A) 142 - 424 K/uL   MPV 8.1 0 - 99.8 fL  POCT glucose (manual entry)  Result Value Ref Range   POC Glucose 123 (A) 70 - 99 mg/dl  POCT glycosylated hemoglobin (Hb A1C)  Result Value Ref Range   Hemoglobin A1C 6.6         Assessment & Plan:   1. Diabetes mellitus type 2, uncomplicated Controlled. Continue her efforts for weight loss with healthy eating and regular exercise. Continue Metformin as before. - POCT glucose (manual entry) - POCT glycosylated hemoglobin (Hb A1C) - Microalbumin, urine - Comprehensive metabolic panel - Lipid panel  2. Hypothyroidism, postsurgical Await lab results. Will adjust medication if indicated. Encouraged her to call Dr. Rinaldo Cloud office to reschedule. - TSH  3. Severe obesity (BMI >= 40) Healthy eating. At least 150 minutes of exercise each week.  4. Gastroesophageal  reflux disease, esophagitis presence not specified Continue current treatment. Weight loss will help. - pantoprazole (PROTONIX) 40 MG tablet; TAKE 1 TABLET (40 MG TOTAL) BY MOUTH 2 (TWO) TIMES DAILY.  Dispense: 180 tablet; Refill: 3  5. Other fatigue Suspect this is due to sub-therapeutic levothyroxine dose. Await lab. - POCT CBC - Vitamin D 1,25 dihydroxy  6. Tachycardia Resolved with rest and relaxation. See anxiety, below.  7. Need for influenza vaccination 8. Encounter for immunization - Flu Vaccine QUAD 36+ mos IM   9. Muscle spasm Stable. Continue PRN use of meloxicam and cyclobenzaprine. - meloxicam (MOBIC) 15 MG tablet; Take 1 tablet (15 mg total) by mouth as needed.  Dispense: 90 tablet; Refill: 1 - cyclobenzaprine (FLEXERIL) 10 MG tablet; Take 1 tablet (10 mg total) by mouth 3 (three) times daily as needed for muscle spasms.  Dispense: 30 tablet; Refill: 3  10. GAD (generalized anxiety disorder) Stable. Continue PRN alprazolam. - ALPRAZolam (XANAX) 0.5 MG tablet; Take 1 tablet (0.5 mg total) by mouth at bedtime as needed for sleep.  Dispense: 30 tablet; Refill: 0  11. Essential hypertension Controlled. Continue current treatment. - chlorthalidone (HYGROTON) 25 MG tablet; Take 1 tablet (25 mg total) by mouth daily.  Dispense: 90 tablet; Refill: 3   Fernande Bras, PA-C Physician  Assistant-Certified [facility]

## 2014-10-18 NOTE — Patient Instructions (Signed)
I will contact you with your lab results as soon as they are available.   If you have not heard from me in 2 weeks, please contact me.  The fastest way to get your results is to register for My Chart (see the instructions on the last page of this printout).  Call Dr. Baldwin Crown office to reschedule there. As soon as you are able, be sure to schedule a visit with your dentist and eye specialist.

## 2014-10-19 LAB — LIPID PANEL
CHOL/HDL RATIO: 3.1 ratio
CHOLESTEROL: 159 mg/dL (ref 0–200)
HDL: 52 mg/dL (ref >39)
LDL CALC: 91 mg/dL (ref 0–99)
TRIGLYCERIDES: 81 mg/dL (ref <150)
VLDL: 16 mg/dL (ref 0–40)

## 2014-10-19 LAB — COMPREHENSIVE METABOLIC PANEL
ALK PHOS: 73 U/L (ref 39–117)
ALT: 20 U/L (ref 0–35)
AST: 21 U/L (ref 0–37)
Albumin: 3.9 g/dL (ref 3.5–5.2)
BUN: 17 mg/dL (ref 6–23)
CALCIUM: 8.9 mg/dL (ref 8.4–10.5)
CO2: 30 meq/L (ref 19–32)
CREATININE: 0.92 mg/dL (ref 0.50–1.10)
Chloride: 97 mEq/L (ref 96–112)
GLUCOSE: 111 mg/dL — AB (ref 70–99)
POTASSIUM: 3.3 meq/L — AB (ref 3.5–5.3)
Sodium: 137 mEq/L (ref 135–145)
TOTAL PROTEIN: 8.5 g/dL — AB (ref 6.0–8.3)
Total Bilirubin: 0.4 mg/dL (ref 0.2–1.2)

## 2014-10-19 LAB — TSH: TSH: 1.778 u[IU]/mL (ref 0.350–4.500)

## 2014-10-20 LAB — MICROALBUMIN, URINE: Microalb, Ur: 1.7 mg/dL (ref <2.0)

## 2014-10-21 ENCOUNTER — Other Ambulatory Visit: Payer: Self-pay | Admitting: Physician Assistant

## 2014-10-21 LAB — VITAMIN D 1,25 DIHYDROXY
VITAMIN D3 1, 25 (OH): 19 pg/mL
Vitamin D 1, 25 (OH)2 Total: 19 pg/mL (ref 18–72)
Vitamin D2 1, 25 (OH)2: <8 pg/mL

## 2014-10-23 NOTE — Addendum Note (Signed)
Addended by: Fara Chute on: 10/23/2014 09:08 AM   Modules accepted: Orders

## 2014-11-08 ENCOUNTER — Encounter: Payer: Self-pay | Admitting: Physician Assistant

## 2014-11-08 DIAGNOSIS — E890 Postprocedural hypothyroidism: Secondary | ICD-10-CM

## 2014-11-25 ENCOUNTER — Other Ambulatory Visit: Payer: Self-pay | Admitting: Physician Assistant

## 2014-12-20 ENCOUNTER — Encounter (HOSPITAL_COMMUNITY): Payer: Self-pay | Admitting: Emergency Medicine

## 2014-12-20 DIAGNOSIS — Z791 Long term (current) use of non-steroidal anti-inflammatories (NSAID): Secondary | ICD-10-CM | POA: Insufficient documentation

## 2014-12-20 DIAGNOSIS — M1990 Unspecified osteoarthritis, unspecified site: Secondary | ICD-10-CM | POA: Insufficient documentation

## 2014-12-20 DIAGNOSIS — K219 Gastro-esophageal reflux disease without esophagitis: Secondary | ICD-10-CM | POA: Insufficient documentation

## 2014-12-20 DIAGNOSIS — E1142 Type 2 diabetes mellitus with diabetic polyneuropathy: Secondary | ICD-10-CM | POA: Insufficient documentation

## 2014-12-20 DIAGNOSIS — Z7951 Long term (current) use of inhaled steroids: Secondary | ICD-10-CM | POA: Diagnosis not present

## 2014-12-20 DIAGNOSIS — J45909 Unspecified asthma, uncomplicated: Secondary | ICD-10-CM | POA: Diagnosis not present

## 2014-12-20 DIAGNOSIS — Z8543 Personal history of malignant neoplasm of ovary: Secondary | ICD-10-CM | POA: Diagnosis not present

## 2014-12-20 DIAGNOSIS — G5601 Carpal tunnel syndrome, right upper limb: Secondary | ICD-10-CM | POA: Insufficient documentation

## 2014-12-20 DIAGNOSIS — E079 Disorder of thyroid, unspecified: Secondary | ICD-10-CM | POA: Insufficient documentation

## 2014-12-20 DIAGNOSIS — I10 Essential (primary) hypertension: Secondary | ICD-10-CM | POA: Insufficient documentation

## 2014-12-20 DIAGNOSIS — G5602 Carpal tunnel syndrome, left upper limb: Secondary | ICD-10-CM | POA: Diagnosis not present

## 2014-12-20 DIAGNOSIS — F419 Anxiety disorder, unspecified: Secondary | ICD-10-CM | POA: Insufficient documentation

## 2014-12-20 DIAGNOSIS — Z91040 Latex allergy status: Secondary | ICD-10-CM | POA: Diagnosis not present

## 2014-12-20 DIAGNOSIS — E669 Obesity, unspecified: Secondary | ICD-10-CM | POA: Diagnosis not present

## 2014-12-20 DIAGNOSIS — Z79899 Other long term (current) drug therapy: Secondary | ICD-10-CM | POA: Insufficient documentation

## 2014-12-20 DIAGNOSIS — M549 Dorsalgia, unspecified: Secondary | ICD-10-CM | POA: Diagnosis present

## 2014-12-20 LAB — CBG MONITORING, ED: Glucose-Capillary: 132 mg/dL — ABNORMAL HIGH (ref 70–99)

## 2014-12-20 NOTE — ED Notes (Addendum)
Pt st's she started having pain in right side of neck going into right arm and hand and into lower back.  Pt also st's she has noticed bruising on left arm and feet with no injury   Pt also c/o excessive thrist

## 2014-12-21 ENCOUNTER — Emergency Department (HOSPITAL_COMMUNITY)
Admission: EM | Admit: 2014-12-21 | Discharge: 2014-12-21 | Disposition: A | Discharge status: Home / Self Care | Payer: 59 | Attending: Emergency Medicine | Admitting: Emergency Medicine

## 2014-12-21 DIAGNOSIS — E1142 Type 2 diabetes mellitus with diabetic polyneuropathy: Secondary | ICD-10-CM

## 2014-12-21 DIAGNOSIS — G5603 Carpal tunnel syndrome, bilateral upper limbs: Secondary | ICD-10-CM

## 2014-12-21 LAB — CBC WITH DIFFERENTIAL/PLATELET
Basophils Absolute: 0.1 10*3/uL (ref 0.0–0.1)
Basophils Relative: 1 % (ref 0–1)
Eosinophils Absolute: 0.2 10*3/uL (ref 0.0–0.7)
Eosinophils Relative: 2 % (ref 0–5)
HCT: 38.1 % (ref 36.0–46.0)
HEMOGLOBIN: 11.9 g/dL — AB (ref 12.0–15.0)
Lymphocytes Relative: 49 % — ABNORMAL HIGH (ref 12–46)
Lymphs Abs: 4.6 10*3/uL — ABNORMAL HIGH (ref 0.7–4.0)
MCH: 28.2 pg (ref 26.0–34.0)
MCHC: 31.2 g/dL (ref 30.0–36.0)
MCV: 90.3 fL (ref 78.0–100.0)
MONOS PCT: 5 % (ref 3–12)
Monocytes Absolute: 0.5 10*3/uL (ref 0.1–1.0)
Neutro Abs: 4 10*3/uL (ref 1.7–7.7)
Neutrophils Relative %: 43 % (ref 43–77)
Platelets: 441 10*3/uL — ABNORMAL HIGH (ref 150–400)
RBC: 4.22 MIL/uL (ref 3.87–5.11)
RDW: 16.6 % — AB (ref 11.5–15.5)
WBC: 9.4 10*3/uL (ref 4.0–10.5)

## 2014-12-21 LAB — I-STAT CHEM 8, ED
BUN: 20 mg/dL (ref 6–23)
CREATININE: 1 mg/dL (ref 0.50–1.10)
Calcium, Ion: 1.03 mmol/L — ABNORMAL LOW (ref 1.12–1.23)
Chloride: 101 mmol/L (ref 96–112)
Glucose, Bld: 110 mg/dL — ABNORMAL HIGH (ref 70–99)
HCT: 41 % (ref 36.0–46.0)
Hemoglobin: 13.9 g/dL (ref 12.0–15.0)
Potassium: 3.3 mmol/L — ABNORMAL LOW (ref 3.5–5.1)
Sodium: 141 mmol/L (ref 135–145)
TCO2: 26 mmol/L (ref 0–100)

## 2014-12-21 LAB — PROTIME-INR
INR: 1.02 (ref 0.00–1.49)
PROTHROMBIN TIME: 13.5 s (ref 11.6–15.2)

## 2014-12-21 LAB — APTT: aPTT: 30 seconds (ref 24–37)

## 2014-12-21 LAB — CK: Total CK: 2872 U/L — ABNORMAL HIGH (ref 7–177)

## 2014-12-21 MED ORDER — IBUPROFEN 800 MG PO TABS
800.0000 mg | ORAL_TABLET | Freq: Once | ORAL | Status: AC
  Administered 2014-12-21: 800 mg via ORAL
  Filled 2014-12-21: qty 1

## 2014-12-21 MED ORDER — SODIUM CHLORIDE 0.9 % IV BOLUS (SEPSIS): 1000.0000 mL | Freq: Once | INTRAVENOUS | Status: DC

## 2014-12-21 NOTE — ED Provider Notes (Signed)
CSN: [id number]     Arrival date & time 12/20/14  2121 History   None    This chart was scribed for Everlene Balls, MD by Forrestine Him, ED Scribe. This patient was seen in room A03C/A03C and the patient's care was started 12:23 AM.   Chief Complaint  Patient presents with  . Back Pain    The history is provided by the patient. No language interpreter was used.    HPI Comments: Emily Mathis is a 49 y.o. female with a PMHx of hyperlipidemia, asthma, thyroid disease, cancer-pre cancer ovarian, arthritis, HTN, anxiety, GERD, and DM who presents to the Emergency Department complaining of constant, ongoing, R sided neck pain that radiates down the R arm and into the back x 3 weeks. Pain is described as paresthesia.  She also report tingling to finger tips-L greater than R and in feet bilaterally. Feeling is exacerbated when using the hands without any alleviating factors. She has noted bruising to top of L foot and L upper arm. Pt denies any recent fever or chills. No recent injury or trauma. However she admits to using her fingers a lot as she types frequently at work. Pt with known allergies to Dilaudid and Hydrocodone.  Past Medical History  Diagnosis Date  . Hyperlipidemia   . Asthma   . Thyroid disease   . Arthritis   . Hypertension     per Dr. Irven Shelling note 08/30/2011  . Anxiety   . GERD (gastroesophageal reflux disease)   . Headache(784.0)   . Cancer     Pre-Cancer-Ovarian  . Diabetes mellitus without complication    Past Surgical History  Procedure Laterality Date  . Cesarean section  1995  . Cesarean section  1996  . Cardiac catheterization  2013  . Thyroidectomy N/A 12/18/2012    Procedure: TOTAL THYROIDECTOMY;  Surgeon: Earnstine Regal, MD;  Location: [facility] ORS;  Service: General;  Laterality: N/A;  . Left heart catheterization with coronary angiogram  07/24/2011    Procedure: LEFT HEART CATHETERIZATION WITH CORONARY ANGIOGRAM;  Surgeon: Laverda Page, MD;  Location: [facility] C[facility] LAB;   Service: Cardiovascular;;   Family History  Problem Relation Age of Onset  . Heart disease Mother   . Stroke Mother   . Hypertension Mother   . Heart disease Father   . Colon cancer Father   . Hypertension Father   . Breast cancer Paternal Aunt   . Cancer Paternal Grandmother   . Cancer Paternal Aunt   . Asthma Daughter   . Seizures Daughter   . Hypertension Sister   . Mental illness Daughter 46    suicide attempt 06/2013   History  Substance Use Topics  . Smoking status: Never Smoker   . Smokeless tobacco: Never Used  . Alcohol Use: Yes     Comment: Occasional   OB History    No data available     Review of Systems  Constitutional: Negative for fever and chills.  Musculoskeletal: Positive for back pain, arthralgias and neck pain.  Neurological: Positive for numbness. Negative for weakness.  All other systems reviewed and are negative.     Allergies  Latex; Dilaudid; and Hydrocodone  Home Medications   Prior to Admission medications   Medication Sig Start Date End Date Taking? Authorizing Provider  albuterol (PROVENTIL HFA;VENTOLIN HFA) 108 (90 BASE) MCG/ACT inhaler Inhale 2 puffs into the lungs daily as needed. For shortness of breath    Historical Provider, MD  ALPRAZolam Duanne Moron) 0.5 MG tablet  Take 1 tablet (0.5 mg total) by mouth at bedtime as needed for sleep. 10/18/14   [name] Janalee Dane, PA-C  beclomethasone (QVAR) 80 MCG/ACT inhaler Inhale 2 puffs into the lungs 2 (two) times daily.    Historical Provider, MD  chlorthalidone (HYGROTON) 25 MG tablet Take 1 tablet (25 mg total) by mouth daily. 10/18/14   [name] S Jeffery, PA-C  cyclobenzaprine (FLEXERIL) 10 MG tablet Take 1 tablet (10 mg total) by mouth 3 (three) times daily as needed for muscle spasms. 10/18/14   [name] Janalee Dane, PA-C  levothyroxine (SYNTHROID, LEVOTHROID) 125 MCG tablet Take 250 mcg by mouth daily. 09/15/14   Historical Provider, MD  meloxicam (MOBIC) 15 MG tablet Take 1 tablet (15 mg total) by  mouth as needed. 10/18/14   [name] Janalee Dane, PA-C  metFORMIN (GLUCOPHAGE) 500 MG tablet Take 1 tablet (500 mg total) by mouth daily with breakfast. 07/02/14   [name] S Jeffery, PA-C  pantoprazole (PROTONIX) 40 MG tablet TAKE 1 TABLET (40 MG TOTAL) BY MOUTH 2 (TWO) TIMES DAILY. 10/18/14   [name] S Jeffery, PA-C  QVAR 80 MCG/ACT inhaler INHALE 2 PUFFS INTO THE LUNGS 2 (TWO) TIMES DAILY. 11/25/14   [name] Janalee Dane, PA-C   Triage Vitals: BP 124/89 mmHg  Pulse 100  Temp(Src) 98.6 F (37 C)  Resp 18  Ht 5\' 3"  (1.6 m)  Wt 270 lb (122.471 kg)  BMI 47.84 kg/m2  SpO2 94%   Physical Exam  Constitutional: She is oriented to person, place, and time. She appears well-developed and well-nourished. No distress.  Obese female  HENT:  Head: Normocephalic and atraumatic.  Nose: Nose normal.  Mouth/Throat: Oropharynx is clear and moist. No oropharyngeal exudate.  Eyes: Conjunctivae and EOM are normal. Pupils are equal, round, and reactive to light. No scleral icterus.  Neck: Normal range of motion. Neck supple. No JVD present. No tracheal deviation present. No thyromegaly present.  Cardiovascular: Normal rate, regular rhythm and normal heart sounds.  Exam reveals no gallop and no friction rub.   No murmur heard. Pulmonary/Chest: Effort normal and breath sounds normal. No respiratory distress. She has no wheezes. She exhibits no tenderness.  Abdominal: Soft. Bowel sounds are normal. She exhibits no distension and no mass. There is no tenderness. There is no rebound and no guarding.  Musculoskeletal: Normal range of motion. She exhibits no edema or tenderness.  Lymphadenopathy:    She has no cervical adenopathy.  Neurological: She is alert and oriented to person, place, and time. No cranial nerve deficit. She exhibits normal muscle tone.  Normal strength and sensation x 4 ext  Skin: Skin is warm and dry. No rash noted. No erythema. No pallor.  3 cm circumferential bruise to L proximal arm  Psychiatric: She  has a normal mood and affect. Judgment normal.  Nursing note and vitals reviewed.   ED Course  Procedures (including critical care time)  DIAGNOSTIC STUDIES: Oxygen Saturation is 94% on RA, adequate by my interpretation.    COORDINATION OF CARE: 12:20 AM-Discussed treatment plan with pt at bedside and pt agreed to plan.     Labs Review Labs Reviewed  CBC WITH DIFFERENTIAL/PLATELET - Abnormal; Notable for the following:    Hemoglobin 11.9 (*)    RDW 16.6 (*)    Platelets 441 (*)    Lymphocytes Relative 49 (*)    Lymphs Abs 4.6 (*)    All other components within normal limits  CK - Abnormal; Notable for the following:    Total  CK 2872 (*)    All other components within normal limits  CBG MONITORING, ED - Abnormal; Notable for the following:    Glucose-Capillary 132 (*)    All other components within normal limits  I-STAT CHEM 8, ED - Abnormal; Notable for the following:    Potassium 3.3 (*)    Glucose, Bld 110 (*)    Calcium, Ion 1.03 (*)    All other components within normal limits  PROTIME-INR  APTT  I-STAT CHEM 8, ED    Imaging Review No results found.   EKG Interpretation None      MDM   Final diagnoses:  None    patient since emergency department for numbness tingling and burning in all 4 extremities. She states it is worse in her right hand. She does have history of diabetes however this is well controlled. She denies having neuropathy in the past. 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 She states that this does make her symptoms worse. Is not concerned the patient is having diabetic neuropathy and possibly carpal tunnel syndrome as well. Education was provided. Patient was given Motrin and encouraged to continue this at home.  Wrist splint was applied. Patient does have bruising on exam, will obtain CBC and CK for evaluation.   CK elevated to 2872.   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 She's also had muscle spasms in her back recently. She denies any significant trauma as an etiology of her elevated CK. Will obtain creatinine to ensure there is no kidney injury. She is advised to continue drinking  Fluids at home.   I personally performed the services described in this documentation, which was scribed in my presence. The recorded information has been reviewed and is accurate.   Everlene Balls, MD 12/21/14 [phone]

## 2014-12-21 NOTE — ED Notes (Signed)
Pt made aware to return if symptoms worsen or if any life threatening symptoms occur.   

## 2014-12-21 NOTE — ED Notes (Signed)
Security at bedside

## 2014-12-21 NOTE — Discharge Instructions (Signed)
Carpal Tunnel Syndrome Ms. Demby, Your blood work did not show a cause for your bruising.  Wear your wrist splint at night and when at work.  Take motrin as needed for pain and follow up with your primary physician within 3 days for treatment of your nerve pain.  Drink plenty of fluids because your muscle enzymes are elevated and need to be flushed out of your system.  If symptoms worsen, come back to the ED immediately. Thank you. The carpal tunnel is an area under the skin of the palm of your hand. Nerves, blood vessels, and strong tissues (tendons) pass through the tunnel. The tunnel can become puffy (swollen). If this happens, a nerve can be pinched in the wrist. This causes carpal tunnel syndrome.  HOME CARE  Take all medicine as told by your doctor.  If you were given a splint, wear it as told. Wear it at night or at times when your doctor told you to.  Rest your wrist from the activity that causes your pain.  Put ice on your wrist after long periods of wrist activity.  Put ice in a plastic bag.  Place a towel between your skin and the bag.  Leave the ice on for 15-20 minutes, 03-04 times a day.  Keep all doctor visits as told. GET HELP RIGHT AWAY IF:  You have new problems you cannot explain.  Your problems get worse and medicine does not help. MAKE SURE YOU:   Understand these instructions.  Will watch your condition.  Will get help right away if you are not doing well or get worse. Document Released: 08/23/2011 Document Revised: 11/26/2011 Document Reviewed: 08/23/2011 [facility] Patient Information 2015 [place], [place]. This information is not intended to replace advice given to you by your health care provider. Make sure you discuss any questions you have with your health care provider. Diabetic Neuropathy Diabetic neuropathy is a nerve disease or nerve damage that is caused by diabetes mellitus. About half of all people with diabetes mellitus have some form of nerve  damage. Nerve damage is more common in those who have had diabetes mellitus for many years and who generally have not had good control of their blood sugar (glucose) level. Diabetic neuropathy is a common complication of diabetes mellitus. There are three more common types of diabetic neuropathy and a fourth type that is less common and less understood:   Peripheral neuropathy--This is the most common type of diabetic neuropathy. It causes damage to the nerves of the feet and legs first and then eventually the hands and arms.The damage affects the ability to sense touch.  Autonomic neuropathy--This type causes damage to the autonomic nervous system, which controls the following functions:  Heartbeat.  Body temperature.  Blood pressure.  Urination.  Digestion.  Sweating.  Sexual function.  Focal neuropathy--Focal neuropathy can be painful and unpredictable and occurs most often in older adults with diabetes mellitus. It involves a specific nerve or one area and often comes on suddenly. It usually does not cause long-term problems.  Radiculoplexus neuropathy-- Sometimes called lumbosacral radiculoplexus neuropathy, radiculoplexus neuropathy affects the nerves of the thighs, hips, buttocks, or legs. It is more common in people with type 2 diabetes mellitus and in older men. It is characterized by debilitating pain, weakness, and atrophy, usually in the thigh muscles. CAUSES  The cause of peripheral, autonomic, and focal neuropathies is diabetes mellitus that is uncontrolled and high glucose levels. The cause of radiculoplexus neuropathy is unknown. However, it is thought  to be caused by inflammation related to uncontrolled glucose levels. SIGNS AND SYMPTOMS  Peripheral Neuropathy Peripheral neuropathy develops slowly over time. When the nerves of the feet and legs no longer work there may be:   Burning, stabbing, or aching pain in the legs or feet.  Inability to feel pressure or pain in  your feet. This can lead to:  Thick calluses over pressure areas.  Pressure sores.  Ulcers.  Foot deformities.  Reduced ability to feel temperature changes.  Muscle weakness. Autonomic Neuropathy The symptoms of autonomic neuropathy vary depending on which nerves are affected. Symptoms may include:  Problems with digestion, such as:  Feeling sick to your stomach (nausea).  Vomiting.  Bloating.  Constipation.  Diarrhea.  Abdominal pain.  Difficulty with urination. This occurs if you lose your ability to sense when your bladder is full. Problems include:  Urine leakage (incontinence).  Inability to empty your bladder completely (retention).  Rapid or irregular heartbeat (palpitations).  Blood pressure drops when you stand up (orthostatic hypotension). When you stand up you may feel:  Dizzy.  Weak.  Faint.  In men, inability to attain and maintain an erection.  In women, vaginal dryness and problems with decreased sexual desire and arousal.  Problems with body temperature regulation.  Increased or decreased sweating. Focal Neuropathy  Abnormal eye movements or abnormal alignment of both eyes.  Weakness in the wrist.  Foot drop. This results in an inability to lift the foot properly and abnormal walking or foot movement.  Paralysis on one side of your face (Bell palsy).  Chest or abdominal pain. Radiculoplexus Neuropathy  Sudden, severe pain in your hip, thigh, or buttocks.  Weakness and wasting of thigh muscles.  Difficulty rising from a seated position.  Abdominal swelling.  Unexplained weight loss (usually more than 10 lb [4.5 kg]). DIAGNOSIS  Peripheral Neuropathy Your senses may be tested. Sensory function testing can be done with:  A light touch using a monofilament.  A vibration with tuning fork.  A sharp sensation with a pin prick. Other tests that can help diagnose neuropathy are:  Nerve conduction velocity. This test checks  the transmission of an electrical current through a nerve.  Electromyography. This shows how muscles respond to electrical signals transmitted by nearby nerves.  Quantitative sensory testing. This is used to assess how your nerves respond to vibrations and changes in temperature. Autonomic Neuropathy Diagnosis is often based on reported symptoms. Tell your health care provider if you experience:   Dizziness.   Constipation.   Diarrhea.   Inappropriate urination or inability to urinate.   Inability to get or maintain an erection.  Tests that may be done include:   Electrocardiography or Holter monitor. These are tests that can help show problems with the heart rate or heart rhythm.   An X-ray exam may be done. Focal Neuropathy Diagnosis is made based on your symptoms and what your health care provider finds during your exam. Other tests may be done. They may include:  Nerve conduction velocities. This checks the transmission of electrical current through a nerve.  Electromyography. This shows how muscles respond to electrical signals transmitted by nearby nerves.  Quantitative sensory testing. This test is used to assess how your nerves respond to vibration and changes in temperature. Radiculoplexus Neuropathy  Often the first thing is to eliminate any other issue or problems that might be the cause, as there is no stick test for diagnosis.  X-ray exam of your spine and lumbar region.  Spinal tap to rule out cancer.  MRI to rule out other lesions. TREATMENT  Once nerve damage occurs, it cannot be reversed. The goal of treatment is to keep the disease or nerve damage from getting worse and affecting more nerve fibers. Controlling your blood glucose level is the key. Most people with radiculoplexus neuropathy see at least a partial improvement over time. You will need to keep your blood glucose and HbA1c levels in the target range determined by your health care provider.  Things that help control blood glucose levels include:   Blood glucose monitoring.   Meal planning.   Physical activity.   Diabetes medicine.  Over time, maintaining lower blood glucose levels helps lessen symptoms. Sometimes, prescription pain medicine is needed. HOME CARE INSTRUCTIONS:  Do not smoke.  Keep your blood glucose level in the range that you and your health care provider have determined acceptable for you.  Keep your blood pressure level in the range that you and your health care provider have determined acceptable for you.  Eat a well-balanced diet.  Be active every day.  Check your feet every day. SEEK MEDICAL CARE IF:   You have burning, stabbing, or aching pain in the legs or feet.  You are unable to feel pressure or pain in your feet.  You develop problems with digestion such as:  Nausea.  Vomiting.  Bloating.  Constipation.  Diarrhea.  Abdominal pain.  You have difficulty with urination, such as:  Incontinence.  Retention.  You have palpitations.  You develop orthostatic hypotension. When you stand up you may feel:  Dizzy.  Weak.  Faint.  You cannot attain and maintain an erection (in men).  You have vaginal dryness and problems with decreased sexual desire and arousal (in women).  You have severe pain in your thighs, legs, or buttocks.  You have unexplained weight loss. Document Released: 11/12/2001 Document Revised: 06/24/2013 Document Reviewed: 02/12/2013 [facility] Patient Information 2015 [place], [place]. This information is not intended to replace advice given to you by your health care provider. Make sure you discuss any questions you have with your health care provider.

## 2014-12-24 ENCOUNTER — Ambulatory Visit (INDEPENDENT_AMBULATORY_CARE_PROVIDER_SITE_OTHER): Payer: 59

## 2014-12-24 ENCOUNTER — Ambulatory Visit (INDEPENDENT_AMBULATORY_CARE_PROVIDER_SITE_OTHER): Payer: 59 | Admitting: Internal Medicine

## 2014-12-24 VITALS — BP 128/86 | HR 97 | Temp 97.5°F | Resp 18 | Ht 60.25 in | Wt 281.8 lb

## 2014-12-24 DIAGNOSIS — M542 Cervicalgia: Secondary | ICD-10-CM | POA: Diagnosis not present

## 2014-12-24 DIAGNOSIS — M5441 Lumbago with sciatica, right side: Secondary | ICD-10-CM | POA: Diagnosis not present

## 2014-12-24 DIAGNOSIS — R202 Paresthesia of skin: Secondary | ICD-10-CM | POA: Diagnosis not present

## 2014-12-24 DIAGNOSIS — R748 Abnormal levels of other serum enzymes: Secondary | ICD-10-CM | POA: Diagnosis not present

## 2014-12-24 LAB — BASIC METABOLIC PANEL
BUN: 8 mg/dL (ref 6–23)
CO2: 31 mEq/L (ref 19–32)
Calcium: 7.7 mg/dL — ABNORMAL LOW (ref 8.4–10.5)
Chloride: 98 mEq/L (ref 96–112)
Creat: 0.97 mg/dL (ref 0.50–1.10)
GLUCOSE: 112 mg/dL — AB (ref 70–99)
POTASSIUM: 3.8 meq/L (ref 3.5–5.3)
Sodium: 137 mEq/L (ref 135–145)

## 2014-12-24 LAB — CK: Total CK: 2008 U/L — ABNORMAL HIGH (ref 7–177)

## 2014-12-24 LAB — POCT SEDIMENTATION RATE: POCT SED RATE: 36 mm/hr — AB (ref 0–22)

## 2014-12-24 MED ORDER — DIAZEPAM 5 MG PO TABS: 5.0000 mg | ORAL_TABLET | Freq: Four times a day (QID) | ORAL | Status: DC | PRN

## 2014-12-24 NOTE — Patient Instructions (Signed)
Don't take both the Xanax and the Valium. You should have a bottle of meloxicam at home. Take it once daily. If you can't find it, there is a refill at the pharmacy.  You have muscle spasm in your neck. 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  Wear the wrist splint on the RIGHT.  If you are not improving, we will get you set up with a specialist. Once you are well, we'll plan for you to see a physical therapist to work on body mechanics and core muscle strengthening.  Easy walking can help. Drink 64 ounces of water each day.  I will contact you with your lab results as soon as they are available.   If you have not heard from me in 2 weeks, please contact me.  The fastest way to get your results is to register for My Chart (see the instructions on the last page of this printout).

## 2014-12-24 NOTE — Progress Notes (Signed)
Patient ID: Emily Mathis, female    DOB: 21-Nov-1965, 49 y.o.   MRN: [id number]  PCP: Olene Floss  Subjective:   Chief Complaint  Patient presents with  . Back Pain  . Numbness    hands/feet    HPI  Presents for follow-up after ED visit on 12/21/2014 where she went complaining of numbness and tingling in her hands and feet x 3 weeks. She was diagnosed with diabetic peripheral neuropathy and possible concomitant carpal tunnel syndrome and given a RIGHT wrist splint. CK was also elevated and needs follow up.  Results for orders placed or performed during the hospital encounter of 12/21/14  CBC with Differential/Platelet  Result Value Ref Range   WBC 9.4 4.0 - 10.5 K/uL   RBC 4.22 3.87 - 5.11 MIL/uL   Hemoglobin 11.9 (L) 12.0 - 15.0 g/dL   HCT 01.7 51.0 - 25.8 %   MCV 90.3 78.0 - 100.0 fL   MCH 28.2 26.0 - 34.0 pg   MCHC 31.2 30.0 - 36.0 g/dL   RDW 52.7 (H) 78.2 - 42.3 %   Platelets 441 (H) 150 - 400 K/uL   Neutrophils Relative % 43 43 - 77 %   Neutro Abs 4.0 1.7 - 7.7 K/uL   Lymphocytes Relative 49 (H) 12 - 46 %   Lymphs Abs 4.6 (H) 0.7 - 4.0 K/uL   Monocytes Relative 5 3 - 12 %   Monocytes Absolute 0.5 0.1 - 1.0 K/uL   Eosinophils Relative 2 0 - 5 %   Eosinophils Absolute 0.2 0.0 - 0.7 K/uL   Basophils Relative 1 0 - 1 %   Basophils Absolute 0.1 0.0 - 0.1 K/uL  CK  Result Value Ref Range   Total CK 2872 (H) 7 - 177 U/L  Protime-INR  Result Value Ref Range   Prothrombin Time 13.5 11.6 - 15.2 seconds   INR 1.02 0.00 - 1.49  APTT  Result Value Ref Range   aPTT 30 24 - 37 seconds  POC CBG, ED  Result Value Ref Range   Glucose-Capillary 132 (H) 70 - 99 mg/dL  I-stat chem 8, ed  Result Value Ref Range   Sodium 141 135 - 145 mmol/L   Potassium 3.3 (L) 3.5 - 5.1 mmol/L   Chloride 101 96 - 112 mmol/L   BUN 20 6 - 23 mg/dL   Creatinine, Ser 5.36 0.50 - 1.10 mg/dL   Glucose, Bld 144 (H) 70 - 99 mg/dL   Calcium, Ion 3.15 (L) 1.12 - 1.23 mmol/L   TCO2 26 0 -  100 mmol/L   Hemoglobin 13.9 12.0 - 15.0 g/dL   HCT 40.0 86.7 - 61.9 %   3 weeks ago her RIGHT foot felt tingly, and she found that wiggling it would resolve the symptoms. Then she noticed symptoms in her hands. Symptoms worsened with typing, using her phone, and she starting dropping things.  She developed pain in her entire RIGHT arm up to her neck.  Then symptoms began on the LEFT hand and arm, then cramping pain in the RIGHT leg and RIGHT side. She noticed she started bruising on 12/11/14. She thought she had hit her arm, but then noticed several other bruises in places without recalled trauma.  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 Has received some video and one-on-one training on proper lifting techniques.   The wrist splint provided in the ED helped a little on  the RIGHT, so she tried it on the LEFT as well. Her daughter has commented that her hands look swollen.  "I just am feeling so bad, you know, and I keep wondering what am I doing wrong? And I just get so overwhelmed." She describes pain that feels like labor contractions.  C-spine and LS-spine films were done in 01/2013 and normal except for post surgical changes and mild facet hypertrophic changes at C5-6. At that time she was complaining of back pain, "cramping, like muscle spasms," pain radiating into the RIGHT foot, and tingling in both hands and feet. Notes since that visit (her first here) reviewed. Complaints today are nearly the same. Since that time, she has been diagnosed with diabetes.  TSH in 10/2014 was normal. A1C was 6.6% at that time. A1C 6.4% 06/2013.   Review of Systems Review of Systems  Constitutional: Positive for fatigue. Negative for fever, chills, diaphoresis and unexpected weight change.  HENT: Negative.   Eyes: Negative.   Respiratory: Negative.   Cardiovascular: Negative.   Gastrointestinal: Negative.   Endocrine: Negative.   Genitourinary:  Negative.   Musculoskeletal: Positive for myalgias, back pain and neck pain. Negative for joint swelling, arthralgias, gait problem and neck stiffness.  Neurological: Positive for weakness and numbness. Negative for dizziness, tremors, seizures, syncope, speech difficulty, light-headedness and headaches.  Hematological: Negative.   Psychiatric/Behavioral: Positive for dysphoric mood. Negative for suicidal ideas, hallucinations and self-injury. The patient is nervous/anxious.        Patient Active Problem List   Diagnosis Date Noted  . Diabetes mellitus type 2, uncomplicated 09/14/2013  . Keloid scar, cervical incision 09/02/2013  . Reaction, situational, acute, to stress 07/02/2013  . 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 Severe obesity (BMI >= 40) 03/27/2013  . GERD (gastroesophageal reflux disease) 02/15/2013  . Hypocalcemia 01/07/2013  . Chest pain on exertion 07/24/2011     Prior to Admission medications   Medication Sig Start Date End Date Taking? Authorizing Provider  albuterol (PROVENTIL HFA;VENTOLIN HFA) 108 (90 BASE) MCG/ACT inhaler Inhale 2 puffs into the lungs daily as needed. For shortness of breath   Yes Historical Provider, MD  ALPRAZolam (XANAX) 0.5 MG tablet Take 1 tablet (0.5 mg total) by mouth at bedtime as needed for sleep. 10/18/14  Yes Fernande Bras, PA-C  Aspirin-Salicylamide-Caffeine [id number] MG PACK Take 1 Package by mouth daily as needed (pain).   Yes Historical Provider, MD  Canagliflozin-Metformin HCl 150-500 MG TABS Take 1 tablet by mouth daily.   Yes Historical Provider, MD  chlorthalidone (HYGROTON) 25 MG tablet Take 1 tablet (25 mg total) by mouth daily. 10/18/14  Yes Allora Bains S Kristain Filo, PA-C  cyclobenzaprine (FLEXERIL) 10 MG tablet Take 1 tablet (10 mg total) by mouth 3 (three) times daily as needed for muscle spasms. 10/18/14  Yes Romond Pipkins S Esaul Dorwart, PA-C  levothyroxine (SYNTHROID, LEVOTHROID) 125 MCG tablet Take 250 mcg by mouth daily. 09/15/14  Yes  Historical Provider, MD  meloxicam (MOBIC) 15 MG tablet Take 1 tablet (15 mg total) by mouth as needed. Patient taking differently: Take 15 mg by mouth as needed for pain.  10/18/14  Yes Wanisha Shiroma S Meshelle Holness, PA-C  pantoprazole (PROTONIX) 40 MG tablet TAKE 1 TABLET (40 MG TOTAL) BY MOUTH 2 (TWO) TIMES DAILY. 10/18/14  Yes Mitchelle Sultan S Ashyah Quizon, PA-C  QVAR 80 MCG/ACT inhaler INHALE 2 PUFFS INTO THE LUNGS 2 (TWO) TIMES DAILY. 11/25/14  Yes Calvin Jablonowski Tessa Lerner, PA-C     Allergies  Allergen Reactions  . Latex  Itchy & leaves marks on skin  . Dilaudid [Hydromorphone Hcl] Itching  . Hydrocodone Itching       Objective:  Physical Exam  Physical Exam  Constitutional: She is oriented to person, place, and time. Vital signs are normal. She appears well-developed and well-nourished. She is active and cooperative. No distress.  BP 128/86 mmHg  Pulse 97  Temp(Src) 97.5 F (36.4 C) (Oral)  Resp 18  Ht 5' 0.25" (1.53 m)  Wt 281 lb 12.8 oz (127.824 kg)  BMI 54.60 kg/m2  SpO2 97%  HENT:  Head: Normocephalic and atraumatic.  Right Ear: Hearing normal.  Left Ear: Hearing normal.  Eyes: Conjunctivae are normal. No scleral icterus.  Neck: Normal range of motion. Neck supple. No thyromegaly present.  Cardiovascular: Normal rate, regular rhythm and normal heart sounds.   Pulses:      Radial pulses are 2+ on the right side, and 2+ on the left side.  Pulmonary/Chest: Effort normal and breath sounds normal.  Musculoskeletal:       Right shoulder: She exhibits tenderness, bony tenderness and pain. She exhibits normal range of motion, no swelling, no effusion, no crepitus, no deformity, no laceration, no spasm and normal strength.       Left shoulder: Normal.       Right elbow: Normal.      Left elbow: Normal.       Right wrist: She exhibits decreased range of motion and tenderness. She exhibits no bony tenderness, no swelling, no effusion, no crepitus, no deformity and no laceration.       Left wrist: She  exhibits decreased range of motion and tenderness. She exhibits no bony tenderness, no swelling, no effusion, no crepitus, no deformity and no laceration.       Cervical back: She exhibits tenderness (RIGHT sided) and pain. She exhibits normal range of motion, no bony tenderness, no swelling, no edema, no deformity, no laceration and no spasm.       Thoracic back: She exhibits tenderness and pain. She exhibits normal range of motion, no bony tenderness, no swelling, no edema, no deformity, no laceration and no spasm.       Lumbar back: She exhibits tenderness and pain. She exhibits normal range of motion, no bony tenderness, no swelling, no edema, no deformity, no laceration and no spasm.       Right upper arm: Normal.       Left upper arm: Normal.       Right forearm: Normal.       Left forearm: Normal.       Right hand: She exhibits tenderness and swelling (trace). She exhibits normal range of motion, no bony tenderness, normal capillary refill, no deformity and no laceration. Normal sensation noted. Normal strength noted.       Left hand: She exhibits tenderness and swelling (trace). She exhibits normal range of motion, no bony tenderness, normal capillary refill, no deformity and no laceration. Normal sensation noted. Normal strength noted.  Lymphadenopathy:       Head (right side): No tonsillar, no preauricular, no posterior auricular and no occipital adenopathy present.       Head (left side): No tonsillar, no preauricular, no posterior auricular and no occipital adenopathy present.    She has no cervical adenopathy.       Right: No supraclavicular adenopathy present.       Left: No supraclavicular adenopathy present.  Neurological: She is alert and oriented to person, place, and time. She has normal  strength. No cranial nerve deficit or sensory deficit.  phalen's and tinel's without worsening or new symptoms.  Skin: Skin is warm, dry and intact. No rash noted. No cyanosis or erythema. Nails  show no clubbing.  Psychiatric: Her speech is normal and behavior is normal. Her mood appears anxious (tearful). Her affect is not angry, not blunt, not labile and not inappropriate. She does not exhibit a depressed mood.       C-Spine: [facility] reading (PRIMARY) by  Dr. Perrin Maltese. Degenerative changes and anterior spurring at C5-6; loss of lordosis. Surgical clips consistent with thyroidectomy.  LS-spine: [facility] reading (PRIMARY) by  Dr. Perrin Maltese. Degenerative changes are mild. Otherwise normal L-Spine.      Assessment & Plan:  1. Neck pain 2. Right-sided low back pain with right-sided sciatica Degenerative changes in the neck and low back are mild. She had similar symptoms about 2 years ago. Treat with Valium and meloxicam as before. Hold off on oral prednisone now due to interim development of diabetes. She has meloxicam at home. Plan PT for core strengthening and body mechanics training once acute symptoms are resolved. - DG Cervical Spine 2 or 3 views; Future - DG Lumbar Spine 2-3 Views; Future - diazepam (VALIUM) 5 MG tablet; Take 1 tablet (5 mg total) by mouth every 6 (six) hours as needed for muscle spasms.  Dispense: 30 tablet; Refill: 0  3. Paresthesias Cannot exclude CTS, but given the symptoms in her feet as well, less likely. Her diabetes is well controlled, making diabetic peripheral neuropathy of this severity also less likely. Possibly due to degenerative changes in the neck and back. If no improvement, plan referral to ortho or rheumatology, depending on ESR results. - POCT SEDIMENTATION RATE  4. Elevated CK Continue to hydrate. Expect CK to remain stable or be slightly improved. Repeat in 1 week. - Basic metabolic panel - CK  5. Severe obesity (BMI >= 40) Healthy eating and regular exercise. Her weight likely contributes to several of her chronic issues, and certainly the recurrence of the neck and back pain.   Fernande Bras, PA-C Physician Assistant-Certified [facility]

## 2014-12-30 ENCOUNTER — Ambulatory Visit (INDEPENDENT_AMBULATORY_CARE_PROVIDER_SITE_OTHER): Payer: 59 | Admitting: Physician Assistant

## 2014-12-30 VITALS — BP 138/79 | HR 96 | Temp 97.5°F | Resp 18 | Ht 60.03 in | Wt 279.0 lb

## 2014-12-30 DIAGNOSIS — M25531 Pain in right wrist: Secondary | ICD-10-CM | POA: Diagnosis not present

## 2014-12-30 DIAGNOSIS — R748 Abnormal levels of other serum enzymes: Secondary | ICD-10-CM | POA: Diagnosis not present

## 2014-12-30 DIAGNOSIS — M542 Cervicalgia: Secondary | ICD-10-CM | POA: Diagnosis not present

## 2014-12-30 DIAGNOSIS — M25532 Pain in left wrist: Secondary | ICD-10-CM | POA: Diagnosis not present

## 2014-12-30 DIAGNOSIS — R202 Paresthesia of skin: Secondary | ICD-10-CM

## 2014-12-30 NOTE — Progress Notes (Signed)
Subjective:    Patient ID: Emily Mathis, female    DOB: [birth year], 49 y.o.   MRN: [id number]  HPI  This is a 48 year old female with PMH DM2 and anxiety who is presenting with 5 weeks of right sided neck pain that radiates to right arm, right lower back pain that radiates into right leg and paresthesias in bilateral legs and arms. She was originally seen in the ED 2 weeks ago and felt that her symptoms were d/t diabetic neuropathy and carpal tunnel. CK drawn at that time was 2872. She was advised to push fluids at home. She was given a right wrist splint for carpal tunnel. She follow up [name] a few days later who gave her meloxicam and valium for muscle spasms and pain. These have been helping some but overall symptoms have stayed the same. The right wrist splint is helping but her left hand is hurting as well and she wants another wrist splint. She denies weakness or problems with bladder/bowel. She states 2 years ago she thinks she had similar symptoms and they got better after a time. Pt could not recall how long those symptoms lasted. 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  In [name]'s last note she indicated if symptoms were not improving, referral to rheum or ortho may be necessary. CK 1 week ago had decreased to 2008. ESR mildly elevated to 36.  Review of Systems  Constitutional: Negative for fever and chills.  Respiratory: Negative for cough.   Gastrointestinal: Negative for nausea, vomiting and abdominal pain.  Musculoskeletal: Positive for back pain and neck pain.  Skin: Negative for color change.  Neurological: Positive for numbness. Negative for weakness.    Patient Active Problem List   Diagnosis Date Noted  . Diabetes mellitus type 2, uncomplicated 65/68/1275  . Keloid scar, cervical incision 09/02/2013  . Reaction, situational, acute, to stress 07/02/2013  .  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 Severe obesity (BMI >= 40) 03/27/2013  . GERD (gastroesophageal reflux disease) 02/15/2013  . Hypocalcemia 01/07/2013  . Chest pain on exertion 07/24/2011   Prior to Admission medications   Medication Sig Start Date End Date Taking? Authorizing Provider  albuterol (PROVENTIL HFA;VENTOLIN HFA) 108 (90 BASE) MCG/ACT inhaler Inhale 2 puffs into the lungs daily as needed. For shortness of breath   Yes Historical Provider, MD  ALPRAZolam (XANAX) 0.5 MG tablet Take 1 tablet (0.5 mg total) by mouth at bedtime as needed for sleep. 10/18/14  Yes Fara Chute, PA-C  Aspirin-Salicylamide-Caffeine 170-017-49.4 MG PACK Take 1 Package by mouth daily as needed (pain).   Yes Historical Provider, MD  Canagliflozin-Metformin HCl 150-500 MG TABS Take 1 tablet by mouth daily.   Yes Historical Provider, MD  chlorthalidone (HYGROTON) 25 MG tablet Take 1 tablet (25 mg total) by mouth daily. 10/18/14  Yes [name] S Jeffery, PA-C  cyclobenzaprine (FLEXERIL) 10 MG tablet Take 1 tablet (10 mg total) by mouth 3 (three) times daily as needed for muscle spasms. 10/18/14  Yes [name] S Jeffery, PA-C  diazepam (VALIUM) 5 MG tablet Take 1 tablet (5 mg total) by mouth every 6 (six) hours as needed for muscle spasms. 12/24/14  Yes [name] S Jeffery, PA-C  levothyroxine (SYNTHROID, LEVOTHROID) 125 MCG tablet Take 250 mcg by mouth daily. 09/15/14  Yes Historical Provider, MD  meloxicam (MOBIC) 15 MG tablet Take 1 tablet (15  mg total) by mouth as needed. Patient taking differently: Take 15 mg by mouth as needed for pain.  10/18/14  Yes [name] S Jeffery, PA-C  pantoprazole (PROTONIX) 40 MG tablet TAKE 1 TABLET (40 MG TOTAL) BY MOUTH 2 (TWO) TIMES DAILY. 10/18/14  Yes [name] S Jeffery, PA-C  QVAR 80 MCG/ACT inhaler INHALE 2 PUFFS INTO THE LUNGS 2 (TWO) TIMES DAILY. 11/25/14  Yes [name] Janalee Dane, PA-C   Allergies  Allergen Reactions  . Latex     Itchy & leaves marks on skin  . Dilaudid [Hydromorphone  Hcl] Itching  . Hydrocodone Itching   Patient's social and family history were reviewed.     Objective:   Physical Exam  Constitutional: She is oriented to person, place, and time. She appears well-developed and well-nourished. No distress.  HENT:  Head: Normocephalic and atraumatic.  Right Ear: Hearing normal.  Left Ear: Hearing normal.  Nose: Nose normal.  Eyes: Conjunctivae and lids are normal. Right eye exhibits no discharge. Left eye exhibits no discharge. No scleral icterus.  Cardiovascular: Normal rate, regular rhythm, normal heart sounds and normal pulses.   No murmur heard. Pulmonary/Chest: Effort normal and breath sounds normal. No respiratory distress. She has no wheezes. She has no rhonchi. She has no rales.  Musculoskeletal: Normal range of motion.       Cervical back: Normal. She exhibits normal range of motion, no tenderness and no bony tenderness.       Thoracic back: She exhibits no bony tenderness.       Lumbar back: She exhibits no bony tenderness.  1+ edema in bilateral LE  Lymphadenopathy:    She has no cervical adenopathy.  Neurological: She is alert and oriented to person, place, and time. She has normal strength and normal reflexes. A sensory deficit (sensory decreased in right arm and right leg) is present. Gait normal.  Skin: Skin is warm, dry and intact. No lesion and no rash noted.  Psychiatric: She has a normal mood and affect. Her speech is normal and behavior is normal. Thought content normal.   BP 138/79 mmHg  Pulse 96  Temp(Src) 97.5 F (36.4 C) (Oral)  Resp 18  Ht 5' 0.02" (1.525 m)  Wt 279 lb (126.554 kg)  BMI 54.42 kg/m2  SpO2 99%     Assessment & Plan:  1. Elevated CK 2. Paresthesias 3. Neck pain CK has continued to decrease to 1230. She was advised to continue to rest and push fluids. 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 Gave work note. 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 Fit pt  for bilateral wrist splints. Right wrist splint provided in ED did not seem to fit well. Continue meloxicam and valium for symptoms. Referred to ortho for further evaluation. ESR not elevated enough to warrant rheum referral. - CK - Ambulatory referral to [place]. Drenda Freeze, M[facility]  12/30/2014

## 2014-12-30 NOTE — Patient Instructions (Signed)
Continue valium and mobic. Wear both braces if it is helping. I will call you with the results of your muscle enzyme.  Continue to drink plenty of water and rest.

## 2014-12-31 LAB — CK: Total CK: 1230 U/L — ABNORMAL HIGH (ref 7–177)

## 2015-01-06 ENCOUNTER — Other Ambulatory Visit: Payer: Self-pay | Admitting: Surgery

## 2015-01-18 ENCOUNTER — Ambulatory Visit (INDEPENDENT_AMBULATORY_CARE_PROVIDER_SITE_OTHER): Payer: 59 | Admitting: Physician Assistant

## 2015-01-18 ENCOUNTER — Encounter: Payer: Self-pay | Admitting: Physician Assistant

## 2015-01-18 VITALS — BP 140/90 | HR 96 | Temp 98.1°F | Resp 16 | Ht 63.5 in | Wt 274.8 lb

## 2015-01-18 DIAGNOSIS — E119 Type 2 diabetes mellitus without complications: Secondary | ICD-10-CM

## 2015-01-18 DIAGNOSIS — Z114 Encounter for screening for human immunodeficiency virus [HIV]: Secondary | ICD-10-CM | POA: Diagnosis not present

## 2015-01-18 DIAGNOSIS — R202 Paresthesia of skin: Secondary | ICD-10-CM

## 2015-01-18 DIAGNOSIS — I10 Essential (primary) hypertension: Secondary | ICD-10-CM | POA: Diagnosis not present

## 2015-01-18 DIAGNOSIS — E890 Postprocedural hypothyroidism: Secondary | ICD-10-CM

## 2015-01-18 DIAGNOSIS — F430 Acute stress reaction: Secondary | ICD-10-CM

## 2015-01-18 DIAGNOSIS — R768 Other specified abnormal immunological findings in serum: Secondary | ICD-10-CM | POA: Diagnosis not present

## 2015-01-18 DIAGNOSIS — R748 Abnormal levels of other serum enzymes: Secondary | ICD-10-CM

## 2015-01-18 LAB — CK: CK TOTAL: 336 U/L — AB (ref 7–177)

## 2015-01-18 LAB — GLUCOSE, POCT (MANUAL RESULT ENTRY): POC Glucose: 134 mg/dl — AB (ref 70–99)

## 2015-01-18 LAB — RHEUMATOID FACTOR

## 2015-01-18 LAB — POCT GLYCOSYLATED HEMOGLOBIN (HGB A1C): Hemoglobin A1C: 6.4

## 2015-01-18 MED ORDER — CANAGLIFLOZIN-METFORMIN HCL 150-500 MG PO TABS: 1.0000 | ORAL_TABLET | Freq: Every day | ORAL | Status: DC

## 2015-01-18 MED ORDER — SERTRALINE HCL 50 MG PO TABS
50.0000 mg | ORAL_TABLET | Freq: Every day | ORAL | Status: DC
Start: 2015-01-18 — End: 2015-03-11

## 2015-01-18 NOTE — Progress Notes (Signed)
Subjective:    Patient ID: Emily Mathis, female    DOB: 16-Apr-1966, 49 y.o.   MRN: [id number]  HPI  Emily Mathis is a 49 year old female who presents today for 3 month follow-up of diabetes.  She has not been checking her blood sugars at home; "not like I should." She does not eat healthy and typically her meals consist of fast-food. She tries to eat a salad, chicken sandwich, and apple if she is eating out. She typically tries to drink water throughout the day, and denies drinking soda or sugary drinks. She sees an endocrinologist who monitors her hypothyroidism and diabetes. Says her endocrinologist put her on Canagliflozin-Metformin samples, but she only has 5 days left and needs a refill.   She also complains of her "body hurting all over." Her muscles ache in her legs, arms, back, and neck. She is also complaining of numbness and tingling on the right side of her neck down her right arm and to her fingertips. Feels almost as if her "fingertips were shut in a door." Says these symptoms have been going on for about 4 weeks. She has been seeing a chiropractor and he scheduled an MRI for Saturday 01/22/15. She feels that her symptoms have progressively worsened since she had thyroid surgery in 2014. She takes Valium and Meloxicam for the pain, as well as Flexeril for the muscle spasms. She thinks they help.  She was seen in the Emergency Department on 12/21/14 for similar neck pain and tingling symptoms. At that time, the numbness and tingling in her right arm had persisted for 3 weeks and wouldn't go away, whereas it had gone away in the past. Her creatine kinase was elevated at the time at 2872. She followed-up here on 12/24/14 for same symptoms. A cervical spine x-ray was done which indicated muscle spasms. Her CK was elevated at 2008 at that time.   She has been stressed for the last several months. The pain and symptoms she is experiencing are causing her to be sad and stressed out.   Review of  Systems  Constitutional: Positive for fatigue. Negative for fever, chills and diaphoresis.  HENT: Positive for congestion, postnasal drip, sinus pressure and sore throat. Negative for ear pain.   Eyes: Negative for pain and itching.  Respiratory: Positive for cough. Negative for shortness of breath.   Cardiovascular: Negative for chest pain and palpitations.  Gastrointestinal: Negative for nausea, vomiting, abdominal pain, diarrhea and constipation.  Genitourinary: Negative for dysuria, urgency and frequency.  Musculoskeletal: Positive for myalgias, back pain, arthralgias and neck pain.  Neurological: Positive for numbness (Right arm numbness. Burning in left arm and hand.) and headaches. Negative for dizziness and light-headedness.       Objective:   Physical Exam  Constitutional: She is oriented to person, place, and time. She appears well-developed and well-nourished. No distress.  BP 140/90 mmHg  Pulse 96  Temp(Src) 98.1 F (36.7 C) (Oral)  Resp 16  Ht 5' 3.5" (1.613 m)  Wt 274 lb 12.8 oz (124.648 kg)  BMI 47.91 kg/m2  SpO2 97%  HENT:  Head: Normocephalic and atraumatic.  Right Ear: Hearing, tympanic membrane, external ear and ear canal normal.  Left Ear: Hearing, tympanic membrane, external ear and ear canal normal.  Nose: Nose normal.  Mouth/Throat: Uvula is midline and oropharynx is clear and moist. No oropharyngeal exudate, posterior oropharyngeal edema or posterior oropharyngeal erythema.  Eyes: Conjunctivae are normal. Pupils are equal, round, and reactive to light. No scleral  icterus.  Neck: Normal range of motion. Neck supple.  Cardiovascular: Normal rate, regular rhythm, S1 normal, S2 normal and intact distal pulses.  Exam reveals no gallop and no friction rub.   No murmur heard. Pulses:      Dorsalis pedis pulses are 2+ on the right side, and 2+ on the left side.       Posterior tibial pulses are 2+ on the right side, and 2+ on the left side.  Pulmonary/Chest:  Effort normal and breath sounds normal. She has no wheezes. She has no rhonchi. She has no rales.  Musculoskeletal: She exhibits tenderness (Diffuse tenderness all over body).       Cervical back: She exhibits tenderness.       Lumbar back: She exhibits tenderness.  Upper extremity and lower extremity equal bilaterally. Good grip strength.  Lymphadenopathy:       Head (right side): No submental, no submandibular and no tonsillar adenopathy present.       Head (left side): No submental, no submandibular and no tonsillar adenopathy present.    She has no cervical adenopathy.       Right: No supraclavicular adenopathy present.       Left: No supraclavicular adenopathy present.  Neurological: She is alert and oriented to person, place, and time.  Skin: Skin is warm, dry and intact.  Psychiatric: She exhibits a depressed mood.  Tearful.      Assessment & Plan:  1. Diabetes mellitus without complication Stable. Continue to focus on healthy eating and exercise. She has not seen an ophthalmologist.  - HM Diabetes Foot Exam - POCT glucose (manual entry) - HM Diabetes Eye Exam - POCT glycosylated hemoglobin (Hb A1C) - Canagliflozin-Metformin HCl 150-500 MG TABS; Take 1 tablet by mouth daily.  Dispense: 60 tablet; Refill: 1  2. Elevated CK Diffuse muscle pains on upper and lower extremities. CK has been elevated for the past 4 weeks. Recheck today. - CK  3. Paresthesias Unexplained numbness and tingling. Will do lab work and evaluate the need for referral to rheumatology.  - Rheumatoid factor - ANA  4. Severe obesity (BMI >= 40) Discussed importance of healthy diet and exercise.  5. Hypothyroidism, postsurgical Stable. Monitored through endocrinology. Continue current medication regimen.  6. Essential hypertension Stable. Continue current medication regimen.   7. Screening for HIV (human immunodeficiency virus) - HIV antibody  8. Reaction, situational, acute, to stress She was  tearful in office. The pain and symptoms she is experiencing are causing stress and sadness. Discussed starting a medication and talking with a therapist about all that is going on.  - sertraline (ZOLOFT) 50 MG tablet; Take 1 tablet (50 mg total) by mouth daily.  Dispense: 30 tablet; Refill: 3

## 2015-01-18 NOTE — Patient Instructions (Signed)
Start taking the Sertraline (Zoloft) half tablet daily for one week. Then, take one whole tablet daily.  I will plan to see you back in 4 weeks for follow-up and reevaluation.  Call the mental health number on the back of your insurance card to see which therapist is covered in your network. Make an appointment to see the therapist.  I will let you know the results of your lab work and let you know if we need to send you to a Rheumatologist!  Emily Mathis in there, it will get better!

## 2015-01-18 NOTE — Progress Notes (Signed)
Patient ID: Emily Mathis, female    DOB: December 30, 1965, 49 y.o.   MRN: [id number]  PCP: Olene Floss  Subjective:   Chief Complaint  Patient presents with  . Follow-up    3 mos  . Diabetes  . body hurts    all over, burning in left arm x 1 week  . feels like pins sticking all over body    HPI Presents for evaluation of 3 month follow-up of diabetes.  Sees Dr. Evlyn Kanner for both Diabetes and hypothyroidism (s/p thyroidectomy). Doesn't know when her next visit is with Dr. Evlyn Kanner. At her last visit she was given Canagliflozin-Metformin samples, but she only has 5 days left and needs a refill.   She has not been checking her blood sugars at home; "not like I should." She does not eat healthy and typically her meals consist of fast-food. She tries to eat a salad, chicken sandwich, and apple if she is eating out. She typically tries to drink water throughout the day, and denies drinking soda or sugary drinks.    She also complains of her "body hurting all over." Her muscles ache in her legs, arms, back, and neck. She is also complaining of numbness and tingling on the right side of her neck down her right arm and to her fingertips. Feels almost as if her "fingertips were shut in a door." Says these symptoms have been going on for about 4 weeks. She has been seeing a chiropractor and he scheduled an MRI for Saturday 01/22/15. She feels that her symptoms have progressively worsened since she had thyroid surgery in 2014. She takes Valium and Meloxicam for the pain, as well as Flexeril for the muscle spasms. She thinks they help.  She was seen in the Emergency Department on 12/21/14 for similar neck pain and tingling symptoms. At that time, the numbness and tingling in her right arm had persisted for 3 weeks and wouldn't go away, whereas it had gone away in the past. Her creatine kinase was elevated at the time at 2872. She followed-up here on 12/24/14 for same symptoms. A cervical spine x-ray was done  which indicated muscle spasms. Her CK was elevated at 2008 at that time.   She has been stressed for the last several months. The pain and symptoms she is experiencing are causing her to be sad and stressed out.   Review of Systems Constitutional: Positive for fatigue. Negative for fever, chills and diaphoresis.  HENT: Positive for congestion, postnasal drip, sinus pressure and sore throat. Negative for ear pain.  Eyes: Negative for pain and itching.  Respiratory: Positive for cough. Negative for shortness of breath.  Cardiovascular: Negative for chest pain and palpitations.  Gastrointestinal: Negative for nausea, vomiting, abdominal pain, diarrhea and constipation.  Genitourinary: Negative for dysuria, urgency and frequency.  Musculoskeletal: Positive for myalgias, back pain, arthralgias and neck pain.  Neurological: Positive for numbness (Right arm numbness. Burning in left arm and hand.) and headaches. Negative for dizziness and light-headedness.      Patient Active Problem List   Diagnosis Date Noted  . Diabetes mellitus type 2, uncomplicated 09/14/2013  . Keloid scar, cervical incision 09/02/2013  . Reaction, situational, acute, to stress 07/02/2013  . 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 Severe obesity (BMI >= 40) 03/27/2013  . GERD (gastroesophageal reflux disease) 02/15/2013  . Hypocalcemia 01/07/2013  . Chest pain on exertion 07/24/2011     Prior to Admission medications   Medication Sig Start Date End Date Taking? Authorizing Provider  albuterol (PROVENTIL HFA;VENTOLIN HFA) 108 (90 BASE) MCG/ACT inhaler Inhale 2 puffs into the lungs daily as needed. For shortness of breath   Yes Historical Provider, MD  ALPRAZolam (XANAX) 0.5 MG tablet Take 1 tablet (0.5 mg total) by mouth at bedtime as needed for sleep. 10/18/14  Yes Crislyn Willbanks S Lukka Black, PA-C  Canagliflozin-Metformin HCl 150-500 MG TABS Take 1 tablet by mouth daily. 01/18/15  Yes Lamoine Magallon S Barrie Wale, PA-C  chlorthalidone  (HYGROTON) 25 MG tablet Take 1 tablet (25 mg total) by mouth daily. 10/18/14  Yes Vernessa Likes S Juni Glaab, PA-C  cyclobenzaprine (FLEXERIL) 10 MG tablet Take 1 tablet (10 mg total) by mouth 3 (three) times daily as needed for muscle spasms. 10/18/14  Yes Jaquil Todt S Carlye Panameno, PA-C  diazepam (VALIUM) 5 MG tablet Take 1 tablet (5 mg total) by mouth every 6 (six) hours as needed for muscle spasms. 12/24/14  Yes Penny Frisbie S Ramsie Ostrander, PA-C  levothyroxine (SYNTHROID, LEVOTHROID) 125 MCG tablet Take 250 mcg by mouth daily. 09/15/14  Yes Historical Provider, MD  meloxicam (MOBIC) 15 MG tablet Take 1 tablet (15 mg total) by mouth as needed. Patient taking differently: Take 15 mg by mouth as needed for pain.  10/18/14  Yes Kree Armato S Porche Steinberger, PA-C  pantoprazole (PROTONIX) 40 MG tablet TAKE 1 TABLET (40 MG TOTAL) BY MOUTH 2 (TWO) TIMES DAILY. 10/18/14  Yes Jerone Cudmore S Shavette Shoaff, PA-C  QVAR 80 MCG/ACT inhaler INHALE 2 PUFFS INTO THE LUNGS 2 (TWO) TIMES DAILY. 11/25/14  Yes Betina Puckett Tessa Lerner, PA-C     Allergies  Allergen Reactions  . Latex     Itchy & leaves marks on skin  . Dilaudid [Hydromorphone Hcl] Itching  . Hydrocodone Itching       Objective:  Physical Exam  Constitutional: She is oriented to person, place, and time. She appears well-developed and well-nourished. She is active and cooperative. No distress.  BP 140/90 mmHg  Pulse 96  Temp(Src) 98.1 F (36.7 C) (Oral)  Resp 16  Ht 5' 3.5" (1.613 m)  Wt 274 lb 12.8 oz (124.648 kg)  BMI 47.91 kg/m2  SpO2 97%   Eyes: Conjunctivae are normal. No scleral icterus.  Neck: Neck supple. No thyromegaly present.  Cardiovascular: Regular rhythm, normal heart sounds and intact distal pulses.   No extrasystoles are present. Tachycardia present.   Pulmonary/Chest: Effort normal.  Musculoskeletal: She exhibits tenderness (diffuse tenderness, even with light touch.).  Lymphadenopathy:    She has no cervical adenopathy.  Neurological: She is alert and oriented to person, place, and  time. She has normal strength. No cranial nerve deficit or sensory deficit.  Skin: Skin is warm and dry.  Psychiatric: Her speech is normal and behavior is normal. Her mood appears not anxious. Her affect is not angry, not blunt and not inappropriate. She exhibits a depressed mood.           Assessment & Plan:   1. Diabetes mellitus without complication Update labs and send results to Dr. Evlyn Kanner. Refill medication. Encouraged her to work on making healthier eating choices. - HM Diabetes Foot Exam - POCT glucose (manual entry) - HM Diabetes Eye Exam - POCT glycosylated hemoglobin (Hb A1C) - Canagliflozin-Metformin HCl 150-500 MG TABS; Take 1 tablet by mouth daily.  Dispense: 60 tablet; Refill: 1  2. Elevated CK Has been trending down. Continue to follow. - CK  3. Paresthesias Unclear etiology. C-spine films here last month revealed mild degenerative changes. Anticipate MRI ordered by her chiropractor will be normal. Given the diffuse pain  and tenderness along with paresthesias, plan referral to rheumatology vs. Neurology. Await labs. ESR last month was 36. - Rheumatoid factor - ANA  4. Severe obesity (BMI >= 40) This contributes to several of her medical issues. Exercise is somewhat limited dur to her pain. Healthier eating is needed.  5. Hypothyroidism, postsurgical Continue follow-up with Dr. Evlyn Kanner.  6. Essential hypertension Just at goal. Continue current medications. Continue working on lifestyle modifications as able. Plan addition of ACEI at next visit.  7. Screening for HIV (human immunodeficiency virus) - HIV antibody  8. Reaction, situational, acute, to stress I think that her mood is contributing significantly to her current symptoms of pain. She feels overwhelmed and powerless to make positive change. Encouraged her to schedule with a therapist. Trial of sertraline. RTC 4 weeks. - sertraline (ZOLOFT) 50 MG tablet; Take 1 tablet (50 mg total) by mouth daily.   Dispense: 30 tablet; Refill: 3   Fernande Bras, PA-C Physician Assistant-Certified [facility] .

## 2015-01-19 LAB — HIV ANTIBODY (ROUTINE TESTING W REFLEX): HIV: NONREACTIVE

## 2015-01-19 LAB — ANA: Anti Nuclear Antibody(ANA): POSITIVE — AB

## 2015-01-19 LAB — ANTI-NUCLEAR AB-TITER (ANA TITER): ANA Titer 1: NEGATIVE

## 2015-01-20 NOTE — Addendum Note (Signed)
Addended by: Fara Chute on: 01/20/2015 05:25 PM   Modules accepted: Orders

## 2015-01-26 ENCOUNTER — Encounter: Payer: Self-pay | Admitting: Physician Assistant

## 2015-02-25 ENCOUNTER — Telehealth: Payer: Self-pay | Admitting: Physician Assistant

## 2015-02-25 DIAGNOSIS — F411 Generalized anxiety disorder: Secondary | ICD-10-CM

## 2015-02-25 DIAGNOSIS — M542 Cervicalgia: Secondary | ICD-10-CM

## 2015-02-25 DIAGNOSIS — M5441 Lumbago with sciatica, right side: Secondary | ICD-10-CM

## 2015-03-01 ENCOUNTER — Ambulatory Visit: Payer: 59 | Admitting: Physician Assistant

## 2015-03-01 MED ORDER — ALPRAZOLAM 0.5 MG PO TABS: 0.5000 mg | ORAL_TABLET | Freq: Every evening | ORAL | Status: DC | PRN

## 2015-03-01 MED ORDER — DIAZEPAM 5 MG PO TABS: 5.0000 mg | ORAL_TABLET | Freq: Four times a day (QID) | ORAL | Status: DC | PRN

## 2015-03-01 NOTE — Telephone Encounter (Signed)
Called in Rx's to the pharm. Pt notified through my chart.

## 2015-03-01 NOTE — Addendum Note (Signed)
Addended by: Fara Chute on: 03/01/2015 08:23 AM   Modules accepted: Orders

## 2015-03-01 NOTE — Telephone Encounter (Signed)
Meds ordered this encounter  Medications  . ALPRAZolam (XANAX) 0.5 MG tablet    Sig: Take 1 tablet (0.5 mg total) by mouth at bedtime as needed for sleep.    Dispense:  30 tablet    Refill:  0    Order Specific Question:  Supervising Provider    Answer:  DOOLITTLE, ROBERT P [[id number]]  . diazepam (VALIUM) 5 MG tablet    Sig: Take 1 tablet (5 mg total) by mouth every 6 (six) hours as needed for muscle spasms.    Dispense:  30 tablet    Refill:  0    Order Specific Question:  Supervising Provider    Answer:  DOOLITTLE, ROBERT P [[id number]   Printed at 104. Will bring to 102 after clinic.

## 2015-03-11 ENCOUNTER — Ambulatory Visit (INDEPENDENT_AMBULATORY_CARE_PROVIDER_SITE_OTHER): Payer: 59 | Admitting: Physician Assistant

## 2015-03-11 VITALS — BP 134/82 | HR 103 | Temp 98.7°F | Resp 18 | Ht 63.0 in | Wt 275.0 lb

## 2015-03-11 DIAGNOSIS — R748 Abnormal levels of other serum enzymes: Secondary | ICD-10-CM

## 2015-03-11 DIAGNOSIS — M62838 Other muscle spasm: Secondary | ICD-10-CM | POA: Diagnosis not present

## 2015-03-11 DIAGNOSIS — I10 Essential (primary) hypertension: Secondary | ICD-10-CM

## 2015-03-11 DIAGNOSIS — F411 Generalized anxiety disorder: Secondary | ICD-10-CM

## 2015-03-11 DIAGNOSIS — E119 Type 2 diabetes mellitus without complications: Secondary | ICD-10-CM

## 2015-03-11 DIAGNOSIS — R202 Paresthesia of skin: Secondary | ICD-10-CM

## 2015-03-11 DIAGNOSIS — F430 Acute stress reaction: Secondary | ICD-10-CM

## 2015-03-11 MED ORDER — LISINOPRIL 2.5 MG PO TABS: 2.5000 mg | ORAL_TABLET | Freq: Every day | ORAL | Status: DC

## 2015-03-11 MED ORDER — SERTRALINE HCL 100 MG PO TABS: 100.0000 mg | ORAL_TABLET | Freq: Every day | ORAL | Status: DC

## 2015-03-11 MED ORDER — MELOXICAM 15 MG PO TABS: 15.0000 mg | ORAL_TABLET | ORAL | Status: DC | PRN

## 2015-03-11 NOTE — Patient Instructions (Addendum)
Please see the Rheumatologist, Dr. Estanislado Pandy, at [facility]. The number for the office is [phone].

## 2015-03-11 NOTE — Progress Notes (Signed)
Patient ID: Emily Mathis, female    DOB: 12/02/1965, 49 y.o.   MRN: [id number]  PCP: JEFFERY,[name], PA-C   Subjective:  HPI Pt is a 49 y/o female presenting to clinic for follow-up and medication refill.   Pt is being followed by Dr. Evlyn Kanner, an endocrinologist for her post-surgical hypothyroidism and diabetes. She says the dose of her medications are still being adjusted as necessary. Her next appointment is March 25, 2015.  Pt says that she feels like her BP is doing well, although she does not monitor it at home. She is taking her medication as prescribed and tolerating it well. She says that she is not eating "as she should", but has tried to cut back on starches and does not usually add salt to her food. She says that she does not eat regularly, and can sometimes go an entire day without eating anything.  Pt states that her pain has gotten worse, but her paresthesias remain relatively consistent. She continues having pain in her neck and lower back, down her right arm and right leg, and the dorsum and lateral soles of both feet. She also experiences occasional "severe cramps" in the tops of her thighs bilaterally and in her abdominal muscles that last several minutes then go away. She does not associate the occurrence of these cramps with anything. She also continues to have numbness in both of her hands and tingling down her left arm. We were unable to see the MRI ordered by her chiropractor last month, but she reports that it showed she had a "bulging disc" in her cervical region. Pt continues to see her chiropractor. She takes flexeril and  mobic for her pain and spasms, tolerates it well, and needs a refill of the mobic today. She has not yet seen the Rheumatologist as discussed at last visit because she is too busy.   Pt does not feel like her anxiety is well controlled. She is not taking her Zoloft daily as prescribed, but rather only when she is feeling anxious. She says that she takes it  maybe 4 out of 7 days throughout the week. She has not seen a therapist as suggested at her last visit - she says she does not have the time or money for another copay.   Review of Systems  Constitutional: Positive for appetite change (decreased appetite) and fatigue. Negative for fever, chills, diaphoresis, activity change and unexpected weight change.  Respiratory: Negative.   Cardiovascular: Positive for chest pain (associated with anxiety). Negative for palpitations and leg swelling.  Gastrointestinal: Positive for abdominal pain (spasms of abdominal muscles "like having a contraction in labor"). Negative for nausea, vomiting, diarrhea, constipation, blood in stool, abdominal distention, anal bleeding and rectal pain.  Endocrine: Positive for polydipsia and polyuria. Negative for cold intolerance, heat intolerance and polyphagia.  Genitourinary: Negative.   Musculoskeletal: Positive for myalgias, back pain, arthralgias and neck pain. Negative for joint swelling.  Skin: Negative.   Neurological: Positive for weakness (from pain) and numbness (in both hands). Negative for dizziness, tremors, seizures, syncope, facial asymmetry, speech difficulty, light-headedness and headaches.       Tingling down both arms.  Hematological: Negative.   Psychiatric/Behavioral: Negative for behavioral problems, confusion, decreased concentration and agitation. The patient is nervous/anxious.      Patient Active Problem List   Diagnosis Date Noted  . Diabetes mellitus type 2, uncomplicated 09/14/2013  . Keloid scar, cervical incision 09/02/2013  . Reaction, situational, acute, to stress 07/02/2013  . Hypothyroidism,  postsurgical 04/03/2013  . Severe obesity (BMI >= 40) 03/27/2013  . GERD (gastroesophageal reflux disease) 02/15/2013  . Hypocalcemia 01/07/2013  . Chest pain on exertion 07/24/2011    Past Medical History  Diagnosis Date  . Hyperlipidemia   . Asthma   . Thyroid disease   . Arthritis     . Hypertension     per Dr. Verl Dicker note 08/30/2011  . Anxiety   . GERD (gastroesophageal reflux disease)   . Headache(784.0)   . Cancer     Pre-Cancer-Ovarian  . Diabetes mellitus without complication   . Neuromuscular disorder     Prior to Admission medications   Medication Sig Start Date End Date Taking? Authorizing Provider  albuterol (PROVENTIL HFA;VENTOLIN HFA) 108 (90 BASE) MCG/ACT inhaler Inhale 2 puffs into the lungs daily as needed. For shortness of breath   Yes Historical Provider, MD  ALPRAZolam (XANAX) 0.5 MG tablet Take 1 tablet (0.5 mg total) by mouth at bedtime as needed for sleep. 03/01/15  Yes [name] Jeffery, PA-C  Canagliflozin-Metformin HCl 150-500 MG TABS Take 1 tablet by mouth daily. 01/18/15  Yes [name] Jeffery, PA-C  chlorthalidone (HYGROTON) 25 MG tablet Take 1 tablet (25 mg total) by mouth daily. 10/18/14  Yes [name] Jeffery, PA-C  cyclobenzaprine (FLEXERIL) 10 MG tablet Take 1 tablet (10 mg total) by mouth 3 (three) times daily as needed for muscle spasms. 10/18/14  Yes [name] Jeffery, PA-C  diazepam (VALIUM) 5 MG tablet Take 1 tablet (5 mg total) by mouth every 6 (six) hours as needed for muscle spasms. 03/01/15  Yes [name] Jeffery, PA-C  levothyroxine (SYNTHROID, LEVOTHROID) 125 MCG tablet Take 250 mcg by mouth daily. 09/15/14  Yes Historical Provider, MD  meloxicam (MOBIC) 15 MG tablet Take 1 tablet (15 mg total) by mouth as needed. Patient taking differently: Take 15 mg by mouth as needed for pain.  10/18/14  Yes [name] Jeffery, PA-C  pantoprazole (PROTONIX) 40 MG tablet TAKE 1 TABLET (40 MG TOTAL) BY MOUTH 2 (TWO) TIMES DAILY. 10/18/14  Yes [name] Jeffery, PA-C  QVAR 80 MCG/ACT inhaler INHALE 2 PUFFS INTO THE LUNGS 2 (TWO) TIMES DAILY. 11/25/14  Yes [name] Jeffery, PA-C  sertraline (ZOLOFT) 50 MG tablet Take 1 tablet (50 mg total) by mouth daily. 01/18/15  Yes Porfirio Oar, PA-C    Allergies  Allergen Reactions  . Latex     Itchy & leaves marks on skin  . Dilaudid  [Hydromorphone Hcl] Itching  . Hydrocodone Itching    Past Medical, Surgical Family and Social History reviewed and updated.   Objective:   Vitals: BP 134/82 mmHg  Pulse 103  Temp(Src) 98.7 F (37.1 C) (Oral)  Resp 18  Ht 5\' 3"  (1.6 m)  Wt 275 lb (124.739 kg)  BMI 48.73 kg/m2  SpO2 97%   Physical Exam  Constitutional: She is oriented to person, place, and time. She appears well-developed and well-nourished. She is cooperative. No distress.  HENT:  Head: Normocephalic and atraumatic.  Right Ear: External ear normal.  Left Ear: External ear normal.  Eyes: Conjunctivae and lids are normal. Pupils are equal, round, and reactive to light.  Fundoscopic exam:      The right eye shows red reflex.       The left eye shows red reflex.  Neck: Trachea normal and phonation normal. Neck supple. No thyroid mass and no thyromegaly present.  Cardiovascular: Regular rhythm, normal heart sounds and intact distal pulses.  Tachycardia present.  Exam reveals no gallop and no friction  rub.   No murmur heard. Pulmonary/Chest: Effort normal and breath sounds normal. No respiratory distress. She has no wheezes. She has no rhonchi. She has no rales.  Abdominal: Soft. Bowel sounds are normal. There is no tenderness.  Musculoskeletal: She exhibits tenderness (Generalized tenderness in neck, back, sides of trunk, arms, and legs bilaterally, but more so on the right side, even to light palpation.).  Lymphadenopathy:       Head (right side): No submental, no submandibular, no tonsillar, no preauricular, no posterior auricular and no occipital adenopathy present.       Head (left side): No submental, no submandibular, no tonsillar, no preauricular, no posterior auricular and no occipital adenopathy present.    She has no cervical adenopathy.  Neurological: She is alert and oriented to person, place, and time. She has normal strength.  Skin: Skin is warm, dry and intact. No rash noted.  Psychiatric: Her  speech is normal and behavior is normal. Judgment and thought content normal. Her mood appears not anxious. Her affect is not angry, not blunt, not labile and not inappropriate. She exhibits a depressed mood.  Pt tearful when talking about stress and job.      Assessment & Plan:   Erla was seen today for follow-up, leg pain, foot pain, numbness, medication refill and depression.  Diagnoses and all orders for this visit:  GAD (generalized anxiety disorder), Reaction, situational, acute, to stress -    Pt continues to have symptoms of anxiety. -    She is tolerating the zoloft well, plan to increase dose to 100mg . -     Pt is not taking the medication daily. Informed her that this medication is not a prn medication and encouraged her to take it daily as prescribed.  -     Pt has not yet seen a therapist, which was discussed at her last visit. Continue to encourage her to see a therapist. -     Plan to write a note to pt's second employer for a 4-week leave of absence, as this second job is causing her significant stress.  Hoping that the leave of absence will give her the time to get back on her feet and do the things that she needs to do for herself to start getting better (like see a therapist and a rheumatologist).  Orders: -     sertraline (ZOLOFT) 100 MG tablet; Take 1 tablet (100 mg total) by mouth daily.  Elevated CK -    Previously elevated CK, but trending down. -    Check CK today to ensure it continues to trend down. -    Await lab result. Orders: -     CK  Paresthesias, Muscle spasms, and pain       -     Unknown etiology.       -     Elevated ANA, but normal rheumatoid factor at last visit.       -     Encouraged pt to see Rheumatology vs. Neurology for further evaluation and possible explanation for symptoms. Phone number for recommended office given to pt.  Orders: -     meloxicam (MOBIC) 15 MG tablet; Take 1 tablet (15 mg total) by mouth as needed for pain.  Severe  obesity (BMI >= 40)       -     Encouraged to continue to work on healthy diet and exercise changes.        -     Discussed  other options for her to get exercise if she is in pain.  Diabetes mellitus type 2, uncomplicated       -     Being managed by Dr. Evlyn Kanner, endocrinologist - follow his treatment plan.\       -     No need to update labs, complete work-up done at visit 1 month ago.       -     Encouraged pt to eat regularly, avoid simple sugars, check blood sugars daily       -      Addition of ACE-I for kidney protection.  Orders: -     lisinopril (PRINIVIL,ZESTRIL) 2.5 MG tablet; Take 1 tablet (2.5 mg total) by mouth daily.  Essential hypertension -    Controlled. Continue current regimen.  Kong Packett, PA-S [facility] 03/12/2015 10:38 AM

## 2015-03-12 LAB — CK: Total CK: 400 U/L — ABNORMAL HIGH (ref 7–177)

## 2015-03-12 NOTE — Progress Notes (Signed)
Patient ID: Emily Mathis, female    DOB: 15-May-1966, 49 y.o.   MRN: [id number]  PCP: Olene Floss  Subjective:   Chief Complaint  Patient presents with  . Follow-up    dm   . Leg Pain    left and right side cramping for a while now sinc surgery   . Foot Pain    left and right   . Numbness  . Medication Refill    mobic  . Depression    see screen     HPI Presents for re-evaluation of depression, since starting sertraline last month. In addition, she continues to experience paresthesias and numbness.  Pt is being followed by endocrinology for her post-surgical hypothyroidism and recent diagnosis of diabetes. She says the dose of her medications are still being adjusted as necessary. Her next appointment is March 25, 2015.   Pt says that she feels like her BP is doing well, although she does not monitor it at home. She is taking her medication as prescribed and tolerating it well. She says that she is not eating "as she should", but has tried to cut back on starches and does not usually add salt to her food. She says that she does not eat regularly, and can sometimes go an entire day without eating anything.   Pt states that her pain has gotten worse, but her paresthesias remain relatively consistent. This is an ongoing issue since her thyroidectomy several years ago. She continues having pain in her neck and lower back, down her right arm and right leg, and the dorsum and lateral soles of both feet. She also experiences occasional "severe cramps" in the tops of her thighs bilaterally and in her abdominal muscles that last several minutes then go away. She does not associate the occurrence of these cramps with anything. She also continues to have numbness in both of her hands and tingling down her left arm. We were unable to see the MRI ordered by her chiropractor last month, but she reports that it showed she had a "bulging disc" in her cervical region. Pt continues to see her  chiropractor. She takes flexeril and mobic for her pain and spasms, tolerates it well, and needs a refill of the mobic today.   She has not yet seen the Rheumatologist as discussed at last visit because she is too busy. In April CK was elevated 2872 and had been trending down to 336 at her visit here 5/03. At that time ANA was positive, but ANA titer was negative. Rheumatoid factor was negative. She had a mildly elevated ESR (36) in April.   Pt does not feel like her anxiety is well controlled. She is not taking her Zoloft daily as prescribed, but rather only when she is feeling anxious. She says that she takes it maybe 4 out of 7 days throughout the week. She has not seen a therapist as suggested at her last visit - she says she does not have the time or money for another copay.   She is working two jobs, and feels overwhelmed and stressed out. Doesn't have the time to do the things she's been advised to regarding her health care.   Review of Systems Constitutional: Positive for appetite change (decreased appetite) and fatigue. Negative for fever, chills, diaphoresis, activity change and unexpected weight change.  Respiratory: Negative.  Cardiovascular: Positive for chest pain (associated with anxiety). Negative for palpitations and leg swelling.  Gastrointestinal: Positive for abdominal pain (spasms of abdominal muscles "  like having a contraction in labor"). Negative for nausea, vomiting, diarrhea, constipation, blood in stool, abdominal distention, anal bleeding and rectal pain.  Endocrine: Positive for polydipsia and polyuria. Negative for cold intolerance, heat intolerance and polyphagia.  Genitourinary: Negative.  Musculoskeletal: Positive for myalgias, back pain, arthralgias and neck pain. Negative for joint swelling.  Skin: Negative.  Neurological: Positive for weakness (from pain) and numbness (in both hands). Negative for dizziness, tremors, seizures, syncope, facial asymmetry, speech  difficulty, light-headedness and headaches.   Tingling down both arms.  Hematological: Negative.  Psychiatric/Behavioral: Negative for behavioral problems, confusion, decreased concentration and agitation. The patient is nervous/anxious.     Patient Active Problem List   Diagnosis Date Noted  . Diabetes mellitus type 2, uncomplicated 09/14/2013  . Keloid scar, cervical incision 09/02/2013  . Reaction, situational, acute, to stress 07/02/2013  . 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 Severe obesity (BMI >= 40) 03/27/2013  . GERD (gastroesophageal reflux disease) 02/15/2013  . Hypocalcemia 01/07/2013  . Chest pain on exertion 07/24/2011     Prior to Admission medications   Medication Sig Start Date End Date Taking? Authorizing Provider  albuterol (PROVENTIL HFA;VENTOLIN HFA) 108 (90 BASE) MCG/ACT inhaler Inhale 2 puffs into the lungs daily as needed. For shortness of breath   Yes Historical Provider, MD  ALPRAZolam (XANAX) 0.5 MG tablet Take 1 tablet (0.5 mg total) by mouth at bedtime as needed for sleep. 03/01/15  Yes Nyanna Heideman, PA-C  Canagliflozin-Metformin HCl 150-500 MG TABS Take 1 tablet by mouth daily. 01/18/15  Yes Bocephus Cali, PA-C  chlorthalidone (HYGROTON) 25 MG tablet Take 1 tablet (25 mg total) by mouth daily. 10/18/14  Yes Wilmore Holsomback, PA-C  cyclobenzaprine (FLEXERIL) 10 MG tablet Take 1 tablet (10 mg total) by mouth 3 (three) times daily as needed for muscle spasms. 10/18/14  Yes Tyce Delcid, PA-C  diazepam (VALIUM) 5 MG tablet Take 1 tablet (5 mg total) by mouth every 6 (six) hours as needed for muscle spasms. 03/01/15  Yes Zakira Ressel, PA-C  levothyroxine (SYNTHROID, LEVOTHROID) 125 MCG tablet Take 250 mcg by mouth daily. 09/15/14  Yes Historical Provider, MD  meloxicam (MOBIC) 15 MG tablet Take 1 tablet (15 mg total) by mouth as needed. 03/11/15  Yes Sheppard Luckenbach, PA-C  pantoprazole (PROTONIX) 40 MG tablet TAKE 1 TABLET (40 MG TOTAL) BY MOUTH 2 (TWO)  TIMES DAILY. 10/18/14  Yes Ovila Lepage, PA-C  QVAR 80 MCG/ACT inhaler INHALE 2 PUFFS INTO THE LUNGS 2 (TWO) TIMES DAILY. 11/25/14  Yes Vannesa Abair, PA-C  sertraline (ZOLOFT) 100 MG tablet Take 1 tablet (100 mg total) by mouth daily. 03/11/15  Yes Kamorie Aldous, PA-C  lisinopril (PRINIVIL,ZESTRIL) 2.5 MG tablet Take 1 tablet (2.5 mg total) by mouth daily. 03/11/15   Porfirio Oar, PA-C     Allergies  Allergen Reactions  . Latex     Itchy & leaves marks on skin  . Dilaudid [Hydromorphone Hcl] Itching  . Hydrocodone Itching       Objective:  Physical Exam  Constitutional: She is oriented to person, place, and time. She appears well-developed and well-nourished. She is active and cooperative. No distress.  BP 134/82 mmHg  Pulse 103  Temp(Src) 98.7 F (37.1 C) (Oral)  Resp 18  Ht 5\' 3"  (1.6 m)  Wt 275 lb (124.739 kg)  BMI 48.73 kg/m2  SpO2 97%   Eyes: Conjunctivae are normal.  Pulmonary/Chest: Effort normal.  Neurological: She is alert and oriented to person, place, and time.  Psychiatric: Her speech  is normal and behavior is normal. Her mood appears anxious. Her affect is not angry, not blunt, not labile and not inappropriate. She exhibits a depressed mood. She expresses no homicidal and no suicidal ideation.  She's tearful.           Assessment & Plan:   1. GAD (generalized anxiety disorder) 2. Reaction, situational, acute, to stress Letter recommending a leave of absence from her part time job, which is the more stressful and more physical of her positions. Increase Zoloft. Encouraged her to seek counseling again. - sertraline (ZOLOFT) 100 MG tablet; Take 1 tablet (100 mg total) by mouth daily.  Dispense: 90 tablet; Refill: 1  3. Elevated CK Await repeat lab result. Expect improvement. - CK  4. Paresthesias 5. Muscle spasm Unclear etiology. Her mood disorder certainly contributes. Sounding more like fibromyalgia. Encourage her to schedule with rheumatology. If  etiology not identified there, consider neurology evaluation. Need MRI report from her chiropractor. - meloxicam (MOBIC) 15 MG tablet; Take 1 tablet (15 mg total) by mouth as needed.  Dispense: 90 tablet; Refill: 1  6. Severe obesity (BMI >= 40) Exercise is difficult due to her pain and muscle spasms and paresthesias, and also her busy schedule.  7. Diabetes mellitus type 2, uncomplicated Continue follow-up with Dr. Evlyn Kanner. - lisinopril (PRINIVIL,ZESTRIL) 2.5 MG tablet; Take 1 tablet (2.5 mg total) by mouth daily.  Dispense: 90 tablet; Refill: 3  8. Essential hypertension BP is well controlled, but add ACEI due to diabetes.   Return in about 4 weeks (around 04/08/2015) for re-evaluation of mood.   Fernande Bras, PA-C Physician Assistant-Certified [facility] .

## 2015-04-18 ENCOUNTER — Encounter (HOSPITAL_COMMUNITY): Payer: Self-pay | Admitting: Emergency Medicine

## 2015-04-18 ENCOUNTER — Emergency Department (HOSPITAL_COMMUNITY): Payer: 59

## 2015-04-18 ENCOUNTER — Emergency Department (HOSPITAL_COMMUNITY)
Admission: EM | Admit: 2015-04-18 | Discharge: 2015-04-19 | Disposition: A | Discharge status: Home / Self Care | Payer: 59 | Attending: Emergency Medicine | Admitting: Emergency Medicine

## 2015-04-18 DIAGNOSIS — Z91040 Latex allergy status: Secondary | ICD-10-CM | POA: Insufficient documentation

## 2015-04-18 DIAGNOSIS — R1013 Epigastric pain: Secondary | ICD-10-CM

## 2015-04-18 DIAGNOSIS — R079 Chest pain, unspecified: Secondary | ICD-10-CM | POA: Insufficient documentation

## 2015-04-18 DIAGNOSIS — E785 Hyperlipidemia, unspecified: Secondary | ICD-10-CM | POA: Diagnosis not present

## 2015-04-18 DIAGNOSIS — R110 Nausea: Secondary | ICD-10-CM | POA: Diagnosis not present

## 2015-04-18 DIAGNOSIS — K219 Gastro-esophageal reflux disease without esophagitis: Secondary | ICD-10-CM | POA: Diagnosis not present

## 2015-04-18 DIAGNOSIS — M1990 Unspecified osteoarthritis, unspecified site: Secondary | ICD-10-CM | POA: Insufficient documentation

## 2015-04-18 DIAGNOSIS — E119 Type 2 diabetes mellitus without complications: Secondary | ICD-10-CM | POA: Diagnosis not present

## 2015-04-18 DIAGNOSIS — Z79899 Other long term (current) drug therapy: Secondary | ICD-10-CM | POA: Insufficient documentation

## 2015-04-18 DIAGNOSIS — Z8543 Personal history of malignant neoplasm of ovary: Secondary | ICD-10-CM | POA: Insufficient documentation

## 2015-04-18 DIAGNOSIS — F419 Anxiety disorder, unspecified: Secondary | ICD-10-CM | POA: Insufficient documentation

## 2015-04-18 DIAGNOSIS — R51 Headache: Secondary | ICD-10-CM | POA: Diagnosis not present

## 2015-04-18 DIAGNOSIS — J45909 Unspecified asthma, uncomplicated: Secondary | ICD-10-CM | POA: Diagnosis not present

## 2015-04-18 DIAGNOSIS — I10 Essential (primary) hypertension: Secondary | ICD-10-CM | POA: Insufficient documentation

## 2015-04-18 LAB — CBC
HCT: 32.4 % — ABNORMAL LOW (ref 36.0–46.0)
Hemoglobin: 10 g/dL — ABNORMAL LOW (ref 12.0–15.0)
MCH: 24.2 pg — ABNORMAL LOW (ref 26.0–34.0)
MCHC: 30.9 g/dL (ref 30.0–36.0)
MCV: 78.5 fL (ref 78.0–100.0)
Platelets: 648 10*3/uL — ABNORMAL HIGH (ref 150–400)
RBC: 4.13 MIL/uL (ref 3.87–5.11)
RDW: 16.7 % — ABNORMAL HIGH (ref 11.5–15.5)
WBC: 11.6 10*3/uL — ABNORMAL HIGH (ref 4.0–10.5)

## 2015-04-18 LAB — I-STAT TROPONIN, ED: TROPONIN I, POC: 0 ng/mL (ref 0.00–0.08)

## 2015-04-18 LAB — BASIC METABOLIC PANEL
ANION GAP: 9 (ref 5–15)
BUN: 10 mg/dL (ref 6–20)
CO2: 25 mmol/L (ref 22–32)
Calcium: 8.1 mg/dL — ABNORMAL LOW (ref 8.9–10.3)
Chloride: 102 mmol/L (ref 101–111)
Creatinine, Ser: 0.97 mg/dL (ref 0.44–1.00)
GFR calc Af Amer: >60 mL/min (ref >60)
GFR calc non Af Amer: >60 mL/min (ref >60)
GLUCOSE: 99 mg/dL (ref 65–99)
Potassium: 3 mmol/L — ABNORMAL LOW (ref 3.5–5.1)
Sodium: 136 mmol/L (ref 135–145)

## 2015-04-18 MED ORDER — DIPHENHYDRAMINE HCL 50 MG/ML IJ SOLN
12.5000 mg | Freq: Once | INTRAMUSCULAR | Status: AC
  Administered 2015-04-19: 12.5 mg via INTRAVENOUS
  Filled 2015-04-18: qty 1

## 2015-04-18 MED ORDER — SODIUM CHLORIDE 0.9 % IV BOLUS (SEPSIS)
1000.0000 mL | Freq: Once | INTRAVENOUS | Status: AC
  Administered 2015-04-19: 1000 mL via INTRAVENOUS

## 2015-04-18 MED ORDER — FENTANYL CITRATE (PF) 100 MCG/2ML IJ SOLN
50.0000 ug | Freq: Once | INTRAMUSCULAR | Status: AC
  Administered 2015-04-19: 50 ug via INTRAVENOUS
  Filled 2015-04-18: qty 2

## 2015-04-18 MED ORDER — GI COCKTAIL ~~LOC~~
30.0000 mL | Freq: Once | ORAL | Status: AC
  Administered 2015-04-19: 30 mL via ORAL
  Filled 2015-04-18: qty 30

## 2015-04-18 MED ORDER — METOCLOPRAMIDE HCL 5 MG/ML IJ SOLN
10.0000 mg | Freq: Once | INTRAMUSCULAR | Status: AC
  Administered 2015-04-19: 10 mg via INTRAVENOUS
  Filled 2015-04-18: qty 2

## 2015-04-18 NOTE — ED Notes (Signed)
Pt. reports central chest pain with SOB ,nausea and diaphoresis onset yesterday morning .

## 2015-04-18 NOTE — ED Provider Notes (Signed)
CSN: [id number]     Arrival date & time 04/18/15  2128 History  This chart was scribed for Linton Flemings, MD by Hansel Feinstein, ED Scribe. This patient was seen in room D34C/D34C and the patient's care was started at 11:38 PM.     Chief Complaint  Patient presents with  . Chest Pain   The history is provided by the patient. No language interpreter was used.    HPI Comments: Carmel Garfield is a 49 y.o. female with Hx of HLD, asthma, thyroid disease, HTN, GERD, DM who presents to the Emergency Department complaining of moderate, gradually worsening constant central CP described as squeezing onset yesterday morning. Pt states associated HA and low blood sugar that began 2 days ago, nausea, abdominal pain, difficulty sleeping. Pt states that pain is worsened by breathing. She notes that she took Tylenol, Tramadol and Protonix with mild relief. She notes that her pervious cardiac catheterization had no complications. She also notes 2 caesarian sections. Pt is followed by PCP Dr. Jacqulynn Cadet. She denies emesis.   Past Medical History  Diagnosis Date  . Hyperlipidemia   . Asthma   . Thyroid disease   . Arthritis   . Hypertension     per Dr. Irven Shelling note 08/30/2011  . Anxiety   . GERD (gastroesophageal reflux disease)   . Headache(784.0)   . Cancer     Pre-Cancer-Ovarian  . Diabetes mellitus without complication   . Neuromuscular disorder    Past Surgical History  Procedure Laterality Date  . Cesarean section  1995  . Cesarean section  1996  . Cardiac catheterization  2013  . Thyroidectomy N/A 12/18/2012    Procedure: TOTAL THYROIDECTOMY;  Surgeon: Earnstine Regal, MD;  Location: [facility] ORS;  Service: General;  Laterality: N/A;  . Left heart catheterization with coronary angiogram  07/24/2011    Procedure: LEFT HEART CATHETERIZATION WITH CORONARY ANGIOGRAM;  Surgeon: Laverda Page, MD;  Location: [facility] CATH LAB;  Service: Cardiovascular;;   Family History  Problem Relation Age of Onset  . Heart disease  Mother   . Stroke Mother   . Hypertension Mother   . Heart disease Father   . Colon cancer Father   . Hypertension Father   . Breast cancer Paternal Aunt   . Cancer Paternal Grandmother   . Cancer Paternal Aunt   . Asthma Daughter   . Seizures Daughter   . Hypertension Sister   . Mental illness Daughter 87    suicide attempt 06/2013   History  Substance Use Topics  . Smoking status: Never Smoker   . Smokeless tobacco: Never Used  . Alcohol Use: Yes     Comment: Occasional   OB History    No data available     Review of Systems  Cardiovascular: Positive for chest pain.  Gastrointestinal: Positive for nausea and abdominal pain. Negative for vomiting.  Neurological: Positive for headaches.  Psychiatric/Behavioral: Positive for sleep disturbance.  All other systems reviewed and are negative.  Allergies  Latex; Dilaudid; and Hydrocodone  Home Medications   Prior to Admission medications   Medication Sig Start Date End Date Taking? Authorizing Provider  albuterol (PROVENTIL HFA;VENTOLIN HFA) 108 (90 BASE) MCG/ACT inhaler Inhale 2 puffs into the lungs daily as needed. For shortness of breath   Yes Historical Provider, MD  ALPRAZolam (XANAX) 0.5 MG tablet Take 1 tablet (0.5 mg total) by mouth at bedtime as needed for sleep. 03/01/15  Yes [name] Jeffery, PA-C  Canagliflozin-Metformin HCl 150-500 MG TABS  Take 1 tablet by mouth daily. 01/18/15  Yes [name] Jeffery, PA-C  chlorthalidone (HYGROTON) 25 MG tablet Take 1 tablet (25 mg total) by mouth daily. 10/18/14  Yes [name] Jeffery, PA-C  diazepam (VALIUM) 5 MG tablet Take 1 tablet (5 mg total) by mouth every 6 (six) hours as needed for muscle spasms. 03/01/15  Yes [name] Jeffery, PA-C  levothyroxine (SYNTHROID, LEVOTHROID) 125 MCG tablet Take 250 mcg by mouth daily. 09/15/14  Yes Historical Provider, MD  lisinopril (PRINIVIL,ZESTRIL) 2.5 MG tablet Take 1 tablet (2.5 mg total) by mouth daily. 03/11/15  Yes [name] Jeffery, PA-C   pantoprazole (PROTONIX) 40 MG tablet TAKE 1 TABLET (40 MG TOTAL) BY MOUTH 2 (TWO) TIMES DAILY. 10/18/14  Yes [name] Jeffery, PA-C  QVAR 80 MCG/ACT inhaler INHALE 2 PUFFS INTO THE LUNGS 2 (TWO) TIMES DAILY. 11/25/14  Yes [name] Jeffery, PA-C  sertraline (ZOLOFT) 100 MG tablet Take 1 tablet (100 mg total) by mouth daily. 03/11/15  Yes [name] Jeffery, PA-C  cyclobenzaprine (FLEXERIL) 10 MG tablet Take 1 tablet (10 mg total) by mouth 3 (three) times daily as needed for muscle spasms. Patient not taking: Reported on 04/18/2015 10/18/14   Harrison Mons, PA-C  meloxicam (MOBIC) 15 MG tablet Take 1 tablet (15 mg total) by mouth as needed. Patient not taking: Reported on 04/18/2015 03/11/15   [name] Jeffery, PA-C   BP 133/70 mmHg  Pulse 82  Temp(Src) 98.6 F (37 C) (Oral)  Resp 17  SpO2 97% Physical Exam  Constitutional: She is oriented to person, place, and time. She appears well-developed and well-nourished.  HENT:  Head: Normocephalic and atraumatic.  Nose: Nose normal.  Mouth/Throat: Oropharynx is clear and moist.  Eyes: Conjunctivae and EOM are normal. Pupils are equal, round, and reactive to light.  Neck: Normal range of motion. Neck supple. No JVD present. No tracheal deviation present. No thyromegaly present.  Cardiovascular: Normal rate, regular rhythm, normal heart sounds and intact distal pulses.  Exam reveals no gallop and no friction rub.   No murmur heard. Pulmonary/Chest: Effort normal and breath sounds normal. No stridor. No respiratory distress. She has no wheezes. She has no rales. She exhibits no tenderness.  Abdominal: Soft. Bowel sounds are normal. She exhibits no distension and no mass. There is no tenderness. There is no rebound and no guarding.  Musculoskeletal: Normal range of motion. She exhibits no edema or tenderness.  Lymphadenopathy:    She has no cervical adenopathy.  Neurological: She is alert and oriented to person, place, and time. She displays normal reflexes. She  exhibits normal muscle tone. Coordination normal.  Skin: Skin is warm and dry. No rash noted. No erythema. No pallor.  Psychiatric: She has a normal mood and affect. Her behavior is normal. Judgment and thought content normal.  Nursing note and vitals reviewed.   ED Course  Procedures (including critical care time) DIAGNOSTIC STUDIES: Oxygen Saturation is 97% on RA, normal by my interpretation.    COORDINATION OF CARE: 11:45 PM Discussed treatment plan with pt at bedside and pt agreed to plan.   Labs Review Labs Reviewed  BASIC METABOLIC PANEL - Abnormal; Notable for the following:    Potassium 3.0 (*)    Calcium 8.1 (*)    All other components within normal limits  CBC - Abnormal; Notable for the following:    WBC 11.6 (*)    Hemoglobin 10.0 (*)    HCT 32.4 (*)    MCH 24.2 (*)    RDW 16.7 (*)  Platelets 648 (*)    All other components within normal limits  LIPASE, BLOOD - Abnormal; Notable for the following:    Lipase 20 (*)    All other components within normal limits  HEPATIC FUNCTION PANEL - Abnormal; Notable for the following:    Total Protein 8.2 (*)    ALT 13 (*)    Bilirubin, Direct <0.1 (*)    All other components within normal limits  I-STAT TROPOININ, ED    Imaging Review Dg Chest 2 View  04/18/2015   CLINICAL DATA:  Chest pain for 1 day  EXAM: CHEST  2 VIEW  COMPARISON:  05/11/2014  FINDINGS: The heart size and mediastinal contours are within normal limits. Both lungs are clear. The visualized skeletal structures are unremarkable.  IMPRESSION: No active cardiopulmonary disease.   Electronically Signed   By: Andreas Newport M.D.   On: 04/18/2015 22:15     EKG Interpretation   Date/Time:  Monday April 18 2015 21:36:55 EDT Ventricular Rate:  99 PR Interval:  150 QRS Duration: 72 QT Interval:  346 QTC Calculation: 444 R Axis:   67 Text Interpretation:  Normal sinus rhythm ST \\T \ T wave abnormality,  consider inferolateral ischemia Abnormal ECG since  last tracing no  significant change Confirmed by MILLER  MD, BRIAN ([id number]) on 04/18/2015  10:20:11 PM      MDM   Final diagnoses:  Epigastric pain  Chest pain with low risk for cardiac etiology   I personally performed the services described in this documentation, which was scribed in my presence. The recorded information has been reviewed and is accurate.  49 year old female, history of hypertension, diabetes who presents with chest pain constant for 2 days.  No signs of ischemia.  Patient feeling better after GI cocktail.  Patient to follow-up with primary care doctor.  Linton Flemings, MD 04/19/15 [phone]

## 2015-04-19 LAB — HEPATIC FUNCTION PANEL
ALBUMIN: 3.6 g/dL (ref 3.5–5.0)
ALK PHOS: 64 U/L (ref 38–126)
ALT: 13 U/L — ABNORMAL LOW (ref 14–54)
AST: 20 U/L (ref 15–41)
Bilirubin, Direct: <0.1 mg/dL — ABNORMAL LOW (ref 0.1–0.5)
Total Bilirubin: 0.4 mg/dL (ref 0.3–1.2)
Total Protein: 8.2 g/dL — ABNORMAL HIGH (ref 6.5–8.1)

## 2015-04-19 LAB — LIPASE, BLOOD: Lipase: 20 U/L — ABNORMAL LOW (ref 22–51)

## 2015-04-19 MED ORDER — SUCRALFATE 1 G PO TABS: 1.0000 g | ORAL_TABLET | Freq: Four times a day (QID) | ORAL | Status: DC

## 2015-04-19 NOTE — Discharge Instructions (Signed)
Chest Pain Observation  It is often hard to give a specific diagnosis for the cause of chest pain. Among other possibilities your symptoms might be caused by inadequate oxygen delivery to your heart (angina). Angina that is not treated or evaluated can lead to a heart attack (myocardial infarction) or death.  Blood tests, electrocardiograms, and X-rays may have been done to help determine a possible cause of your chest pain. After evaluation and observation, your health care provider has determined that it is unlikely your pain was caused by an unstable condition that requires hospitalization. However, a full evaluation of your pain may need to be completed, with additional diagnostic testing as directed. It is very important to keep your follow-up appointments. Not keeping your follow-up appointments could result in permanent heart damage, disability, or death. If there is any problem keeping your follow-up appointments, you must call your health care provider.  HOME CARE INSTRUCTIONS   Due to the slight chance that your pain could be angina, it is important to follow your health care provider's treatment plan and also maintain a healthy lifestyle:  · Maintain or work toward achieving a healthy weight.  · Stay physically active and exercise regularly.  · Decrease your salt intake.  · Eat a balanced, healthy diet. Talk to a dietitian to learn about heart-healthy foods.  · Increase your fiber intake by including whole grains, vegetables, fruits, and nuts in your diet.  · Avoid situations that cause stress, anger, or depression.  · Take medicines as advised by your health care provider. Report any side effects to your health care provider. Do not stop medicines or adjust the dosages on your own.  · Quit smoking. Do not use nicotine patches or gum until you check with your health care provider.  · Keep your blood pressure, blood sugar, and cholesterol levels within normal limits.  · Limit alcohol intake to no more than  1 drink per day for women who are not pregnant and 2 drinks per day for men.  · Do not abuse drugs.  SEEK IMMEDIATE MEDICAL CARE IF:  You have severe chest pain or pressure which may include symptoms such as:  · You feel pain or pressure in your arms, neck, jaw, or back.  · You have severe back or abdominal pain, feel sick to your stomach (nauseous), or throw up (vomit).  · You are sweating profusely.  · You are having a fast or irregular heartbeat.  · You feel short of breath while at rest.  · You notice increasing shortness of breath during rest, sleep, or with activity.  · You have chest pain that does not get better after rest or after taking your usual medicine.  · You wake from sleep with chest pain.  · You are unable to sleep because you cannot breathe.  · You develop a frequent cough or you are coughing up blood.  · You feel dizzy, faint, or experience extreme fatigue.  · You develop severe weakness, dizziness, fainting, or chills.  Any of these symptoms may represent a serious problem that is an emergency. Do not wait to see if the symptoms will go away. Call your local emergency services (911 in the [place] Do not drive yourself to the hospital.  MAKE SURE YOU:  · Understand these instructions.  · Will watch your condition.  · Will get help right away if you are not doing well or get worse.  Document Released: 10/06/2010 Document Revised: 09/08/2013 Document Reviewed: 03/05/2013    Exit[employer]® Patient Information ©2015 [employer]. This information is not intended to replace advice given to you by your health care provider. Make sure you discuss any questions you have with your health care provider.

## 2015-04-19 NOTE — ED Notes (Signed)
Patient is alert and orientedx4.  Patient was explained discharge instructions and they understood them with no questions.  The patient's daughter, Emily Mathis is taking the patient home.

## 2015-05-06 ENCOUNTER — Encounter: Payer: Self-pay | Admitting: Physician Assistant

## 2015-05-06 NOTE — Telephone Encounter (Signed)
[name], I changed pt's pharm in EPIC and forwarding to you FYI

## 2015-05-27 ENCOUNTER — Telehealth: Payer: Self-pay | Admitting: Physician Assistant

## 2015-05-27 DIAGNOSIS — R768 Other specified abnormal immunological findings in serum: Secondary | ICD-10-CM

## 2015-05-27 DIAGNOSIS — R202 Paresthesia of skin: Secondary | ICD-10-CM

## 2015-05-27 DIAGNOSIS — F411 Generalized anxiety disorder: Secondary | ICD-10-CM

## 2015-05-27 DIAGNOSIS — M5441 Lumbago with sciatica, right side: Secondary | ICD-10-CM

## 2015-05-27 DIAGNOSIS — M542 Cervicalgia: Secondary | ICD-10-CM

## 2015-05-30 MED ORDER — ALPRAZOLAM 0.5 MG PO TABS
0.5000 mg | ORAL_TABLET | Freq: Every evening | ORAL | Status: DC | PRN
Start: 2015-05-30 — End: 2015-08-20

## 2015-05-30 MED ORDER — DIAZEPAM 5 MG PO TABS: 5.0000 mg | ORAL_TABLET | Freq: Four times a day (QID) | ORAL | Status: DC | PRN

## 2015-05-30 NOTE — Telephone Encounter (Signed)
Patient notified via My Chart.  Meds ordered this encounter  Medications  . ALPRAZolam (XANAX) 0.5 MG tablet    Sig: Take 1 tablet (0.5 mg total) by mouth at bedtime as needed for sleep.    Dispense:  30 tablet    Refill:  0    Order Specific Question:  Supervising Provider    Answer:  DOOLITTLE, ROBERT P [[id number]]  . diazepam (VALIUM) 5 MG tablet    Sig: Take 1 tablet (5 mg total) by mouth every 6 (six) hours as needed for muscle spasms.    Dispense:  30 tablet    Refill:  0    Order Specific Question:  Supervising Provider    Answer:  DOOLITTLE, ROBERT P [[id number]]

## 2015-05-30 NOTE — Addendum Note (Signed)
Addended by: Fara Chute on: 05/30/2015 09:05 AM   Modules accepted: Orders

## 2015-05-31 NOTE — Telephone Encounter (Signed)
Faxed RFs.

## 2015-06-08 ENCOUNTER — Telehealth: Payer: Self-pay | Admitting: Physician Assistant

## 2015-06-08 DIAGNOSIS — R768 Other specified abnormal immunological findings in serum: Secondary | ICD-10-CM

## 2015-06-08 NOTE — Telephone Encounter (Signed)
Patient has [facility] and initial referral was authorized for only one visit.  Needs a new referral for more visits. Next appointment is with Marella Chimes, PA-C on 06/28/2015.

## 2015-06-15 LAB — TSH: TSH: 17.96 u[IU]/mL — AB (ref 0.41–5.90)

## 2015-06-16 ENCOUNTER — Encounter: Payer: Self-pay | Admitting: Physician Assistant

## 2015-06-16 ENCOUNTER — Telehealth: Payer: Self-pay | Admitting: Physician Assistant

## 2015-06-16 DIAGNOSIS — M797 Fibromyalgia: Secondary | ICD-10-CM | POA: Insufficient documentation

## 2015-06-16 DIAGNOSIS — M2550 Pain in unspecified joint: Secondary | ICD-10-CM

## 2015-06-16 LAB — CK: CK TOTAL: 201 U/L — AB (ref ?–170.0)

## 2015-06-16 NOTE — Telephone Encounter (Signed)
Please call this patient. The thyroid test that they did at rheumatology is elevated. Is she taking the synthroid EVERY SINGLE DAY? If she is, her dose needs to be adjusted. They may do that at rheumatology, if not, either I need to or your endocrinologist needs to do it. This could contribute to your muscle pains.

## 2015-06-17 MED ORDER — LEVOTHYROXINE SODIUM 300 MCG PO TABS: 300.0000 ug | ORAL_TABLET | Freq: Every day | ORAL | Status: AC

## 2015-06-17 NOTE — Telephone Encounter (Signed)
Meds ordered this encounter  Medications  . levothyroxine (SYNTHROID, LEVOTHROID) 300 MCG tablet    Sig: Take 1 tablet (300 mcg total) by mouth daily.    Dispense:  90 tablet    Refill:  3    Order Specific Question:  Supervising Provider    Answer:  Laney Pastor, ROBERT P [[id number]]    Please let this patient know that I've sent the new Rx in, and we need to recheck the level in 6 weeks.

## 2015-06-17 NOTE — Telephone Encounter (Signed)
Spoke with pt, she is taking her Synthroid everyday and she would like [name] to adjust the dose for her.

## 2015-06-18 NOTE — Telephone Encounter (Signed)
Spoke with pt, advised message from [name]. Pt understood. 

## 2015-07-08 ENCOUNTER — Encounter: Payer: Self-pay | Admitting: Physician Assistant

## 2015-07-08 DIAGNOSIS — M2550 Pain in unspecified joint: Secondary | ICD-10-CM

## 2015-08-20 ENCOUNTER — Other Ambulatory Visit: Payer: Self-pay | Admitting: Physician Assistant

## 2015-08-20 DIAGNOSIS — I10 Essential (primary) hypertension: Secondary | ICD-10-CM

## 2015-08-22 NOTE — Telephone Encounter (Signed)
As a reminder, she isn't to use these both together. How is she using them?  When/how often?

## 2015-08-23 MED ORDER — CHLORTHALIDONE 25 MG PO TABS: 25.0000 mg | ORAL_TABLET | Freq: Every day | ORAL | Status: DC

## 2015-08-23 NOTE — Telephone Encounter (Signed)
Rx printed at 104. Will bring to 102 after clinic.  Meds ordered this encounter  Medications  . ALPRAZolam (XANAX) 0.5 MG tablet    Sig: TAKE 1 TABLET BY MOUTH AT BEDTIME AS NEEDED FOR SLEEP    Dispense:  30 tablet    Refill:  0  . diazepam (VALIUM) 5 MG tablet    Sig: TAKE 1 TABLET BY MOUTH EVERY 6 HOURS AS NEEDED FOR MUSCLE SPASMS    Dispense:  30 tablet    Refill:  0  . chlorthalidone (HYGROTON) 25 MG tablet    Sig: Take 1 tablet (25 mg total) by mouth daily.    Dispense:  90 tablet    Refill:  0

## 2015-08-23 NOTE — Telephone Encounter (Signed)
Spoke to pt who reported that she never takes them together. She uses the alprazolam only at night if she has trouble sleeping. She hasn't used the diazepam much at all recently, and takes it for muscle spasms. Pt reports the spasms are not as frequent. Pt is just trying to get RFs on everything she needs because her ins may lapse at beginning of year when she changes ins. I discussed need for f/up before then and she agreed that she needs to make appt or walk in this month. Pt asked if she is supposed to be taking both chlorthalidone and lisinopril, she has only been taking lisinopril. I checked OV notes, and all others since, and it appears that [facility] does want her on both. Pt requests a 90 day supply of chlorthalidone also to restart. Pended for approval.

## 2015-08-23 NOTE — Telephone Encounter (Signed)
Faxed controlled Rxs.

## 2015-09-23 ENCOUNTER — Other Ambulatory Visit: Payer: Self-pay | Admitting: Physician Assistant

## 2015-09-27 ENCOUNTER — Emergency Department (HOSPITAL_COMMUNITY): Payer: BLUE CROSS/BLUE SHIELD

## 2015-09-27 ENCOUNTER — Encounter (HOSPITAL_COMMUNITY): Payer: Self-pay | Admitting: Emergency Medicine

## 2015-09-27 ENCOUNTER — Emergency Department (HOSPITAL_COMMUNITY)
Admission: EM | Admit: 2015-09-27 | Discharge: 2015-09-28 | Disposition: A | Discharge status: Home / Self Care | Payer: BLUE CROSS/BLUE SHIELD | Attending: Emergency Medicine | Admitting: Emergency Medicine

## 2015-09-27 DIAGNOSIS — E079 Disorder of thyroid, unspecified: Secondary | ICD-10-CM | POA: Insufficient documentation

## 2015-09-27 DIAGNOSIS — Z91040 Latex allergy status: Secondary | ICD-10-CM | POA: Diagnosis not present

## 2015-09-27 DIAGNOSIS — I10 Essential (primary) hypertension: Secondary | ICD-10-CM | POA: Insufficient documentation

## 2015-09-27 DIAGNOSIS — J45909 Unspecified asthma, uncomplicated: Secondary | ICD-10-CM | POA: Diagnosis not present

## 2015-09-27 DIAGNOSIS — D649 Anemia, unspecified: Secondary | ICD-10-CM | POA: Insufficient documentation

## 2015-09-27 DIAGNOSIS — Z8669 Personal history of other diseases of the nervous system and sense organs: Secondary | ICD-10-CM | POA: Insufficient documentation

## 2015-09-27 DIAGNOSIS — R0789 Other chest pain: Secondary | ICD-10-CM | POA: Insufficient documentation

## 2015-09-27 DIAGNOSIS — Z7951 Long term (current) use of inhaled steroids: Secondary | ICD-10-CM | POA: Insufficient documentation

## 2015-09-27 DIAGNOSIS — E663 Overweight: Secondary | ICD-10-CM | POA: Diagnosis not present

## 2015-09-27 DIAGNOSIS — Z7984 Long term (current) use of oral hypoglycemic drugs: Secondary | ICD-10-CM | POA: Diagnosis not present

## 2015-09-27 DIAGNOSIS — E119 Type 2 diabetes mellitus without complications: Secondary | ICD-10-CM | POA: Diagnosis not present

## 2015-09-27 DIAGNOSIS — R079 Chest pain, unspecified: Secondary | ICD-10-CM | POA: Diagnosis present

## 2015-09-27 DIAGNOSIS — Z9889 Other specified postprocedural states: Secondary | ICD-10-CM | POA: Diagnosis not present

## 2015-09-27 DIAGNOSIS — M1990 Unspecified osteoarthritis, unspecified site: Secondary | ICD-10-CM | POA: Diagnosis not present

## 2015-09-27 DIAGNOSIS — Z859 Personal history of malignant neoplasm, unspecified: Secondary | ICD-10-CM | POA: Diagnosis not present

## 2015-09-27 DIAGNOSIS — M7989 Other specified soft tissue disorders: Secondary | ICD-10-CM | POA: Insufficient documentation

## 2015-09-27 DIAGNOSIS — F419 Anxiety disorder, unspecified: Secondary | ICD-10-CM | POA: Insufficient documentation

## 2015-09-27 DIAGNOSIS — Z79899 Other long term (current) drug therapy: Secondary | ICD-10-CM | POA: Insufficient documentation

## 2015-09-27 DIAGNOSIS — K219 Gastro-esophageal reflux disease without esophagitis: Secondary | ICD-10-CM | POA: Diagnosis not present

## 2015-09-27 LAB — BASIC METABOLIC PANEL
Anion gap: 10 (ref 5–15)
BUN: 12 mg/dL (ref 6–20)
CHLORIDE: 104 mmol/L (ref 101–111)
CO2: 28 mmol/L (ref 22–32)
Calcium: 8.3 mg/dL — ABNORMAL LOW (ref 8.9–10.3)
Creatinine, Ser: 0.76 mg/dL (ref 0.44–1.00)
GFR calc Af Amer: >60 mL/min (ref >60)
GFR calc non Af Amer: >60 mL/min (ref >60)
Glucose, Bld: 114 mg/dL — ABNORMAL HIGH (ref 65–99)
Potassium: 3.4 mmol/L — ABNORMAL LOW (ref 3.5–5.1)
Sodium: 142 mmol/L (ref 135–145)

## 2015-09-27 LAB — CBC
HCT: 29.5 % — ABNORMAL LOW (ref 36.0–46.0)
Hemoglobin: 8.1 g/dL — ABNORMAL LOW (ref 12.0–15.0)
MCH: 19.5 pg — AB (ref 26.0–34.0)
MCHC: 27.5 g/dL — ABNORMAL LOW (ref 30.0–36.0)
MCV: 70.9 fL — AB (ref 78.0–100.0)
PLATELETS: 693 10*3/uL — AB (ref 150–400)
RBC: 4.16 MIL/uL (ref 3.87–5.11)
RDW: 19.2 % — AB (ref 11.5–15.5)
WBC: 10.1 10*3/uL (ref 4.0–10.5)

## 2015-09-27 LAB — I-STAT TROPONIN, ED: Troponin i, poc: 0 ng/mL (ref 0.00–0.08)

## 2015-09-27 NOTE — ED Provider Notes (Signed)
CSN: [id number]     Arrival date & time 09/27/15  1826 History  By signing my name below, I, Evelene Croon, attest that this documentation has been prepared under the direction and in the presence of Merryl Hacker, MD . Electronically Signed: Evelene Croon, Scribe. 09/28/2015. 2:05 AM.    Chief Complaint  Patient presents with  . Chest Pain    The history is provided by the patient. No language interpreter was used.     HPI Comments:  Emily Mathis is a 50 y.o. female with a history of HLD, HTN, GERD and DM, who presents to the Emergency Department complaining of 10/10, stabbing, central, CP x ~ 1 week; worse today. She notes she was evaluated by EMS this AM as she was hyperventilating secondary to the pain; advised to rest. Her pain is worse when supine and with deep inspiration. She has been taking tramadol without relief. She reports associated mild dry cough and chronic BLE edema that has improved. She denies recent hospitalizations/surgeries, h/o blood clots, use of BCP/hormone supplements. She also denies smoking hx. 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 Denies obvious injury.  Past Medical History  Diagnosis Date  . Hyperlipidemia   . Asthma   . Thyroid disease   . Arthritis   . Hypertension     per Dr. Irven Shelling note 08/30/2011  . Anxiety   . GERD (gastroesophageal reflux disease)   . Headache(784.0)   . 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 Diabetes mellitus without complication ([place])   . Neuromuscular disorder [facility])    Past Surgical History  Procedure Laterality Date  . Cesarean section  1995  . Cesarean section  1996  . Cardiac catheterization  2013  . Thyroidectomy N/A 12/18/2012    Procedure: TOTAL THYROIDECTOMY;  Surgeon: Earnstine Regal, MD;  Location: [facility] ORS;  Service: General;  Laterality: N/A;  . Left heart catheterization with coronary angiogram  07/24/2011    Procedure: LEFT HEART CATHETERIZATION WITH CORONARY ANGIOGRAM;  Surgeon:  Laverda Page, MD;  Location: [facility] CATH LAB;  Service: Cardiovascular;;   Family History  Problem Relation Age of Onset  . Heart disease Mother   . Stroke Mother   . Hypertension Mother   . Heart disease Father   . Colon cancer Father   . Hypertension Father   . Breast cancer Paternal Aunt   . Cancer Paternal Grandmother   . Cancer Paternal Aunt   . Asthma Daughter   . Seizures Daughter   . Hypertension Sister   . Mental illness Daughter 93    suicide attempt 06/2013   Social History  Substance Use Topics  . Smoking status: Never Smoker   . Smokeless tobacco: Never Used  . Alcohol Use: Yes     Comment: Occasional   OB History    No data available     Review of Systems  Constitutional: Negative for fever.  Respiratory: Positive for cough. Negative for shortness of breath.   Cardiovascular: Positive for chest pain and leg swelling.  Gastrointestinal: Negative for nausea, vomiting and abdominal pain.  Skin: Negative for rash.  All other systems reviewed and are negative.   Allergies  Latex; Dilaudid; and Hydrocodone  Home Medications   Prior to Admission medications   Medication Sig Start Date End Date Taking? Authorizing Provider  albuterol (PROVENTIL HFA;VENTOLIN HFA) 108 (90 BASE) MCG/ACT inhaler Inhale 2 puffs into the lungs daily as needed. For shortness of  breath   Yes Historical Provider, MD  ALPRAZolam Duanne Moron) 0.5 MG tablet TAKE 1 TABLET BY MOUTH AT BEDTIME AS NEEDED FOR SLEEP 08/23/15  Yes [name] Jeffery, PA-C  chlorthalidone (HYGROTON) 25 MG tablet Take 1 tablet (25 mg total) by mouth daily. 08/23/15  Yes [name] Jeffery, PA-C  diazepam (VALIUM) 5 MG tablet TAKE 1 TABLET BY MOUTH EVERY 6 HOURS AS NEEDED FOR MUSCLE SPASMS 08/23/15  Yes [name] Jeffery, PA-C  INVOKAMET 150-500 MG TABS TAKE 1 TABLET BY MOUTH ONCE DAILY 09/26/15  Yes [name] Jeffery, PA-C  levothyroxine (SYNTHROID, LEVOTHROID) 300 MCG tablet Take 1 tablet (300 mcg total) by mouth daily. 06/17/15  Yes  [name] Jeffery, PA-C  lisinopril (PRINIVIL,ZESTRIL) 2.5 MG tablet Take 1 tablet (2.5 mg total) by mouth daily. 03/11/15  Yes [name] Jeffery, PA-C  pantoprazole (PROTONIX) 40 MG tablet TAKE 1 TABLET (40 MG TOTAL) BY MOUTH 2 (TWO) TIMES DAILY. 10/18/14  Yes [name] Jeffery, PA-C  QVAR 80 MCG/ACT inhaler INHALE 2 PUFFS INTO THE LUNGS 2 (TWO) TIMES DAILY. 11/25/14  Yes [name] Jeffery, PA-C  sertraline (ZOLOFT) 100 MG tablet Take 1 tablet (100 mg total) by mouth daily. 03/11/15  Yes [name] Jeffery, PA-C  sucralfate (CARAFATE) 1 G tablet Take 1 tablet (1 g total) by mouth 4 (four) times daily. 04/19/15  Yes Linton Flemings, MD  traMADol (ULTRAM) 50 MG tablet Take 50 mg by mouth every 6 (six) hours as needed for moderate pain.   Yes Historical Provider, MD  naproxen (NAPROSYN) 500 MG tablet Take 1 tablet (500 mg total) by mouth 2 (two) times daily. 09/28/15   Merryl Hacker, MD  oxyCODONE-acetaminophen (PERCOCET/ROXICET) 5-325 MG tablet Take 2 tablets by mouth every 6 (six) hours as needed for severe pain. 09/28/15   Merryl Hacker, MD   BP 115/76 mmHg  Pulse 74  Temp(Src) 98.2 F (36.8 C) (Oral)  Resp 17  SpO2 100%  LMP  Physical Exam  Constitutional: She is oriented to person, place, and time. She appears well-developed and well-nourished.  Uncomfortable appearing, overweight, no acute distress  HENT:  Head: Normocephalic and atraumatic.  Cardiovascular: Normal rate, regular rhythm and normal heart sounds.   Pulmonary/Chest: Effort normal. No respiratory distress. She has no wheezes. She exhibits tenderness.  Abdominal: Soft. Bowel sounds are normal.  Musculoskeletal:  Trace bilateral lower extremity edema, symmetric  Neurological: She is alert and oriented to person, place, and time.  Skin: Skin is warm and dry.  Psychiatric: She has a normal mood and affect.  Nursing note and vitals reviewed.   ED Course  Procedures   DIAGNOSTIC STUDIES:  Oxygen Saturation is 100% on RA, normal by my  interpretation.    COORDINATION OF CARE:  11:58 PM Discussed treatment plan with pt at bedside and pt agreed to plan.  Labs Review Labs Reviewed  BASIC METABOLIC PANEL - Abnormal; Notable for the following:    Potassium 3.4 (*)    Glucose, Bld 114 (*)    Calcium 8.3 (*)    All other components within normal limits  CBC - Abnormal; Notable for the following:    Hemoglobin 8.1 (*)    HCT 29.5 (*)    MCV 70.9 (*)    MCH 19.5 (*)    MCHC 27.5 (*)    RDW 19.2 (*)    Platelets 693 (*)    All other components within normal limits  D-DIMER, QUANTITATIVE (NOT AT [facility]) - Abnormal; Notable for the following:    D-Dimer, Quant 0.82 (*)  All other components within normal limits  TROPONIN I  I-STAT TROPOININ, ED    Imaging Review Dg Chest 2 View  09/27/2015  CLINICAL DATA:  Chest pain for 5 days EXAM: CHEST  2 VIEW COMPARISON:  04/18/2015 FINDINGS: Normal heart size and mediastinal contours. No acute infiltrate or edema. No effusion or pneumothorax. No acute osseous findings. Thyroidectomy clips. IMPRESSION: No active cardiopulmonary disease. Electronically Signed   By: Monte Fantasia M.D.   On: 09/27/2015 19:26   Ct Angio Chest Pe W/cm &/or Wo Cm  09/28/2015  CLINICAL DATA:  Chest pain and shortness of breath. Elevated D-dimer. EXAM: CT ANGIOGRAPHY CHEST WITH CONTRAST TECHNIQUE: Multidetector CT imaging of the chest was performed using the standard protocol during bolus administration of intravenous contrast. Multiplanar CT image reconstructions and MIPs were obtained to evaluate the vascular anatomy. CONTRAST:  65mL OMNIPAQUE IOHEXOL 350 MG/ML SOLN COMPARISON:  Radiographs 1 day prior.  Chest CT 12/24/2012 FINDINGS: There are no filling defects within the pulmonary arteries to suggest pulmonary embolus. Heart upper limits normal in size. Normal caliber thoracic aorta without aneurysm or evidence of dissection. No mediastinal. No pleural or pericardial effusion. Lungs are clear. No  consolidation, pulmonary mass or suspicious nodule. Post thyroidectomy with clips in the thyroid bed. The esophagus is decompressed. No acute abnormality in the included upper abdomen. There are no acute or suspicious osseous abnormalities. Review of the MIP images confirms the above findings. IMPRESSION: No pulmonary embolus or acute intrathoracic process. Electronically Signed   By: Jeb Levering M.D.   On: 09/28/2015 01:53   I have personally reviewed and evaluated these images and lab results as part of my medical decision-making.   EKG Interpretation   Date/Time:  Tuesday September 27 2015 18:31:13 EST Ventricular Rate:  95 PR Interval:  136 QRS Duration: 72 QT Interval:  346 QTC Calculation: 434 R Axis:   70 Text Interpretation:  Normal sinus rhythm ST abnormality, possible  digitalis effect Abnormal QRS-T angle, consider primary T wave abnormality  Abnormal ECG No significant change since last tracing Confirmed by Leeanne Butters   MD, [place] ([id number]) on 09/27/2015 11:49:03 PM      MDM   Final diagnoses:  Other chest pain    Patient presents with chest pain. No other significant symptoms. Does report mild cough.  She is nontoxic on exam.  Vital signs are reassuring. Afebrile. Exam is largely reassuring. She has mild tenderness to palpation on exam but it seems out of proportion to her pain. For this reason, screening d-dimer was obtained. It was elevated and patient was sent for CT scan. EKG, chest x-ray, troponin are negative. She is low risk ACS. She was initially given Toradol with minimal relief of symptoms. She was subsequently given Percocet. CT is negative for PE. Suspect chest wall pain versus costochondritis. Discussed with patient anti-inflammatory medications at home and follow-up with PCP. Of note, hemoglobin was noted to be 8.1. It appears to be progressively downtrending. Patient does still report that she occasionally gets a period. However, they are very heavy when she does.  She is otherwise asymptomatic. Follow-up with PCP.  After history, exam, and medical workup I feel the patient has been appropriately medically screened and is safe for discharge home. Pertinent diagnoses were discussed with the patient. Patient was given return precautions.  I personally performed the services described in this documentation, which was scribed in my presence. The recorded information has been reviewed and is accurate.    Merryl Hacker, MD  09/28/15 0211 

## 2015-09-27 NOTE — ED Notes (Signed)
Pt sts mid sternal CP worse with inspiration x 1 week; pt sts some cough

## 2015-09-28 ENCOUNTER — Emergency Department (HOSPITAL_COMMUNITY): Payer: BLUE CROSS/BLUE SHIELD

## 2015-09-28 LAB — TROPONIN I

## 2015-09-28 LAB — D-DIMER, QUANTITATIVE (NOT AT ARMC): D DIMER QUANT: 0.82 ug{FEU}/mL — AB (ref 0.00–0.50)

## 2015-09-28 MED ORDER — KETOROLAC TROMETHAMINE 60 MG/2ML IM SOLN
60.0000 mg | Freq: Once | INTRAMUSCULAR | Status: AC
  Administered 2015-09-28: 60 mg via INTRAMUSCULAR
  Filled 2015-09-28: qty 2

## 2015-09-28 MED ORDER — OXYCODONE-ACETAMINOPHEN 5-325 MG PO TABS: 2.0000 | ORAL_TABLET | Freq: Four times a day (QID) | ORAL | Status: DC | PRN

## 2015-09-28 MED ORDER — NAPROXEN 500 MG PO TABS
500.0000 mg | ORAL_TABLET | Freq: Two times a day (BID) | ORAL | Status: DC
Start: 2015-09-28 — End: 2015-12-21

## 2015-09-28 MED ORDER — OXYCODONE-ACETAMINOPHEN 5-325 MG PO TABS
1.0000 | ORAL_TABLET | Freq: Once | ORAL | Status: AC
  Administered 2015-09-28: 1 via ORAL
  Filled 2015-09-28: qty 1

## 2015-09-28 MED ORDER — IOHEXOL 350 MG/ML SOLN
100.0000 mL | Freq: Once | INTRAVENOUS | Status: AC | PRN
  Administered 2015-09-28: 60 mL via INTRAVENOUS

## 2015-09-28 NOTE — ED Notes (Signed)
Patient transported to CT 

## 2015-09-28 NOTE — Discharge Instructions (Signed)
Chest Wall Pain Chest wall pain is pain in or around the bones and muscles of your chest. Sometimes, an injury causes this pain. Sometimes, the cause may not be known. This pain may take several weeks or longer to get better. HOME CARE INSTRUCTIONS  Pay attention to any changes in your symptoms. Take these actions to help with your pain:   Rest as told by your health care provider.   Avoid activities that cause pain. These include any activities that use your chest muscles or your abdominal and side muscles to lift heavy items.   If directed, apply ice to the painful area:  Put ice in a plastic bag.  Place a towel between your skin and the bag.  Leave the ice on for 20 minutes, 2-3 times per day.  Take over-the-counter and prescription medicines only as told by your health care provider.  Do not use tobacco products, including cigarettes, chewing tobacco, and e-cigarettes. If you need help quitting, ask your health care provider.  Keep all follow-up visits as told by your health care provider. This is important. SEEK MEDICAL CARE IF:  You have a fever.  Your chest pain becomes worse.  You have new symptoms. SEEK IMMEDIATE MEDICAL CARE IF:  You have nausea or vomiting.  You feel sweaty or light-headed.  You have a cough with phlegm (sputum) or you cough up blood.  You develop shortness of breath.   This information is not intended to replace advice given to you by your health care provider. Make sure you discuss any questions you have with your health care provider.   Document Released: 09/03/2005 Document Revised: 05/25/2015 Document Reviewed: 11/29/2014 Elsevier Interactive Patient Education 2016 [employer]. Anemia, Nonspecific Anemia is a condition in which the concentration of red blood cells or hemoglobin in the blood is below normal. Hemoglobin is a substance in red blood cells that carries oxygen to the tissues of the body. Anemia results in not enough oxygen  reaching these tissues.  CAUSES  Common causes of anemia include:   Excessive bleeding. Bleeding may be internal or external. This includes excessive bleeding from periods (in women) or from the intestine.   Poor nutrition.   Chronic kidney, thyroid, and liver disease.  Bone marrow disorders that decrease red blood cell production.  Cancer and treatments for cancer.  HIV, AIDS, and their treatments.  Spleen problems that increase red blood cell destruction.  Blood disorders.  Excess destruction of red blood cells due to infection, medicines, and autoimmune disorders. SIGNS AND SYMPTOMS   Minor weakness.   Dizziness.   Headache.  Palpitations.   Shortness of breath, especially with exercise.   Paleness.  Cold sensitivity.  Indigestion.  Nausea.  Difficulty sleeping.  Difficulty concentrating. Symptoms may occur suddenly or they may develop slowly.  DIAGNOSIS  Additional blood tests are often needed. These help your health care provider determine the best treatment. Your health care provider will check your stool for blood and look for other causes of blood loss.  TREATMENT  Treatment varies depending on the cause of the anemia. Treatment can include:   Supplements of iron, vitamin 123456, or folic acid.   Hormone medicines.   A blood transfusion. This may be needed if blood loss is severe.   Hospitalization. This may be needed if there is significant continual blood loss.   Dietary changes.  Spleen removal. HOME CARE INSTRUCTIONS Keep all follow-up appointments. It often takes many weeks to correct anemia, and having your health  care provider check on your condition and your response to treatment is very important. SEEK IMMEDIATE MEDICAL CARE IF:   You develop extreme weakness, shortness of breath, or chest pain.   You become dizzy or have trouble concentrating.  You develop heavy vaginal bleeding.   You develop a rash.   You have  bloody or black, tarry stools.   You faint.   You vomit up blood.   You vomit repeatedly.   You have abdominal pain.  You have a fever or persistent symptoms for more than 2-3 days.   You have a fever and your symptoms suddenly get worse.   You are dehydrated.  MAKE SURE YOU:  Understand these instructions.  Will watch your condition.  Will get help right away if you are not doing well or get worse.   This information is not intended to replace advice given to you by your health care provider. Make sure you discuss any questions you have with your health care provider.   Document Released: 10/11/2004 Document Revised: 05/06/2013 Document Reviewed: 02/27/2013 Elsevier Interactive Patient Education [employer].

## 2015-10-02 ENCOUNTER — Encounter: Payer: Self-pay | Admitting: Physician Assistant

## 2015-10-02 ENCOUNTER — Ambulatory Visit (INDEPENDENT_AMBULATORY_CARE_PROVIDER_SITE_OTHER): Payer: BLUE CROSS/BLUE SHIELD | Admitting: Physician Assistant

## 2015-10-02 VITALS — BP 130/80 | HR 102 | Temp 97.9°F | Resp 18 | Ht 64.0 in | Wt 251.0 lb

## 2015-10-02 DIAGNOSIS — D649 Anemia, unspecified: Secondary | ICD-10-CM

## 2015-10-02 DIAGNOSIS — R079 Chest pain, unspecified: Secondary | ICD-10-CM

## 2015-10-02 DIAGNOSIS — R0600 Dyspnea, unspecified: Secondary | ICD-10-CM

## 2015-10-02 DIAGNOSIS — L309 Dermatitis, unspecified: Secondary | ICD-10-CM

## 2015-10-02 DIAGNOSIS — R0602 Shortness of breath: Secondary | ICD-10-CM

## 2015-10-02 DIAGNOSIS — G479 Sleep disorder, unspecified: Secondary | ICD-10-CM

## 2015-10-02 LAB — POCT CBC
Granulocyte percent: 66 %G (ref 37–80)
HEMATOCRIT: 26.5 % — AB (ref 37.7–47.9)
Hemoglobin: 7.8 g/dL — AB (ref 12.2–16.2)
LYMPH, POC: 2.4 (ref 0.6–3.4)
MCH: 19.5 pg — AB (ref 27–31.2)
MCHC: 29.4 g/dL — AB (ref 31.8–35.4)
MCV: 66.2 fL — AB (ref 80–97)
MID (CBC): 0.5 (ref 0–0.9)
MPV: 7 fL (ref 0–99.8)
POC Granulocyte: 5.5 (ref 2–6.9)
POC LYMPH PERCENT: 28.2 %L (ref 10–50)
POC MID %: 5.8 % (ref 0–12)
Platelet Count, POC: 637 10*3/uL — AB (ref 142–424)
RBC: 4 M/uL — AB (ref 4.04–5.48)
RDW, POC: 20 %
WBC: 8.4 10*3/uL (ref 4.6–10.2)

## 2015-10-02 LAB — IRON,TIBC AND FERRITIN PANEL
%SAT: 4 % — AB (ref 11–50)
Ferritin: 8 ng/mL — ABNORMAL LOW (ref 10–291)
Iron: 15 ug/dL — ABNORMAL LOW (ref 40–190)
TIBC: 353 ug/dL (ref 250–450)

## 2015-10-02 LAB — FOLATE: FOLATE: 16.8 ng/mL

## 2015-10-02 LAB — VITAMIN B12: Vitamin B-12: 731 pg/mL (ref 211–911)

## 2015-10-02 MED ORDER — ZALEPLON 5 MG PO CAPS: 5.0000 mg | ORAL_CAPSULE | Freq: Every evening | ORAL | Status: DC | PRN

## 2015-10-02 MED ORDER — NYSTATIN 100000 UNIT/GM EX POWD: Freq: Four times a day (QID) | CUTANEOUS | Status: AC

## 2015-10-02 NOTE — Progress Notes (Signed)
Patient ID: Emily Mathis, female    DOB: 1966-03-01, 50 y.o.   MRN: [id number]  PCP: Olene Floss  Subjective:   Chief Complaint  Patient presents with  . Follow-up    ER, chest pain, anemia  . Rash    right breast   . Leg Swelling    HPI Presents for evaluation of ED follow-up, and also a rash on the right breast.  She presented to the ED on 09/27/2015 complaining of 10/10 chest pain x 1 week, but worse that day. She had also had some hyperventilation that was addressed by EMS. The pain was worse with lying supine and with deep inspiration. She reported a mild dry cough and chronic BLE edema. Tramadol had not helped her pain. On exam she was noted to have chest wall tenderness and trace BLE edema. EKG was essentially normal, and unchanged from previous tracings. CXR was normal. Troponin i was 0.0 D-dimer was positive, so CT scan was performed-also normal. Calcium was 8.3 (actually up from recent checks), mild hypokalemia (3.4). CBC revealed anemia with Hgb 8.1 (down from 10 on 04/18/2015), and elevated platelets (693).  The CP was thought due to costochrondritis vs. Musculoskeletal chest wall pain and she was avised to use NSAIDS and follow-up here. She was prescribed naproxen to replace the meloxicam she was previously using.  She notes that the LE edema seems better. When walking, feels like she has trouble breathing, like something is pressing on her chest. Worse with stair climbing. "I just get so out of breath." Uses Qvar 80 mcg, 2 puffs BID, and and for about 2 weeks, she's been needing the rescue albuterol inhaler daily (about 3 times each day). Prior to that, maybe once every other week.  She has been taking the Qvar now for several years. She has a reported history of asthma, first evaluated by me 06/2013 after an ED visit for asthma exacerbation. Each time she complained of SOB, exams were negative for wheezing. Stress and anxiety were triggering her symptoms, and  once anxiety and depression were addressed, she rarely needed albuterol rescue.  "I don't sleep. I just can't sleep." Working two jobs since her boyfriend moved out in early December, taking their son with him.  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 Has difficulty getting settled down to sleep, and then it's time to get up. Can't afford to leave one of these positions.  Hasn't been able to take time off to attend medical visits here or with rheumatology, also can't always afford the co-pay. Sees Dr. Evlyn Kanner, managing hypothyroidism and diabetes. Has a follow-up there coming up. Has been losing weight, not liking the extra skin, and frustrated at at today's visit her weight is up 5 lbs.  Rash on the breast developed last month after started a second job. She was using a back support, and had a lot of sweating. She noticed a "cut" under the breast and started using cornstarch in the area to dry it. Now she notices a skin color change.   Review of Systems  Constitutional: Positive for fatigue. Negative for fever, chills, diaphoresis, activity change, appetite change and unexpected weight change.  HENT: Negative for congestion, ear pain, sinus pressure, sneezing, sore throat and voice change.   Eyes:  Negative for photophobia and visual disturbance.  Respiratory: Positive for chest tightness and shortness of breath. Negative for apnea, cough, choking, wheezing and stridor.   Cardiovascular: Positive for chest pain (pressure, not like when she was in the ED) and leg swelling. Negative for palpitations.  Gastrointestinal: Positive for diarrhea. Negative for nausea, vomiting, abdominal pain, constipation, abdominal distention and anal bleeding.       Melena today, after 2 loose  stools. Normal BMs recently.  Endocrine: Negative for cold intolerance, heat intolerance, polydipsia, polyphagia and polyuria.       Dry mouth  Genitourinary: Positive for frequency (not more than usual). Negative for dysuria, urgency and hematuria. Vaginal bleeding: passed some vaginal clots 09/16/15, but never had a menstrual bleed. Last normal menses was in November.  Skin: Positive for rash.  Allergic/Immunologic: Negative for environmental allergies and food allergies.  Neurological: Negative for dizziness, weakness, light-headedness, numbness and headaches.  Hematological: Negative for adenopathy. Does not bruise/bleed easily.       Pica (ice)  Psychiatric/Behavioral: Positive for sleep disturbance and dysphoric mood. Negative for suicidal ideas, hallucinations, self-injury, decreased concentration and agitation. The patient is nervous/anxious.        Patient Active Problem List   Diagnosis Date Noted  . Polyarthralgia 06/16/2015  . Diabetes mellitus type 2, uncomplicated (HCC) 09/14/2013  . Keloid scar, cervical incision 09/02/2013  . Reaction, situational, acute, to stress 07/02/2013  . 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 Severe obesity (BMI >= 40) (HCC) 03/27/2013  . GERD (gastroesophageal reflux disease) 02/15/2013  . Hypocalcemia 01/07/2013  . Chest pain on exertion 07/24/2011     Prior to Admission medications   Medication Sig Start Date End Date Taking? Authorizing Provider  albuterol (PROVENTIL HFA;VENTOLIN HFA) 108 (90 BASE) MCG/ACT inhaler Inhale 2 puffs into the lungs daily as needed. For shortness of breath   Yes Historical Provider, MD  ALPRAZolam Prudy Feeler) 0.5 MG tablet TAKE 1 TABLET BY MOUTH AT BEDTIME AS NEEDED FOR SLEEP 08/23/15  Yes Dearl Rudden, PA-C  chlorthalidone (HYGROTON) 25 MG tablet Take 1 tablet (25 mg total) by mouth daily. 08/23/15  Yes Vonzella Althaus, PA-C  diazepam (VALIUM) 5 MG tablet TAKE 1 TABLET BY MOUTH EVERY 6 HOURS AS NEEDED FOR  MUSCLE SPASMS 08/23/15  Yes Yvonna Brun, PA-C  INVOKAMET 150-500 MG TABS TAKE 1 TABLET BY MOUTH ONCE DAILY 09/26/15  Yes Denyse Fillion, PA-C  levothyroxine (SYNTHROID, LEVOTHROID) 300 MCG tablet Take 1 tablet (300 mcg total) by mouth daily. 06/17/15  Yes Jerelle Virden, PA-C  lisinopril (PRINIVIL,ZESTRIL) 2.5 MG tablet Take 1 tablet (2.5 mg total) by mouth daily. 03/11/15  Yes Audriana Aldama, PA-C  naproxen (NAPROSYN) 500 MG tablet Take 1 tablet (500 mg total) by mouth 2 (two) times daily. 09/28/15  Yes Shon Baton, MD  oxyCODONE-acetaminophen (PERCOCET/ROXICET) 5-325 MG tablet Take 2 tablets by mouth every 6 (six) hours as needed for severe pain. 09/28/15  Yes Shon Baton, MD  pantoprazole (PROTONIX) 40 MG tablet TAKE 1 TABLET (40 MG TOTAL) BY MOUTH 2 (TWO) TIMES DAILY. 10/18/14  Yes Jose Alleyne, PA-C  QVAR 80 MCG/ACT inhaler INHALE 2 PUFFS INTO THE LUNGS 2 (TWO) TIMES DAILY. 11/25/14  Yes Bronx Brogden, PA-C  sertraline (ZOLOFT) 100 MG tablet Take 1 tablet (100 mg total) by mouth daily. 03/11/15  Yes Banessa Mao, PA-C  sucralfate (CARAFATE) 1 G tablet Take 1 tablet (1 g total) by mouth 4 (four) times daily. 04/19/15  Yes Marisa Severin, MD  traMADol (ULTRAM) 50 MG tablet  Take 50 mg by mouth every 6 (six) hours as needed for moderate pain.   Yes Historical Provider, MD     Allergies  Allergen Reactions  . Latex     Itchy & leaves marks on skin  . Dilaudid [Hydromorphone Hcl] Itching  . Hydrocodone Itching       Objective:  Physical Exam  Constitutional: She is oriented to person, place, and time. She appears well-developed and well-nourished. No distress.  BP 130/80 mmHg  Pulse 102  Temp(Src) 97.9 F (36.6 C) (Oral)  Resp 18  Ht 5\' 4"  (1.626 m)  Wt 251 lb (113.853 kg)  BMI 43.06 kg/m2  SpO2 98%   HENT:  Head: Normocephalic and atraumatic.  Right Ear: Hearing, tympanic membrane, external ear and ear canal normal.  Left Ear: Hearing, tympanic membrane, external ear and ear  canal normal.  Nose: Nose normal.  Mouth/Throat: Uvula is midline, oropharynx is clear and moist and mucous membranes are normal. No oral lesions. Normal dentition.  Eyes: Conjunctivae, EOM and lids are normal. Pupils are equal, round, and reactive to light. No scleral icterus.  Neck: Normal range of motion, full passive range of motion without pain and phonation normal. Neck supple. No thyromegaly present.  Cardiovascular: Normal rate, regular rhythm, normal heart sounds and intact distal pulses.   Pulmonary/Chest: Effort normal and breath sounds normal. She has no wheezes.  Lymphadenopathy:    She has no cervical adenopathy.  Neurological: She is alert and oriented to person, place, and time. She has normal strength. No cranial nerve deficit or sensory deficit. Gait normal.  Skin: Skin is warm and dry. Rash noted. Rash is maculopapular.     Hyperpigmentation under the RIGHT breast with fine scale at the edges.  Psychiatric: She has a normal mood and affect. Her speech is normal and behavior is normal.       Office Spirometry Results: restriction present FEV1: 1.56 liters FVC: 1.97 liters FEV1/FVC: 79.2 % FVC  % Predicted: 68 liters FEV % Predicted: 67 liters FeF 25-75: 1.69 liters FeF 25-75 % Predicted: 69  Results for orders placed or performed in visit on 10/02/15  POCT CBC  Result Value Ref Range   WBC 8.4 4.6 - 10.2 K/uL   Lymph, poc 2.4 0.6 - 3.4   POC LYMPH PERCENT 28.2 10 - 50 %L   MID (cbc) 0.5 0 - 0.9   POC MID % 5.8 0 - 12 %M   POC Granulocyte 5.5 2 - 6.9   Granulocyte percent 66.0 37 - 80 %G   RBC 4.00 (A) 4.04 - 5.48 M/uL   Hemoglobin 7.8 (A) 12.2 - 16.2 g/dL   HCT, POC 47.8 (A) 29.5 - 47.9 %   MCV 66.2 (A) 80 - 97 fL   MCH, POC 19.5 (A) 27 - 31.2 pg   MCHC 29.4 (A) 31.8 - 35.4 g/dL   RDW, POC 62.1 %   Platelet Count, POC 637 (A) 142 - 424 K/uL   MPV 7.0 0 - 99.8 fL       Assessment & Plan:   1. Anemia, unspecified anemia type Await remaining labs.  Refer to GI for additional evaluation. May be contributing to dyspnea. - POCT CBC - Iron, TIBC and Ferritin Panel - Vitamin B12 - Folate - Ambulatory referral to Gastroenterology  2. Sleep disturbance She needs to sleep more than 4 hours/night, but doesn't have that to sleep working 2 jobs. Trial of Sonata, to help her sleep during the time that she  does have. - zaleplon (SONATA) 5 MG capsule; Take 1 capsule (5 mg total) by mouth at bedtime as needed for sleep.  Dispense: 20 capsule; Refill: 0  3. Dyspnea Increase Qvar to 3 puffs BID and refer to pulmonology. Her obesity likely contributes to this issue. - Ambulatory referral to Pulmonology  4. Dermatitis Candida intertrigo. Anticipatory guidance. Try to keep the area dry. - nystatin (MYCOSTATIN) powder; Apply topically 4 (four) times daily.  Dispense: 60 g; Refill: 2   Fernande Bras, PA-C Physician Assistant-Certified [facility]

## 2015-10-02 NOTE — Progress Notes (Signed)
Received a call at 4:10 that she was calling with questions.  Alerted Harrison Mons PA-C who will give her a call

## 2015-10-02 NOTE — Patient Instructions (Signed)
Increase the Qvar to 3 puffs twice each day.

## 2015-10-03 ENCOUNTER — Encounter: Payer: Self-pay | Admitting: Gastroenterology

## 2015-10-21 ENCOUNTER — Ambulatory Visit (INDEPENDENT_AMBULATORY_CARE_PROVIDER_SITE_OTHER): Payer: BLUE CROSS/BLUE SHIELD | Admitting: Internal Medicine

## 2015-10-21 ENCOUNTER — Encounter: Payer: Self-pay | Admitting: Internal Medicine

## 2015-10-21 VITALS — BP 122/76 | HR 100 | Ht 64.0 in | Wt 247.0 lb

## 2015-10-21 DIAGNOSIS — R058 Other specified cough: Secondary | ICD-10-CM

## 2015-10-21 DIAGNOSIS — R05 Cough: Secondary | ICD-10-CM

## 2015-10-21 NOTE — Assessment & Plan Note (Addendum)
Classic Upper airway cough syndrome, so named because it's frequently impossible to sort out how much is  CR/sinusitis with freq throat clearing (which can be related to primary GERD)   vs  causing  secondary (" extra esophageal")  GERD from wide swings in gastric pressure that occur with throat clearing, often  promoting self use of mint and menthol lozenges that reduce the lower esophageal sphincter tone and exacerbate the problem further in a cyclical fashion.   These are the same pts (now being labeled as having "irritable larynx syndrome" by some cough centers) who not infrequently have a history of having failed to tolerate ace inhibitors,  dry powder inhalers or biphosphonates or report having atypical reflux symptoms that don't respond to standard doses of PPI (and even carafate, which may be the case here), and are easily confused as having aecopd or asthma flares by even experienced allergists/ pulmonologists.   I see no evidence of asthma/ cough variant or otherwise, at this point  Will first try off acei and on aggressive gerd rx then return in 2 weeks to see what kind of progress she's making off asthma rx   I had an extended discussion with the patient reviewing all relevant studies completed to date and  Lasting 35 minutes of a 60 minute visit    Each maintenance medication was reviewed in detail including most importantly the difference between maintenance and prns and under what circumstances the prns are to be triggered using an action plan format that is not reflected in the computer generated alphabetically organized AVS.    Please see instructions for details which were reviewed in writing and the patient given a copy highlighting the part that I personally wrote and discussed at today's ov.

## 2015-10-21 NOTE — Assessment & Plan Note (Signed)
Body mass index is 42.38    Lab Results  Component Value Date   TSH 17.96* 06/15/2015     Contributing to gerd tendency/ doe/reviewed the need and the process to achieve and maintain neg calorie balance > defer f/u primary care including intermittently monitoring thyroid status

## 2015-10-21 NOTE — Progress Notes (Signed)
Subjective:    Patient ID: Emily Mathis, female    DOB: Feb 06, 1966,    MRN: [id number]  HPI  35 yobf with tendency to asthma as child better by HS with good intolerance and ran tack rarely needed inhaler and if so just before competition but gained wt p hs and noted doe attributed to wt gain but since 2013/14 daily symptoms of cough/ sob/chest tightness referred by Theora Gianotti PA to pulmonary clinic 10/21/2015    10/21/2015 1st [facility] office visit/ Emily Mathis   Chief Complaint  Patient presents with  . Pulmonary Consult    Referred by Theora Gianotti, PA. Pt c/o CP and SOB for the past 2 yrs- worse over the past few wks. She states she is SOB walking for 3-4 min on flat surface. She states CP comes and goes and is worse with exertion.     although she minimizes the cough/ she starts coughing as soon as she starts talking to point where hard to complete the interview. Cough is dry and day >> noct assoc with overt hb.  Not sure how long on ACEi, aleady on ppi and carafate with variable dysphagia. gen cp anteriorly worse with coughing fits / no better on qvar, ? Some etter on saba.  Doe x proprotionate purely to activity/ not weather / season  No obvious other patterns in day to day or daytime variabilty or assoc chronic cough or cp or chest tightness, subjective wheeze overt sinus or hb symptoms. No unusual exp hx or h/o childhood pna/ asthma or knowledge of premature birth.  Sleeping ok without nocturnal  or early am exacerbation  of respiratory  c/o's or need for noct saba. Also denies any obvious fluctuation of symptoms with weather or environmental changes or other aggravating or alleviating factors except as outlined above   Current Medications, Allergies, Complete Past Medical History, Past Surgical History, Family History, and Social History were reviewed in [employer] record.            Review of Systems  Constitutional: Negative for fever, chills and  unexpected weight change.  HENT: Positive for dental problem, sore throat and trouble swallowing. Negative for congestion, ear pain, nosebleeds, postnasal drip, rhinorrhea, sinus pressure, sneezing and voice change.   Eyes: Negative for visual disturbance.  Respiratory: Positive for cough and shortness of breath. Negative for choking.   Cardiovascular: Positive for chest pain. Negative for leg swelling.  Gastrointestinal: Negative for vomiting, abdominal pain and diarrhea.  Genitourinary: Negative for difficulty urinating.       Acid heartburn Indigestion  Musculoskeletal: Positive for arthralgias.  Skin: Positive for rash.  Neurological: Positive for headaches. Negative for tremors and syncope.  Hematological: Does not bruise/bleed easily.       Objective:   Physical Exam  amb bf with voice fatigue   Wt Readings from Last 3 Encounters:  10/21/15 247 lb (112.038 kg)  10/02/15 251 lb (113.853 kg)  03/11/15 275 lb (124.739 kg)    Vital signs reviewed   HEENT: nl dentition, turbinates, and oropharynx. Nl external ear canals without cough reflex   NECK :  without JVD/Nodes/TM/ nl carotid upstrokes bilaterally   LUNGS: no acc muscle use,  Nl contour chest which is clear to A and P bilaterally without cough on insp or exp maneuvers   CV:  RRR  no s3 or murmur or increase in P2, no edema   ABD:  soft and nontender with nl inspiratory excursion in the supine position.  No bruits or organomegaly, bowel sounds nl  MS:  Nl gait/ ext warm without deformities, calf tenderness, cyanosis or clubbing No obvious joint restrictions   SKIN: warm and dry without lesions    NEURO:  alert, approp, nl sensorium with  no motor deficits     I personally reviewed images and agree with radiology impression as follows:  CTa Chest   09/28/15  No pulmonary embolus or acute intrathoracic process.      Assessment & Plan:

## 2015-10-21 NOTE — Patient Instructions (Signed)
Stop lisinopril and qvar and you should see gradually less need for the rescue inhaler= proventil  Up to every 4 hours if needed   Pantoprazole should be Take 30-60 min before first meal of the day and add pepcid 20 mg one at bedtime  GERD (REFLUX)  is an extremely common cause of respiratory symptoms just like yours , many times with no obvious heartburn at all.    It can be treated with medication, but also with lifestyle changes including elevation of the head of your bed (ideally with 6 inch  bed blocks),  Smoking cessation, avoidance of late meals, excessive alcohol, and avoid fatty foods, chocolate, peppermint, colas, red wine, and acidic juices such as orange juice.  NO MINT OR MENTHOL PRODUCTS SO NO COUGH DROPS  USE SUGARLESS CANDY INSTEAD (Jolley ranchers or Stover's or Life Savers) or even ice chips will also do - the key is to swallow to prevent all throat clearing. NO OIL BASED VITAMINS - use powdered substitutes.  If you can't stop coughing > tramadol will stop the cough, ok to take up every 4 hours as needed    Please schedule a follow up office visit in 2 weeks, sooner if needed  When return bring your medications in 2 separate bags, the ones you take no matter(automatically)  what vs the as needed (only when you feel you need them)

## 2015-11-11 ENCOUNTER — Ambulatory Visit: Payer: BLUE CROSS/BLUE SHIELD | Admitting: Internal Medicine

## 2015-11-17 ENCOUNTER — Telehealth: Payer: Self-pay

## 2015-11-17 NOTE — Telephone Encounter (Signed)
Left message for pt to call back so we can get more information about she needs.

## 2015-11-17 NOTE — Telephone Encounter (Signed)
Patient was see by Dr. Reynold Bowen at [facility] on 07/29/15 and [place] did not cover the visit since she did not have a referral. Patient wants to know if we'll be able to help. Please call!  [phone]

## 2015-11-18 NOTE — Telephone Encounter (Signed)
I called [employer] to inquire about the retro-authorization that the patient requested for her OV with Reynold Bowen on 07/29/15.  They informed me that because it is a termed account, expiring 09/17/15, they cannot open new cases for the patient.  I spoke to the patient and informed her that the retro could not be completed.  She expressed understanding.

## 2015-11-22 ENCOUNTER — Encounter: Payer: Self-pay | Admitting: Gastroenterology

## 2015-11-22 ENCOUNTER — Other Ambulatory Visit (INDEPENDENT_AMBULATORY_CARE_PROVIDER_SITE_OTHER): Payer: BLUE CROSS/BLUE SHIELD

## 2015-11-22 ENCOUNTER — Ambulatory Visit (INDEPENDENT_AMBULATORY_CARE_PROVIDER_SITE_OTHER): Payer: BLUE CROSS/BLUE SHIELD | Admitting: Gastroenterology

## 2015-11-22 VITALS — BP 128/80 | HR 100 | Ht 63.0 in | Wt 253.2 lb

## 2015-11-22 DIAGNOSIS — N921 Excessive and frequent menstruation with irregular cycle: Secondary | ICD-10-CM

## 2015-11-22 DIAGNOSIS — Z800 Family history of malignant neoplasm of digestive organs: Secondary | ICD-10-CM

## 2015-11-22 DIAGNOSIS — D509 Iron deficiency anemia, unspecified: Secondary | ICD-10-CM

## 2015-11-22 DIAGNOSIS — K219 Gastro-esophageal reflux disease without esophagitis: Secondary | ICD-10-CM

## 2015-11-22 LAB — CBC WITH DIFFERENTIAL/PLATELET
BASOS ABS: 0.1 10*3/uL (ref 0.0–0.1)
BASOS PCT: 0.6 % (ref 0.0–3.0)
EOS ABS: 0.2 10*3/uL (ref 0.0–0.7)
Eosinophils Relative: 1.8 % (ref 0.0–5.0)
HEMATOCRIT: 27.3 % — AB (ref 36.0–46.0)
LYMPHS PCT: 32.7 % (ref 12.0–46.0)
Lymphs Abs: 3.6 10*3/uL (ref 0.7–4.0)
MCHC: 30 g/dL (ref 30.0–36.0)
MONOS PCT: 4.5 % (ref 3.0–12.0)
Monocytes Absolute: 0.5 10*3/uL (ref 0.1–1.0)
Neutro Abs: 6.6 10*3/uL (ref 1.4–7.7)
Neutrophils Relative %: 60.4 % (ref 43.0–77.0)
Platelets: 634 10*3/uL — ABNORMAL HIGH (ref 150.0–400.0)
RBC: 4.14 Mil/uL (ref 3.87–5.11)
RDW: 21.3 % — ABNORMAL HIGH (ref 11.5–15.5)
WBC: 10.9 10*3/uL — AB (ref 4.0–10.5)

## 2015-11-22 NOTE — Patient Instructions (Addendum)
Please pick up over the counter iron sulfate tablets and start taking one twie a day.  Go to the lab to have your blood count checked today.  We will call with those results, then make plans for another blood count in a few weeks and schedule your upper scope and colonoscopy when your blood counts have come up some.  Your physician has requested that you go to the basement for the following lab work before leaving today: cbc   Thank you for choosing [facility]  Dr Wilfrid Lund III

## 2015-11-22 NOTE — Progress Notes (Signed)
Gastroenterology and Hepatology Consult Note:  History: Emily Mathis 11/22/2015  Referring physician: JEFFERY,[name], PA-C  Reason for consult/chief complaint: Anemia and Dark stool   Subjective HPI:  [name] was referred for iron deficiency anemia. She was found to have a hemoglobin of 8.1 during an ED visit in January for "all over pain". She describes diffuse myalgias and arthralgias for which she was just referred to rheumatology, lab work is pending. At PCP visit on 10/02/2015, she had a hemoglobin of 7.8 with a ferritin of 8. It does not appear that any iron tablets were started. Patient describes rectal bleeding, says her bowels have been irregular for years. Her appetite is variable, she denies dysphagia odynophagia nausea vomiting or weight loss. She has chronic pyrosis and regurgitation She also reports irregular and at times heavy menses.  ROS:  Review of Systems  Constitutional: Negative for appetite change and unexpected weight change.  HENT: Negative for mouth sores and voice change.   Eyes: Negative for pain and redness.  Respiratory: Positive for shortness of breath. Negative for cough.   Cardiovascular: Negative for chest pain and palpitations.  Endocrine: Positive for cold intolerance.  Genitourinary: Positive for menstrual problem. Negative for dysuria and hematuria.  Musculoskeletal: Positive for myalgias and arthralgias.  Skin: Negative for pallor and rash.  Neurological: Positive for light-headedness. Negative for weakness and headaches.  Hematological: Negative for adenopathy.   Menses irregular.  Sometimes 2 weeks heavy , but sometimes none for months Diffuse pain - recently saw rheum - no lab results yet Past Medical History: Past Medical History  Diagnosis Date  . Hyperlipidemia   . Asthma   . Thyroid disease   . Arthritis   . Hypertension     per Dr. Irven Shelling note 08/30/2011  . Anxiety   . GERD (gastroesophageal reflux disease)   . Headache(784.0)    . 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 Diabetes mellitus without complication ([place])   . Neuromuscular disorder [facility])      Past Surgical History: Past Surgical History  Procedure Laterality Date  . Cesarean section  1995  . Cesarean section  1996  . Cardiac catheterization  2013  . Thyroidectomy N/A 12/18/2012    Procedure: TOTAL THYROIDECTOMY;  Surgeon: Earnstine Regal, MD;  Location: [facility] ORS;  Service: General;  Laterality: N/A;  . Left heart catheterization with coronary angiogram  07/24/2011    Procedure: LEFT HEART CATHETERIZATION WITH CORONARY ANGIOGRAM;  Surgeon: Laverda Page, MD;  Location: [facility] CATH LAB;  Service: Cardiovascular;;     Family History: Family History  Problem Relation Age of Onset  . Heart disease Mother   . Stroke Mother   . Hypertension Mother   . Heart disease Father   . Colon cancer Father   . Hypertension Father   . Breast cancer Paternal Aunt   . Cancer Paternal Grandmother   . Cancer Paternal Aunt   . Asthma Daughter   . Seizures Daughter   . Hypertension Sister   . Mental illness Daughter 26    suicide attempt 06/2013  . Asthma Sister     Social History: Social History   Social History  . Marital Status: Single    Spouse Name: n/a  . Number of Children: 4  . Years of Education: N/A   Occupational History  . DAY-CARE TEACHER   . receiving (returns)     [employer]   Social History Main Topics  . Smoking status: Never Smoker   . Smokeless tobacco:  Never Used  . Alcohol Use: Yes     Comment: Occasional  . Drug Use: No  . Sexual Activity: Not Asked   Other Topics Concern  . None   Social History Narrative   Lives with the youngest of her 3 daughters.   Her oldest daughter lives in [place].   Her middle daughter lives independently, and is due with her first child 12/01/15.   Her son lives with his father, who moved out 04/2015, nearby.       Allergies: Allergies  Allergen Reactions  . Latex     Itchy & leaves marks on  skin  . Dilaudid [Hydromorphone Hcl] Itching  . Hydrocodone Itching    Outpatient Meds: Current Outpatient Prescriptions  Medication Sig Dispense Refill  . albuterol (PROVENTIL HFA;VENTOLIN HFA) 108 (90 BASE) MCG/ACT inhaler Inhale 2 puffs into the lungs daily as needed. For shortness of breath    . ALPRAZolam (XANAX) 0.5 MG tablet TAKE 1 TABLET BY MOUTH AT BEDTIME AS NEEDED FOR SLEEP 30 tablet 0  . chlorthalidone (HYGROTON) 25 MG tablet Take 1 tablet (25 mg total) by mouth daily. 90 tablet 0  . diazepam (VALIUM) 5 MG tablet TAKE 1 TABLET BY MOUTH EVERY 6 HOURS AS NEEDED FOR MUSCLE SPASMS 30 tablet 0  . INVOKAMET 150-500 MG TABS TAKE 1 TABLET BY MOUTH ONCE DAILY 30 tablet 0  . levothyroxine (SYNTHROID, LEVOTHROID) 300 MCG tablet Take 1 tablet (300 mcg total) by mouth daily. 90 tablet 3  . naproxen (NAPROSYN) 500 MG tablet Take 1 tablet (500 mg total) by mouth 2 (two) times daily. 30 tablet 0  . nystatin (MYCOSTATIN) powder Apply topically 4 (four) times daily. 60 g 2  . pantoprazole (PROTONIX) 40 MG tablet TAKE 1 TABLET (40 MG TOTAL) BY MOUTH 2 (TWO) TIMES DAILY. 180 tablet 3  . sertraline (ZOLOFT) 100 MG tablet Take 1 tablet (100 mg total) by mouth daily. 90 tablet 1  . sucralfate (CARAFATE) 1 G tablet Take 1 tablet (1 g total) by mouth 4 (four) times daily. 30 tablet 0  . traMADol (ULTRAM) 50 MG tablet Take 50 mg by mouth every 6 (six) hours as needed for moderate pain.    . zaleplon (SONATA) 5 MG capsule Take 1 capsule (5 mg total) by mouth at bedtime as needed for sleep. 20 capsule 0   No current facility-administered medications for this visit.      ___________________________________________________________________ Objective  Exam:  BP 128/80 mmHg  Pulse 100  Ht 5\' 3"  (1.6 m)  Wt 253 lb 3.2 oz (114.851 kg)  BMI 44.86 kg/m2   General: this is a obese middle-aged African-American woman, not acutely ill   Eyes: sclera anicteric, no redness, no conjunctival pallor  ENT:  oral mucosa moist without lesions, no cervical or supraclavicular lymphadenopathy, good dentition  CV: RRR without murmur, S1/S2, no JVD, no peripheral edema  Resp: clear to auscultation bilaterally, normal RR and effort noted  GI: soft, no tenderness, with active bowel sounds. No guarding or palpable organomegaly noted, limited by abdominal girth  Skin; warm and dry, no rash or jaundice noted  Neuro: awake, alert and oriented x 3. Normal gross motor function and fluent speech  Labs:  Lab Results  Component Value Date   WBC 8.4 10/02/2015   HGB 7.8* 10/02/2015   HCT 26.5* 10/02/2015   MCV 66.2* 10/02/2015   PLT 693* 09/27/2015  ferritin 8  04/18/15  Hgb 10  mcv 78   Assessment: Encounter Diagnoses  Name Primary?  Marland Kitchen Anemia, iron deficiency Yes  . Family history of colon cancer   . Menorrhagia with irregular cycle   . Gastroesophageal reflux disease, esophagitis presence not specified     The anemia sounds likely from menorrhagia, but frequent NSAID use and family history of colorectal cancer mean we must rule out an occult source of GI blood loss. EGD and colonoscopy must wait until she hasn't improved hemoglobin.  Plan:  CBC today. Start iron sulfate 325 mg twice daily Recheck CBC in a few weeks. We have her hemoglobin at least 8.5, we will schedule EGD and colonoscopy.  Thank you for the courtesy of this consult.  Please call me with any questions or concerns.  Nelida Meuse III

## 2015-11-24 ENCOUNTER — Other Ambulatory Visit: Payer: Self-pay

## 2015-11-24 DIAGNOSIS — D649 Anemia, unspecified: Secondary | ICD-10-CM

## 2015-11-24 NOTE — Progress Notes (Signed)
Notes Recorded by Barron Alvine, RN on 11/22/2015 at 4:17 PM Left message on machine to call back Notes Recorded by Nelida Meuse III, MD on 11/22/2015 at 12:23 PM Please let her know that her blood count is about the same as when her PCP checked in 6 weeks ago. I have already instructed her to start iron twice a day. I would like to recheck a CBC in 3 weeks. I would also like to schedule an EGD and colonoscopy in about 6 weeks, which should give enough time for the blood counts to come up in a safer range. She and I discussed that in the office today.   Pt was scheduled for labs, endo and colon as well as previsit.  She will continue twice daily Iron

## 2015-12-06 ENCOUNTER — Other Ambulatory Visit (INDEPENDENT_AMBULATORY_CARE_PROVIDER_SITE_OTHER): Payer: BLUE CROSS/BLUE SHIELD

## 2015-12-06 DIAGNOSIS — D649 Anemia, unspecified: Secondary | ICD-10-CM | POA: Diagnosis not present

## 2015-12-06 LAB — CBC WITH DIFFERENTIAL/PLATELET
BASOS PCT: 0.4 % (ref 0.0–3.0)
Basophils Absolute: 0.1 10*3/uL (ref 0.0–0.1)
EOS ABS: 0.2 10*3/uL (ref 0.0–0.7)
EOS PCT: 1.4 % (ref 0.0–5.0)
HCT: 28.4 % — ABNORMAL LOW (ref 36.0–46.0)
Hemoglobin: 8.6 g/dL — ABNORMAL LOW (ref 12.0–15.0)
Lymphocytes Relative: 32.7 % (ref 12.0–46.0)
Lymphs Abs: 4 10*3/uL (ref 0.7–4.0)
MCHC: 30.1 g/dL (ref 30.0–36.0)
MCV: 68.2 fl — ABNORMAL LOW (ref 78.0–100.0)
MONO ABS: 0.5 10*3/uL (ref 0.1–1.0)
Monocytes Relative: 3.9 % (ref 3.0–12.0)
NEUTROS ABS: 7.5 10*3/uL (ref 1.4–7.7)
Neutrophils Relative %: 61.6 % (ref 43.0–77.0)
PLATELETS: 662 10*3/uL — AB (ref 150.0–400.0)
RBC: 4.17 Mil/uL (ref 3.87–5.11)
RDW: 23.1 % — AB (ref 11.5–15.5)
WBC: 12.2 10*3/uL — ABNORMAL HIGH (ref 4.0–10.5)

## 2015-12-07 ENCOUNTER — Other Ambulatory Visit: Payer: Self-pay | Admitting: Physician Assistant

## 2015-12-21 ENCOUNTER — Ambulatory Visit (AMBULATORY_SURGERY_CENTER): Payer: Self-pay

## 2015-12-21 VITALS — Ht 64.0 in | Wt 264.0 lb

## 2015-12-21 DIAGNOSIS — D509 Iron deficiency anemia, unspecified: Secondary | ICD-10-CM

## 2015-12-21 NOTE — Progress Notes (Signed)
No allergies to eggs or soy No diet/weight loss meds No past problems with anesthesia No home oxygen  Has email and internet; registered for emmi 

## 2015-12-30 ENCOUNTER — Other Ambulatory Visit: Payer: Self-pay | Admitting: Physician Assistant

## 2016-01-02 NOTE — Telephone Encounter (Signed)
Meds ordered this encounter  Medications  . pantoprazole (PROTONIX) 40 MG tablet    Sig: TAKE 1 TABLET BY MOUTH TWICE DAILY    Dispense:  60 tablet    Refill:  0  . ALPRAZolam (XANAX) 0.5 MG tablet    Sig: TAKE 1 TABLET BY MOUTH AT BEDTIME AS NEEDED FOR SLEEP    Dispense:  30 tablet    Refill:  0

## 2016-01-03 NOTE — Telephone Encounter (Signed)
Called in.

## 2016-01-04 ENCOUNTER — Encounter: Payer: Self-pay | Admitting: Gastroenterology

## 2016-01-04 ENCOUNTER — Ambulatory Visit (AMBULATORY_SURGERY_CENTER): Payer: BLUE CROSS/BLUE SHIELD | Admitting: Gastroenterology

## 2016-01-04 VITALS — BP 105/63 | HR 79 | Temp 98.2°F | Resp 13 | Ht 64.0 in | Wt 264.0 lb

## 2016-01-04 DIAGNOSIS — Z800 Family history of malignant neoplasm of digestive organs: Secondary | ICD-10-CM | POA: Diagnosis not present

## 2016-01-04 DIAGNOSIS — D509 Iron deficiency anemia, unspecified: Secondary | ICD-10-CM

## 2016-01-04 LAB — GLUCOSE, CAPILLARY
GLUCOSE-CAPILLARY: 109 mg/dL — AB (ref 65–99)
GLUCOSE-CAPILLARY: 82 mg/dL (ref 65–99)

## 2016-01-04 MED ORDER — SODIUM CHLORIDE 0.9 % IV SOLN: 500.0000 mL | INTRAVENOUS | Status: DC

## 2016-01-04 NOTE — Progress Notes (Signed)
Report to PACU, RN, vss, BBS= Clear.  

## 2016-01-04 NOTE — Op Note (Signed)
[place] Patient Name: Emily Mathis Procedure Date: 01/04/2016 1:18 PM MRN: [id number] Endoscopist: Mallie Mussel L. Loletha Carrow , MD Age: 50 Date of Birth: 1966-03-28 Gender: Female Procedure:                Upper GI endoscopy Indications:              Unexplained iron deficiency anemia Medicines:                Monitored Anesthesia Care Procedure:                Pre-Anesthesia Assessment:                           - Prior to the procedure, a History and Physical                            was performed, and patient medications and                            allergies were reviewed. The patient's tolerance of                            previous anesthesia was also reviewed. The risks                            and benefits of the procedure and the sedation                            options and risks were discussed with the patient.                            All questions were answered, and informed consent                            was obtained. Prior Anticoagulants: The patient has                            taken no previous anticoagulant or antiplatelet                            agents. ASA Grade Assessment: II - A patient with                            mild systemic disease. After reviewing the risks                            and benefits, the patient was deemed in                            satisfactory condition to undergo the procedure.                           After obtaining informed consent, the endoscope was  passed under direct vision. Throughout the                            procedure, the patient's blood pressure, pulse, and                            oxygen saturations were monitored continuously. The                            Model GIF-HQ190 [id number]) scope was introduced                            through the mouth, and advanced to the second part                            of duodenum. The upper GI endoscopy was              accomplished without difficulty. The patient                            tolerated the procedure well. Scope In: Scope Out: Findings:                 The esophagus was normal.                           The stomach was normal.                           The examined duodenum was normal. Biopsies for                            histology were taken with a cold forceps for                            evaluation of celiac disease.                           The cardia and gastric fundus were normal on                            retroflexion. Complications:            No immediate complications. Estimated Blood Loss:     Estimated blood loss: none. Impression:               - Normal esophagus.                           - Normal stomach.                           - Normal examined duodenum. Biopsied. Recommendation:           - Resume previous diet.                           - Continue present medications.                           -  Await pathology results. Verenice Westrich L. Loletha Carrow, MD 01/04/2016 1:41:56 PM This report has been signed electronically.

## 2016-01-04 NOTE — Progress Notes (Signed)
Called to room to assist during endoscopic procedure.  Patient ID and intended procedure confirmed with present staff. Received instructions for my participation in the procedure from the performing physician.  

## 2016-01-04 NOTE — Op Note (Signed)
[facility] Patient Name: Emily Mathis Procedure Date: 01/04/2016 1:18 PM MRN: [id number] Endoscopist: Sherilyn Cooter L. Myrtie Neither , MD Age: 50 Date of Birth: Nov 06, 1965 Gender: Female Procedure:                Colonoscopy Indications:              Unexplained iron deficiency anemia Medicines:                Monitored Anesthesia Care Procedure:                Pre-Anesthesia Assessment:                           - Prior to the procedure, a History and Physical                            was performed, and patient medications and                            allergies were reviewed. The patient's tolerance of                            previous anesthesia was also reviewed. The risks                            and benefits of the procedure and the sedation                            options and risks were discussed with the patient.                            All questions were answered, and informed consent                            was obtained. Prior Anticoagulants: The patient has                            taken no previous anticoagulant or antiplatelet                            agents. ASA Grade Assessment: II - A patient with                            mild systemic disease. After reviewing the risks                            and benefits, the patient was deemed in                            satisfactory condition to undergo the procedure.                           After obtaining informed consent, the colonoscope  was passed under direct vision. Throughout the                            procedure, the patient's blood pressure, pulse, and                            oxygen saturations were monitored continuously. The                            Model CF-HQ190L [id number]) scope was introduced                            through the anus and advanced to the the cecum,                            identified by appendiceal orifice and ileocecal              valve. The colonoscopy was performed without                            difficulty. The patient tolerated the procedure                            well. The quality of the bowel preparation was                            excellent. The ileocecal valve, appendiceal                            orifice, and rectum were photographed. The bowel                            preparation used was Miralax. Scope In: 1:28:29 PM Scope Out: 1:37:25 PM Scope Withdrawal Time: 0 hours 6 minutes 27 seconds  Total Procedure Duration: 0 hours 8 minutes 56 seconds  Findings:                 The perianal and digital rectal examinations were                            normal.                           The entire examined colon appeared normal on direct                            and retroflexion views. Complications:            No immediate complications. Estimated Blood Loss:     Estimated blood loss: none. Impression:               - The entire examined colon is normal on direct and                            retroflexion views.                           -  No specimens collected.                           - No cause for anemia was discovered on either EGD                            or colonoscopy. Recommendation:           - Patient has a contact number available for                            emergencies. The signs and symptoms of potential                            delayed complications were discussed with the                            patient. Return to normal activities tomorrow.                            Written discharge instructions were provided to the                            patient.                           - Resume previous diet.                           - Continue present medications.                           - Repeat colonoscopy in 10 years for screening                            purposes.                           - Consult with gynecology regarding your heavy                             menses, which appears to be the cause of your                            anemia. Mylissa Lambe L. Myrtie Neither, MD 01/04/2016 1:45:27 PM This report has been signed electronically.

## 2016-01-04 NOTE — Progress Notes (Signed)
Pt's blood sugar was 82.  She was not having any symptoms.  When pt has wake and sitting in fowlers position she was given cran-grape juice and peanutbutter and crackers.  Maw  No problems noted in the recovery room. maw

## 2016-01-04 NOTE — Patient Instructions (Addendum)

## 2016-01-05 ENCOUNTER — Telehealth: Payer: Self-pay | Admitting: *Deleted

## 2016-01-05 NOTE — Telephone Encounter (Signed)
  Follow up Call-  Call back number 01/04/2016  Post procedure Call Back phone  # [phone]  Permission to leave phone message Yes     Patient questions:  Do you have a fever, pain , or abdominal swelling? No. Pain Score  0 *  Have you tolerated food without any problems? Yes.    Have you been able to return to your normal activities? Yes.    Do you have any questions about your discharge instructions: Diet   No. Medications  No. Follow up visit  No.  Do you have questions or concerns about your Care? No.  Actions: * If pain score is 4 or above: No action needed, pain <4.

## 2016-01-09 ENCOUNTER — Encounter: Payer: Self-pay | Admitting: Gastroenterology

## 2016-02-14 ENCOUNTER — Encounter: Payer: Self-pay | Admitting: Physician Assistant

## 2016-02-15 ENCOUNTER — Ambulatory Visit (INDEPENDENT_AMBULATORY_CARE_PROVIDER_SITE_OTHER): Payer: BLUE CROSS/BLUE SHIELD | Admitting: Physician Assistant

## 2016-02-15 VITALS — BP 124/76 | HR 110 | Temp 98.1°F | Resp 17 | Ht 64.0 in | Wt 268.0 lb

## 2016-02-15 DIAGNOSIS — M791 Myalgia, unspecified site: Secondary | ICD-10-CM

## 2016-02-15 DIAGNOSIS — E890 Postprocedural hypothyroidism: Secondary | ICD-10-CM | POA: Diagnosis not present

## 2016-02-15 DIAGNOSIS — D509 Iron deficiency anemia, unspecified: Secondary | ICD-10-CM | POA: Diagnosis not present

## 2016-02-15 DIAGNOSIS — N939 Abnormal uterine and vaginal bleeding, unspecified: Secondary | ICD-10-CM

## 2016-02-15 DIAGNOSIS — E119 Type 2 diabetes mellitus without complications: Secondary | ICD-10-CM

## 2016-02-15 DIAGNOSIS — N921 Excessive and frequent menstruation with irregular cycle: Secondary | ICD-10-CM | POA: Diagnosis not present

## 2016-02-15 DIAGNOSIS — S025XXB Fracture of tooth (traumatic), initial encounter for open fracture: Secondary | ICD-10-CM

## 2016-02-15 DIAGNOSIS — N920 Excessive and frequent menstruation with regular cycle: Secondary | ICD-10-CM

## 2016-02-15 DIAGNOSIS — R5382 Chronic fatigue, unspecified: Secondary | ICD-10-CM | POA: Diagnosis not present

## 2016-02-15 HISTORY — DX: Iron deficiency anemia, unspecified: D50.9

## 2016-02-15 LAB — POCT CBC
GRANULOCYTE PERCENT: 64.7 % (ref 37–80)
HEMATOCRIT: 23.1 % — AB (ref 37.7–47.9)
Hemoglobin: 7.1 g/dL — AB (ref 12.2–16.2)
LYMPH, POC: 3.3 (ref 0.6–3.4)
MCH, POC: 21.7 pg — AB (ref 27–31.2)
MCHC: 30.7 g/dL — AB (ref 31.8–35.4)
MCV: 70.6 fL — AB (ref 80–97)
MID (CBC): 0.4 (ref 0–0.9)
MPV: 6.9 fL (ref 0–99.8)
POC GRANULOCYTE: 6.9 (ref 2–6.9)
POC LYMPH %: 31.1 % (ref 10–50)
POC MID %: 4.2 % (ref 0–12)
Platelet Count, POC: 589 10*3/uL — AB (ref 142–424)
RBC: 3.27 M/uL — AB (ref 4.04–5.48)
RDW, POC: 20.7 %
WBC: 10.6 10*3/uL — AB (ref 4.6–10.2)

## 2016-02-15 MED ORDER — NORETHIN ACE-ETH ESTRAD-FE 1.5-30 MG-MCG PO TABS: 1.0000 | ORAL_TABLET | Freq: Every day | ORAL | Status: DC

## 2016-02-15 NOTE — Progress Notes (Signed)
Patient ID: Emily Mathis, female    DOB: 1965/10/04, 50 y.o.   MRN: [id number]  PCP: Olene Floss  Subjective:   Chief Complaint  Patient presents with  . Dental Pain    hx of diabetes     HPI Patient presents today with complaints of irregular, heavy menses for the last 4 weeks.   She describes heavy bleeding, using pads and super plus tampons that need to be changed every 20-60 min, large clots that are not bigger than her thumb, but that she has a very difficult time estimating (she took photos, but without any other object for reference). Prior to this heavy bleeding, her periods had become quite light and infrequent.  She was diagnosed with iron deficiency anemia prior to the onset of this bleeding, and was referred to GI. She is on iron supplementation. S/p upper endoscopy & colonscopy, without cause for anemia found.  Body/bone aches - so severe she cant get out of bed, unable to work. Her daughter now cares for her. She has been worked up by rheumatology due to a positive ANA, but no clear etiology identified. As her TSH has remained elevated, it was decided to wait for further rheumatology evaluation until that was controlled. Last TSH was with endocrinology in 11/2015 and was reportedly 19 (this is from the rheumatology note, as I have no records from the endocrinologist). The patient states that her levothyroxine dose was adjusted at that visit but she has not returned for follow-up labs and no visit is scheduled.  Back tooth broke off & causing severe pain. She has not contacted a dentist.  Gaining weight, pain with ambulation - hip & femur bones are worse pain Pain in sternum and right and left shoulder Shortness of breath with ambulation Dizziness with standing,  Nausea with eating, no vomiting  No fever, no change in urination or bowel movements,    Review of Systems As above.    Patient Active Problem List   Diagnosis Date Noted  . Iron deficiency  anemia 02/15/2016  . Myalgia 02/15/2016  . Upper airway cough syndrome 10/21/2015  . Polyarthralgia 06/16/2015  . Diabetes mellitus type 2, uncomplicated (HCC) 09/14/2013  . Keloid scar, cervical incision 09/02/2013  . Reaction, situational, acute, to stress 07/02/2013  . 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 Severe obesity (BMI >= 40) (HCC) 03/27/2013  . GERD (gastroesophageal reflux disease) 02/15/2013  . Hypocalcemia 01/07/2013  . Chest pain on exertion 07/24/2011     Prior to Admission medications   Medication Sig Start Date End Date Taking? Authorizing Provider  albuterol (PROVENTIL HFA;VENTOLIN HFA) 108 (90 BASE) MCG/ACT inhaler Inhale 2 puffs into the lungs daily as needed. Reported on 01/04/2016   Yes Historical Provider, MD  ALPRAZolam Prudy Feeler) 0.5 MG tablet TAKE 1 TABLET BY MOUTH AT BEDTIME AS NEEDED FOR SLEEP 01/02/16  Yes Tryone Kille, PA-C  chlorthalidone (HYGROTON) 25 MG tablet Take 1 tablet (25 mg total) by mouth daily. 08/23/15  Yes Belanna Manring, PA-C  diazepam (VALIUM) 5 MG tablet TAKE 1 TABLET BY MOUTH EVERY 6 HOURS AS NEEDED FOR MUSCLE SPASMS 08/23/15  Yes Shakyia Bosso, PA-C  INVOKAMET 150-500 MG TABS TAKE 1 TABLET BY MOUTH ONCE DAILY 09/26/15  Yes Nickalus Thornsberry, PA-C  levothyroxine (SYNTHROID, LEVOTHROID) 300 MCG tablet Take 1 tablet (300 mcg total) by mouth daily. 06/17/15  Yes Rihana Kiddy, PA-C  nystatin (MYCOSTATIN) powder Apply topically 4 (four) times daily. 10/02/15  Yes Cher Franzoni, PA-C  pantoprazole (PROTONIX) 40 MG tablet  TAKE 1 TABLET BY MOUTH TWICE DAILY 12/30/15  Yes Tekelia Kareem, PA-C  sertraline (ZOLOFT) 100 MG tablet Take 1 tablet (100 mg total) by mouth daily. 03/11/15  Yes Nyan Dufresne, PA-C  sucralfate (CARAFATE) 1 G tablet Take 1 tablet (1 g total) by mouth 4 (four) times daily. 04/19/15  Yes Marisa Severin, MD  OTC iron tablet Take one daily   yes   traMADol (ULTRAM) 50 MG tablet Take 50 mg by mouth every 6 (six) hours as needed for moderate  pain. Reported on 02/15/2016    Historical Provider, MD     Allergies  Allergen Reactions  . Latex     Itchy & leaves marks on skin  . Dilaudid [Hydromorphone Hcl] Itching  . Hydrocodone Itching       Objective:  Physical Exam  Constitutional: She is oriented to person, place, and time. She appears well-developed and well-nourished. She is active and cooperative. No distress.  BP 124/76 mmHg  Pulse 110  Temp(Src) 98.1 F (36.7 C) (Oral)  Resp 17  Ht 5\' 4"  (1.626 m)  Wt 268 lb (121.564 kg)  BMI 45.98 kg/m2  SpO2 100%  HENT:  Head: Normocephalic and atraumatic.  Right Ear: Hearing normal.  Left Ear: Hearing normal.  Mouth/Throat: Uvula is midline, oropharynx is clear and moist and mucous membranes are normal. No oral lesions. Abnormal dentition. No uvula swelling.  Eyes: Conjunctivae are normal. No scleral icterus.  Neck: Normal range of motion. Neck supple. No thyromegaly present.  Cardiovascular: Regular rhythm and normal heart sounds.  Tachycardia present.   Pulses:      Radial pulses are 2+ on the right side, and 2+ on the left side.  Pulmonary/Chest: Effort normal and breath sounds normal.  Lymphadenopathy:       Head (right side): No tonsillar, no preauricular, no posterior auricular and no occipital adenopathy present.       Head (left side): No tonsillar, no preauricular, no posterior auricular and no occipital adenopathy present.    She has no cervical adenopathy.       Right: No supraclavicular adenopathy present.       Left: No supraclavicular adenopathy present.  Neurological: She is alert and oriented to person, place, and time. No sensory deficit.  Skin: Skin is warm, dry and intact. No rash noted. No cyanosis or erythema. Nails show no clubbing.  Psychiatric: Her speech is normal and behavior is normal. Judgment and thought content normal. Her mood appears not anxious. Her affect is not angry, not blunt, not labile and not inappropriate. Cognition and memory  are normal. She exhibits a depressed mood.  She was crying initially, but calmed considerably during the visit. Continues to behave and relate a sense of helplessness (as in she has a broken tooth but seemed not to know what to do and waited for her visit with me rather than scheduling with a dentist, despite reported terrible pain; not contacting the endocrinology office about follow up).       Results for orders placed or performed in visit on 02/15/16  POCT CBC  Result Value Ref Range   WBC 10.6 (A) 4.6 - 10.2 K/uL   Lymph, poc 3.3 0.6 - 3.4   POC LYMPH PERCENT 31.1 10 - 50 %L   MID (cbc) 0.4 0 - 0.9   POC MID % 4.2 0 - 12 %M   POC Granulocyte 6.9 2 - 6.9   Granulocyte percent 64.7 37 - 80 %G   RBC 3.27 (A)  4.04 - 5.48 M/uL   Hemoglobin 7.1 (A) 12.2 - 16.2 g/dL   HCT, POC 08.6 (A) 57.8 - 47.9 %   MCV 70.6 (A) 80 - 97 fL   MCH, POC 21.7 (A) 27 - 31.2 pg   MCHC 30.7 (A) 31.8 - 35.4 g/dL   RDW, POC 46.9 %   Platelet Count, POC 589 (A) 142 - 424 K/uL   MPV 6.9 0 - 99.8 fL       Assessment & Plan:   1. Excessive vaginal bleeding Start COC. Refer to GYN for additional evaluation and treatment. - POCT CBC - Ambulatory referral to Gynecology - norethindrone-ethinyl estradiol-iron (MICROGESTIN FE,GILDESS FE,LOESTRIN FE) 1.5-30 MG-MCG tablet; Take 1 tablet by mouth daily.  Dispense: 1 Package; Refill: 12 - POCT urine pregnancy  2. Chronic fatigue Likely due to subtherapeutic thyroid replacement since thyroidectomy, exacerbated by significant iron deficiency anemia, and probably also by her depressed mood. - Comprehensive metabolic panel  3. Iron deficiency anemia Worsened. Increase Fe to BID. COC and GYN referral as above. - POCT CBC  4. Hypothyroidism, postsurgical Await TSH. She will contact endocrinology with these results for either follow-up appointment or dose adjustment and follow-up plan. - TSH  5. Type 2 diabetes mellitus without complication, without long-term  current use of insulin (HCC) Await A1C. - Hemoglobin A1c  6. Myalgia Unclear etiology. Possibly hypothyroidism, also consider fibromyalgia. Once TSH has normalized, we'll get her back in with rheumatology for additional evaluation if the pain persists.  7. Broken tooth, open, initial encounter Advised she seek dental evaluation as soon as she is able.   Fernande Bras, PA-C Physician Assistant-Certified [facility]

## 2016-02-15 NOTE — Patient Instructions (Addendum)
1. Please call a dentist ASAP to get your teeth looked at!  2. INCREASE the Iron pill, take ONE tablet TWICE each day.  3. The GYN office will call you to schedule a visit with them, to better understand why you are bleeding.  4. I've ordered a birth control pill to see if that will help the bleeding. It doesn't work right away-it will take weeks.  5. Try taking the alprazolam when you go to bed, instead of waiting several hours. I think that will help you sleep, but not last too long that you can't get up in the morning.  6. Call Dr. Forde Dandy to schedule a follow-up visit. It's ok to wait until we get the results.    IF you received an x-ray today, you will receive an invoice from [facility]. Please contact [facility] at [phone] with questions or concerns regarding your invoice.   IF you received labwork today, you will receive an invoice from [employer]. Please contact [facility] at [phone] with questions or concerns regarding your invoice.   Our billing staff will not be able to assist you with questions regarding bills from these companies.  You will be contacted with the lab results as soon as they are available. The fastest way to get your results is to activate your My Chart account. Instructions are located on the last page of this paperwork. If you have not heard from [place] regarding the results in 2 weeks, please contact this office.     We recommend that you schedule a mammogram for breast cancer screening. Typically, you do not need a referral to do this. Please contact a local imaging center to schedule your mammogram.  [facility] - [phone]  *ask for the Radiology Department [facility] ([place]) - [phone] or [phone]  [facility] - [phone] [place] [phone] [facility] - [phone]  *ask for the [facility] - [phone]  *ask for the Radiology Department [facility] - [phone]  *ask for the [facility] - [phone]

## 2016-02-15 NOTE — Progress Notes (Signed)
Subjective:    Patient ID: Emily Mathis, female    DOB: [birth year], 50 y.o.   MRN: [id number]  Chief Complaint  Patient presents with  . Dental Pain    hx of diabetes    HPI  Patient presents today with complaints of irregular, heavy menses for the last 4 weeks.  She describes cramping, heavy bleeding, using pads and super plus tampons that need to be changed every 20-60 min, as well as passing a lot of large clots that are no bigger than her thumb.  She has a history of irregular, heavy periods but they don't last this long.    Review of her notes shows a history of anemia, identified earlier this year. Patient was instructed to take an OTC iron supplement daily, she is currently taking it once a day.  S/p upper endoscopy & colonoscopy.  Results indicate no source of anemia found, so she was referred to gynecology.  Body/bone aches - so severe she cant get out of bed, has difficultly ambulating, unable to work, gaining weight. It is very stressful, and depressing for her, she has to depend on her daughter to provide for her and it is not a position she like to be in. Patient has polyarthralgia, and is ANA positive, per rheumatologist who has been unable to determine a definitive etiology.  Patient states her TSH levels are being managed by endocrinologist, who she has seen recently.  Unable to view any notes from them but the most recent TSH reported in rheumatologist note is 39 in March 2017.   Recently her back tooth cracked, then half of it broke off this morning, causing severe oral pain.  Pain in sternum and right and left shoulder. Dizziness with standing, and shortness of breath with ambulation. Nausea with eating, no vomiting.  No fever, no change in urination or bowel movements.   Review of Systems All others negative except those listed in HPI.  Patient Active Problem List   Diagnosis Date Noted  . Iron deficiency anemia 02/15/2016  . Myalgia 02/15/2016  . Upper  airway cough syndrome 10/21/2015  . Polyarthralgia 06/16/2015  . Diabetes mellitus type 2, uncomplicated ([place]) [id number]  . Keloid scar, cervical incision 09/02/2013  . Reaction, situational, acute, to stress 07/02/2013  . 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 Severe obesity (BMI >= 40) ([place]) 03/27/2013  . GERD (gastroesophageal reflux disease) 02/15/2013  . Hypocalcemia 01/07/2013  . Chest pain on exertion 07/24/2011   Current Outpatient Prescriptions on File Prior to Visit  Medication Sig Dispense Refill  . albuterol (PROVENTIL HFA;VENTOLIN HFA) 108 (90 BASE) MCG/ACT inhaler Inhale 2 puffs into the lungs daily as needed. Reported on 01/04/2016    . ALPRAZolam (XANAX) 0.5 MG tablet TAKE 1 TABLET BY MOUTH AT BEDTIME AS NEEDED FOR SLEEP 30 tablet 0  . chlorthalidone (HYGROTON) 25 MG tablet Take 1 tablet (25 mg total) by mouth daily. 90 tablet 0  . diazepam (VALIUM) 5 MG tablet TAKE 1 TABLET BY MOUTH EVERY 6 HOURS AS NEEDED FOR MUSCLE SPASMS 30 tablet 0  . INVOKAMET 150-500 MG TABS TAKE 1 TABLET BY MOUTH ONCE DAILY 30 tablet 0  . levothyroxine (SYNTHROID, LEVOTHROID) 300 MCG tablet Take 1 tablet (300 mcg total) by mouth daily. 90 tablet 3  . nystatin (MYCOSTATIN) powder Apply topically 4 (four) times daily. 60 g 2  . pantoprazole (PROTONIX) 40 MG tablet TAKE 1 TABLET BY MOUTH TWICE DAILY 60 tablet 0  . sertraline (ZOLOFT) 100 MG tablet Take 1  tablet (100 mg total) by mouth daily. 90 tablet 1  . sucralfate (CARAFATE) 1 G tablet Take 1 tablet (1 g total) by mouth 4 (four) times daily. 30 tablet 0  . traMADol (ULTRAM) 50 MG tablet Take 50 mg by mouth every 6 (six) hours as needed for moderate pain. Reported on 02/15/2016     No current facility-administered medications on file prior to visit.   Allergies  Allergen Reactions  . Latex     Itchy & leaves marks on skin  . Dilaudid [Hydromorphone Hcl] Itching  . Hydrocodone Itching      Objective: BP 124/76 mmHg  Pulse 110  Temp(Src)  98.1 F (36.7 C) (Oral)  Resp 17  Ht 5\' 4"  (1.626 m)  Wt 268 lb (121.564 kg)  BMI 45.98 kg/m2  SpO2 100%   Physical Exam  Constitutional: She appears well-developed and well-nourished.  HENT:  Mouth/Throat: Abnormal dentition.  Neck: Normal range of motion. No thyromegaly present.  Cardiovascular: Regular rhythm, normal heart sounds, intact distal pulses and normal pulses.  Tachycardia present.   Pulmonary/Chest: Effort normal and breath sounds normal.  Psychiatric: Her speech is normal. She exhibits a depressed mood. She expresses no homicidal and no suicidal ideation.  Tearful on exam.    Results for orders placed or performed in visit on 02/15/16 (from the past 24 hour(s))  POCT CBC     Status: Abnormal   Collection Time: 02/15/16  5:52 PM  Result Value Ref Range   WBC 10.6 (A) 4.6 - 10.2 K/uL   Lymph, poc 3.3 0.6 - 3.4   POC LYMPH PERCENT 31.1 10 - 50 %L   MID (cbc) 0.4 0 - 0.9   POC MID % 4.2 0 - 12 %M   POC Granulocyte 6.9 2 - 6.9   Granulocyte percent 64.7 37 - 80 %G   RBC 3.27 (A) 4.04 - 5.48 M/uL   Hemoglobin 7.1 (A) 12.2 - 16.2 g/dL   HCT, POC 23.1 (A) 37.7 - 47.9 %   MCV 70.6 (A) 80 - 97 fL   MCH, POC 21.7 (A) 27 - 31.2 pg   MCHC 30.7 (A) 31.8 - 35.4 g/dL   RDW, POC 20.7 %   Platelet Count, POC 589 (A) 142 - 424 K/uL   MPV 6.9 0 - 99.8 fL    Assessment & Plan:  1. Excessive vaginal bleeding  Labs drawn and resulted.  Started on OCP to help control bleeding and referred to GYN for further evaluation and treatment. - POCT CBC  - Ambulatory referral to Gynecology  - norethindrone-ethinyl estradiol-iron (MICROGESTIN FE,GILDESS FE,LOESTRIN FE) 1.5-30 MG-MCG tablet; Take 1 tablet by mouth daily. Dispense: 1 Package; Refill: 12  - POCT urine pregnancy   2. Chronic fatigue  Lab results pending.   Contributing factors most likely uncontrolled hypothyroidism following thyroidectomy, compounded with significant iron deficiency anemia, and current mental state. -  Comprehensive metabolic panel  - TSH  3. Iron deficiency anemia  Worsened since last recorded. Increase Iron supplement to BID. Added OCP and referred to GYN for further workup and treatment. - POCT CBC   4. Hypothyroidism, postsurgical  TSH pending.  Patient instructed to contact her endocrinologist with these results for either follow-up appointment or dose adjustment and follow-up plan.  - TSH   5. Type 2 diabetes mellitus without complication, without long-term current use of insulin (HCC)  A1C pending, management will be adjusted as indicated.  - Hemoglobin A1c   6. Myalgia  Unclear etiology. Possibly  hypothyroidism, also consider fibromyalgia.   7. Broken tooth, open, initial encounter  Counseled patient to contact a dentist for evaluation and treatment.  Clester Chlebowski P. Preciosa Bundrick, PA-S

## 2016-02-16 LAB — TSH: TSH: 4.37 m[IU]/L

## 2016-02-16 LAB — HEMOGLOBIN A1C
Hgb A1c MFr Bld: 6.9 % — ABNORMAL HIGH (ref <5.7)
Mean Plasma Glucose: 151 mg/dL

## 2016-02-16 LAB — COMPREHENSIVE METABOLIC PANEL
ALT: 13 U/L (ref 6–29)
AST: 15 U/L (ref 10–35)
Albumin: 3.7 g/dL (ref 3.6–5.1)
Alkaline Phosphatase: 67 U/L (ref 33–115)
BILIRUBIN TOTAL: 0.2 mg/dL (ref 0.2–1.2)
BUN: 14 mg/dL (ref 7–25)
CHLORIDE: 103 mmol/L (ref 98–110)
CO2: 24 mmol/L (ref 20–31)
CREATININE: 0.79 mg/dL (ref 0.50–1.10)
Calcium: 8.3 mg/dL — ABNORMAL LOW (ref 8.6–10.2)
GLUCOSE: 103 mg/dL — AB (ref 65–99)
Potassium: 4.4 mmol/L (ref 3.5–5.3)
SODIUM: 138 mmol/L (ref 135–146)
Total Protein: 7.4 g/dL (ref 6.1–8.1)

## 2016-02-17 ENCOUNTER — Encounter: Payer: Self-pay | Admitting: Physician Assistant

## 2016-02-19 ENCOUNTER — Other Ambulatory Visit: Payer: Self-pay | Admitting: Physician Assistant

## 2016-02-20 ENCOUNTER — Ambulatory Visit (INDEPENDENT_AMBULATORY_CARE_PROVIDER_SITE_OTHER): Payer: BLUE CROSS/BLUE SHIELD | Admitting: Obstetrics and Gynecology

## 2016-02-20 ENCOUNTER — Encounter: Payer: Self-pay | Admitting: Obstetrics and Gynecology

## 2016-02-20 ENCOUNTER — Other Ambulatory Visit: Payer: Self-pay | Admitting: Physician Assistant

## 2016-02-20 ENCOUNTER — Other Ambulatory Visit: Payer: Self-pay | Admitting: Obstetrics and Gynecology

## 2016-02-20 ENCOUNTER — Emergency Department (HOSPITAL_COMMUNITY)
Admission: EM | Admit: 2016-02-20 | Discharge: 2016-02-20 | Disposition: A | Discharge status: Home / Self Care | Payer: BLUE CROSS/BLUE SHIELD | Attending: Emergency Medicine | Admitting: Emergency Medicine

## 2016-02-20 ENCOUNTER — Emergency Department (HOSPITAL_COMMUNITY): Payer: BLUE CROSS/BLUE SHIELD

## 2016-02-20 ENCOUNTER — Encounter (HOSPITAL_COMMUNITY): Payer: Self-pay | Admitting: Emergency Medicine

## 2016-02-20 VITALS — BP 122/78 | HR 100 | Resp 25 | Ht 63.0 in | Wt 267.7 lb

## 2016-02-20 DIAGNOSIS — R079 Chest pain, unspecified: Secondary | ICD-10-CM | POA: Insufficient documentation

## 2016-02-20 DIAGNOSIS — Z9289 Personal history of other medical treatment: Secondary | ICD-10-CM

## 2016-02-20 DIAGNOSIS — Z8543 Personal history of malignant neoplasm of ovary: Secondary | ICD-10-CM | POA: Diagnosis not present

## 2016-02-20 DIAGNOSIS — I10 Essential (primary) hypertension: Secondary | ICD-10-CM | POA: Diagnosis not present

## 2016-02-20 DIAGNOSIS — N921 Excessive and frequent menstruation with irregular cycle: Secondary | ICD-10-CM

## 2016-02-20 DIAGNOSIS — D500 Iron deficiency anemia secondary to blood loss (chronic): Secondary | ICD-10-CM | POA: Insufficient documentation

## 2016-02-20 DIAGNOSIS — N939 Abnormal uterine and vaginal bleeding, unspecified: Secondary | ICD-10-CM | POA: Diagnosis present

## 2016-02-20 DIAGNOSIS — J45909 Unspecified asthma, uncomplicated: Secondary | ICD-10-CM | POA: Insufficient documentation

## 2016-02-20 DIAGNOSIS — E119 Type 2 diabetes mellitus without complications: Secondary | ICD-10-CM | POA: Insufficient documentation

## 2016-02-20 DIAGNOSIS — D649 Anemia, unspecified: Secondary | ICD-10-CM | POA: Diagnosis not present

## 2016-02-20 HISTORY — DX: Personal history of other medical treatment: Z92.89

## 2016-02-20 HISTORY — DX: Anemia, unspecified: D64.9

## 2016-02-20 LAB — CBC WITH DIFFERENTIAL/PLATELET
BASOS ABS: 0 10*3/uL (ref 0.0–0.1)
BASOS PCT: 0 %
EOS ABS: 0.3 10*3/uL (ref 0.0–0.7)
Eosinophils Relative: 2 %
HCT: 24.5 % — ABNORMAL LOW (ref 36.0–46.0)
Hemoglobin: 6.9 g/dL — CL (ref 12.0–15.0)
LYMPHS ABS: 3.8 10*3/uL (ref 0.7–4.0)
Lymphocytes Relative: 30 %
MCH: 20.7 pg — ABNORMAL LOW (ref 26.0–34.0)
MCHC: 28.2 g/dL — AB (ref 30.0–36.0)
MCV: 73.4 fL — AB (ref 78.0–100.0)
MONO ABS: 0.6 10*3/uL (ref 0.1–1.0)
Monocytes Relative: 5 %
NEUTROS PCT: 63 %
Neutro Abs: 7.8 10*3/uL — ABNORMAL HIGH (ref 1.7–7.7)
PLATELETS: 720 10*3/uL — AB (ref 150–400)
RBC: 3.34 MIL/uL — AB (ref 3.87–5.11)
RDW: 20.7 % — AB (ref 11.5–15.5)
WBC: 12.5 10*3/uL — AB (ref 4.0–10.5)

## 2016-02-20 LAB — I-STAT TROPONIN, ED: Troponin i, poc: 0 ng/mL (ref 0.00–0.08)

## 2016-02-20 LAB — COMPREHENSIVE METABOLIC PANEL
ALT: 17 U/L (ref 14–54)
ANION GAP: 7 (ref 5–15)
AST: 19 U/L (ref 15–41)
Albumin: 3.3 g/dL — ABNORMAL LOW (ref 3.5–5.0)
Alkaline Phosphatase: 68 U/L (ref 38–126)
BUN: 12 mg/dL (ref 6–20)
CHLORIDE: 108 mmol/L (ref 101–111)
CO2: 23 mmol/L (ref 22–32)
Calcium: 8.1 mg/dL — ABNORMAL LOW (ref 8.9–10.3)
Creatinine, Ser: 0.79 mg/dL (ref 0.44–1.00)
Glucose, Bld: 137 mg/dL — ABNORMAL HIGH (ref 65–99)
POTASSIUM: 3.8 mmol/L (ref 3.5–5.1)
Sodium: 138 mmol/L (ref 135–145)
Total Bilirubin: 0.5 mg/dL (ref 0.3–1.2)
Total Protein: 7.9 g/dL (ref 6.5–8.1)

## 2016-02-20 LAB — PROTIME-INR
INR: 1.07 (ref 0.00–1.49)
PROTHROMBIN TIME: 14.1 s (ref 11.6–15.2)

## 2016-02-20 LAB — ABO/RH: ABO/RH(D): O POS

## 2016-02-20 LAB — TSH: TSH: 0.759 u[IU]/mL (ref 0.350–4.500)

## 2016-02-20 LAB — TROPONIN I

## 2016-02-20 LAB — BRAIN NATRIURETIC PEPTIDE: B NATRIURETIC PEPTIDE 5: 125.6 pg/mL — AB (ref 0.0–100.0)

## 2016-02-20 LAB — PREPARE RBC (CROSSMATCH)

## 2016-02-20 MED ORDER — SODIUM CHLORIDE 0.9 % IV SOLN: 10.0000 mL/h | Freq: Once | INTRAVENOUS | Status: DC

## 2016-02-20 MED ORDER — NITROGLYCERIN 0.4 MG SL SUBL: 0.4000 mg | SUBLINGUAL_TABLET | SUBLINGUAL | Status: DC | PRN

## 2016-02-20 MED ORDER — NAPROXEN 250 MG PO TABS: 250.0000 mg | ORAL_TABLET | Freq: Once | ORAL | Status: DC

## 2016-02-20 NOTE — Progress Notes (Signed)
Patient ID: Emily Mathis, female   DOB: [birth year], 50 y.o.   MRN: [id number] GYNECOLOGY  VISIT   HPI: 50 y.o.   Single  African American  female   [id number] with Patient's last menstrual period was 02/18/2016 (exact date).   here for abnormal/irregular uterine bleeding.  Patient states she has always had irregular cycles.  Recently very heavy bleeding with clots, abdominal pain and patient states hemoglobin of 7.0.  She feels extremely fatigued.  Patient is on combined OCPs, since yesterday.  Always had problems for with her menses.  Menses - December 2016 (blood clot and then stopped). January 2017 - bled for a couple of days off and on.  Bleed for 3 - 4 days total and not in a row.  February, March, and April had the same pattern.  May - had spotting off and on.   Then developed heavy bleeding and blood clots.   States    Hx thyroidectomy.   Hgb A1C 5.9 in May 2017.  Has hx HTN.   Hgb 7.1 on 02/15/16. Feels dizzy, palpitations, and some shortness of breath.  Taking iron sulfate 325 mg po bid.  Did a GI evaluation with [facility] - colonoscopy and endoscopy and these were normal.   Did a cardiac catheterization for low calcium following her thyroidectomy.   PCP - Harrison Mons  GYNECOLOGIC HISTORY: Patient's last menstrual period was 02/18/2016 (exact date). Contraception:  None/OCP--Microgestin--just started Menopausal hormone therapy:  n/a Last mammogram:  07-31-12 Neg/[facility] Last pap smear:  ??10 years ago which she states was abnormal at [facility] Planning--has history of cryotherapy to cervix in 1990.        OB History    Gravida Para Term Preterm AB TAB SAB Ectopic Multiple Living   3 3 3                 Patient Active Problem List   Diagnosis Date Noted  . Iron deficiency anemia 02/15/2016  . Myalgia 02/15/2016  . Upper airway cough syndrome 10/21/2015  . Polyarthralgia 06/16/2015  . Diabetes mellitus type 2, uncomplicated ([place]) [id number]  .  Keloid scar, cervical incision 09/02/2013  . Reaction, situational, acute, to stress 07/02/2013  . 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 Severe obesity (BMI >= 40) ([place]) 03/27/2013  . GERD (gastroesophageal reflux disease) 02/15/2013  . Hypocalcemia 01/07/2013  . Chest pain on exertion 07/24/2011    Past Medical History  Diagnosis Date  . Hyperlipidemia   . Asthma   . Arthritis   . Hypertension     per Dr. Irven Shelling note 08/30/2011  . Anxiety   . GERD (gastroesophageal reflux disease)   . Headache(784.0)   . 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 Diabetes mellitus without complication ([place])   . Neuromuscular disorder ([place])   . Fibroid   . Abnormal uterine bleeding   . Anemia   . Abnormal Pap smear of cervix 1990    Hx cryotherapy to cervix  . Thyroid disease     Hx 3 goiters--hx thyroidectomy--sees Dr. Forde Dandy    Past Surgical History  Procedure Laterality Date  . Cesarean section  1995  . Cesarean section  1996  . Cardiac catheterization  2013  . Thyroidectomy N/A 12/18/2012    Procedure: TOTAL THYROIDECTOMY;  Surgeon: Earnstine Regal, MD;  Location: [facility];  Service: General;  Laterality: N/A;  . Left heart catheterization with coronary angiogram  07/24/2011    Procedure: LEFT HEART CATHETERIZATION WITH CORONARY ANGIOGRAM;  Surgeon:  Laverda Page, MD;  Location: [facility] CATH LAB;  Service: Cardiovascular;;    Current Outpatient Prescriptions  Medication Sig Dispense Refill  . albuterol (PROVENTIL HFA;VENTOLIN HFA) 108 (90 BASE) MCG/ACT inhaler Inhale 2 puffs into the lungs daily as needed. Reported on 01/04/2016    . ALPRAZolam (XANAX) 0.5 MG tablet TAKE 1 TABLET BY MOUTH AT BEDTIME AS NEEDED FOR SLEEP 30 tablet 0  . chlorthalidone (HYGROTON) 25 MG tablet Take 1 tablet (25 mg total) by mouth daily. 90 tablet 0  . diazepam (VALIUM) 5 MG tablet TAKE 1 TABLET BY MOUTH EVERY 6 HOURS AS NEEDED FOR MUSCLE SPASMS 30 tablet 0  . Ferrous Sulfate (IRON) 325 (65 Fe) MG TABS Take 1  tablet by mouth daily.    . INVOKAMET 150-500 MG TABS TAKE 1 TABLET BY MOUTH ONCE DAILY 30 tablet 0  . IRON, FERROUS GLUCONATE, PO Take by mouth daily.    Marland Kitchen levothyroxine (SYNTHROID, LEVOTHROID) 300 MCG tablet Take 1 tablet (300 mcg total) by mouth daily. 90 tablet 3  . lisinopril (PRINIVIL,ZESTRIL) 2.5 MG tablet Take 1 tablet by mouth daily.  1  . norethindrone-ethinyl estradiol-iron (MICROGESTIN FE,GILDESS FE,LOESTRIN FE) 1.5-30 MG-MCG tablet Take 1 tablet by mouth daily. 1 Package 12  . nystatin (MYCOSTATIN) powder Apply topically 4 (four) times daily. 60 g 2  . pantoprazole (PROTONIX) 40 MG tablet TAKE 1 TABLET BY MOUTH TWICE DAILY 60 tablet 0  . sertraline (ZOLOFT) 100 MG tablet Take 1 tablet (100 mg total) by mouth daily. 90 tablet 1  . sucralfate (CARAFATE) 1 G tablet Take 1 tablet (1 g total) by mouth 4 (four) times daily. 30 tablet 0   No current facility-administered medications for this visit.     ALLERGIES: Latex; Dilaudid; and Hydrocodone  Family History  Problem Relation Age of Onset  . Heart disease Mother   . Stroke Mother   . Hypertension Mother   . Heart disease Father   . Colon cancer Father   . Hypertension Father   . Diabetes Father   . Breast cancer Paternal Aunt   . Cancer Paternal Grandmother   . Cancer Paternal Aunt   . Asthma Daughter   . Seizures Daughter   . Hypertension Sister   . Thyroid disease Sister   . Mental illness Daughter 68    suicide attempt 06/2013  . Asthma Sister   . Hypertension Sister     Social History   Social History  . Marital Status: Single    Spouse Name: n/a  . Number of Children: 4  . Years of Education: N/A   Occupational History  . DAY-CARE TEACHER   . receiving (returns)     [employer]   Social History Main Topics  . Smoking status: Never Smoker   . Smokeless tobacco: Never Used  . Alcohol Use: 0.0 oz/week    0 Standard drinks or equivalent per week     Comment: Occasional glass of wine  . Drug Use: No  .  Sexual Activity:    Partners: Male    Birth Control/ Protection: OCP     Comment: Microgestin(just began)   Other Topics Concern  . Not on file   Social History Narrative   Lives with the youngest of her 3 daughters.   Her oldest daughter lives in [place].   Her middle daughter lives independently, and is due with her first child 12/01/15.   Her son lives with his father, who moved out 04/2015, nearby.  ROS:  Pertinent items are noted in HPI.  PHYSICAL EXAMINATION:    BP 122/78 mmHg  Pulse 100  Resp 25  Ht 5\' 3"  (1.6 m)  Wt 267 lb 11.2 oz (121.428 kg)  BMI 47.43 kg/m2  LMP 02/18/2016 (Exact Date)    General appearance: alert, cooperative and appears stated age.   As I was beginning the exam, the patient gave a history of chest pain - epigastric, radiating to her back, down her left arm, and into her jaw.   EMS called. Head: Normocephalic, without obvious abnormality, atraumatic   Lungs: clear to auscultation bilaterally   Heart: regular rate and rhythm Abdomen:  Obese, soft, non-tender, no masses,  no organomegaly Extremities: extremities normal, atraumatic, no cyanosis or edema Skin: Skin color, texture, turgor normal. No rashes or lesions No abnormal inguinal nodes palpated Neurologic: Grossly normal  Pelvic: External genitalia:  no lesions              Urethra:  normal appearing urethra with no masses, tenderness or lesions              Bartholins and Skenes: normal                 Vagina: normal appearing vagina with normal color and discharge, no lesions              Cervix: no lesions and no bleeding.    Bimanual Exam:  Uterus:  enlarged, 6 - 8  weeks size  BM exam limited by [facility].              Adnexa: normal adnexa and no mass, fullness, tenderness            Chaperone was present for exam.  ASSESSMENT  Menorrhagia with irregular menses.  Anemia.  Chest pain, shortness of breath, palpitations.   PLAN  [facility]. Will do pelvic  ultrasound after her anemia is evaluated.  Will eventually get mammogram and pap done as well.  I did discuss options for tx of vaginal bleeding prior to getting her history of chest pain.    An After Visit Summary was printed and given to the patient.  ___20___ minutes face to face time of which over 50% was spent in counseling.

## 2016-02-20 NOTE — ED Provider Notes (Signed)
CSN: [id number]     Arrival date & time 02/20/16  1241 History   First MD Initiated Contact with Patient 02/20/16 1247     Chief Complaint  Patient presents with  . Chest Pain   HPI  Pt presents from gyn appt via [facility] with c/o CP and vaginal bleeding x46mo. Pt has been seeing gynecologist for heavy bleeding, reports saturating 1 tampon & 1 pad per hour but in the last 3 days her period has stopped and she is no longer bleeding. Pt says LMP ended on 6/3. Last Hg level taken at gyn appt was 7, a week ago. Pt c/o new, twisting CP today, which starts in center chest, moves to L shoulder and neck. Patient has had extensive prior heart evaluation with a negative stress test negative heart cath in 2012. She has a history of diabetes, hypertension and obesity. She does not have any early family history of coronary artery disease. She states she has chest pain and shortness of breath chronically all thought states that the symptoms since her anemia started happen worse. She denies any history of PE or DVT, hemoptysis, fever, chills, leg swelling or pain.    Pt was given 324mg  ASA and 1 NTG tab by EMS, symptoms unrelieved. Patient subsequently stated that nitroglycerin may have helped slightly. Pt also reports being under increased stress at home, little sleep.  Pt denies any weight gain recently.    Past Medical History  Diagnosis Date  . Hyperlipidemia   . Asthma   . Arthritis   . Hypertension     per Dr. Irven Shelling note 08/30/2011  . Anxiety   . GERD (gastroesophageal reflux disease)   . Headache(784.0)   . 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 Diabetes mellitus without complication ([place])   . Neuromuscular disorder ([place])   . Fibroid   . Abnormal uterine bleeding   . Anemia   . Abnormal Pap smear of cervix 1990    Hx cryotherapy to cervix  . Thyroid disease     Hx 3 goiters--hx thyroidectomy--sees Dr. Forde Dandy   Past Surgical History  Procedure Laterality Date  . Cesarean section  1995  .  Cesarean section  1996  . Cardiac catheterization  2013  . Thyroidectomy N/A 12/18/2012    Procedure: TOTAL THYROIDECTOMY;  Surgeon: Earnstine Regal, MD;  [facility];  Service: General;  Laterality: N/A;  . Left heart catheterization with coronary angiogram  07/24/2011    Procedure: LEFT HEART CATHETERIZATION WITH CORONARY ANGIOGRAM;  Surgeon: Laverda Page, MD;  Location: [facility] CATH LAB;  Service: Cardiovascular;;   Family History  Problem Relation Age of Onset  . Heart disease Mother   . Stroke Mother   . Hypertension Mother   . Heart disease Father   . Colon cancer Father   . Hypertension Father   . Diabetes Father   . Breast cancer Paternal Aunt   . Cancer Paternal Grandmother   . Cancer Paternal Aunt   . Asthma Daughter   . Seizures Daughter   . Hypertension Sister   . Thyroid disease Sister   . Mental illness Daughter 25    suicide attempt 06/2013  . Asthma Sister   . Hypertension Sister    Social History  Substance Use Topics  . Smoking status: Never Smoker   . Smokeless tobacco: Never Used  . Alcohol Use: 0.0 oz/week    0 Standard drinks or equivalent per week     Comment: Occasional  glass of wine   OB History    Gravida Para Term Preterm AB TAB SAB Ectopic Multiple Living   3 3 3             Review of Systems  Constitutional: Negative for fever.  Respiratory: Positive for shortness of breath. Negative for cough.   Cardiovascular: Positive for chest pain (with coughing). Negative for leg swelling.  Genitourinary: Negative for vaginal bleeding.  Allergic/Immunologic: Negative for immunocompromised state.  All other systems reviewed and are negative.  Allergies  Latex; Dilaudid; and Hydrocodone  Home Medications   Prior to Admission medications   Medication Sig Start Date End Date Taking? Authorizing Provider  albuterol (PROVENTIL HFA;VENTOLIN HFA) 108 (90 BASE) MCG/ACT inhaler Inhale 2 puffs into the lungs daily as needed. Reported on 01/04/2016   Yes  Historical Provider, MD  ALPRAZolam Duanne Moron) 0.5 MG tablet TAKE 1 TABLET BY MOUTH AT BEDTIME AS NEEDED FOR SLEEP 01/02/16  Yes [name] Jeffery, PA-C  chlorthalidone (HYGROTON) 25 MG tablet Take 1 tablet (25 mg total) by mouth daily. 08/23/15  Yes [name] Jeffery, PA-C  diazepam (VALIUM) 5 MG tablet TAKE 1 TABLET BY MOUTH EVERY 6 HOURS AS NEEDED FOR MUSCLE SPASMS 08/23/15  Yes [name] Jeffery, PA-C  Ferrous Sulfate (IRON) 325 (65 Fe) MG TABS Take 1 tablet by mouth 2 (two) times daily.    Yes Historical Provider, MD  INVOKAMET 150-500 MG TABS TAKE 1 TABLET BY MOUTH ONCE DAILY 09/26/15  Yes [name] Jeffery, PA-C  levothyroxine (SYNTHROID, LEVOTHROID) 300 MCG tablet Take 1 tablet (300 mcg total) by mouth daily. 06/17/15  Yes [name] Jeffery, PA-C  lisinopril (PRINIVIL,ZESTRIL) 2.5 MG tablet Take 1 tablet by mouth daily. 02/19/16  Yes Historical Provider, MD  norethindrone-ethinyl estradiol-iron (MICROGESTIN FE,GILDESS FE,LOESTRIN FE) 1.5-30 MG-MCG tablet Take 1 tablet by mouth daily. 02/15/16  Yes [name] Jeffery, PA-C  nystatin (MYCOSTATIN) powder Apply topically 4 (four) times daily. 10/02/15  Yes [name] Jeffery, PA-C  pantoprazole (PROTONIX) 40 MG tablet TAKE 1 TABLET BY MOUTH TWICE DAILY 12/30/15  Yes [name] Jeffery, PA-C  sertraline (ZOLOFT) 100 MG tablet Take 1 tablet (100 mg total) by mouth daily. 03/11/15  Yes [name] Jeffery, PA-C  sucralfate (CARAFATE) 1 G tablet Take 1 tablet (1 g total) by mouth 4 (four) times daily. 04/19/15  Yes Linton Flemings, MD   BP 143/83 mmHg  Pulse 98  Temp(Src) 98.1 F (36.7 C) (Oral)  Resp 20  SpO2 100%  LMP 02/18/2016 (Exact Date) Physical Exam  Constitutional: She appears well-developed and well-nourished. No distress.  HENT:  Head: Normocephalic and atraumatic.  Eyes: Conjunctivae are normal. Right eye exhibits no discharge. Left eye exhibits no discharge.  Neck: Normal range of motion. Neck supple. No JVD present.  Cardiovascular: Normal rate, regular rhythm, normal heart  sounds and intact distal pulses.   No murmur heard. Pulmonary/Chest: No respiratory distress. She has no wheezes. She has no rales. She exhibits no tenderness.  On RA  Abdominal: Soft. Bowel sounds are normal. She exhibits no distension and no mass. There is no tenderness. There is no rebound and no guarding.  Musculoskeletal: She exhibits no edema (of LEs).  Neurological: She is alert.  Skin: Skin is warm. No rash noted. She is not diaphoretic.  Psychiatric: She has a normal mood and affect.  Nursing note and vitals reviewed.   ED Course  Procedures (including critical care time) Labs Review Labs Reviewed  CBC WITH DIFFERENTIAL/PLATELET - Abnormal; Notable for the following:    WBC 12.5 (*)  RBC 3.34 (*)    Hemoglobin 6.9 (*)    HCT 24.5 (*)    MCV 73.4 (*)    MCH 20.7 (*)    MCHC 28.2 (*)    RDW 20.7 (*)    Platelets 720 (*)    Neutro Abs 7.8 (*)    All other components within normal limits  COMPREHENSIVE METABOLIC PANEL - Abnormal; Notable for the following:    Glucose, Bld 137 (*)    Calcium 8.1 (*)    Albumin 3.3 (*)    All other components within normal limits  BRAIN NATRIURETIC PEPTIDE - Abnormal; Notable for the following:    B Natriuretic Peptide 125.6 (*)    All other components within normal limits  PROTIME-INR  TROPONIN I  TSH  I-STAT TROPOININ, ED  TYPE AND SCREEN  PREPARE RBC (CROSSMATCH)  ABO/RH    Imaging Review Dg Chest 2 View  02/20/2016  CLINICAL DATA:  Chest pain. EXAM: CHEST  2 VIEW COMPARISON:  None. FINDINGS: The heart size and mediastinal contours are within normal limits. Both lungs are clear. The visualized skeletal structures are unremarkable. IMPRESSION: No active cardiopulmonary disease. Electronically Signed   By: Dorise Bullion III M.D   On: 02/20/2016 13:48   I have personally reviewed and evaluated these images and lab results as part of my medical decision-making.   EKG Interpretation   Date/Time:  Monday February 20 2016 12:41:35  EDT Ventricular Rate:  96 PR Interval:  154 QRS Duration: 80 QT Interval:  345 QTC Calculation: 436 R Axis:   47 Text Interpretation:  Sinus rhythm Nonspecific T wave abnormality No  significant change since last tracing Confirmed by Ashok Cordia  MD, Lennette Bihari  ([id number]) on 02/20/2016 1:02:02 PM Also confirmed by Ashok Cordia  MD, Lennette Bihari  ([id number]), editor Gilford Rile, CCT, [place] ([id number])  on 02/20/2016 1:08:40 PM      MDM   Final diagnoses:  Iron deficiency anemia due to chronic blood loss   Short of breath and chest pain likely secondary to anemia. Troponin is negative. Patient has had extensive previous cardiac evaluation which has all been negative. EKG without evidence of acute ischemia. Does have a high HEAR score but in the setting of previous evaluation and chronic chest pain with a delta troponin that has been negative, low yield to further stress tests at this time. Discussed follow-up and on nonemergent basis by primary care provider. Doubt pneumonia as patient's chest x-ray is unremarkable for consolidation. Additionally, patient has no fevers or chills or sputum production. Doubt CHF exacerbation as BNP is only 125. TSH is therapeutic. Patient's hemoglobin was 6.9. Patient's hemodynamically stable and not tachycardic. The anemia in a slow decline and the patient is no longer bleeding. One unit of PRBCs administered and patient can follow up with gynecology for further nonemergent evaluation. Return for ongoing bleeding, syncope, severe and progressively worse chest pain and shortness of breath or other concerning symptoms. Patient verbalized understanding and agreement with plan. Pt encouraged to continue taking her double strength iron supplement, miralax prn. Patient discharged in good condition.    Karma Greaser, MD 02/20/16 [place], MD 02/29/16 [phone]

## 2016-02-20 NOTE — ED Notes (Signed)
Patient transported to X-ray 

## 2016-02-20 NOTE — ED Notes (Signed)
Patient Alert and oriented X4. Stable and ambulatory. Patient verbalized understanding of the discharge instructions.  Patient belongings were taken by the patient.  

## 2016-02-20 NOTE — ED Notes (Signed)
EDP at bedside  

## 2016-02-20 NOTE — ED Notes (Signed)
Pt presents from gyn appt via [facility] with c/o CP and vaginal bleeding x41mo. Pt has been seeing gynecologist for heavy bleeding, reports saturating 1 tampon & 1 pad per hour. Most recently, blood has been more clotted. Last Hg level taken at gyn appt was 7, a week ago. Pt c/o new, twisting CP today, which starts in center chest, moves to L shoulder and neck. Pt was given 324mg  ASA and 1 NTG tab by EMS, symptoms unrelieved. Pt says LMP ended on 6/3. Pt also reports being under increased stress at home, little sleep.

## 2016-02-20 NOTE — Discharge Instructions (Signed)
Blood Transfusion, Care After °Refer to this sheet in the next few weeks. These instructions provide you with information about caring for yourself after your procedure. Your health care provider may also give you more specific instructions. Your treatment has been planned according to current medical practices, but problems sometimes occur. Call your health care provider if you have any problems or questions after your procedure. °WHAT TO EXPECT AFTER THE PROCEDURE °After your procedure, it is common to have: °· Bruising and soreness at the IV site. °· Chills or fever. °· Headache. °HOME CARE INSTRUCTIONS °· Take medicines only as directed by your health care provider. Ask your health care provider if you can take an over-the-counter pain reliever in case you have a fever or headache a day or two after your transfusion. °· Return to your normal activities as directed by your health care provider. °SEEK MEDICAL CARE IF:  °· You develop redness or irritation at your IV site. °· You have persistent fever, chills, or headache. °· Your urine is darker than normal. °· Your urine turns pink, red, or brown.   °· The white part of your eye turns yellow (jaundice).   °· You feel weak after doing your normal activities.   °SEEK IMMEDIATE MEDICAL CARE IF:  °· You have trouble breathing. °· You have fever and chills along with: °¨ Anxiety. °¨ Chest or back pain. °¨ Flushed skin. °¨ Clammy skin. °¨ A rapid heartbeat. °¨ Nausea. °  °This information is not intended to replace advice given to you by your health care provider. Make sure you discuss any questions you have with your health care provider. °  °Document Released: 09/24/2014 Document Reviewed: 09/24/2014 °Elsevier Interactive Patient Education ©2016 Elsevier Inc. ° °

## 2016-02-21 ENCOUNTER — Telehealth: Payer: Self-pay | Admitting: Obstetrics and Gynecology

## 2016-02-21 ENCOUNTER — Other Ambulatory Visit: Payer: Self-pay | Admitting: Obstetrics and Gynecology

## 2016-02-21 DIAGNOSIS — Z1231 Encounter for screening mammogram for malignant neoplasm of breast: Secondary | ICD-10-CM

## 2016-02-21 LAB — TYPE AND SCREEN
ABO/RH(D): O POS
ANTIBODY SCREEN: NEGATIVE
Unit division: 0

## 2016-02-21 NOTE — Telephone Encounter (Signed)
Spoke with patient. Advised of message as seen below from [place]. She is agreeable and verbalizes understanding. She would like assistance scheduling screening mammogram at [facility] I will contact their imaging department and return call with appointment date and time. She is agreeable. Phone call passed to [facility] to discuss benefits.

## 2016-02-21 NOTE — Telephone Encounter (Signed)
Patient was seen in the office yesterday by Dr.Silva or evaluation of irregular bleeding and anemia. While in the office the patient began experiencing SOB and chest pain. Was sent for evaluation via the EMS. Patient was discharged home after evaluation with ED. Per Dr.Silva's note the patient is to follow up with PUS. Was given one unit PRBCs in the ED and advised to follow up with our office with nonemergent appointment for evaluation of anemia. Was advised by ED to also increase taking her iron supplement to 2 times per day (see EPIC note). Spoke with patient. Advised PUS is recommended for further evaluation. Patient declines PUS appointment for 6/8 at 9 am due to time. PUS scheduled for 03/01/2016 at 10:30 am with 11 am consult with Dr.Silva. Patient is agreeable to date and time. Reports she feels better today than yesterday and feels symptoms were coming from anemia. Reports mild chest pain remains. Denies any worsening or severe chest pain, SOB, or syncope. Aware if she develops any further bleeding, severe or worsening chest pain, SOB, or syncope she will need to be seen in the ER for evaluation. She is agreeable. Advised I will send a message to Dr.Silva and covering provider and return call with any further recommendations. Patient is aware Dr.Silva is out of the office today.  PLAN  [facility] now [facility]. Will do pelvic ultrasound after her anemia is evaluated.  Will eventually get mammogram and pap done as well.  I did discuss options for tx of vaginal bleeding prior to getting her history of chest pain.   An After Visit Summary was printed and given to the patient.  ___20___ minutes face to face time of which over 50% was spent in counseling.   Routing to [place] for review as Dr.Silva is out of the office today. Cc: Dr.Silva

## 2016-02-21 NOTE — Telephone Encounter (Signed)
Rx printed at 104. Will bring to 102 after clinic.  Meds ordered this encounter  Medications  . ALPRAZolam (XANAX) 0.5 MG tablet    Sig: TAKE 1 TABLET BY MOUTH EVERY NIGHT AT BEDTIME AS NEEDED FOR SLEEP    Dispense:  30 tablet    Refill:  0

## 2016-02-21 NOTE — Telephone Encounter (Signed)
[facility] is no longer performing mammograms. Call to the [facility]. [facility]29/2016 at 12:20 pm with 12 pm arrival. Spoke with patient who is agreeable to date and time. Address and telephone number to the [facility] provided to the patient. She verbalizes understanding.   Routing to provider for final review. Patient agreeable to disposition. Will close encounter.

## 2016-02-21 NOTE — Telephone Encounter (Signed)
Patient was seen yesterday and did not get to finish her appointment due to going to ER.  Patient is asking what is her next step? Does she need an ultrasound? Patient is aware that Dr.Silva is out of the office today and may not have an answer today.

## 2016-02-21 NOTE — Telephone Encounter (Signed)
I reviewed her hospital labs.  She did not have an MI.  She was transfused one unit packed RBCs for a hemoglobin 6.9. I recommend that she proceed forward with the ultrasound appointment.  I send through orders last evening for an ultrasound, possible sonohysterogram, and EMB. In the meantime, take iron sulfate 325 mg po tid with orange juice.  Please call the office if she develops vaginal bleeding before her ultrasound appointment.  Please facilitate in scheduling a mammogram for the patient.  I believe she is past due for this.

## 2016-02-22 NOTE — Telephone Encounter (Signed)
Faxed

## 2016-02-27 ENCOUNTER — Telehealth: Payer: Self-pay | Admitting: Obstetrics and Gynecology

## 2016-02-27 MED ORDER — MEDROXYPROGESTERONE ACETATE 10 MG PO TABS: ORAL_TABLET | ORAL | Status: DC

## 2016-02-27 NOTE — Telephone Encounter (Signed)
Call to patient. Notified of instructions from Dr Quincy Simmonds to keep appointment as scheduled and begin Provera 10 mg once daily for 10 days. Patient agreeable to plan.  Routing to provider for final review. Patient agreeable to disposition. Will close encounter.

## 2016-02-27 NOTE — Telephone Encounter (Signed)
Please keep ultrasound appointment as scheduled. You may close the encounter.

## 2016-02-27 NOTE — Telephone Encounter (Signed)
Please have patient start Provera 10 mg po q day for 10 days to try to reduce the bleeding.  Please send to pharmacy of choice.

## 2016-02-27 NOTE — Telephone Encounter (Signed)
Return call to patient, left message to call back. 

## 2016-02-27 NOTE — Telephone Encounter (Signed)
Return call from patient. States menses started on Friday evening. Concerned about pelvic ultrasound appointment.  Advised to keep appointment as scheduled. Will review with Dr Quincy Simmonds and call her back if any changes are recommended.  States chest pain and SOB have  Improved but not resolved since transfusion.   Agree with keeping appointment as scheduled?

## 2016-02-27 NOTE — Telephone Encounter (Signed)
Patient is scheduled for an ultrasound on Thursday and started bleeding today. She wants to know if she will need to reschedule this appointment.

## 2016-02-29 ENCOUNTER — Ambulatory Visit
Admission: RE | Admit: 2016-02-29 | Discharge: 2016-02-29 | Disposition: A | Discharge status: Home / Self Care | Payer: BLUE CROSS/BLUE SHIELD | Source: Ambulatory Visit | Attending: Obstetrics and Gynecology | Admitting: Obstetrics and Gynecology

## 2016-02-29 DIAGNOSIS — Z1231 Encounter for screening mammogram for malignant neoplasm of breast: Secondary | ICD-10-CM | POA: Diagnosis not present

## 2016-03-01 ENCOUNTER — Ambulatory Visit (INDEPENDENT_AMBULATORY_CARE_PROVIDER_SITE_OTHER): Payer: BLUE CROSS/BLUE SHIELD

## 2016-03-01 ENCOUNTER — Ambulatory Visit (INDEPENDENT_AMBULATORY_CARE_PROVIDER_SITE_OTHER): Payer: BLUE CROSS/BLUE SHIELD | Admitting: Obstetrics and Gynecology

## 2016-03-01 ENCOUNTER — Encounter: Payer: Self-pay | Admitting: Obstetrics and Gynecology

## 2016-03-01 ENCOUNTER — Other Ambulatory Visit: Payer: Self-pay | Admitting: Obstetrics and Gynecology

## 2016-03-01 VITALS — BP 122/74 | HR 90 | Ht 63.0 in | Wt 267.0 lb

## 2016-03-01 DIAGNOSIS — N920 Excessive and frequent menstruation with regular cycle: Secondary | ICD-10-CM | POA: Diagnosis not present

## 2016-03-01 DIAGNOSIS — D649 Anemia, unspecified: Secondary | ICD-10-CM | POA: Diagnosis not present

## 2016-03-01 DIAGNOSIS — N921 Excessive and frequent menstruation with irregular cycle: Secondary | ICD-10-CM

## 2016-03-01 DIAGNOSIS — N840 Polyp of corpus uteri: Secondary | ICD-10-CM | POA: Diagnosis not present

## 2016-03-01 DIAGNOSIS — D259 Leiomyoma of uterus, unspecified: Secondary | ICD-10-CM

## 2016-03-01 LAB — CBC
HEMATOCRIT: 29.6 % — AB (ref 35.0–45.0)
Hemoglobin: 8.6 g/dL — ABNORMAL LOW (ref 11.7–15.5)
MCH: 21.9 pg — AB (ref 27.0–33.0)
MCHC: 29.1 g/dL — ABNORMAL LOW (ref 32.0–36.0)
MCV: 75.5 fL — AB (ref 80.0–100.0)
MPV: 9.1 fL (ref 7.5–12.5)
Platelets: 628 10*3/uL — ABNORMAL HIGH (ref 140–400)
RBC: 3.92 MIL/uL (ref 3.80–5.10)
RDW: 20.2 % — AB (ref 11.0–15.0)
WBC: 10.4 10*3/uL (ref 3.8–10.8)

## 2016-03-01 LAB — POCT URINE PREGNANCY: Preg Test, Ur: NEGATIVE

## 2016-03-01 NOTE — Progress Notes (Signed)
GYNECOLOGY  VISIT   HPI: 50 y.o.   Single  African American  female   G3P3000 with Patient's last menstrual period was 02/18/2016 (exact date).   here for   sonohysterogram and EMB due to menorrhagia with irregular menses.   Patient seen for a new patient visit on 02/20/16.  She was sent from her PCP for anemia with a Hgb of 7.  She was just started on combined OCPs from her PCP. She does have HTN.   At her office visit, patient gave a history of persistent left chest pain radiating to left arm and jaw in addition to shortness of breath.  She was transported to [facility] by EMS services. She was transfused 1 unit of packed red blood cells for Hgb of 6.9.  She had a negative troponin.  She did stop her combined oral contraceptives on that day.   Patient is on Provera 10 mg daily currently.  Taking iron tid with OJ.  She has used Depo Provera in the past and had hair thinning and weight gain.   Declines future childbearing.   UPT today - negative.   GYNECOLOGIC HISTORY: Patient's last menstrual period was 02/18/2016 (exact date). Contraception: None  Menopausal hormone therapy: n/a Last mammogram:  02/29/16 - BI-RADS1. Last pap smear: ??10 years ago which she states was abnormal at [facility]--has history of cryotherapy to cervix in 1990.        OB History    Gravida Para Term Preterm AB TAB SAB Ectopic Multiple Living   3 3 3                 Patient Active Problem List   Diagnosis Date Noted  . Iron deficiency anemia 02/15/2016  . Myalgia 02/15/2016  . Upper airway cough syndrome 10/21/2015  . Polyarthralgia 06/16/2015  . Diabetes mellitus type 2, uncomplicated ([place]) XX123456  . Keloid scar, cervical incision 09/02/2013  . Reaction, situational, acute, to stress 07/02/2013  . 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 Severe obesity (BMI >= 40) ([place]) 03/27/2013  . GERD (gastroesophageal reflux disease) 02/15/2013  . Hypocalcemia 01/07/2013  . Chest pain  on exertion 07/24/2011    Past Medical History  Diagnosis Date  . Hyperlipidemia   . Asthma   . Arthritis   . Hypertension     per Dr. Irven Shelling note 08/30/2011  . Anxiety   . GERD (gastroesophageal reflux disease)   . Headache(784.0)   . 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 Diabetes mellitus without complication ([place])   . Neuromuscular disorder ([place])   . Fibroid   . Abnormal uterine bleeding   . Anemia   . Abnormal Pap smear of cervix 1990    Hx cryotherapy to cervix  . Thyroid disease     Hx 3 goiters--hx thyroidectomy--sees Dr. Forde Dandy    Past Surgical History  Procedure Laterality Date  . Cesarean section  1995  . Cesarean section  1996  . Cardiac catheterization  2013  . Thyroidectomy N/A 12/18/2012    Procedure: TOTAL THYROIDECTOMY;  Surgeon: Earnstine Regal, MD;  Location: [facility];  Service: General;  Laterality: N/A;  . Left heart catheterization with coronary angiogram  07/24/2011    Procedure: LEFT HEART CATHETERIZATION WITH CORONARY ANGIOGRAM;  Surgeon: Laverda Page, MD;  Location: [facility] CATH LAB;  Service: Cardiovascular;;    Current Outpatient Prescriptions  Medication Sig Dispense Refill  . albuterol (PROVENTIL HFA;VENTOLIN HFA) 108 (90 BASE) MCG/ACT inhaler Inhale 2 puffs into the  lungs daily as needed. Reported on 01/04/2016    . ALPRAZolam (XANAX) 0.5 MG tablet TAKE 1 TABLET BY MOUTH EVERY NIGHT AT BEDTIME AS NEEDED FOR SLEEP 30 tablet 0  . chlorthalidone (HYGROTON) 25 MG tablet Take 1 tablet (25 mg total) by mouth daily. 90 tablet 0  . diazepam (VALIUM) 5 MG tablet TAKE 1 TABLET BY MOUTH EVERY 6 HOURS AS NEEDED FOR MUSCLE SPASMS 30 tablet 0  . Ferrous Sulfate (IRON) 325 (65 Fe) MG TABS Take 1 tablet by mouth 2 (two) times daily.     . INVOKAMET 150-500 MG TABS TAKE 1 TABLET BY MOUTH ONCE DAILY 30 tablet 0  . levothyroxine (SYNTHROID, LEVOTHROID) 300 MCG tablet Take 1 tablet (300 mcg total) by mouth daily. 90 tablet 3  . lisinopril (PRINIVIL,ZESTRIL) 2.5 MG  tablet Take 1 tablet by mouth daily.  1  . medroxyPROGESTERone (PROVERA) 10 MG tablet Take one tablet daily for 10 days and then as instructed. 30 tablet 0  . norethindrone-ethinyl estradiol-iron (MICROGESTIN FE,GILDESS FE,LOESTRIN FE) 1.5-30 MG-MCG tablet Take 1 tablet by mouth daily. 1 Package 12  . nystatin (MYCOSTATIN) powder Apply topically 4 (four) times daily. 60 g 2  . pantoprazole (PROTONIX) 40 MG tablet TAKE 1 TABLET BY MOUTH TWICE DAILY 60 tablet 0  . sertraline (ZOLOFT) 100 MG tablet Take 1 tablet (100 mg total) by mouth daily. 90 tablet 1  . sucralfate (CARAFATE) 1 G tablet Take 1 tablet (1 g total) by mouth 4 (four) times daily. 30 tablet 0   No current facility-administered medications for this visit.     ALLERGIES: Latex; Dilaudid; and Hydrocodone  Family History  Problem Relation Age of Onset  . Heart disease Mother   . Stroke Mother   . Hypertension Mother   . Heart disease Father   . Colon cancer Father   . Hypertension Father   . Diabetes Father   . Breast cancer Paternal Aunt   . Cancer Paternal Grandmother   . Cancer Paternal Aunt   . Asthma Daughter   . Seizures Daughter   . Hypertension Sister   . Thyroid disease Sister   . Mental illness Daughter 52    suicide attempt 06/2013  . Asthma Sister   . Hypertension Sister     Social History   Social History  . Marital Status: Single    Spouse Name: n/a  . Number of Children: 4  . Years of Education: N/A   Occupational History  . DAY-CARE TEACHER   . receiving (returns)     [employer]   Social History Main Topics  . Smoking status: Never Smoker   . Smokeless tobacco: Never Used  . Alcohol Use: 0.0 oz/week    0 Standard drinks or equivalent per week     Comment: Occasional glass of wine  . Drug Use: No  . Sexual Activity:    Partners: Male    Birth Control/ Protection: OCP     Comment: Microgestin(just began)   Other Topics Concern  . Not on file   Social History Narrative   Lives with the  youngest of her 3 daughters.   Her oldest daughter lives in [place].   Her middle daughter lives independently, and is due with her first child 12/01/15.   Her son lives with his father, who moved out 04/2015, nearby.       ROS:  Pertinent items are noted in HPI.  PHYSICAL EXAMINATION:    BP 122/74 mmHg  Pulse 90  Ht  5\' 3"  (1.6 m)  Wt 267 lb (121.11 kg)  BMI 47.31 kg/m2  LMP 02/18/2016 (Exact Date)    General appearance: alert, cooperative and appears stated age  Technique:  Both transabdominal and transvaginal ultrasound examinations of the pelvis were performed. Transabdominal technique was performed for global imaging of the pelvis including uterus, ovaries, adnexal regions, and pelvic cul-de-sac. It was necessary to proceed with endovaginal exam following the abdominal ultrasound.  Transabdominal exam to visualize the endometrium and adnexa.  Color and duplex Doppler ultrasound was utilized to evaluate blood flow to the ovaries.   Ultrasound -  2 fibroids - 27 mm and 21 mm - IM and subserosal.  EMS - 5.89 mm. Ovaries normal.  No free fluid.  Procedure - sonohysterogram Consent performed. Speculum placed in vagina. Sterile prep of cervix with Hibiclens Cannula placed inside endometrial cavity without difficulty. Speculum removed. Sterile saline injected.     No         filling defect noted. Cannula removed. No complication.   Procedure - endometrial biopsy Consent performed. Speculum place in vagina.  Sterile prep of cervix with  Hibiclens. Tenaculum to anterior cervical lip. Pipelle placed to    11      cm without difficulty twice. Tissue obtained and sent to pathology. Speculum removed.  No complications. Minimal EBL.    ASSESSMENT  Menorrhagia with irregular menses. Fibroids.  Anemia.  Status post transfusion of 1 unit pRBCs.  HTN.   PLAN  Finish Provera.  Check CBC.  Discussed fibroids and probable anovulatory bleeding.  Options discussed -  Micronor. Mirena.  Uterine artery embolization and endometrial ablation - both not favored by me. Hysterectomy also discussed.  Handouts on Mirena and hysterectomy to patient.  Risks and benefits of each discussed. If chooses hysterectomy, would to laparoscopic approach with possible robotic assistance.   An After Visit Summary was printed and given to the patient.  __25____ minutes face to face time of which over 50% was spent in counseling.

## 2016-03-01 NOTE — Patient Instructions (Signed)

## 2016-03-02 ENCOUNTER — Encounter: Payer: Self-pay | Admitting: Obstetrics and Gynecology

## 2016-03-05 LAB — IPS OTHER TISSUE BIOPSY

## 2016-03-12 ENCOUNTER — Encounter: Payer: Self-pay | Admitting: Obstetrics and Gynecology

## 2016-03-12 ENCOUNTER — Ambulatory Visit (INDEPENDENT_AMBULATORY_CARE_PROVIDER_SITE_OTHER): Payer: BLUE CROSS/BLUE SHIELD | Admitting: Obstetrics and Gynecology

## 2016-03-12 VITALS — BP 140/76 | HR 96 | Resp 18 | Ht 63.5 in | Wt 262.0 lb

## 2016-03-12 DIAGNOSIS — Z1151 Encounter for screening for human papillomavirus (HPV): Secondary | ICD-10-CM | POA: Diagnosis not present

## 2016-03-12 DIAGNOSIS — N921 Excessive and frequent menstruation with irregular cycle: Secondary | ICD-10-CM

## 2016-03-12 DIAGNOSIS — N840 Polyp of corpus uteri: Secondary | ICD-10-CM | POA: Diagnosis not present

## 2016-03-12 DIAGNOSIS — Z124 Encounter for screening for malignant neoplasm of cervix: Secondary | ICD-10-CM | POA: Diagnosis not present

## 2016-03-12 DIAGNOSIS — D259 Leiomyoma of uterus, unspecified: Secondary | ICD-10-CM

## 2016-03-12 NOTE — Progress Notes (Signed)
Patient ID: Emily Mathis, female   DOB: 1965-12-06, 50 y.o.   MRN: [id number] GYNECOLOGY  VISIT   HPI: 50 y.o.   Single  African American  female   G3P3000 with Patient's last menstrual period was 03/12/2016.   here for   Path report/breast exam/pap.  Patient is here for follow up of menometrorrhagia and anemia.  Her hemoglobin fell to 6.9 and she had a transfusion of 1 unit packed RBCs. Last Hgb 8.6 on 03/01/16.   Pelvic ultrasound showed 2 fibroids - 21 and 27 mm and normal ovaries.   EMB showed benign endometrial polyp.   She was treated with a course of Provera during her work up of abnormal uterine bleeding.  Took her last pill 2 - 3 days ago. Bleeding started very light today.   She declines future childbearing.   She did stop her combined oral contraceptives after her first visit with me as she is hypertensive.  She was also having chest pain at that visit. She was sent to the ER directly from the office visit, and she had a negative troponin.  GYNECOLOGIC HISTORY: Patient's last menstrual period was 03/12/2016. Contraception:   None.  Menopausal hormone therapy:  n/a Last mammogram:  02-29-16 Density B/Neg/BiRads1:[facility] Last pap smear:  ?10 years ago and per patient normal--Hx of cryotherapy to cervix 1990        OB History    Gravida Para Term Preterm AB TAB SAB Ectopic Multiple Living   3 3 3                 Patient Active Problem List   Diagnosis Date Noted  . Iron deficiency anemia 02/15/2016  . Myalgia 02/15/2016  . Upper airway cough syndrome 10/21/2015  . Polyarthralgia 06/16/2015  . Diabetes mellitus type 2, uncomplicated ([place]) XX123456  . Keloid scar, cervical incision 09/02/2013  . Reaction, situational, acute, to stress 07/02/2013  . 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 Severe obesity (BMI >= 40) ([place]) 03/27/2013  . GERD (gastroesophageal reflux disease) 02/15/2013  . Hypocalcemia 01/07/2013  . Chest pain on exertion 07/24/2011     Past Medical History  Diagnosis Date  . Hyperlipidemia   . Asthma   . Arthritis   . Hypertension     per Dr. Irven Shelling note 08/30/2011  . Anxiety   . GERD (gastroesophageal reflux disease)   . Headache(784.0)   . 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 Diabetes mellitus without complication ([place])   . Neuromuscular disorder ([place])   . Fibroid   . Abnormal uterine bleeding   . Anemia 02/20/16    Transfusion of 1 unit pRBCs  . Abnormal Pap smear of cervix 1990    Hx cryotherapy to cervix  . Thyroid disease     Hx 3 goiters--hx thyroidectomy--sees Dr. Forde Dandy    Past Surgical History  Procedure Laterality Date  . Cesarean section  1995  . Cesarean section  1996  . Cardiac catheterization  2013  . Thyroidectomy N/A 12/18/2012    Procedure: TOTAL THYROIDECTOMY;  Surgeon: Earnstine Regal, MD;  Location: [facility];  Service: General;  Laterality: N/A;  . Left heart catheterization with coronary angiogram  07/24/2011    Procedure: LEFT HEART CATHETERIZATION WITH CORONARY ANGIOGRAM;  Surgeon: Laverda Page, MD;  Location: [facility] CATH LAB;  Service: Cardiovascular;;    Current Outpatient Prescriptions  Medication Sig Dispense Refill  . albuterol (PROVENTIL HFA;VENTOLIN HFA) 108 (90 BASE) MCG/ACT inhaler Inhale 2 puffs into the lungs  daily as needed. Reported on 01/04/2016    . ALPRAZolam (XANAX) 0.5 MG tablet TAKE 1 TABLET BY MOUTH EVERY NIGHT AT BEDTIME AS NEEDED FOR SLEEP 30 tablet 0  . chlorthalidone (HYGROTON) 25 MG tablet Take 1 tablet (25 mg total) by mouth daily. 90 tablet 0  . diazepam (VALIUM) 5 MG tablet TAKE 1 TABLET BY MOUTH EVERY 6 HOURS AS NEEDED FOR MUSCLE SPASMS 30 tablet 0  . Ferrous Sulfate (IRON) 325 (65 Fe) MG TABS Take 1 tablet by mouth 2 (two) times daily.     . INVOKAMET 150-500 MG TABS TAKE 1 TABLET BY MOUTH ONCE DAILY 30 tablet 0  . levothyroxine (SYNTHROID, LEVOTHROID) 300 MCG tablet Take 1 tablet (300 mcg total) by mouth daily. 90 tablet 3  . lisinopril  (PRINIVIL,ZESTRIL) 2.5 MG tablet Take 1 tablet by mouth daily.  1  . medroxyPROGESTERone (PROVERA) 10 MG tablet Take one tablet daily for 10 days and then as instructed. 30 tablet 0  . norethindrone-ethinyl estradiol-iron (MICROGESTIN FE,GILDESS FE,LOESTRIN FE) 1.5-30 MG-MCG tablet Take 1 tablet by mouth daily. 1 Package 12  . nystatin (MYCOSTATIN) powder Apply topically 4 (four) times daily. 60 g 2  . pantoprazole (PROTONIX) 40 MG tablet TAKE 1 TABLET BY MOUTH TWICE DAILY 60 tablet 0  . sertraline (ZOLOFT) 100 MG tablet Take 1 tablet (100 mg total) by mouth daily. 90 tablet 1  . sucralfate (CARAFATE) 1 G tablet Take 1 tablet (1 g total) by mouth 4 (four) times daily. 30 tablet 0   No current facility-administered medications for this visit.     ALLERGIES: Latex; Dilaudid; and Hydrocodone  Family History  Problem Relation Age of Onset  . Heart disease Mother   . Stroke Mother   . Hypertension Mother   . Heart disease Father   . Colon cancer Father   . Hypertension Father   . Diabetes Father   . Breast cancer Paternal Aunt   . Cancer Paternal Grandmother   . Cancer Paternal Aunt   . Asthma Daughter   . Seizures Daughter   . Hypertension Sister   . Thyroid disease Sister   . Mental illness Daughter 30    suicide attempt 06/2013  . Asthma Sister   . Hypertension Sister     Social History   Social History  . Marital Status: Single    Spouse Name: n/a  . Number of Children: 4  . Years of Education: N/A   Occupational History  . DAY-CARE TEACHER   . receiving (returns)     [employer]   Social History Main Topics  . Smoking status: Never Smoker   . Smokeless tobacco: Never Used  . Alcohol Use: 0.0 oz/week    0 Standard drinks or equivalent per week     Comment: Occasional glass of wine  . Drug Use: No  . Sexual Activity:    Partners: Male    Birth Control/ Protection: OCP     Comment: Microgestin(just began)   Other Topics Concern  . Not on file   Social History  Narrative   Lives with the youngest of her 3 daughters.   Her oldest daughter lives in [place].   Her middle daughter lives independently, and is due with her first child 12/01/15.   Her son lives with his father, who moved out 04/2015, nearby.       ROS:  Pertinent items are noted in HPI.  PHYSICAL EXAMINATION:    BP 140/76 mmHg  Pulse 96  Resp 18  Ht 5' 3.5" (1.613 m)  Wt 262 lb (118.842 kg)  BMI 45.68 kg/m2  LMP 03/12/2016    General appearance: alert, cooperative and appears stated age   Breasts: normal appearance, no masses or tenderness, Inspection negative, No nipple retraction or dimpling, No nipple discharge or bleeding, No axillary or supraclavicular adenopathy   Abdomen:  Obese, soft, non-tender, no masses,  no organomegaly    Pelvic: External genitalia:  no lesions              Urethra:  normal appearing urethra with no masses, tenderness or lesions              Bartholins and Skenes: normal                 Vagina: normal appearing vagina with normal color and discharge, no lesions              Cervix: no lesions              Pap taken: Yes.   Bimanual Exam:  Uterus:  normal size, contour, position, consistency, mobility, non-tender              Adnexa: no mass, fullness, tenderness                 Chaperone was present for exam.  ASSESSMENT  Menorrhagia with irregular cycles.  Status post course of Provera. EMB showing benign polyp. Small fibroids. Significant anemia.  Status post transfusion of one unit of paced RBCs. Breast exam and pap done today.  Obesity. Keloid former. HTN.  PLAN  Follow up pap and HR HPV.  We discussed uterine fibroids, endometrial polyp, and abnormal uterine bleeding.  Options for treatment include progesterone therapies - Micronor, Depo Provera, Nexplanon, and Mirena and surgical care of either hysteroscopy with polypectomy and dilation and curettage or hysterectomy.   Our plan moving forward will be hysteroscopic  polypectomy with dilation and curettage and placement of Mirena IUD. Will move toward precerting this.  Written materials given.  Hopefully we can place the Mirena IUD during surgery.  If not, this will be done in an outpatient setting.  Will need to return for preop visit.  Patient knows to expect a withdrawal bleed from stopping the Provera.   An After Visit Summary was printed and given to the patient.  __25____ minutes face to face time of which over 50% was spent in counseling. This was not a well woman visit.  A breast exam and pap were done to bring the patient up to date on her care as the focus of her visits have been evaluation of her bleeding.

## 2016-03-14 LAB — IPS PAP TEST WITH HPV

## 2016-03-15 ENCOUNTER — Telehealth: Payer: Self-pay | Admitting: *Deleted

## 2016-03-15 NOTE — Telephone Encounter (Signed)
Call to patient to discuss surgery date options. Patient states she has not date restrictions. Prefers not to be early morning due to transportation.  Advised will call back once scheduled.

## 2016-03-22 NOTE — Telephone Encounter (Signed)
Call back to patient. Advised surgery scheduled for 04-10-16 at 1100 at [facility] time is subject to change but aware she requests to be late am case. Surgery consult scheduled for 03-26-16. Will complete surgery instructions at that visit since patient is busy with visitor now.   Routing to provider for final review. Patient agreeable to disposition. Will close encounter.

## 2016-03-26 ENCOUNTER — Telehealth: Payer: Self-pay | Admitting: Emergency Medicine

## 2016-03-26 ENCOUNTER — Encounter: Payer: Self-pay | Admitting: Obstetrics and Gynecology

## 2016-03-26 ENCOUNTER — Ambulatory Visit (INDEPENDENT_AMBULATORY_CARE_PROVIDER_SITE_OTHER): Payer: BLUE CROSS/BLUE SHIELD | Admitting: Obstetrics and Gynecology

## 2016-03-26 VITALS — BP 130/78 | HR 70 | Resp 24 | Ht 63.5 in | Wt 263.8 lb

## 2016-03-26 DIAGNOSIS — N921 Excessive and frequent menstruation with irregular cycle: Secondary | ICD-10-CM | POA: Diagnosis not present

## 2016-03-26 DIAGNOSIS — N840 Polyp of corpus uteri: Secondary | ICD-10-CM | POA: Diagnosis not present

## 2016-03-26 DIAGNOSIS — D649 Anemia, unspecified: Secondary | ICD-10-CM | POA: Diagnosis not present

## 2016-03-26 LAB — HEMOGLOBIN, FINGERSTICK: Hemoglobin, fingerstick: 8.9 g/dL — ABNORMAL LOW (ref 12.0–16.0)

## 2016-03-26 NOTE — Patient Instructions (Addendum)
Your procedure is scheduled on:  Tuesday, April 10, 2016  Enter through the Main Entrance of [facility] at:  9:30 AM  Pick up the phone at the desk and dial [phone].  Call this number if you have problems the morning of surgery: [phone].  Remember: Do NOT eat food or drink after:  Midnight Monday, April 09, 2016  Take these medicines the morning of surgery with a SIP OF WATER:  Levothyroxine, Lisinopril, Pantoprazole, Zoloft, Sucralfate, Chlorthaladone  Bring Asthma Inhaler day of surgery  Do NOT wear jewelry (body piercing), metal hair clips/bobby pins, make-up, or nail polish. Do NOT wear lotions, powders, or perfumes.  You may wear deodorant. Do NOT shave for 48 hours prior to surgery. Do NOT bring valuables to the hospital. Contacts, dentures, or bridgework may not be worn into surgery.  Have a responsible adult drive you home and stay with you for 24 hours after your procedure

## 2016-03-26 NOTE — Telephone Encounter (Signed)
Requesting Mirena IUD for Mail order to be placed 04/10/16 at [place], per representative Caryl Pina, patient does not have pharmacy benefit for [place] Code [phone]. Mirena is covered under Medical Benefit.   Called [employer] and spoke with representative, Heather P. She states patient may not have coverage for Mirena under her plan at all but if she does, would need a formulary exception request. Requests that I speak with a representative at Customer Service, transferred to representative, Elta Guadel[place]. He states that no prior authorization is required but she has coverage under medical plan only, not for pharmacy benefits.

## 2016-03-26 NOTE — Progress Notes (Signed)
Patient ID: Emily Mathis, female   DOB: [birth year], 50 y.o.   MRN: [id number] GYNECOLOGY  VISIT   HPI: 50 y.o.   Single  African American  female   [id number] with Patient's last menstrual period was 02/18/2016 (exact date).  Had only one episode of light spotting with wipiing on 03-12-16--no bleeding since. here for surgical consult.    Would like a Mirena IUD. Asking about using Mirena and tampons.   Patient is here for follow up of menometrorrhagia and anemia and surgical planning.  Her hemoglobin fell to 6.9 and she had a transfusion of 1 unit packed RBCs. Last Hgb 8.6 on 03/01/16.  She is taking iron three times a day.  Does have some palpitations at times. Nausea.  Pelvic ultrasound showed 2 fibroids - 21 and 27 mm and normal ovaries.   EMB showed benign endometrial polyp.   She was treated with a course of Provera during her work up of abnormal uterine bleeding.  Last episode of bleeding was on 03/12/16.  She declines future childbearing.   She did stop her combined oral contraceptives after her first visit with me as she is hypertensive.  She was also having chest pain at that visit. She was sent to the ER directly from the office visit, and she had a negative troponin.  Hgb today 8.9.  GYNECOLOGIC HISTORY: Patient's last menstrual period was 02/18/2016 (exact date). Contraception:  None Menopausal hormone therapy:  n/a Last mammogram: 02-29-16 [facility]  Last pap smear:   03-12-16 Neg:Neg HR HPV        OB History    Gravida Para Term Preterm AB TAB SAB Ectopic Multiple Living   3 3 3                 Patient Active Problem List   Diagnosis Date Noted  . Iron deficiency anemia 02/15/2016  . Myalgia 02/15/2016  . Upper airway cough syndrome 10/21/2015  . Polyarthralgia 06/16/2015  . Diabetes mellitus type 2, uncomplicated ([place]) [id number]  . Keloid scar, cervical incision 09/02/2013  . Reaction, situational, acute, to stress  07/02/2013  . 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 Severe obesity (BMI >= 40) ([place]) 03/27/2013  . GERD (gastroesophageal reflux disease) 02/15/2013  . Hypocalcemia 01/07/2013  . Chest pain on exertion 07/24/2011    Past Medical History  Diagnosis Date  . Hyperlipidemia   . Asthma   . Arthritis   . Hypertension     per Dr. Irven Shelling note 08/30/2011  . Anxiety   . GERD (gastroesophageal reflux disease)   . Headache(784.0)   . 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 Diabetes mellitus without complication ([place])   . Neuromuscular disorder ([place])   . Fibroid   . Abnormal uterine bleeding   . Anemia 02/20/16    Transfusion of 1 unit pRBCs  . Abnormal Pap smear of cervix 1990    Hx cryotherapy to cervix  . Thyroid disease     Hx 3 goiters--hx thyroidectomy--sees Dr. Forde Dandy    Past Surgical History  Procedure Laterality Date  . Cesarean section  1995  . Cesarean section  1996  . Cardiac catheterization  2013  . Thyroidectomy N/A 12/18/2012    Procedure: TOTAL THYROIDECTOMY;  Surgeon: Earnstine Regal, MD;  Location: [facility];  Service: General;  Laterality: N/A;  . Left heart catheterization with coronary angiogram  07/24/2011    Procedure: LEFT HEART CATHETERIZATION WITH CORONARY ANGIOGRAM;  Surgeon: Laverda Page, MD;  Location:  [facility];  Service: Cardiovascular;;    Current Outpatient Prescriptions  Medication Sig Dispense Refill  . albuterol (PROVENTIL HFA;VENTOLIN HFA) 108 (90 BASE) MCG/ACT inhaler Inhale 2 puffs into the lungs daily as needed. Reported on 01/04/2016    . ALPRAZolam (XANAX) 0.5 MG tablet Take 0.5 mg by mouth at bedtime as needed for sleep.    . Canagliflozin-Metformin HCl (INVOKAMET) 150-500 MG TABS Take 1 tablet by mouth daily.    . chlorthalidone (HYGROTON) 25 MG tablet Take 1 tablet (25 mg total) by mouth daily. 90 tablet 0  . diazepam (VALIUM) 5 MG tablet Take 5 mg by mouth every 6 (six) hours as needed for muscle spasms.    . ferrous sulfate 325 (65  FE) MG tablet Take 325 mg by mouth 3 (three) times daily with meals.    Marland Kitchen levothyroxine (SYNTHROID, LEVOTHROID) 300 MCG tablet Take 1 tablet (300 mcg total) by mouth daily. 90 tablet 3  . lisinopril (PRINIVIL,ZESTRIL) 2.5 MG tablet Take 1 tablet by mouth daily.  1  . medroxyPROGESTERone (PROVERA) 10 MG tablet Take one tablet daily for 10 days and then as instructed. (Patient not taking: Reported on 03/19/2016) 30 tablet 0  . norethindrone-ethinyl estradiol-iron (MICROGESTIN FE,GILDESS FE,LOESTRIN FE) 1.5-30 MG-MCG tablet Take 1 tablet by mouth daily. (Patient not taking: Reported on 03/19/2016) 1 Package 12  . nystatin (MYCOSTATIN) powder Apply topically 4 (four) times daily. (Patient taking differently: Apply topically 4 (four) times daily as needed (For heat rash.). ) 60 g 2  . pantoprazole (PROTONIX) 40 MG tablet Take 40 mg by mouth 2 (two) times daily.    . sertraline (ZOLOFT) 100 MG tablet Take 1 tablet (100 mg total) by mouth daily. 90 tablet 1  . sucralfate (CARAFATE) 1 G tablet Take 1 tablet (1 g total) by mouth 4 (four) times daily. 30 tablet 0   No current facility-administered medications for this visit.     ALLERGIES: Latex; Dilaudid; and Hydrocodone  Family History  Problem Relation Age of Onset  . Heart disease Mother   . Stroke Mother   . Hypertension Mother   . Heart disease Father   . Colon cancer Father   . Hypertension Father   . Diabetes Father   . Breast cancer Paternal Aunt   . Cancer Paternal Grandmother   . Cancer Paternal Aunt   . Asthma Daughter   . Seizures Daughter   . Hypertension Sister   . Thyroid disease Sister   . Mental illness Daughter 31    suicide attempt 06/2013  . Asthma Sister   . Hypertension Sister     Social History   Social History  . Marital Status: Single    Spouse Name: n/a  . Number of Children: 4  . Years of Education: N/A   Occupational History  . DAY-CARE TEACHER   . receiving (returns)     [employer]   Social History Main  Topics  . Smoking status: Never Smoker   . Smokeless tobacco: Never Used  . Alcohol Use: 0.0 oz/week    0 Standard drinks or equivalent per week     Comment: Occasional glass of wine  . Drug Use: No  . Sexual Activity:    Partners: Male    Birth Control/ Protection: Abstinence     Comment: Abstinence   Other Topics Concern  . Not on file   Social History Narrative   Lives with the youngest of her 3 daughters.   Her oldest daughter lives in  [place].   Her middle daughter lives independently, and is due with her first child 12/01/15.   Her son lives with his father, who moved out 04/2015, nearby.       ROS:  Pertinent items are noted in HPI.  PHYSICAL EXAMINATION:    BP 130/78 mmHg  Pulse 70  Resp 24  Ht 5' 3.5" (1.613 m)  Wt 263 lb 12.8 oz (119.659 kg)  BMI 45.99 kg/m2  LMP 02/18/2016 (Exact Date)    General appearance: alert, cooperative and appears stated age Head: Normocephalic, without obvious abnormality, atraumatic Neck: no adenopathy, supple, symmetrical, trachea midline and thyroid absent.  Lungs: clear to auscultation bilaterally Heart: regular rate and rhythm Abdomen:  Pfannensteil,  soft, non-tender, no masses,  no organomegaly Extremities: extremities normal, atraumatic, no cyanosis or edema Skin: Skin color, texture, turgor normal. No rashes or lesions Lymph nodes: Cervical, supraclavicular, and axillary nodes normal. No abnormal inguinal nodes palpated Neurologic: Grossly normal  Pelvic: External genitalia:  no lesions              Urethra:  normal appearing urethra with no masses, tenderness or lesions              Bartholins and Skenes: normal                 Vagina: normal appearing vagina with normal color and discharge, no lesions              Cervix: no lesions         Bimanual Exam:  Uterus:  normal size, contour, position, consistency, mobility, non-tender              Adnexa: normal adnexa and no mass, fullness, tenderness          Chaperone was present for exam.  ASSESSMENT  Menorrhagia with irregular cycles. Status post course of Provera. EMB showing benign polyp. Small fibroids. Significant anemia.  Status post transfusion of one unit of paced RBCs. Breast exam and pap done today.  Obesity. Keloid former. HTN.   PLAN  Discussion of hysteroscopy with Myosure polypectomy, dilation and curettage.  Risks, benefits, and alternatives reviewed. Risks include but are not limited to bleeding, infection, damage to surrounding organs including uterine perforation requiring hospitalization and laparoscopy, pulmonary edema, reaction to anesthesia, DVT, PE, death, need for further treatment and surgery including repeat hysteroscopy, hysterectomy or medical therapy.   Surgical expectations and recovery discussed.  Patient wishes to proceed. Has preop at the hospital tomorrow.  Labs and EKG ordered.  Discussion of Mirena IUD risks, benefits, and alternatives. If possible will plan for placement of Mirena IUD at the time of surgery.  If not, will proceed with this as an outpatient.    An After Visit Summary was printed and given to the patient.  __25____ minutes face to face time of which over 50% was spent in counseling.

## 2016-03-27 ENCOUNTER — Encounter (HOSPITAL_COMMUNITY): Payer: Self-pay

## 2016-03-27 ENCOUNTER — Encounter (HOSPITAL_COMMUNITY)
Admission: RE | Admit: 2016-03-27 | Discharge: 2016-03-27 | Disposition: A | Discharge status: Home / Self Care | Payer: BLUE CROSS/BLUE SHIELD | Source: Ambulatory Visit | Attending: Obstetrics and Gynecology | Admitting: Obstetrics and Gynecology

## 2016-03-27 ENCOUNTER — Encounter: Payer: Self-pay | Admitting: Physician Assistant

## 2016-03-27 ENCOUNTER — Other Ambulatory Visit: Payer: Self-pay | Admitting: Physician Assistant

## 2016-03-27 DIAGNOSIS — Z01812 Encounter for preprocedural laboratory examination: Secondary | ICD-10-CM | POA: Diagnosis not present

## 2016-03-27 HISTORY — DX: Chest pain, unspecified: R07.9

## 2016-03-27 HISTORY — DX: Reserved for inherently not codable concepts without codable children: IMO0001

## 2016-03-27 HISTORY — DX: Hypothyroidism, unspecified: E03.9

## 2016-03-27 LAB — BASIC METABOLIC PANEL
ANION GAP: 7 (ref 5–15)
BUN: 17 mg/dL (ref 6–20)
CHLORIDE: 104 mmol/L (ref 101–111)
CO2: 26 mmol/L (ref 22–32)
Calcium: 8.6 mg/dL — ABNORMAL LOW (ref 8.9–10.3)
Creatinine, Ser: 0.83 mg/dL (ref 0.44–1.00)
GFR calc non Af Amer: >60 mL/min (ref >60)
Glucose, Bld: 188 mg/dL — ABNORMAL HIGH (ref 65–99)
Potassium: 3.8 mmol/L (ref 3.5–5.1)
Sodium: 137 mmol/L (ref 135–145)

## 2016-03-27 LAB — CBC
HEMATOCRIT: 31.7 % — AB (ref 36.0–46.0)
HEMOGLOBIN: 9.6 g/dL — AB (ref 12.0–15.0)
MCH: 22.5 pg — AB (ref 26.0–34.0)
MCHC: 30.3 g/dL (ref 30.0–36.0)
MCV: 74.4 fL — AB (ref 78.0–100.0)
Platelets: 614 10*3/uL — ABNORMAL HIGH (ref 150–400)
RBC: 4.26 MIL/uL (ref 3.87–5.11)
RDW: 19.2 % — ABNORMAL HIGH (ref 11.5–15.5)
WBC: 10.7 10*3/uL — ABNORMAL HIGH (ref 4.0–10.5)

## 2016-03-27 NOTE — Telephone Encounter (Signed)
Update provided to Dr Quincy Simmonds.   Call to patient. Advised will plan insertion of IUD at post op appointment.  Post op scheduled for 04-23-16. Instructed to take Motrin 800 mg one hour prior with food. Surgery instruction sheet reviewed with patient and printed copy will be mailed, see copy scanned to chart.  Routing to provider for final review. Patient agreeable to disposition. Will close encounter.

## 2016-03-28 MED ORDER — ALBUTEROL SULFATE HFA 108 (90 BASE) MCG/ACT IN AERS: 2.0000 | INHALATION_SPRAY | Freq: Every day | RESPIRATORY_TRACT | Status: DC | PRN

## 2016-03-29 NOTE — Telephone Encounter (Signed)
Meds ordered this encounter  Medications  . ALPRAZolam (XANAX) 0.5 MG tablet    Sig: TAKE 1 TABLET BY MOUTH EVERY NIGHT AT BEDTIME AS NEEDED FOR SLEEP    Dispense:  30 tablet    Refill:  0  . chlorthalidone (HYGROTON) 25 MG tablet    Sig: TAKE 1 TABLET(25 MG) BY MOUTH DAILY    Dispense:  90 tablet    Refill:  1  . diazepam (VALIUM) 5 MG tablet    Sig: TAKE 1 TABLET BY MOUTH EVERY 6 HOURS AS NEEDED FOR MUSCLE SPASMS    Dispense:  30 tablet    Refill:  0  . lisinopril (PRINIVIL,ZESTRIL) 2.5 MG tablet    Sig: TAKE 1 TABLET BY MOUTH EVERY DAY    Dispense:  90 tablet    Refill:  1  . pantoprazole (PROTONIX) 40 MG tablet    Sig: TAKE 1 TABLET BY MOUTH TWICE DAILY    Dispense:  180 tablet    Refill:  1  . sertraline (ZOLOFT) 100 MG tablet    Sig: TAKE 1 TABLET BY MOUTH EVERY DAY    Dispense:  30 tablet    Refill:  0

## 2016-03-29 NOTE — Telephone Encounter (Signed)
[name], I see that the pt emailed you about these meds being req'd. You have seen her recently, but don't see these meds discussed at last visit. Do you want to OK RFs?

## 2016-03-29 NOTE — Telephone Encounter (Signed)
Faxed

## 2016-04-02 ENCOUNTER — Telehealth: Payer: Self-pay | Admitting: Obstetrics and Gynecology

## 2016-04-02 ENCOUNTER — Other Ambulatory Visit: Payer: Self-pay | Admitting: Obstetrics and Gynecology

## 2016-04-02 NOTE — Telephone Encounter (Signed)
Return call to patient.  Scheduled with Dr. Quincy Simmonds for Dilatation and Curettage/ Hysteroscopy with myosure on 04/10/16.  She started her cycle today after having some cramps this weekend.  Changing her pad two times today, but reports it was not soaked and states "bleeding is not bad." Denies dizziness, weakness, or lightheaded feelings.   Wants to know if procedure can be continued, I advised yes, procedure day and time will not be changed.  She wants to know if anything else needs to be done before procedure or if she needs Provera again prior to procedure.   Advised patient to call back or seek immediate medical care if bleeding worsens or soaking through 1 pad/tampon per hour for two hours or if becomes symptomatic with sob, chest pain, fatigued, lightheaded, or weakness.  Patient verbalized understanding and I advised that I will send message to Dr. Quincy Simmonds to review and will return call with instructions. Patient is agreeable.    cc [facility] for return call

## 2016-04-02 NOTE — Telephone Encounter (Signed)
I am recommending Provera 10 mg po q day until surgery.  Dispense: 10, RF none.  I do not want her to become severely anemic again.

## 2016-04-02 NOTE — Telephone Encounter (Signed)
Patient just started her cycle and has surgery on 04/10/16. Patient is asking if she needs to take the "prescription to stop her cycle".

## 2016-04-03 MED ORDER — MEDROXYPROGESTERONE ACETATE 10 MG PO TABS: 10.0000 mg | ORAL_TABLET | Freq: Every day | ORAL | Status: DC

## 2016-04-03 NOTE — Telephone Encounter (Signed)
Medication refill request: Medroxyprogesterone 10 mg tab Last AEX: 02/20/16-NGY ref for abnormal/irregular bleeding Next AEX: Not scheduled at this time BS Last MMG (if hormonal medication request): 02/29/16 [id number] Refill authorized: 02/27/16 #30 0R. Please advise. Thank you.

## 2016-04-03 NOTE — Telephone Encounter (Signed)
Prescription sent in for patient and message from Dr. Quincy Simmonds given.  She continues to state "bleeding is not that bad."  Advised to start today and to call back with any concerns or emergency symptoms as discussed. Patient verbalized understanding and will call back prn. Routing to provider for final review. Patient agreeable to disposition. Will close encounter.

## 2016-04-09 ENCOUNTER — Encounter (HOSPITAL_COMMUNITY): Payer: Self-pay

## 2016-04-09 ENCOUNTER — Telehealth: Payer: Self-pay | Admitting: Obstetrics and Gynecology

## 2016-04-09 NOTE — Telephone Encounter (Signed)
Call to patient. States she has continued to have vaginal bleeding. Almost stopped earlier today but continues to have light amount of bleeding. Concerned this will cancel her surgery. Advised this will not cancel her surgery and she should plan to arrive at hospital as scheduled  Advised dr Quincy Simmonds will be updated and we will call her back if any additional instructions.  Routing to provider for final review. Patient agreeable to disposition. Will close encounter.

## 2016-04-09 NOTE — Telephone Encounter (Signed)
Patient is having surgery tomorrow morning and is still bleeding.  Patient  is wondering if her surgery is still scheduled.

## 2016-04-10 ENCOUNTER — Encounter (HOSPITAL_COMMUNITY)
Admission: RE | Disposition: A | Discharge status: Home / Self Care | Payer: Self-pay | Source: Ambulatory Visit | Attending: Obstetrics and Gynecology

## 2016-04-10 ENCOUNTER — Encounter (HOSPITAL_COMMUNITY): Payer: Self-pay

## 2016-04-10 ENCOUNTER — Ambulatory Visit (HOSPITAL_COMMUNITY)
Admission: RE | Admit: 2016-04-10 | Discharge: 2016-04-10 | Disposition: A | Discharge status: Home / Self Care | Payer: BLUE CROSS/BLUE SHIELD | Source: Ambulatory Visit | Attending: Obstetrics and Gynecology | Admitting: Obstetrics and Gynecology

## 2016-04-10 ENCOUNTER — Ambulatory Visit (HOSPITAL_COMMUNITY): Payer: BLUE CROSS/BLUE SHIELD | Admitting: Anesthesiology

## 2016-04-10 DIAGNOSIS — D649 Anemia, unspecified: Secondary | ICD-10-CM | POA: Diagnosis not present

## 2016-04-10 DIAGNOSIS — N921 Excessive and frequent menstruation with irregular cycle: Secondary | ICD-10-CM | POA: Diagnosis not present

## 2016-04-10 DIAGNOSIS — N840 Polyp of corpus uteri: Secondary | ICD-10-CM | POA: Diagnosis not present

## 2016-04-10 DIAGNOSIS — D259 Leiomyoma of uterus, unspecified: Secondary | ICD-10-CM | POA: Insufficient documentation

## 2016-04-10 DIAGNOSIS — N926 Irregular menstruation, unspecified: Secondary | ICD-10-CM | POA: Diagnosis not present

## 2016-04-10 DIAGNOSIS — N920 Excessive and frequent menstruation with regular cycle: Secondary | ICD-10-CM | POA: Insufficient documentation

## 2016-04-10 DIAGNOSIS — N939 Abnormal uterine and vaginal bleeding, unspecified: Secondary | ICD-10-CM | POA: Diagnosis not present

## 2016-04-10 HISTORY — PX: HYSTEROSCOPY WITH D & C: SHX1775

## 2016-04-10 LAB — GLUCOSE, CAPILLARY
GLUCOSE-CAPILLARY: 122 mg/dL — AB (ref 65–99)
GLUCOSE-CAPILLARY: 107 mg/dL — AB (ref 65–99)

## 2016-04-10 LAB — PREGNANCY, URINE: Preg Test, Ur: NEGATIVE

## 2016-04-10 SURGERY — DILATATION AND CURETTAGE /HYSTEROSCOPY
Anesthesia: General | Site: Vagina

## 2016-04-10 MED ORDER — PROMETHAZINE HCL 25 MG/ML IJ SOLN: 6.2500 mg | INTRAMUSCULAR | Status: DC | PRN

## 2016-04-10 MED ORDER — SCOPOLAMINE 1 MG/3DAYS TD PT72
1.0000 | MEDICATED_PATCH | Freq: Once | TRANSDERMAL | Status: DC
  Administered 2016-04-10: 1.5 mg via TRANSDERMAL

## 2016-04-10 MED ORDER — DEXAMETHASONE SODIUM PHOSPHATE 10 MG/ML IJ SOLN
INTRAMUSCULAR | Status: DC | PRN
  Administered 2016-04-10: 4 mg via INTRAVENOUS

## 2016-04-10 MED ORDER — ONDANSETRON HCL 4 MG/2ML IJ SOLN
INTRAMUSCULAR | Status: DC | PRN
  Administered 2016-04-10: 4 mg via INTRAVENOUS

## 2016-04-10 MED ORDER — FENTANYL CITRATE (PF) 100 MCG/2ML IJ SOLN
INTRAMUSCULAR | Status: DC | PRN
  Administered 2016-04-10 (×3): 50 ug via INTRAVENOUS
  Administered 2016-04-10 (×2): 25 ug via INTRAVENOUS

## 2016-04-10 MED ORDER — SODIUM CHLORIDE 0.9 % IR SOLN
Status: DC | PRN
  Administered 2016-04-10: 3000 mL

## 2016-04-10 MED ORDER — MIDAZOLAM HCL 2 MG/2ML IJ SOLN
INTRAMUSCULAR | Status: AC
  Filled 2016-04-10: qty 2

## 2016-04-10 MED ORDER — MIDAZOLAM HCL 2 MG/2ML IJ SOLN
INTRAMUSCULAR | Status: DC | PRN
  Administered 2016-04-10: 2 mg via INTRAVENOUS

## 2016-04-10 MED ORDER — LIDOCAINE HCL 1 % IJ SOLN
INTRAMUSCULAR | Status: DC | PRN
  Administered 2016-04-10: 10 mL

## 2016-04-10 MED ORDER — LIDOCAINE HCL (CARDIAC) 20 MG/ML IV SOLN
INTRAVENOUS | Status: AC
  Filled 2016-04-10: qty 5

## 2016-04-10 MED ORDER — LACTATED RINGERS IV SOLN
INTRAVENOUS | Status: DC
  Administered 2016-04-10: 10:00:00 via INTRAVENOUS

## 2016-04-10 MED ORDER — LIDOCAINE HCL 1 % IJ SOLN
INTRAMUSCULAR | Status: AC
  Filled 2016-04-10: qty 20

## 2016-04-10 MED ORDER — IBUPROFEN 800 MG PO TABS: 800.0000 mg | ORAL_TABLET | Freq: Three times a day (TID) | ORAL | 0 refills | Status: DC | PRN

## 2016-04-10 MED ORDER — PROPOFOL 10 MG/ML IV BOLUS
INTRAVENOUS | Status: DC | PRN
  Administered 2016-04-10: 200 mg via INTRAVENOUS

## 2016-04-10 MED ORDER — SCOPOLAMINE 1 MG/3DAYS TD PT72
MEDICATED_PATCH | TRANSDERMAL | Status: AC
  Administered 2016-04-10: 1.5 mg via TRANSDERMAL
  Filled 2016-04-10: qty 1

## 2016-04-10 MED ORDER — PHENYLEPHRINE HCL 10 MG/ML IJ SOLN
INTRAMUSCULAR | Status: DC | PRN
  Administered 2016-04-10: 80 ug via INTRAVENOUS
  Administered 2016-04-10: 40 ug via INTRAVENOUS
  Administered 2016-04-10: 80 ug via INTRAVENOUS

## 2016-04-10 MED ORDER — FENTANYL CITRATE (PF) 100 MCG/2ML IJ SOLN
INTRAMUSCULAR | Status: AC
  Filled 2016-04-10: qty 2

## 2016-04-10 MED ORDER — PHENYLEPHRINE 40 MCG/ML (10ML) SYRINGE FOR IV PUSH (FOR BLOOD PRESSURE SUPPORT)
PREFILLED_SYRINGE | INTRAVENOUS | Status: AC
  Filled 2016-04-10: qty 10

## 2016-04-10 MED ORDER — ALBUTEROL SULFATE (2.5 MG/3ML) 0.083% IN NEBU
2.5000 mg | INHALATION_SOLUTION | Freq: Once | RESPIRATORY_TRACT | Status: AC
  Administered 2016-04-10: 2.5 mg via RESPIRATORY_TRACT

## 2016-04-10 MED ORDER — ALBUTEROL SULFATE (2.5 MG/3ML) 0.083% IN NEBU
INHALATION_SOLUTION | RESPIRATORY_TRACT | Status: AC
  Administered 2016-04-10: 2.5 mg via RESPIRATORY_TRACT
  Filled 2016-04-10: qty 3

## 2016-04-10 MED ORDER — ALBUTEROL SULFATE HFA 108 (90 BASE) MCG/ACT IN AERS
INHALATION_SPRAY | RESPIRATORY_TRACT | Status: DC | PRN
  Administered 2016-04-10: 2 via RESPIRATORY_TRACT

## 2016-04-10 MED ORDER — KETOROLAC TROMETHAMINE 30 MG/ML IJ SOLN
INTRAMUSCULAR | Status: DC | PRN
  Administered 2016-04-10: 30 mg via INTRAVENOUS

## 2016-04-10 MED ORDER — DEXAMETHASONE SODIUM PHOSPHATE 4 MG/ML IJ SOLN
INTRAMUSCULAR | Status: AC
  Filled 2016-04-10: qty 1

## 2016-04-10 MED ORDER — LACTATED RINGERS IV SOLN
INTRAVENOUS | Status: DC
  Administered 2016-04-10 (×2): via INTRAVENOUS

## 2016-04-10 MED ORDER — LIDOCAINE-EPINEPHRINE (PF) 1 %-1:200000 IJ SOLN
INTRAMUSCULAR | Status: AC
Start: 2016-04-10 — End: 2016-04-10
  Filled 2016-04-10: qty 30

## 2016-04-10 MED ORDER — FENTANYL CITRATE (PF) 100 MCG/2ML IJ SOLN: 25.0000 ug | INTRAMUSCULAR | Status: DC | PRN

## 2016-04-10 MED ORDER — PROPOFOL 10 MG/ML IV BOLUS
INTRAVENOUS | Status: AC
  Filled 2016-04-10: qty 40

## 2016-04-10 MED ORDER — ONDANSETRON HCL 4 MG/2ML IJ SOLN
INTRAMUSCULAR | Status: AC
  Filled 2016-04-10: qty 2

## 2016-04-10 SURGICAL SUPPLY — 21 items
CANISTER SUCT 3000ML (MISCELLANEOUS) ×4 IMPLANT
CATH ROBINSON RED A/P 16FR (CATHETERS) ×4 IMPLANT
CLOTH BEACON ORANGE TIMEOUT ST (SAFETY) ×4 IMPLANT
CONTAINER PREFILL 10% NBF 60ML (FORM) ×8 IMPLANT
DEVICE MYOSURE LITE (MISCELLANEOUS) IMPLANT
DEVICE MYOSURE REACH (MISCELLANEOUS) IMPLANT
ELECT REM PT RETURN 9FT ADLT (ELECTROSURGICAL)
ELECTRODE REM PT RTRN 9FT ADLT (ELECTROSURGICAL) IMPLANT
FILTER ARTHROSCOPY CONVERTOR (FILTER) ×4 IMPLANT
GLOVE BIO SURGEON STRL SZ 6.5 (GLOVE) ×3 IMPLANT
GLOVE BIO SURGEONS STRL SZ 6.5 (GLOVE) ×1
GLOVE BIOGEL PI IND STRL 7.0 (GLOVE) ×4 IMPLANT
GLOVE BIOGEL PI INDICATOR 7.0 (GLOVE) ×4
GOWN STRL REUS W/TWL LRG LVL3 (GOWN DISPOSABLE) ×8 IMPLANT
PACK VAGINAL MINOR WOMEN LF (CUSTOM PROCEDURE TRAY) ×4 IMPLANT
PAD OB MATERNITY 4.3X12.25 (PERSONAL CARE ITEMS) ×4 IMPLANT
SEAL ROD LENS SCOPE MYOSURE (ABLATOR) ×4 IMPLANT
TOWEL OR 17X24 6PK STRL BLUE (TOWEL DISPOSABLE) ×8 IMPLANT
TUBING AQUILEX INFLOW (TUBING) ×4 IMPLANT
TUBING AQUILEX OUTFLOW (TUBING) ×4 IMPLANT
WATER STERILE IRR 1000ML POUR (IV SOLUTION) ×4 IMPLANT

## 2016-04-10 NOTE — Anesthesia Procedure Notes (Signed)
Procedure Name: LMA Insertion Date/Time: 04/10/2016 11:17 AM Performed by: Jonna Munro Pre-anesthesia Checklist: Patient identified, Emergency Drugs available and Timeout performed Patient Re-evaluated:Patient Re-evaluated prior to inductionOxygen Delivery Method: Circle system utilized Preoxygenation: Pre-oxygenation with 100% oxygen Intubation Type: IV induction LMA: LMA inserted LMA Size: 4.0 Number of attempts: 1 Placement Confirmation: positive ETCO2 and breath sounds checked- equal and bilateral Dental Injury: Teeth and Oropharynx as per pre-operative assessment

## 2016-04-10 NOTE — Brief Op Note (Signed)
04/10/2016  11:57 AM  PATIENT:  Emily Mathis  50 y.o. female  PRE-OPERATIVE DIAGNOSIS:   Menorrhagia with irregular menses, endometrial polyp, fibroids, anemia   POST-OPERATIVE DIAGNOSIS:  Menorrhagia with irregular menses, fibroids, anemia  PROCEDURE:  Procedure(s): HYSTEROSCOPY WITH fractional dilatation and curretage (N/A)  SURGEON:  Surgeon(s) and Role:    * Staley Lunz E Yisroel Ramming, MD - Primary  PHYSICIAN ASSISTANT: NA  ASSISTANTS: none   ANESTHESIA:   paracervical block and LMA  EBL:  Total I/O In: -  Out: 105 [Urine:100; Blood:5]  IVF:  1200 cc LR. Normal saline deficit:  800 cc.  BLOOD ADMINISTERED:none  DRAINS: none   LOCAL MEDICATIONS USED:  LIDOCAINE  and Amount: 10 ml  SPECIMEN:  Source of Specimen:  Endocervical and endometrial curettings sent to pathology separately.  DISPOSITION OF SPECIMEN:  PATHOLOGY  COUNTS:  YES  TOURNIQUET:  * No tourniquets in log *  DICTATION: .Note written in [place]: Discharge to home after PACU  PATIENT DISPOSITION:  PACU - hemodynamically stable.   Delay start of Pharmacological VTE agent (>24hrs) due to surgical blood loss or risk of bleeding: not applicable

## 2016-04-10 NOTE — Progress Notes (Signed)
Update to History and Physical.   No marked change in status since office preop visit.  Having some light bleeding today. Patient examined.  OK to proceed with surgery.

## 2016-04-10 NOTE — Anesthesia Postprocedure Evaluation (Signed)
Anesthesia Post Note  Patient: Emily Mathis  Procedure(s) Performed: Procedure(s) (LRB): HYSTEROSCOPY WITH fractional dilatation and curretage (N/A)  Patient location during evaluation: PACU Anesthesia Type: General Level of consciousness: awake Pain management: pain level controlled Vital Signs Assessment: post-procedure vital signs reviewed and stable Respiratory status: spontaneous breathing Cardiovascular status: stable Postop Assessment: no signs of nausea or vomiting Anesthetic complications: no     Last Vitals:  Vitals:   04/10/16 1330 04/10/16 1331  BP:  139/68  Pulse:  (!) 105  Resp: 16 12  Temp:      Last Pain:  Vitals:   04/10/16 0952  TempSrc: Oral  PainSc: 3    Pain Goal:                 Octavio Matheney JR,JOHN Torell Minder

## 2016-04-10 NOTE — H&P (Signed)
Office Visit   03/26/2016 [place], MD  Obstetrics and Gynecology   Menorrhagia with irregular cycle +2 more  Dx   Surgical Consult ; Referred by Harrison Mons, PA-C  Reason for Visit   Progress Notes     Patient ID: Emily Mathis, female   DOB: [birth year], 50 y.o.   MRN: [id number] GYNECOLOGY  VISIT   HPI: 50 y.o.   Single  African American  female   G3P3000 with Patient's last menstrual period was 02/18/2016 (exact date).  Had only one episode of light spotting with wipiing on 03-12-16--no bleeding since. here for surgical consult.    Would like a Mirena IUD. Asking about using Mirena and tampons.   Patient is here for follow up of menometrorrhagia and anemia and surgical planning.  Her hemoglobin fell to 6.9 and she had a transfusion of 1 unit packed RBCs. Last Hgb 8.6 on 03/01/16.  She is taking iron three times a day.  Does have some palpitations at times. Nausea.  Pelvic ultrasound showed 2 fibroids - 21 and 27 mm and normal ovaries.   EMB showed benign endometrial polyp.   She was treated with a course of Provera during her work up of abnormal uterine bleeding.  Last episode of bleeding was on 03/12/16.  She declines future childbearing.   She did stop her combined oral contraceptives after her first visit with me as she is hypertensive.  She was also having chest pain at that visit. She was sent to the ER directly from the office visit, and she had a negative troponin.  Hgb today 8.9.  GYNECOLOGIC HISTORY: Patient's last menstrual period was 02/18/2016 (exact date). Contraception:  None Menopausal hormone therapy:  n/a Last mammogram: 02-29-16 [facility]  Last pap smear:   03-12-16 Neg:Neg HR HPV        OB History    Gravida Para Term Preterm AB TAB SAB Ectopic Multiple Living   3 3 3                     Patient Active Problem List   Diagnosis Date Noted    . Iron deficiency anemia 02/15/2016  . Myalgia 02/15/2016  . Upper airway cough syndrome 10/21/2015  . Polyarthralgia 06/16/2015  . Diabetes mellitus type 2, uncomplicated ([name]) [id number]  . Keloid scar, cervical incision 09/02/2013  . Reaction, situational, acute, to stress 07/02/2013  . 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 Severe obesity (BMI >= 40) ([place]) 03/27/2013  . GERD (gastroesophageal reflux disease) 02/15/2013  . Hypocalcemia 01/07/2013  . Chest pain on exertion 07/24/2011         Past Medical History  Diagnosis Date  . Hyperlipidemia   . Asthma   . Arthritis   . Hypertension     per Dr. Irven Shelling note 08/30/2011  . Anxiety   . GERD (gastroesophageal reflux disease)   . Headache(784.0)   . 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 Diabetes mellitus without complication ([place])   . Neuromuscular disorder ([place])   . Fibroid   . Abnormal uterine bleeding   . Anemia 02/20/16    Transfusion of 1 unit pRBCs  . Abnormal Pap smear of cervix 1990    Hx cryotherapy to cervix  . Thyroid disease     Hx 3 goiters--hx thyroidectomy--sees Dr. Forde Dandy          Past Surgical History  Procedure Laterality Date  . Cesarean section  1995  . Cesarean section  1996  . Cardiac catheterization  2013  . Thyroidectomy N/A 12/18/2012    Procedure: TOTAL THYROIDECTOMY;  Surgeon: Earnstine Regal, MD;  Location: [facility] ORS;  Service: General;  Laterality: N/A;  . Left heart catheterization with coronary angiogram  07/24/2011    Procedure: LEFT HEART CATHETERIZATION WITH CORONARY ANGIOGRAM;  Surgeon: Laverda Page, MD;  Location: [facility] CATH LAB;  Service: Cardiovascular;;          Current Outpatient Prescriptions  Medication Sig Dispense Refill  . albuterol (PROVENTIL HFA;VENTOLIN HFA) 108 (90 BASE) MCG/ACT inhaler Inhale 2 puffs into the lungs daily as needed. Reported on 01/04/2016    . ALPRAZolam (XANAX) 0.5 MG tablet Take 0.5 mg by mouth at bedtime as  needed for sleep.    . Canagliflozin-Metformin HCl (INVOKAMET) 150-500 MG TABS Take 1 tablet by mouth daily.    . chlorthalidone (HYGROTON) 25 MG tablet Take 1 tablet (25 mg total) by mouth daily. 90 tablet 0  . diazepam (VALIUM) 5 MG tablet Take 5 mg by mouth every 6 (six) hours as needed for muscle spasms.    . ferrous sulfate 325 (65 FE) MG tablet Take 325 mg by mouth 3 (three) times daily with meals.    Marland Kitchen levothyroxine (SYNTHROID, LEVOTHROID) 300 MCG tablet Take 1 tablet (300 mcg total) by mouth daily. 90 tablet 3  . lisinopril (PRINIVIL,ZESTRIL) 2.5 MG tablet Take 1 tablet by mouth daily.  1  . medroxyPROGESTERone (PROVERA) 10 MG tablet Take one tablet daily for 10 days and then as instructed. (Patient not taking: Reported on 03/19/2016) 30 tablet 0  . norethindrone-ethinyl estradiol-iron (MICROGESTIN FE,GILDESS FE,LOESTRIN FE) 1.5-30 MG-MCG tablet Take 1 tablet by mouth daily. (Patient not taking: Reported on 03/19/2016) 1 Package 12  . nystatin (MYCOSTATIN) powder Apply topically 4 (four) times daily. (Patient taking differently: Apply topically 4 (four) times daily as needed (For heat rash.). ) 60 g 2  . pantoprazole (PROTONIX) 40 MG tablet Take 40 mg by mouth 2 (two) times daily.    . sertraline (ZOLOFT) 100 MG tablet Take 1 tablet (100 mg total) by mouth daily. 90 tablet 1  . sucralfate (CARAFATE) 1 G tablet Take 1 tablet (1 g total) by mouth 4 (four) times daily. 30 tablet 0   No current facility-administered medications for this visit.     ALLERGIES: Latex; Dilaudid; and Hydrocodone        Family History  Problem Relation Age of Onset  . Heart disease Mother   . Stroke Mother   . Hypertension Mother   . Heart disease Father   . Colon cancer Father   . Hypertension Father   . Diabetes Father   . Breast cancer Paternal Aunt   . Cancer Paternal Grandmother   . Cancer Paternal Aunt   . Asthma Daughter   . Seizures Daughter   . Hypertension Sister   .  Thyroid disease Sister   . Mental illness Daughter 55    suicide attempt 06/2013  . Asthma Sister   . Hypertension Sister     Social History        Social History  . Marital Status: Single    Spouse Name: n/a  . Number of Children: 4  . Years of Education: N/A        Occupational History  . DAY-CARE TEACHER   . receiving (returns)     [employer]         Social History Main Topics  .  Smoking status: Never Smoker   . Smokeless tobacco: Never Used  . Alcohol Use: 0.0 oz/week    0 Standard drinks or equivalent per week     Comment: Occasional glass of wine  . Drug Use: No  . Sexual Activity:     Partners: Male    Birth Control/ Protection: Abstinence     Comment: Abstinence       Other Topics Concern  . Not on file      Social History Narrative   Lives with the youngest of her 3 daughters.   Her oldest daughter lives in [place].   Her middle daughter lives independently, and is due with her first child 12/01/15.   Her son lives with his father, who moved out 04/2015, nearby.       ROS:  Pertinent items are noted in HPI.  PHYSICAL EXAMINATION:    BP 130/78 mmHg  Pulse 70  Resp 24  Ht 5' 3.5" (1.613 m)  Wt 263 lb 12.8 oz (119.659 kg)  BMI 45.99 kg/m2  LMP 02/18/2016 (Exact Date)    General appearance: alert, cooperative and appears stated age Head: Normocephalic, without obvious abnormality, atraumatic Neck: no adenopathy, supple, symmetrical, trachea midline and thyroid absent.  Lungs: clear to auscultation bilaterally Heart: regular rate and rhythm Abdomen:  Pfannensteil,  soft, non-tender, no masses,  no organomegaly Extremities: extremities normal, atraumatic, no cyanosis or edema Skin: Skin color, texture, turgor normal. No rashes or lesions Lymph nodes: Cervical, supraclavicular, and axillary nodes normal. No abnormal inguinal nodes palpated Neurologic: Grossly normal  Pelvic: External genitalia:  no lesions               Urethra:  normal appearing urethra with no masses, tenderness or lesions              Bartholins and Skenes: normal                 Vagina: normal appearing vagina with normal color and discharge, no lesions              Cervix: no lesions         Bimanual Exam:  Uterus:  normal size, contour, position, consistency, mobility, non-tender              Adnexa: normal adnexa and no mass, fullness, tenderness         Chaperone was present for exam.  ASSESSMENT  Menorrhagia with irregular cycles. Status post course of Provera. EMB showing benign polyp. Small fibroids. Significant anemia.  Status post transfusion of one unit of paced RBCs. Breast exam and pap done today.  Obesity. Keloid former. HTN.   PLAN  Discussion of hysteroscopy with Myosure polypectomy, dilation and curettage.  Risks, benefits, and alternatives reviewed. Risks include but are not limited to bleeding, infection, damage to surrounding organs including uterine perforation requiring hospitalization and laparoscopy, pulmonary edema, reaction to anesthesia, DVT, PE, death, need for further treatment and surgery including repeat hysteroscopy, hysterectomy or medical therapy.   Surgical expectations and recovery discussed.  Patient wishes to proceed. Has preop at the hospital tomorrow.  Labs and EKG ordered.  Discussion of Mirena IUD risks, benefits, and alternatives. If possible will plan for placement of Mirena IUD at the time of surgery.  If not, will proceed with this as an outpatient.    An After Visit Summary was printed and given to the patient.  __25____ minutes face to face time of which over 50% was spent in counseling.

## 2016-04-10 NOTE — Anesthesia Preprocedure Evaluation (Addendum)
Anesthesia Evaluation  Patient identified by MRN, date of birth, ID band Patient awake    Reviewed: Allergy & Precautions, NPO status , Patient's Chart, lab work & pertinent test results  Airway Mallampati: II  TM Distance: >3 FB Neck ROM: Full    Dental  (+) Teeth Intact, Dental Advisory Given   Pulmonary asthma ,    Pulmonary exam normal breath sounds clear to auscultation       Cardiovascular hypertension, Pt. on medications Normal cardiovascular exam Rhythm:Regular Rate:Normal  Cath 2012: IMPRESSION: 1. Normal coronary arteries and normal left ventricular systolic function. 2. Normal left ventricular end-diastolic pressure.   Neuro/Psych  Headaches, PSYCHIATRIC DISORDERS Anxiety    GI/Hepatic Neg liver ROS, GERD  Medicated,  Endo/Other  diabetesHypothyroidism Morbid obesity  Renal/GU negative Renal ROS     Musculoskeletal  (+) Arthritis ,   Abdominal   Peds  Hematology  (+) Blood dyscrasia, anemia ,   Anesthesia Other Findings Day of surgery medications reviewed with the patient.  Reproductive/Obstetrics AUB, fibroids                           Anesthesia Physical Anesthesia Plan  ASA: III  Anesthesia Plan: General   Post-op Pain Management:    Induction: Intravenous  Airway Management Planned: LMA  Additional Equipment:   Intra-op Plan:   Post-operative Plan: Extubation in OR  Informed Consent: I have reviewed the patients History and Physical, chart, labs and discussed the procedure including the risks, benefits and alternatives for the proposed anesthesia with the patient or authorized representative who has indicated his/her understanding and acceptance.   Dental advisory given  Plan Discussed with: CRNA  Anesthesia Plan Comments: (Risks/benefits of general anesthesia discussed with patient including risk of damage to teeth, lips, gum, and tongue, nausea/vomiting,  allergic reactions to medications, and the possibility of heart attack, stroke and death.  All patient questions answered.  Patient wishes to proceed.)       Anesthesia Quick Evaluation

## 2016-04-10 NOTE — Op Note (Signed)
OPERATIVE REPORT   PREOPERATIVE DIAGNOSES:   Menorrhagia with irregular menses, endometrial polyp, fibroids, anemia.  POSTOPERATIVE DIAGNOSES:  Menorrhagia with irregular menses, fibroids, anemia.  PROCEDURE:  Hysteroscopy with fractional dilation and curettage.  SURGEON:  Lenard Galloway, MD  ANESTHESIA:  LMA, paracervical block with 10 mL of 1% lidocaine.  IV FLUIDS:   1200 cc LR.  EBL:  5 cc.  URINE OUTPUT:  100 cc.  NORMAL SALINE DEFICIT:   800 cc.  COMPLICATIONS:  None.  INDICATIONS FOR THE PROCEDURE:     The patient is a 50 year old P3 African American female with menorrhagia and irregular menses, anemia, and a known endometrial polyp on endometrial biopsy.  The patient has two small extracavitary fibroids.  She is currently on Provera to control her bleeding.  A plan is now made to proceed with a hysteroscopy with dilation and curettage and removal of endometrial polyp after risks, benefits and alternatives were reviewed.  FINDINGS:  Exam under anesthesia revealed a small anteverted mobile uterus.  A lower uterine segment fibroid was palpable. The uterus was sounded to 7 cm. Hysteroscopy showed a very long cervix.  No endometrial polyps or fibroids were seen in the uterus.  Both tubal ostia regions appeared normal.  Endocervical and endometrial currettings were scant.   SPECIMENS:  Endocervical and endometrial curettings were sent to Pathology separately.   PROCEDURE IN DETAIL:  The patient was reidentified in the preoperative hold area.  She received TED hose and PAS stockings for DVT prophylaxis.  In the operating room, the patient was placed in the dorsal lithotomy position and then an LMA anesthetic was introduced.  The patient's lower abdomen, vagina and perineum were sterilely prepped with Betadine and the  patient's bladder was catheterized of urine.  She was sterilly draped  An exam under anesthesia was performed.  A speculum was  placed inside the vagina and a single-tooth tenaculum was placed on the anterior cervical lip.  A paracervical block was performed with a total of 10 mL of 1% lidocaine plain.  The uterus was sounded. The cervix was dilated to a #21 Pratt dilator.  The MyoSure hysteroscope was then inserted inside the uterine cavity under the continuous infusion of normal saline solution.  Findings are as noted above.  The MyoSure hysteroscope was removed.  A Kevorkian curette was used to curette the  endocervix, and the specimen was sent to pathology. The cervix was further  dilated to a #25 Pratt dilator.  A sharp and serrated curette were introduced into  the uterine cavity and the endometrium was curetted in all 4 quadrants.  A  minimal amount of endometrial curettings was obtained.  This specimen was sent to Pathology.  The single-tooth tenaculum which had been placed on the anterior cervical lip was removed.  Hemostasis was good, and all of the vaginal instruments were removed.  The patient was awakened and escorted to the recovery room in stable condition after she was cleansed with Betadine.  There were no complications to the procedure.  All needle, instrument and sponge counts were correct.  Lenard Galloway, MD

## 2016-04-10 NOTE — Transfer of Care (Signed)
Immediate Anesthesia Transfer of Care Note  Patient: Emily Mathis  Procedure(s) Performed: Procedure(s): HYSTEROSCOPY WITH fractional dilatation and curretage (N/A)  Patient Location: PACU  Anesthesia Type:General  Level of Consciousness: awake, alert  and oriented  Airway & Oxygen Therapy: Patient Spontanous Breathing and Patient connected to nasal cannula oxygen  Post-op Assessment: Report given to RN and Post -op Vital signs reviewed and stable  Post vital signs: Reviewed and stable  Last Vitals:  Vitals:   04/10/16 0952  BP: 130/74  Pulse: 93  Resp: 20  Temp: 36.2 C    Last Pain:  Vitals:   04/10/16 0952  TempSrc: Oral  PainSc: 3          Complications: No apparent anesthesia complications

## 2016-04-10 NOTE — Discharge Instructions (Signed)
Hysteroscopy, Care After Refer to this sheet in the next few weeks. These instructions provide you with information on caring for yourself after your procedure. Your health care provider may also give you more specific instructions. Your treatment has been planned according to current medical practices, but problems sometimes occur. Call your health care provider if you have any problems or questions after your procedure.  WHAT TO EXPECT AFTER THE PROCEDURE After your procedure, it is typical to have the following:  You may have some cramping. This normally lasts for a couple days.  You may have bleeding. This can vary from light spotting for a few days to menstrual-like bleeding for 3-7 days. HOME CARE INSTRUCTIONS  Rest for the first 1-2 days after the procedure.  Only take over-the-counter or prescription medicines as directed by your health care provider. Do not take aspirin. It can increase the chances of bleeding.  Take showers instead of baths for 2 weeks or as directed by your health care provider.  Do not drive for 24 hours or as directed.  Do not drink alcohol while taking pain medicine.  Do not use tampons, douche, or have sexual intercourse for 2 weeks or until your health care provider says it is okay.  Take your temperature twice a day for 4-5 days. Write it down each time.  Follow your health care provider's advice about diet, exercise, and lifting.  If you develop constipation, you may:  Take a mild laxative if your health care provider approves.  Add bran foods to your diet.  Drink enough fluids to keep your urine clear or pale yellow.  Try to have someone with you or available to you for the first 24-48 hours, especially if you were given a general anesthetic.  Follow up with your health care provider as directed. SEEK MEDICAL CARE IF:  You feel dizzy or lightheaded.  You feel sick to your stomach (nauseous).  You have abnormal vaginal discharge.  You  have a rash.  You have pain that is not controlled with medicine. SEEK IMMEDIATE MEDICAL CARE IF:  You have bleeding that is heavier than a normal menstrual period.  You have a fever.  You have increasing cramps or pain, not controlled with medicine.  You have new belly (abdominal) pain.  You pass out.  You have pain in the tops of your shoulders (shoulder strap areas).  You have shortness of breath.   This information is not intended to replace advice given to you by your health care provider. Make sure you discuss any questions you have with your health care provider.   Document Released: 06/24/2013 Document Reviewed: 06/24/2013 Elsevier Interactive Patient Education 2016 Elsevier Inc.  Dilation and Curettage or Vacuum Curettage, Care After Refer to this sheet in the next few weeks. These instructions provide you with information on caring for yourself after your procedure. Your health care provider may also give you more specific instructions. Your treatment has been planned according to current medical practices, but problems sometimes occur. Call your health care provider if you have any problems or questions after your procedure. WHAT TO EXPECT AFTER THE PROCEDURE After your procedure, it is typical to have light cramping and bleeding. This may last for 2 days to 2 weeks after the procedure. HOME CARE INSTRUCTIONS   Do not drive for 24 hours.  Wait 1 week before returning to strenuous activities.  Take your temperature 2 times a day for 4 days and write it down. Provide these temperatures to  your health care provider if you develop a fever.  Avoid long periods of standing.  Avoid heavy lifting, pushing, or pulling. Do not lift anything heavier than 10 pounds (4.5 kg).  Limit stair climbing to once or twice a day.  Take rest periods often.  You may resume your usual diet.  Drink enough fluids to keep your urine clear or pale yellow.  Your usual bowel function  should return. If you have constipation, you may:  Take a mild laxative with permission from your health care provider.  Add fruit and bran to your diet.  Drink more fluids.  Take showers instead of baths until your health care provider gives you permission to take baths.  Do not go swimming or use a hot tub until your health care provider approves.  Try to have someone with you or available to you the first 24-48 hours, especially if you were given a general anesthetic.  Do not douche, use tampons, or have sex (intercourse) for 2 weeks after the procedure.  Only take over-the-counter or prescription medicines as directed by your health care provider. Do not take aspirin. It can cause bleeding.  Follow up with your health care provider as directed. SEEK MEDICAL CARE IF:   You have increasing cramps or pain that is not relieved with medicine.  You have abdominal pain that does not seem to be related to the same area of earlier cramping and pain.  You have bad smelling vaginal discharge.  You have a rash.  You are having problems with any medicine. SEEK IMMEDIATE MEDICAL CARE IF:   You have bleeding that is heavier than a normal menstrual period.  You have a fever.  You have chest pain.  You have shortness of breath.  You feel dizzy or feel like fainting.  You pass out.  You have pain in your shoulder strap area.  You have heavy vaginal bleeding with or without blood clots. MAKE SURE YOU:   Understand these instructions.  Will watch your condition.  Will get help right away if you are not doing well or get worse.   This information is not intended to replace advice given to you by your health care provider. Make sure you discuss any questions you have with your health care provider.   Document Released: 08/31/2000 Document Revised: 09/08/2013 Document Reviewed: 04/02/2013 Elsevier Interactive Patient Education [employer].

## 2016-04-12 ENCOUNTER — Telehealth: Payer: Self-pay

## 2016-04-12 NOTE — Telephone Encounter (Signed)
Spoke with patient. Advised of message as seen below from [place]. Patient verbalizes understanding. Patient states that she would like to proceed with IUD insertion. States Gay Filler helped her schedule this appointment for 04/23/2016 with Dr.Silva. She will keep this appointment as scheduled.  Routing to provider for final review. Patient agreeable to disposition. Will close encounter.

## 2016-04-12 NOTE — Telephone Encounter (Signed)
-----   Message from [name], MD sent at 04/12/2016  4:06 AM EDT ----- Please report surgical pathology to patient: Benign polypoid endometrium, no atypia or malignancy.  Benign endocervical tissue, no atypia or malignancy.   She is a great candidate for the Mirena IUD if she wishes to do this still.

## 2016-04-16 ENCOUNTER — Encounter (HOSPITAL_COMMUNITY): Payer: Self-pay | Admitting: Obstetrics and Gynecology

## 2016-04-20 ENCOUNTER — Other Ambulatory Visit: Payer: Self-pay | Admitting: Physician Assistant

## 2016-04-20 ENCOUNTER — Telehealth: Payer: Self-pay

## 2016-04-20 DIAGNOSIS — Z30430 Encounter for insertion of intrauterine contraceptive device: Secondary | ICD-10-CM

## 2016-04-20 NOTE — Telephone Encounter (Signed)
Patient is scheduled for Mirena insertion on 04/23/2016 at 3 pm with Dr.Silva. Order placed to link to appointment.  Routing to provider for final review. Patient agreeable to disposition. Will close encounter.

## 2016-04-23 ENCOUNTER — Encounter: Payer: Self-pay | Admitting: Obstetrics and Gynecology

## 2016-04-23 ENCOUNTER — Ambulatory Visit (INDEPENDENT_AMBULATORY_CARE_PROVIDER_SITE_OTHER): Payer: BLUE CROSS/BLUE SHIELD | Admitting: Obstetrics and Gynecology

## 2016-04-23 VITALS — BP 118/68 | HR 88 | Ht 63.5 in | Wt 266.0 lb

## 2016-04-23 DIAGNOSIS — N882 Stricture and stenosis of cervix uteri: Secondary | ICD-10-CM

## 2016-04-23 DIAGNOSIS — D649 Anemia, unspecified: Secondary | ICD-10-CM | POA: Diagnosis not present

## 2016-04-23 DIAGNOSIS — Z30430 Encounter for insertion of intrauterine contraceptive device: Secondary | ICD-10-CM

## 2016-04-23 NOTE — Progress Notes (Signed)
GYNECOLOGY  VISIT   HPI: 50 y.o.   Single  African American  female   G3P3000 with Patient's last menstrual period was 04/03/2016 (exact date).   here for 10 days post HYSTEROSCOPY WITH fractional dilatation and curretage (N/A Vagina ).    Patient also to have Mirena IUD insertion today.  Final pathology showed benign polypoid endometrium, benign ECC.  Feels tired and bloated.  Menses 04/03/16 - 04/09/16.   Not sexually active.   Last intercourse was May or June 2017.   UPT today - negative.   HGB 9.7  GYNECOLOGIC HISTORY: Patient's last menstrual period was 04/03/2016 (exact date). Contraception:  None Menopausal hormone therapy:  n/a Last mammogram:  02-29-16 [facility]  Last pap smear:    03-12-16 Neg:Neg HR HPV        OB History    Gravida Para Term Preterm AB Living   3 3 3          SAB TAB Ectopic Multiple Live Births                     Patient Active Problem List   Diagnosis Date Noted  . Iron deficiency anemia 02/15/2016  . Myalgia 02/15/2016  . Upper airway cough syndrome 10/21/2015  . Polyarthralgia 06/16/2015  . Diabetes mellitus type 2, uncomplicated ([place]) [id number]  . Keloid scar, cervical incision 09/02/2013  . Reaction, situational, acute, to stress 07/02/2013  . 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 Severe obesity (BMI >= 40) ([place]) 03/27/2013  . GERD (gastroesophageal reflux disease) 02/15/2013  . Hypocalcemia 01/07/2013  . Chest pain on exertion 07/24/2011    Past Medical History:  Diagnosis Date  . Abnormal Pap smear of cervix 1990   Hx cryotherapy to cervix  . Abnormal uterine bleeding   . Anemia 02/20/16   Transfusion of 1 unit pRBCs  . Anxiety   . Arthritis   . Asthma   . 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 Chest pain    per pt due to anemia-   . Diabetes mellitus without complication ([place])   . Fibroid   . GERD (gastroesophageal reflux disease)   . Headache(784.0)   . Hyperlipidemia   .  Hypertension    per Dr. Irven Shelling note 08/30/2011  . Hypothyroidism   . Neuromuscular disorder ([place])   . Shortness of breath dyspnea    on exertion due to anemia  . Thyroid disease    Hx 3 goiters--hx thyroidectomy--sees Dr. Forde Dandy    Past Surgical History:  Procedure Laterality Date  . CARDIAC CATHETERIZATION  2013  . CESAREAN SECTION  1995  . CESAREAN SECTION  1996  . HYSTEROSCOPY W/D&C N/A 04/10/2016   Procedure: HYSTEROSCOPY WITH fractional dilatation and curretage;  Surgeon: [name], MD;  Location: [facility];  Service: Gynecology;  Laterality: N/A;  . LEFT HEART CATHETERIZATION WITH CORONARY ANGIOGRAM  07/24/2011   Procedure: LEFT HEART CATHETERIZATION WITH CORONARY ANGIOGRAM;  Surgeon: Laverda Page, MD;  Location: [facility] CATH LAB;  Service: Cardiovascular;;  . THYROIDECTOMY N/A 12/18/2012   Procedure: TOTAL THYROIDECTOMY;  Surgeon: Earnstine Regal, MD;  Location: [facility];  Service: General;  Laterality: N/A;    Current Outpatient Prescriptions  Medication Sig Dispense Refill  . albuterol (PROVENTIL HFA;VENTOLIN HFA) 108 (90 Base) MCG/ACT inhaler Inhale 2 puffs into the lungs daily as needed. Reported on 01/04/2016 18 g 1  . ALPRAZolam (XANAX) 0.5 MG tablet TAKE 1 TABLET BY MOUTH EVERY NIGHT AT  BEDTIME AS NEEDED FOR SLEEP 30 tablet 0  . Canagliflozin-Metformin HCl (INVOKAMET) 150-500 MG TABS Take 1 tablet by mouth daily.    . chlorthalidone (HYGROTON) 25 MG tablet TAKE 1 TABLET(25 MG) BY MOUTH DAILY 90 tablet 1  . diazepam (VALIUM) 5 MG tablet TAKE 1 TABLET BY MOUTH EVERY 6 HOURS AS NEEDED FOR MUSCLE SPASMS 30 tablet 0  . ferrous sulfate 325 (65 FE) MG tablet Take 325 mg by mouth 3 (three) times daily with meals.    Marland Kitchen ibuprofen (ADVIL,MOTRIN) 800 MG tablet Take 1 tablet (800 mg total) by mouth every 8 (eight) hours as needed. 30 tablet 0  . levothyroxine (SYNTHROID, LEVOTHROID) 300 MCG tablet Take 1 tablet (300 mcg total) by mouth daily. 90 tablet 3  . lisinopril  (PRINIVIL,ZESTRIL) 2.5 MG tablet TAKE 1 TABLET BY MOUTH EVERY DAY 90 tablet 1  . nystatin (MYCOSTATIN) powder Apply topically 4 (four) times daily. (Patient taking differently: Apply topically 4 (four) times daily as needed (For heat rash.). ) 60 g 2  . pantoprazole (PROTONIX) 40 MG tablet Take 40 mg by mouth 2 (two) times daily.    . pantoprazole (PROTONIX) 40 MG tablet TAKE 1 TABLET BY MOUTH TWICE DAILY 180 tablet 1  . sertraline (ZOLOFT) 100 MG tablet TAKE 1 TABLET BY MOUTH EVERY DAY 30 tablet 0  . sucralfate (CARAFATE) 1 G tablet Take 1 tablet (1 g total) by mouth 4 (four) times daily. 30 tablet 0   No current facility-administered medications for this visit.      ALLERGIES: Latex; Dilaudid [hydromorphone hcl]; and Hydrocodone  Family History  Problem Relation Age of Onset  . Heart disease Mother   . Stroke Mother   . Hypertension Mother   . Heart disease Father   . Colon cancer Father   . Hypertension Father   . Diabetes Father   . Cancer Paternal Grandmother   . Asthma Daughter   . Seizures Daughter   . Hypertension Sister   . Thyroid disease Sister   . Mental illness Daughter 74    suicide attempt 06/2013  . Asthma Sister   . Hypertension Sister   . Breast cancer Paternal Aunt   . Cancer Paternal Aunt     Social History   Social History  . Marital status: Single    Spouse name: n/a  . Number of children: 4  . Years of education: N/A   Occupational History  . [employer]  . receiving (returns)     [employer]   Social History Main Topics  . Smoking status: Never Smoker  . Smokeless tobacco: Never Used  . Alcohol use 0.0 oz/week     Comment: Occasional glass of wine  . Drug use: No  . Sexual activity: Yes    Partners: Male    Birth control/ protection: Abstinence     Comment: Abstinence   Other Topics Concern  . Not on file   Social History Narrative   Lives with the youngest of her 3 daughters.   Her oldest daughter lives in [place].    Her middle daughter lives independently, and is due with her first child 12/01/15.   Her son lives with his father, who moved out 04/2015, nearby.       ROS:  Pertinent items are noted in HPI.  PHYSICAL EXAMINATION:    BP 118/68 (BP Location: Right Arm, Patient Position: Sitting, Cuff Size: Large)   Pulse 88   Ht 5' 3.5" (1.613 m)  Wt 266 lb (120.7 kg)   LMP 04/03/2016 (Exact Date)   BMI 46.38 kg/m     General appearance: alert, cooperative and appears stated age  Pelvic: External genitalia:  no lesions              Urethra:  normal appearing urethra with no masses, tenderness or lesions              Bartholins and Skenes: normal                 Vagina: normal appearing vagina with normal color and discharge, no lesions              Cervix: no lesions                Bimanual Exam:  Uterus:  normal size, contour, position, consistency, mobility, non-tender              Adnexa: no mass, fullness, tenderness           Attempted IUD insertion. Consent for procedure. Speculum placed in vagina.  Sterile prep with Hibiclens. Tenaculum to anterior cervical lip.  Local 1% lidocaine, 10 cc, paracervical block.  Lot [id number], exp 11/15/16. Os finder used.  Attempt to sound uterus and dilate cervix.  Depth achieved is 5 cm.  Unable to dilate beyond what feels like the internal os.  Procedure aborted.   Chaperone was present for exam.  ASSESSMENT  Menorrhagia with irregular menses.  Anemia.  Hx C/S. Keloid former.  PLAN  Discussed options for care - Mirena IUD placement with ultrasound guidance, Depo Provera, Micronor, oral progesterone, hysterectomy.  Will have patient return this week for ultrasound guided Mirena IUD placement. She will pretreat with Motrin 800 mg prior to her procedure.     An After Visit Summary was printed and given to the patient.    __15_____ minutes face to face time of which over 50% was spent in counseling.

## 2016-04-23 NOTE — Telephone Encounter (Signed)
[name], you saw pt in May for DM and Invokamet was on med list, but I don't see that we have Rxd it before. Do you want to OK RF? Looks like you wanted pt to RTC in 2 mos, so I will pend with note to RTC for more.

## 2016-04-24 ENCOUNTER — Encounter: Payer: Self-pay | Admitting: Obstetrics and Gynecology

## 2016-04-24 ENCOUNTER — Other Ambulatory Visit: Payer: Self-pay | Admitting: Physician Assistant

## 2016-04-25 ENCOUNTER — Other Ambulatory Visit: Payer: Self-pay

## 2016-04-25 MED ORDER — CANAGLIFLOZIN-METFORMIN HCL 150-500 MG PO TABS: 1.0000 | ORAL_TABLET | Freq: Every day | ORAL | 0 refills | Status: DC

## 2016-04-26 ENCOUNTER — Other Ambulatory Visit: Payer: Self-pay

## 2016-04-26 ENCOUNTER — Other Ambulatory Visit: Payer: Self-pay | Admitting: Obstetrics and Gynecology

## 2016-04-26 ENCOUNTER — Ambulatory Visit (INDEPENDENT_AMBULATORY_CARE_PROVIDER_SITE_OTHER): Payer: BLUE CROSS/BLUE SHIELD

## 2016-04-26 ENCOUNTER — Ambulatory Visit (INDEPENDENT_AMBULATORY_CARE_PROVIDER_SITE_OTHER): Payer: BLUE CROSS/BLUE SHIELD | Admitting: Obstetrics and Gynecology

## 2016-04-26 VITALS — BP 108/70 | HR 80 | Resp 16 | Ht 63.5 in | Wt 261.0 lb

## 2016-04-26 DIAGNOSIS — N921 Excessive and frequent menstruation with irregular cycle: Secondary | ICD-10-CM

## 2016-04-26 DIAGNOSIS — D259 Leiomyoma of uterus, unspecified: Secondary | ICD-10-CM

## 2016-04-26 DIAGNOSIS — Z30430 Encounter for insertion of intrauterine contraceptive device: Secondary | ICD-10-CM

## 2016-04-26 LAB — HEMOGLOBIN, FINGERSTICK: HEMOGLOBIN, FINGERSTICK: 9.7 g/dL — AB (ref 12.0–16.0)

## 2016-04-26 NOTE — Telephone Encounter (Signed)
LMOM for pt with Sarah's message.

## 2016-04-26 NOTE — Telephone Encounter (Signed)
I believe the patient sees an Endo so my guess is it came from them - it is ok to refill for a month in order for patient to get in to [facility]

## 2016-04-26 NOTE — Progress Notes (Signed)
GYNECOLOGY  VISIT   HPI: 50 y.o.   Single  African American  female   G3P3000 with Patient's last menstrual period was 04/03/2016 (exact date).   here for   Ultrasound guided Mirena IUD placement.   Office attempt on 04/23/16 unsuccessful due to cervical stenosis.   Has known fibroid 21 mm and 27 mm on prior pelvic ultrasound.   Has anemia and is status post transfusion of 1 units of packed RBCs.   Is status post hysteroscopy with dilation and curettage on 04/10/16 with benign polypoid endometrium noted.  POC Hgb 9.7 on 04/23/16.   Has HTN.   Has keloids.   Tired of taking pills.  Wants to stop the bleeding and wants her energy back. Gained weight and lost hair with Depo Provera.  Has completed childbearing.   GYNECOLOGIC HISTORY: Patient's last menstrual period was 04/03/2016 (exact date).          OB History    Gravida Para Term Preterm AB Living   3 3 3          SAB TAB Ectopic Multiple Live Births                     Patient Active Problem List   Diagnosis Date Noted  . Iron deficiency anemia 02/15/2016  . Myalgia 02/15/2016  . Upper airway cough syndrome 10/21/2015  . Polyarthralgia 06/16/2015  . Diabetes mellitus type 2, uncomplicated ([place]) [id number]  . Keloid scar, cervical incision 09/02/2013  . Reaction, situational, acute, to stress 07/02/2013  . 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 Severe obesity (BMI >= 40) ([place]) 03/27/2013  . GERD (gastroesophageal reflux disease) 02/15/2013  . Hypocalcemia 01/07/2013  . Chest pain on exertion 07/24/2011    Past Medical History:  Diagnosis Date  . Abnormal Pap smear of cervix 1990   Hx cryotherapy to cervix  . Abnormal uterine bleeding   . Anemia 02/20/16   Transfusion of 1 unit pRBCs  . Anxiety   . Arthritis   . Asthma   . 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 Chest pain    per pt due to anemia-   . Diabetes mellitus without complication ([place])   . Fibroid   . GERD (gastroesophageal reflux disease)    . Headache(784.0)   . Hyperlipidemia   . Hypertension    per Dr. Irven Shelling note 08/30/2011  . Hypothyroidism   . Neuromuscular disorder ([place])   . Shortness of breath dyspnea    on exertion due to anemia  . Thyroid disease    Hx 3 goiters--hx thyroidectomy--sees Dr. Forde Dandy    Past Surgical History:  Procedure Laterality Date  . CARDIAC CATHETERIZATION  2013  . CESAREAN SECTION  1995  . CESAREAN SECTION  1996  . HYSTEROSCOPY W/D&C N/A 04/10/2016   Procedure: HYSTEROSCOPY WITH fractional dilatation and curretage;  Surgeon: [name], MD;  Location: [facility];  Service: Gynecology;  Laterality: N/A;  . LEFT HEART CATHETERIZATION WITH CORONARY ANGIOGRAM  07/24/2011   Procedure: LEFT HEART CATHETERIZATION WITH CORONARY ANGIOGRAM;  Surgeon: Laverda Page, MD;  Location: [facility] CATH LAB;  Service: Cardiovascular;;  . THYROIDECTOMY N/A 12/18/2012   Procedure: TOTAL THYROIDECTOMY;  Surgeon: Earnstine Regal, MD;  Location: [facility];  Service: General;  Laterality: N/A;    Current Outpatient Prescriptions  Medication Sig Dispense Refill  . albuterol (PROVENTIL HFA;VENTOLIN HFA) 108 (90 Base) MCG/ACT inhaler Inhale 2 puffs into the lungs daily as needed. Reported on 01/04/2016 18  g 1  . ALPRAZolam (XANAX) 0.5 MG tablet TAKE 1 TABLET BY MOUTH EVERY NIGHT AT BEDTIME AS NEEDED FOR SLEEP 30 tablet 0  . Canagliflozin-Metformin HCl (INVOKAMET) 150-500 MG TABS Take 1 tablet by mouth daily. 90 tablet 0  . chlorthalidone (HYGROTON) 25 MG tablet TAKE 1 TABLET(25 MG) BY MOUTH DAILY 90 tablet 1  . diazepam (VALIUM) 5 MG tablet TAKE 1 TABLET BY MOUTH EVERY 6 HOURS AS NEEDED FOR MUSCLE SPASMS 30 tablet 0  . ferrous sulfate 325 (65 FE) MG tablet Take 325 mg by mouth 3 (three) times daily with meals.    Marland Kitchen ibuprofen (ADVIL,MOTRIN) 800 MG tablet Take 1 tablet (800 mg total) by mouth every 8 (eight) hours as needed. 30 tablet 0  . INVOKAMET 150-500 MG TABS TAKE 1 TABLET EVERY DAY 30 tablet 0  . levothyroxine  (SYNTHROID, LEVOTHROID) 300 MCG tablet Take 1 tablet (300 mcg total) by mouth daily. 90 tablet 3  . lisinopril (PRINIVIL,ZESTRIL) 2.5 MG tablet TAKE 1 TABLET BY MOUTH EVERY DAY 90 tablet 1  . nystatin (MYCOSTATIN) powder Apply topically 4 (four) times daily. (Patient taking differently: Apply topically 4 (four) times daily as needed (For heat rash.). ) 60 g 2  . pantoprazole (PROTONIX) 40 MG tablet Take 40 mg by mouth 2 (two) times daily.    . pantoprazole (PROTONIX) 40 MG tablet TAKE 1 TABLET BY MOUTH TWICE DAILY 180 tablet 1  . sertraline (ZOLOFT) 100 MG tablet TAKE 1 TABLET BY MOUTH EVERY DAY 30 tablet 2  . sucralfate (CARAFATE) 1 G tablet Take 1 tablet (1 g total) by mouth 4 (four) times daily. 30 tablet 0   No current facility-administered medications for this visit.      ALLERGIES: Latex; Dilaudid [hydromorphone hcl]; and Hydrocodone  Family History  Problem Relation Age of Onset  . Heart disease Mother   . Stroke Mother   . Hypertension Mother   . Heart disease Father   . Colon cancer Father   . Hypertension Father   . Diabetes Father   . Cancer Paternal Grandmother   . Asthma Daughter   . Seizures Daughter   . Hypertension Sister   . Thyroid disease Sister   . Mental illness Daughter 42    suicide attempt 06/2013  . Asthma Sister   . Hypertension Sister   . Breast cancer Paternal Aunt   . Cancer Paternal Aunt     Social History   Social History  . Marital status: Single    Spouse name: n/a  . Number of children: 4  . Years of education: N/A   Occupational History  . [employer]  . receiving (returns)     [employer]   Social History Main Topics  . Smoking status: Never Smoker  . Smokeless tobacco: Never Used  . Alcohol use 0.0 oz/week     Comment: Occasional glass of wine  . Drug use: No  . Sexual activity: Yes    Partners: Male    Birth control/ protection: Abstinence     Comment: Abstinence   Other Topics Concern  . Not on file    Social History Narrative   Lives with the youngest of her 3 daughters.   Her oldest daughter lives in [place].   Her middle daughter lives independently, and is due with her first child 12/01/15.   Her son lives with his father, who moved out 04/2015, nearby.       ROS:  Pertinent items are noted in  HPI.  PHYSICAL EXAMINATION:    LMP 04/03/2016 (Exact Date)     General appearance: alert, cooperative and appears stated age   Attempted US guided Mirena IUD placement. Pelvic ultrasound performed.  Speculum placed in vagina.  Sterilization of cervix with Hibiclens.  Tenaculum to anterior cervical lip.  Paracervical block with 10 cc 1% lidocaine, lot [id number], exp 11/15/16. Os finder used to find the cervical canal.  Under ultrasound guidance, uterine sound attempted to pass, unsuccessful with placing the tenaculum on the posterior lip as well.  Cervical dilator also used to attempt to dilate the cervix with ultrasound guidance, also unsuccessful. Procedure aborted.  ASSESSMENT  Menorrhagia with irregular menses. Hx Cesarean Section.  Cervical stenosis.  Unsuccessful Mirena IUD placement with ultrasound guidance.  Hx recent hysteroscopic polypectomy, benign. 2 fibroids - 2.13 and 2.69 cm by prior ultrasound. Keloid former. Anemia. Hgb is improving.    PLAN  Discussion about alternative options to treat her menorrhagia with irregular menses.  Medical therapy includes Micronor, Aygestin, Depo Provera injections. Surgical therapy includes uterine artery embolization, endometrial ablation, and robotic total laparoscopic hysterectomy.  I have provided written information to the patient about hysterectomy today. Will contact her in follow up to see how she would like to proceed forward.  An After Visit Summary was printed and given to the patient.  ___25___ minutes face to face time of which over 50% was spent in counseling.

## 2016-04-29 ENCOUNTER — Encounter: Payer: Self-pay | Admitting: Obstetrics and Gynecology

## 2016-05-03 ENCOUNTER — Telehealth: Payer: Self-pay | Admitting: *Deleted

## 2016-05-03 NOTE — Telephone Encounter (Signed)
Follow-up call to patient. States she has decided to proceed with surgery.  Wants to know how soon she should schedule hysterectomy? Advised this is likely related to preventing her blood count from dropping due to heavy bleeding with cycles. States LMP 04-03-16 thru 04-09-16. Now just spotting and cramping with pelvic pressure.  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 Discussed lifting limitations for 4-6 weeks after surgery. Suggested she check on alternative option for first few weeks as baby will only continue to grow between now and surgery. Will review recommendations on scheduling with Dr Quincy Simmonds and call her back.

## 2016-05-04 ENCOUNTER — Ambulatory Visit (INDEPENDENT_AMBULATORY_CARE_PROVIDER_SITE_OTHER): Payer: BLUE CROSS/BLUE SHIELD | Admitting: Physician Assistant

## 2016-05-04 VITALS — BP 140/82 | HR 116 | Temp 98.4°F | Resp 18 | Ht 63.5 in | Wt 263.0 lb

## 2016-05-04 DIAGNOSIS — M2550 Pain in unspecified joint: Secondary | ICD-10-CM | POA: Diagnosis not present

## 2016-05-04 DIAGNOSIS — D509 Iron deficiency anemia, unspecified: Secondary | ICD-10-CM | POA: Diagnosis not present

## 2016-05-04 DIAGNOSIS — R5382 Chronic fatigue, unspecified: Secondary | ICD-10-CM

## 2016-05-04 DIAGNOSIS — E119 Type 2 diabetes mellitus without complications: Secondary | ICD-10-CM

## 2016-05-04 DIAGNOSIS — Z23 Encounter for immunization: Secondary | ICD-10-CM | POA: Diagnosis not present

## 2016-05-04 DIAGNOSIS — M791 Myalgia, unspecified site: Secondary | ICD-10-CM

## 2016-05-04 NOTE — Progress Notes (Signed)
Patient ID: Emily Mathis, female    DOB: 17-Jul-1966, 50 y.o.   MRN: [id number]  PCP: Porfirio Oar, PA-C  Subjective:   Chief Complaint  Patient presents with  . Follow-up    overall health    HPI Presents for evaluation of Chronic medical problems. She is accompanied by her daughter, [name].  She recently saw GYN for placement of Mirena IUD in attempt to eliminate iron deficiency anemia, thought due to heavy menses. Unfortunately, the attempt to place was unsuccessful, and now plans are in the works for hysterectomy.  Her anxiety and depression are controlled.  Pain and fatigue are the primary problems now, and she hasn't worked since the spring. [name] is helping with ADLs and all her other needs. As a result, she gets to spend more time with her new grandson, which helps to boost her mood.  She has disability forms that need completion today.    Review of Systems  Constitutional: Positive for activity change and fatigue. Negative for chills, diaphoresis and fever.  Eyes: Negative for visual disturbance.  Respiratory: Negative for cough, choking, shortness of breath and wheezing.   Cardiovascular: Negative for chest pain, palpitations and leg swelling.  Gastrointestinal: Negative for abdominal pain, constipation, diarrhea, nausea and vomiting.  Endocrine: Negative for cold intolerance, heat intolerance, polydipsia, polyphagia and polyuria.  Genitourinary: Positive for menstrual problem. Negative for dysuria, frequency and urgency.  Musculoskeletal: Positive for arthralgias and myalgias.  Neurological: Positive for weakness. Negative for numbness and headaches.  Psychiatric/Behavioral: Positive for dysphoric mood. Negative for self-injury, sleep disturbance and suicidal ideas. The patient is not nervous/anxious.        Patient Active Problem List   Diagnosis Date Noted  . Iron deficiency anemia 02/15/2016  . Myalgia 02/15/2016  . Upper airway cough syndrome  10/21/2015  . Polyarthralgia 06/16/2015  . Diabetes mellitus type 2, uncomplicated (HCC) 09/14/2013  . Keloid scar, cervical incision 09/02/2013  . Reaction, situational, acute, to stress 07/02/2013  . 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 Severe obesity (BMI >= 40) (HCC) 03/27/2013  . GERD (gastroesophageal reflux disease) 02/15/2013  . Hypocalcemia 01/07/2013  . Chest pain on exertion 07/24/2011     Prior to Admission medications   Medication Sig Start Date End Date Taking? Authorizing Provider  albuterol (PROVENTIL HFA;VENTOLIN HFA) 108 (90 Base) MCG/ACT inhaler Inhale 2 puffs into the lungs daily as needed. Reported on 01/04/2016 03/28/16  Yes Romyn Boswell, PA-C  ALPRAZolam (XANAX) 0.5 MG tablet TAKE 1 TABLET BY MOUTH EVERY NIGHT AT BEDTIME AS NEEDED FOR SLEEP 03/29/16  Yes Brandalyn Harting, PA-C  Canagliflozin-Metformin HCl (INVOKAMET) 150-500 MG TABS Take 1 tablet by mouth daily. 04/25/16  Yes Jayvon Mounger, PA-C  chlorthalidone (HYGROTON) 25 MG tablet TAKE 1 TABLET(25 MG) BY MOUTH DAILY 03/29/16  Yes Lilliona Blakeney, PA-C  diazepam (VALIUM) 5 MG tablet TAKE 1 TABLET BY MOUTH EVERY 6 HOURS AS NEEDED FOR MUSCLE SPASMS 03/29/16  Yes Gypsy Kellogg, PA-C  ferrous sulfate 325 (65 FE) MG tablet Take 325 mg by mouth 3 (three) times daily with meals.   Yes Historical Provider, MD  ibuprofen (ADVIL,MOTRIN) 800 MG tablet Take 1 tablet (800 mg total) by mouth every 8 (eight) hours as needed. 04/10/16  Yes Brook Rosalin Hawking, MD  INVOKAMET 150-500 MG TABS TAKE 1 TABLET EVERY DAY 04/26/16  Yes Morrell Riddle, PA-C  levothyroxine (SYNTHROID, LEVOTHROID) 300 MCG tablet Take 1 tablet (300 mcg total) by mouth daily. 06/17/15  Yes Soila Printup, PA-C  lisinopril (  PRINIVIL,ZESTRIL) 2.5 MG tablet TAKE 1 TABLET BY MOUTH EVERY DAY 03/29/16  Yes Haneef Hallquist, PA-C  nystatin (MYCOSTATIN) powder Apply topically 4 (four) times daily. Patient taking differently: Apply topically 4 (four) times daily as needed  (For heat rash.).  10/02/15  Yes Ark Agrusa, PA-C  pantoprazole (PROTONIX) 40 MG tablet Take 40 mg by mouth 2 (two) times daily.   Yes Historical Provider, MD  sertraline (ZOLOFT) 100 MG tablet TAKE 1 TABLET BY MOUTH EVERY DAY 04/24/16  Yes Jeanluc Wegman, PA-C  sucralfate (CARAFATE) 1 G tablet Take 1 tablet (1 g total) by mouth 4 (four) times daily. 04/19/15  Yes Marisa Severin, MD  pantoprazole (PROTONIX) 40 MG tablet TAKE 1 TABLET BY MOUTH TWICE DAILY Patient not taking: Reported on 05/04/2016 03/29/16   Porfirio Oar, PA-C     Allergies  Allergen Reactions  . Latex Itching and Other (See Comments)    leaves marks on skin  . Dilaudid [Hydromorphone Hcl] Itching  . Hydrocodone Itching       Objective:  Physical Exam  Constitutional: She is oriented to person, place, and time. She appears well-developed and well-nourished. She is active and cooperative. No distress.  BP 140/82 (BP Location: Right Arm, Patient Position: Sitting, Cuff Size: Large)   Pulse (!) 116   Temp 98.4 F (36.9 C) (Oral)   Resp 18   Ht 5' 3.5" (1.613 m)   Wt 263 lb (119.3 kg)   LMP 04/03/2016   SpO2 97%   BMI 45.86 kg/m   HENT:  Head: Normocephalic and atraumatic.  Right Ear: Hearing normal.  Left Ear: Hearing normal.  Eyes: Conjunctivae are normal. No scleral icterus.  Neck: Normal range of motion. Neck supple. No thyromegaly present.  Cardiovascular: Normal rate, regular rhythm and normal heart sounds.   Pulses:      Radial pulses are 2+ on the right side, and 2+ on the left side.  Pulmonary/Chest: Effort normal and breath sounds normal.  Lymphadenopathy:       Head (right side): No tonsillar, no preauricular, no posterior auricular and no occipital adenopathy present.       Head (left side): No tonsillar, no preauricular, no posterior auricular and no occipital adenopathy present.    She has no cervical adenopathy.       Right: No supraclavicular adenopathy present.       Left: No supraclavicular  adenopathy present.  Neurological: She is alert and oriented to person, place, and time. No sensory deficit.  Skin: Skin is warm, dry and intact. No rash noted. No cyanosis or erythema. Nails show no clubbing.  Psychiatric: She has a normal mood and affect. Her speech is normal and behavior is normal.       Diabetic Foot Exam - Simple   Simple Foot Form Diabetic Foot exam was performed with the following findings:  Yes 05/04/2016  3:11 PM  Visual Inspection No deformities, no ulcerations, no other skin breakdown bilaterally:  Yes Sensation Testing Intact to touch and monofilament testing bilaterally:  Yes Pulse Check Posterior Tibialis and Dorsalis pulse intact bilaterally:  Yes Comments        Assessment & Plan:   1. Type 2 diabetes mellitus without complication, without long-term current use of insulin [facility]) She sees endocrinology next week.  2. Morbid obesity, unspecified obesity type (HCC) Exercise is limited due to pain and fatigue.  3. Iron deficiency anemia Due to heavy menstruation. Proceed with hysterectomy as planned.  4. Chronic fatigue 5. Polyarthralgia 6. Myalgia  Forms completed.  7. Need for influenza vaccination - Flu Vaccine QUAD 36+ mos IM   Return in about 4 months (around 09/03/2016) for re-evaluation. Will plan to follow her less frequently, pending follow-up plan with endocrinology.  Fernande Bras, PA-C Physician Assistant-Certified [facility]

## 2016-05-04 NOTE — Patient Instructions (Signed)
     IF you received an x-ray today, you will receive an invoice from [facility]. Please contact [facility] at [phone] with questions or concerns regarding your invoice.   IF you received labwork today, you will receive an invoice from [facility]. Please contact [facility] at [phone] with questions or concerns regarding your invoice.   Our billing staff will not be able to assist you with questions regarding bills from these companies.  You will be contacted with the lab results as soon as they are available. The fastest way to get your results is to activate your My Chart account. Instructions are located on the last page of this paperwork. If you have not heard from us regarding the results in 2 weeks, please contact this office.      

## 2016-05-04 NOTE — Telephone Encounter (Signed)
Will plan for robotic total laparoscopic hysterectomy with bilateral salpingectomy, cystoscopy.  Continue Fe 325 mg po tid.   Will send for precert of surgery.   Cc- Lerry Liner

## 2016-05-17 NOTE — Telephone Encounter (Signed)
Surgery scheduled for 06-11-16 at 0730 at [facility]. Call to patient, left message to call back.

## 2016-05-22 NOTE — Telephone Encounter (Signed)
Returned call to patient. Given surgery date as seen below from [place]. Patient verbalized understanding. Advised patient when Gay Filler is back in the office on Thursday, she will call to go over pre op instructions. Patient agreeable.   Routing to [place].

## 2016-05-22 NOTE — Telephone Encounter (Signed)
Patient returning call. Ok to leave a detailed message if she does not answer. °

## 2016-05-22 NOTE — Telephone Encounter (Signed)
Patient is calling to speak with Gay Filler. She is aware that Gay Filler is out of the office until Thursday but would like to speak with nurse also.

## 2016-05-24 NOTE — Telephone Encounter (Signed)
Call to patient. Confirmed surgery date of 06-11-16 at 0730 at [facility]. Surgery instruction sheet reviewed with patient and printed copy mailed, see copy scanned to chart.  Routing to provider for final review. Patient agreeable to disposition. Will close encounter.

## 2016-05-28 DIAGNOSIS — K219 Gastro-esophageal reflux disease without esophagitis: Secondary | ICD-10-CM | POA: Diagnosis not present

## 2016-05-28 DIAGNOSIS — G894 Chronic pain syndrome: Secondary | ICD-10-CM | POA: Diagnosis not present

## 2016-05-28 DIAGNOSIS — E890 Postprocedural hypothyroidism: Secondary | ICD-10-CM | POA: Diagnosis not present

## 2016-05-28 DIAGNOSIS — E119 Type 2 diabetes mellitus without complications: Secondary | ICD-10-CM | POA: Diagnosis not present

## 2016-05-28 DIAGNOSIS — E559 Vitamin D deficiency, unspecified: Secondary | ICD-10-CM | POA: Diagnosis not present

## 2016-05-29 NOTE — Patient Instructions (Addendum)
Your procedure is scheduled on:  Monday, Sept. 25, 2017  Enter through the Main Entrance of [facility] at:  6:00 AM  Pick up the phone at the desk and dial [phone].  Call this number if you have problems the morning of surgery: [phone].  Remember: Do NOT eat food or drink after:  Midnight Sunday, Sept. 24, 2017  Take these medicines the morning of surgery with a SIP OF WATER:  Chlorthalidone, Levothyroxine, Lisinopril, Sertraline, Sucralfate  Do NOT wear jewelry (body piercing), metal hair clips/bobby pins, make-up, or nail polish. Do NOT wear lotions, powders, or perfumes.  You may wear deodorant. Do NOT shave for 48 hours prior to surgery. Do NOT bring valuables to the hospital. Contacts, dentures, or bridgework may not be worn into surgery.  Leave suitcase in car.  After surgery it may be brought to your room.  For patients admitted to the hospital, checkout time is 11:00 AM the day of discharge.

## 2016-05-30 ENCOUNTER — Encounter (HOSPITAL_COMMUNITY)
Admission: RE | Admit: 2016-05-30 | Discharge: 2016-05-30 | Disposition: A | Discharge status: Home / Self Care | Payer: BLUE CROSS/BLUE SHIELD | Source: Ambulatory Visit | Attending: Obstetrics and Gynecology | Admitting: Obstetrics and Gynecology

## 2016-05-30 ENCOUNTER — Encounter (HOSPITAL_COMMUNITY): Payer: Self-pay

## 2016-05-30 DIAGNOSIS — Z01812 Encounter for preprocedural laboratory examination: Secondary | ICD-10-CM | POA: Insufficient documentation

## 2016-05-30 HISTORY — DX: Other specified soft tissue disorders: M79.89

## 2016-05-30 HISTORY — DX: Personal history of other medical treatment: Z92.89

## 2016-05-30 HISTORY — DX: Pain, unspecified: R52

## 2016-05-30 LAB — COMPREHENSIVE METABOLIC PANEL
ALT: 20 U/L (ref 14–54)
AST: 22 U/L (ref 15–41)
Albumin: 3.6 g/dL (ref 3.5–5.0)
Alkaline Phosphatase: 73 U/L (ref 38–126)
Anion gap: 8 (ref 5–15)
BILIRUBIN TOTAL: 0.4 mg/dL (ref 0.3–1.2)
BUN: 21 mg/dL — AB (ref 6–20)
CHLORIDE: 102 mmol/L (ref 101–111)
CO2: 28 mmol/L (ref 22–32)
CREATININE: 0.93 mg/dL (ref 0.44–1.00)
Calcium: 8.5 mg/dL — ABNORMAL LOW (ref 8.9–10.3)
Glucose, Bld: 147 mg/dL — ABNORMAL HIGH (ref 65–99)
POTASSIUM: 3.3 mmol/L — AB (ref 3.5–5.1)
Sodium: 138 mmol/L (ref 135–145)
TOTAL PROTEIN: 8.3 g/dL — AB (ref 6.5–8.1)

## 2016-05-30 LAB — CBC
HEMATOCRIT: 31.9 % — AB (ref 36.0–46.0)
Hemoglobin: 9.6 g/dL — ABNORMAL LOW (ref 12.0–15.0)
MCH: 22.6 pg — AB (ref 26.0–34.0)
MCHC: 30.1 g/dL (ref 30.0–36.0)
MCV: 75.2 fL — AB (ref 78.0–100.0)
PLATELETS: 545 10*3/uL — AB (ref 150–400)
RBC: 4.24 MIL/uL (ref 3.87–5.11)
RDW: 19.2 % — AB (ref 11.5–15.5)
WBC: 8.3 10*3/uL (ref 4.0–10.5)

## 2016-05-30 LAB — TYPE AND SCREEN
ABO/RH(D): O POS
ANTIBODY SCREEN: NEGATIVE

## 2016-05-30 LAB — ABO/RH: ABO/RH(D): O POS

## 2016-06-01 ENCOUNTER — Telehealth: Payer: Self-pay

## 2016-06-01 ENCOUNTER — Ambulatory Visit (INDEPENDENT_AMBULATORY_CARE_PROVIDER_SITE_OTHER): Payer: BLUE CROSS/BLUE SHIELD | Admitting: Obstetrics and Gynecology

## 2016-06-01 VITALS — BP 112/76 | HR 104 | Resp 24 | Ht 63.0 in | Wt 261.8 lb

## 2016-06-01 DIAGNOSIS — D500 Iron deficiency anemia secondary to blood loss (chronic): Secondary | ICD-10-CM | POA: Diagnosis not present

## 2016-06-01 DIAGNOSIS — N920 Excessive and frequent menstruation with regular cycle: Secondary | ICD-10-CM | POA: Diagnosis not present

## 2016-06-01 DIAGNOSIS — R739 Hyperglycemia, unspecified: Secondary | ICD-10-CM

## 2016-06-01 DIAGNOSIS — D259 Leiomyoma of uterus, unspecified: Secondary | ICD-10-CM

## 2016-06-01 NOTE — Progress Notes (Signed)
GYNECOLOGY  VISIT   HPI: 50 y.o.   Single  African American  female   [id number] with Patient's last menstrual period was 05/08/2016 (exact date).   here for surgery consult for surgery.  Patient presented for care on 02/20/16 with heavy and irregular bleeding and known hemoglobin of 7 from outside office.  She had an enlarged uterus 6 - 8 week size and was very symptomatic of her anemia.  She was sent to [facility] for evaluation and treatment of anemia and chest pain with palpitations  She did not have an MI or a PE.  She did receive 1 unit of packed red blood cells and was discharged to home.   In office on 03/01/16, ultrasound showed 2 fibroids - 27 and 21 mm.  EMS 5.89 mm.  Ovaries were normal.  Sonohysterogram showed no filling defects, and endometrial biopsy showed benign polyp.  Hysteroscopy with dilation and curettage were performed on 04/10/16, and final pathology showed benign polypoid endometrium.   Subsequently an attempt was made to place a Mirena IUD on 04/23/16, and this was unsuccessful due to cervical stenosis. Ultrasound guidance for IUD insertion attempted on 04/26/16 also was not successful.   Last menstrual cycle lasted 5 days with pad change every 2 hours.   Tired of taking pills.  Wants to stop the bleeding and wants her energy back. Gained weight and lost hair with Depo Provera.  Has completed childbearing.   No changes in her heath or medications since last office visit. Had hemoglobin A1C at Dr. Baldwin Crown office this week on 05/28/16. Result 6.4 received by fax after patient visit completed.  Hgb 9.6 on 05/30/16.  GYNECOLOGIC HISTORY: Patient's last menstrual period was 05/08/2016 (exact date). Contraception:  None Menopausal hormone therapy:  None Last mammogram:  02/29/16 Density B/Neg/BIRADS1: [facility] Last pap smear:   03/12/16 Neg, Neg HR HPV        OB History    Gravida Para Term Preterm AB Living   3 3 3          SAB TAB Ectopic Multiple Live Births                      Patient Active Problem List   Diagnosis Date Noted  . Iron deficiency anemia 02/15/2016  . Myalgia 02/15/2016  . Upper airway cough syndrome 10/21/2015  . Polyarthralgia 06/16/2015  . Diabetes mellitus type 2, uncomplicated ([place]) [id number]  . Keloid scar, cervical incision 09/02/2013  . Reaction, situational, acute, to stress 07/02/2013  . 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 Severe obesity (BMI >= 40) ([place]) 03/27/2013  . GERD (gastroesophageal reflux disease) 02/15/2013  . Hypocalcemia 01/07/2013  . Chest pain on exertion 07/24/2011    Past Medical History:  Diagnosis Date  . Abnormal Pap smear of cervix 1990   Hx cryotherapy to cervix  . Abnormal uterine bleeding   . Anemia 02/20/16   Transfusion of 1 unit pRBCs  . Anxiety   . Arthritis   . Asthma   . 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 Chest pain    per pt due to anemia-   . Diabetes mellitus without complication ([place])   . Fibroid   . Generalized pain   . GERD (gastroesophageal reflux disease)   . Headache(784.0)   . History of blood transfusion 02/20/2016  . Hyperlipidemia   . Hypertension    per Dr. Irven Shelling note 08/30/2011  . Hypothyroidism   . Leg swelling  bilateral  . Neuromuscular disorder ([place])   . Shortness of breath dyspnea    on exertion due to anemia  . Thyroid disease    Hx 3 goiters--hx thyroidectomy--sees Dr. Forde Dandy    Past Surgical History:  Procedure Laterality Date  . CARDIAC CATHETERIZATION  2013  . CESAREAN SECTION  1995  . CESAREAN SECTION  1996  . COLONOSCOPY    . HYSTEROSCOPY W/D&C N/A 04/10/2016   Procedure: HYSTEROSCOPY WITH fractional dilatation and curretage;  Surgeon: [name], MD;  Location: [facility];  Service: Gynecology;  Laterality: N/A;  . LEFT HEART CATHETERIZATION WITH CORONARY ANGIOGRAM  07/24/2011   Procedure: LEFT HEART CATHETERIZATION WITH CORONARY ANGIOGRAM;  Surgeon: Laverda Page, MD;  Location: [facility] CATH LAB;  Service:  Cardiovascular;;  . THYROIDECTOMY N/A 12/18/2012   Procedure: TOTAL THYROIDECTOMY;  Surgeon: Earnstine Regal, MD;  Location: [facility];  Service: General;  Laterality: N/A;  . UPPER GI ENDOSCOPY      Current Outpatient Prescriptions  Medication Sig Dispense Refill  . albuterol (PROVENTIL HFA;VENTOLIN HFA) 108 (90 Base) MCG/ACT inhaler Inhale 2 puffs into the lungs daily as needed. Reported on 01/04/2016 18 g 1  . ALPRAZolam (XANAX) 0.5 MG tablet TAKE 1 TABLET BY MOUTH EVERY NIGHT AT BEDTIME AS NEEDED FOR SLEEP 30 tablet 0  . Canagliflozin-Metformin HCl (INVOKAMET) 150-500 MG TABS Take 1 tablet by mouth daily. 90 tablet 0  . chlorthalidone (HYGROTON) 25 MG tablet TAKE 1 TABLET(25 MG) BY MOUTH DAILY 90 tablet 1  . diazepam (VALIUM) 5 MG tablet TAKE 1 TABLET BY MOUTH EVERY 6 HOURS AS NEEDED FOR MUSCLE SPASMS 30 tablet 0  . ferrous sulfate 325 (65 FE) MG tablet Take 325 mg by mouth 3 (three) times daily with meals.    Marland Kitchen ibuprofen (ADVIL,MOTRIN) 800 MG tablet Take 1 tablet (800 mg total) by mouth every 8 (eight) hours as needed. 30 tablet 0  . levothyroxine (SYNTHROID, LEVOTHROID) 300 MCG tablet Take 1 tablet (300 mcg total) by mouth daily. 90 tablet 3  . lisinopril (PRINIVIL,ZESTRIL) 2.5 MG tablet TAKE 1 TABLET BY MOUTH EVERY DAY 90 tablet 1  . nystatin (MYCOSTATIN) powder Apply topically 4 (four) times daily. (Patient taking differently: Apply topically 4 (four) times daily as needed (For heat rash.). ) 60 g 2  . pantoprazole (PROTONIX) 40 MG tablet TAKE 1 TABLET BY MOUTH TWICE DAILY 180 tablet 1  . sertraline (ZOLOFT) 100 MG tablet TAKE 1 TABLET BY MOUTH EVERY DAY 30 tablet 2  . sucralfate (CARAFATE) 1 G tablet Take 1 tablet (1 g total) by mouth 4 (four) times daily. 30 tablet 0   No current facility-administered medications for this visit.      ALLERGIES: Latex; Dilaudid [hydromorphone hcl]; and Hydrocodone  Family History  Problem Relation Age of Onset  . Heart disease Mother   . Stroke Mother    . Hypertension Mother   . Heart disease Father   . Colon cancer Father   . Hypertension Father   . Diabetes Father   . Cancer Paternal Grandmother   . Asthma Daughter   . Seizures Daughter   . Hypertension Sister   . Thyroid disease Sister   . Mental illness Daughter 70    suicide attempt 06/2013  . Asthma Sister   . Hypertension Sister   . Breast cancer Paternal Aunt   . Cancer Paternal Aunt     Social History   Social History  . Marital status: Single  Spouse name: n/a  . Number of children: 4  . Years of education: N/A   Occupational History  . [employer]  . receiving (returns)     [employer]   Social History Main Topics  . Smoking status: Never Smoker  . Smokeless tobacco: Never Used  . Alcohol use 0.0 oz/week     Comment: Occasional glass of wine  . Drug use: No  . Sexual activity: Yes    Partners: Male    Birth control/ protection: Abstinence     Comment: Abstinence   Other Topics Concern  . Not on file   Social History Narrative   Lives with the youngest of her 3 daughters.   Her oldest daughter lives in [place].   Her middle daughter lives independently, and is due with her first child 12/01/15.   Her son lives with his father, who moved out 04/2015, nearby.       ROS:  Pertinent items are noted in HPI.  PHYSICAL EXAMINATION:    BP 112/76 (BP Location: Left Arm, Patient Position: Sitting, Cuff Size: Large)   Pulse (!) 104   Resp (!) 24   Ht 5\' 3"  (1.6 m)   Wt 261 lb 12.8 oz (118.8 kg)   LMP 05/08/2016 (Exact Date)   BMI 46.38 kg/m     General appearance: alert, cooperative and appears stated age Head: Normocephalic, without obvious abnormality, atraumatic Neck: no adenopathy, supple, symmetrical, trachea midline and thyroid normal to inspection and palpation Lungs: clear to auscultation bilaterally Heart: regular rate and rhythm Abdomen: soft, non-tender, no masses,  no organomegaly Extremities: extremities normal,  atraumatic, no cyanosis or edema Skin: Skin color, texture, turgor normal. No rashes or lesions Lymph nodes: Cervical, supraclavicular, and axillary nodes normal. No abnormal inguinal nodes palpated Neurologic: Grossly normal  Pelvic: External genitalia:  no lesions              Urethra:  normal appearing urethra with no masses, tenderness or lesions              Bartholins and Skenes: normal                 Vagina: normal appearing vagina with normal color and discharge, no lesions              Cervix: no lesions                Bimanual Exam:  Uterus:  normal size, contour, position, consistency, mobility, non-tender              Adnexa: no mass, fullness, tenderness              Chaperone was present for exam.  ASSESSMENT  Menorrhagia with irregular menses. Hx Cesarean Section x 2. Cervical stenosis.  Unsuccessful Mirena IUD placement with ultrasound guidance.  Hx recent hysteroscopic polypectomy, benign. 2 fibroids - 2.13 and 2.69 cm by prior ultrasound. Anemia. Hgb is improved.  On Fe. DM.   Obesity.  HTN.  PLAN  I discussed robotic hysterectomy with bilateral salpingectomy and cystoscopy.  Ovaries will be removed if they are abnormal.  I reviewed  Risks, benefits, and alternatives.  Risks include but are not limited to bleeding, infection, damage to surrounding organs, pneumonia, reaction to anesthesia, DVT, PE, death, need for reoperation, hernia formation, need to convert to a traditional laparotomy incision to complete the procedure, and menopausal symptoms if ovaries are removed.  Menopausal symptoms can be treated with hormonal and nonhormonal medication.  Surgical procedure and recovery expectations reviewed.    An After Visit Summary was printed and given to the patient.  __25____ minutes face to face time of which over 50% was spent in counseling.

## 2016-06-01 NOTE — Telephone Encounter (Signed)
Patient has a preop today.  I will have [place] draw it here.  I am having difficulty also adding this lab on to her blood already drawn.

## 2016-06-01 NOTE — Telephone Encounter (Signed)
Dr.Silva, I have spoken with the lab at [facility]. They are able to add a Hemoglobin A1C onto the patient's lab work from 05/30/2016. I have attempted to place the order to release to [facility], but they are unable to see it. Could you review order and release? Or place new order to [facility] through [facility] so that I may notify them to process the add on? Thank you.

## 2016-06-01 NOTE — Telephone Encounter (Signed)
Spoke with patient. Advised of message and results as seen below from [place]. Patient is agreeable and verbalizes understanding. Agreeable to having labs performed in office today to check her hemoglobin a1c level. I have added a note to the patient's appointment desk that this needs to be performed.   Routing to provider for final review. Patient agreeable to disposition. Will close encounter.

## 2016-06-01 NOTE — Telephone Encounter (Signed)
-----   Message from [name], MD sent at 05/31/2016  9:59 PM EDT ----- Please contact patient to share her preop labs with her.  Her hemoglobin still shows anemia, but it is stable.   Continue iron three times daily with orange juice.  This helps increase the absorption of the iron, and it will help to raise her potassium which is slightly low.    Her blood sugar was 147.  I would like to have a hemoglobin A1C added to her preop labs if possible.  If it is not possible, I would like to have her return to the office for a lab visit on 9/15 or 9/18 to draw the hemmoglobin A1C.  Her blood type is O positive.   Cc- Marisa Sprinkles

## 2016-06-06 ENCOUNTER — Encounter: Payer: Self-pay | Admitting: Obstetrics and Gynecology

## 2016-06-07 DIAGNOSIS — R202 Paresthesia of skin: Secondary | ICD-10-CM | POA: Diagnosis not present

## 2016-06-07 DIAGNOSIS — R748 Abnormal levels of other serum enzymes: Secondary | ICD-10-CM | POA: Diagnosis not present

## 2016-06-07 DIAGNOSIS — M791 Myalgia: Secondary | ICD-10-CM | POA: Diagnosis not present

## 2016-06-07 DIAGNOSIS — M2550 Pain in unspecified joint: Secondary | ICD-10-CM | POA: Diagnosis not present

## 2016-06-10 MED ORDER — DEXTROSE 5 % IV SOLN
2.0000 g | INTRAVENOUS | Status: AC
  Administered 2016-06-11: 2 g via INTRAVENOUS
  Filled 2016-06-10: qty 2

## 2016-06-10 MED ORDER — ENOXAPARIN SODIUM 40 MG/0.4ML ~~LOC~~ SOLN
40.0000 mg | SUBCUTANEOUS | Status: DC
  Filled 2016-06-10: qty 0.4

## 2016-06-10 MED ORDER — ENOXAPARIN SODIUM 40 MG/0.4ML ~~LOC~~ SOLN
40.0000 mg | SUBCUTANEOUS | Status: AC
  Administered 2016-06-11: 40 mg via SUBCUTANEOUS
  Filled 2016-06-10: qty 0.4

## 2016-06-10 NOTE — H&P (Signed)
Office Visit   06/01/2016 [place], MD  Obstetrics and Gynecology   Uterine leiomyoma, unspecified location +2 more  Dx   Advice Only ; Referred by Harrison Mons, PA-C  Reason for Visit   Additional Documentation   Vitals:   BP 112/76 (BP Location: Left Arm, Patient Position: Sitting, Cuff Size: Large)   Pulse  104   Resp  24   Ht 5\' 3"  (1.6 m)   Wt 261 lb 12.8 oz (118.8 kg)   LMP 05/08/2016 (Exact Date)   BMI 46.38 kg/m   BSA 2.3 m      More Vitals   Flowsheets:   Infectious Disease Screening,   Custom Formula Data,   MEWS Score,   Anthropometrics     Encounter Info:   Billing Info,   History,   Allergies,   Detailed Report     All Notes   Progress Notes by [name], MD at 06/01/2016 3:00 PM   Author: [name], MD Author Type: Physician Filed: 06/06/2016 10:48 AM  Note Status: Signed Cosign: Cosign Not Required Encounter Date: 06/01/2016  Editor: [name], MD (Physician)  Prior Versions: 1. Avel Sensor, CMA (Physiological scientist) at 06/01/2016 3:15 PM - Sign at close encounter    GYNECOLOGY  VISIT   HPI: 50 y.o.   Single  African American  female   [id number] with Patient's last menstrual period was 05/08/2016 (exact date).   here for surgery consult for surgery.  Patient presented for care on 02/20/16 with heavy and irregular bleeding and known hemoglobin of 7 from outside office.  She had an enlarged uterus 6 - 8 week size and was very symptomatic of her anemia.  She was sent to [facility] for evaluation and treatment of anemia and chest pain with palpitations  She did not have an MI or a PE.  She did receive 1 unit of packed red blood cells and was discharged to home.   In office on 03/01/16, ultrasound showed 2 fibroids - 27 and 21 mm.  EMS 5.89 mm.  Ovaries were normal.  Sonohysterogram showed no filling defects, and endometrial biopsy showed benign  polyp.  Hysteroscopy with dilation and curettage were performed on 04/10/16, and final pathology showed benign polypoid endometrium.   Subsequently an attempt was made to place a Mirena IUD on 04/23/16, and this was unsuccessful due to cervical stenosis. Ultrasound guidance for IUD insertion attempted on 04/26/16 also was not successful.   Last menstrual cycle lasted 5 days with pad change every 2 hours.   Tired of taking pills.  Wants to stop the bleeding and wants her energy back. Gained weight and lost hair with Depo Provera.  Has completed childbearing.   No changes in her heath or medications since last office visit. Had hemoglobin A1C at Dr. Baldwin Crown office this week on 05/28/16. Result 6.4 received by fax after patient visit completed.  Hgb 9.6 on 05/30/16.  GYNECOLOGIC HISTORY: Patient's last menstrual period was 05/08/2016 (exact date). Contraception:  None Menopausal hormone therapy:  None Last mammogram:  02/29/16 Density B/Neg/BIRADS1: [facility] Last pap smear:   03/12/16 Neg, Neg HR HPV                OB History    Gravida Para Term Preterm AB Living   3 3 3          SAB TAB Ectopic Multiple  Live Births                         Patient Active Problem List   Diagnosis Date Noted  . Iron deficiency anemia 02/15/2016  . Myalgia 02/15/2016  . Upper airway cough syndrome 10/21/2015  . Polyarthralgia 06/16/2015  . Diabetes mellitus type 2, uncomplicated ([place]) [id number]  . Keloid scar, cervical incision 09/02/2013  . Reaction, situational, acute, to stress 07/02/2013  . 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 Severe obesity (BMI >= 40) ([place]) 03/27/2013  . GERD (gastroesophageal reflux disease) 02/15/2013  . Hypocalcemia 01/07/2013  . Chest pain on exertion 07/24/2011        Past Medical History:  Diagnosis Date  . Abnormal Pap smear of cervix 1990   Hx cryotherapy to cervix  . Abnormal uterine bleeding   . Anemia 02/20/16   Transfusion  of 1 unit pRBCs  . Anxiety   . Arthritis   . Asthma   . 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 Chest pain    per pt due to anemia-   . Diabetes mellitus without complication ([place])   . Fibroid   . Generalized pain   . GERD (gastroesophageal reflux disease)   . Headache(784.0)   . History of blood transfusion 02/20/2016  . Hyperlipidemia   . Hypertension    per Dr. Irven Shelling note 08/30/2011  . Hypothyroidism   . Leg swelling    bilateral  . Neuromuscular disorder ([place])   . Shortness of breath dyspnea    on exertion due to anemia  . Thyroid disease    Hx 3 goiters--hx thyroidectomy--sees Dr. Forde Dandy         Past Surgical History:  Procedure Laterality Date  . CARDIAC CATHETERIZATION  2013  . CESAREAN SECTION  1995  . CESAREAN SECTION  1996  . COLONOSCOPY    . HYSTEROSCOPY W/D&C N/A 04/10/2016   Procedure: HYSTEROSCOPY WITH fractional dilatation and curretage;  Surgeon: [name], MD;  Location: [facility];  Service: Gynecology;  Laterality: N/A;  . LEFT HEART CATHETERIZATION WITH CORONARY ANGIOGRAM  07/24/2011   Procedure: LEFT HEART CATHETERIZATION WITH CORONARY ANGIOGRAM;  Surgeon: Laverda Page, MD;  Location: [facility] C[facility] LAB;  Service: Cardiovascular;;  . THYROIDECTOMY N/A 12/18/2012   Procedure: TOTAL THYROIDECTOMY;  Surgeon: Earnstine Regal, MD;  Location: [facility];  Service: General;  Laterality: N/A;  . UPPER GI ENDOSCOPY            Current Outpatient Prescriptions  Medication Sig Dispense Refill  . albuterol (PROVENTIL HFA;VENTOLIN HFA) 108 (90 Base) MCG/ACT inhaler Inhale 2 puffs into the lungs daily as needed. Reported on 01/04/2016 18 g 1  . ALPRAZolam (XANAX) 0.5 MG tablet TAKE 1 TABLET BY MOUTH EVERY NIGHT AT BEDTIME AS NEEDED FOR SLEEP 30 tablet 0  . Canagliflozin-Metformin HCl (INVOKAMET) 150-500 MG TABS Take 1 tablet by mouth daily. 90 tablet 0  . chlorthalidone (HYGROTON) 25 MG tablet TAKE 1 TABLET(25 MG) BY MOUTH  DAILY 90 tablet 1  . diazepam (VALIUM) 5 MG tablet TAKE 1 TABLET BY MOUTH EVERY 6 HOURS AS NEEDED FOR MUSCLE SPASMS 30 tablet 0  . ferrous sulfate 325 (65 FE) MG tablet Take 325 mg by mouth 3 (three) times daily with meals.    Marland Kitchen ibuprofen (ADVIL,MOTRIN) 800 MG tablet Take 1 tablet (800 mg total) by mouth every 8 (eight) hours as needed. 30 tablet 0  . levothyroxine (SYNTHROID, LEVOTHROID) 300 MCG tablet  Take 1 tablet (300 mcg total) by mouth daily. 90 tablet 3  . lisinopril (PRINIVIL,ZESTRIL) 2.5 MG tablet TAKE 1 TABLET BY MOUTH EVERY DAY 90 tablet 1  . nystatin (MYCOSTATIN) powder Apply topically 4 (four) times daily. (Patient taking differently: Apply topically 4 (four) times daily as needed (For heat rash.). ) 60 g 2  . pantoprazole (PROTONIX) 40 MG tablet TAKE 1 TABLET BY MOUTH TWICE DAILY 180 tablet 1  . sertraline (ZOLOFT) 100 MG tablet TAKE 1 TABLET BY MOUTH EVERY DAY 30 tablet 2  . sucralfate (CARAFATE) 1 G tablet Take 1 tablet (1 g total) by mouth 4 (four) times daily. 30 tablet 0   No current facility-administered medications for this visit.      ALLERGIES: Latex; Dilaudid [hydromorphone hcl]; and Hydrocodone        Family History  Problem Relation Age of Onset  . Heart disease Mother   . Stroke Mother   . Hypertension Mother   . Heart disease Father   . Colon cancer Father   . Hypertension Father   . Diabetes Father   . Cancer Paternal Grandmother   . Asthma Daughter   . Seizures Daughter   . Hypertension Sister   . Thyroid disease Sister   . Mental illness Daughter 93    suicide attempt 06/2013  . Asthma Sister   . Hypertension Sister   . Breast cancer Paternal Aunt   . Cancer Paternal Aunt     Social History        Social History  . Marital status: Single    Spouse name: n/a  . Number of children: 4  . Years of education: N/A        Occupational History  . [employer]  . receiving (returns)     [employer]          Social History Main Topics  . Smoking status: Never Smoker  . Smokeless tobacco: Never Used  . Alcohol use 0.0 oz/week     Comment: Occasional glass of wine  . Drug use: No  . Sexual activity: Yes    Partners: Male    Birth control/ protection: Abstinence     Comment: Abstinence       Other Topics Concern  . Not on file      Social History Narrative   Lives with the youngest of her 3 daughters.   Her oldest daughter lives in [place].   Her middle daughter lives independently, and is due with her first child 12/01/15.   Her son lives with his father, who moved out 04/2015, nearby.       ROS:  Pertinent items are noted in HPI.  PHYSICAL EXAMINATION:    BP 112/76 (BP Location: Left Arm, Patient Position: Sitting, Cuff Size: Large)   Pulse (!) 104   Resp (!) 24   Ht 5\' 3"  (1.6 m)   Wt 261 lb 12.8 oz (118.8 kg)   LMP 05/08/2016 (Exact Date)   BMI 46.38 kg/m     General appearance: alert, cooperative and appears stated age Head: Normocephalic, without obvious abnormality, atraumatic Neck: no adenopathy, supple, symmetrical, trachea midline and thyroid normal to inspection and palpation Lungs: clear to auscultation bilaterally Heart: regular rate and rhythm Abdomen: soft, non-tender, no masses,  no organomegaly Extremities: extremities normal, atraumatic, no cyanosis or edema Skin: Skin color, texture, turgor normal. No rashes or lesions Lymph nodes: Cervical, supraclavicular, and axillary nodes normal. No abnormal inguinal nodes palpated Neurologic: Grossly normal  Pelvic: External genitalia:  no lesions              Urethra:  normal appearing urethra with no masses, tenderness or lesions              Bartholins and Skenes: normal                 Vagina: normal appearing vagina with normal color and discharge, no lesions              Cervix: no lesions                Bimanual Exam:  Uterus:  normal size, contour, position,  consistency, mobility, non-tender              Adnexa: no mass, fullness, tenderness              Chaperone was present for exam.  ASSESSMENT  Menorrhagia with irregular menses. Hx Cesarean Section x 2. Cervical stenosis.  Unsuccessful Mirena IUD placement with ultrasound guidance.  Hx recent hysteroscopic polypectomy, benign. 2 fibroids - 2.13 and 2.69 cm by prior ultrasound. Anemia. Hgb is improved.  On Fe. DM.   Obesity.  HTN.  PLAN  I discussed robotic hysterectomy with bilateral salpingectomy and cystoscopy.  Ovaries will be removed if they are abnormal.  I reviewed  Risks, benefits, and alternatives.  Risks include but are not limited to bleeding, infection, damage to surrounding organs, pneumonia, reaction to anesthesia, DVT, PE, death, need for reoperation, hernia formation, need to convert to a traditional laparotomy incision to complete the procedure, and menopausal symptoms if ovaries are removed.  Menopausal symptoms can be treated with hormonal and nonhormonal medication.  Surgical procedure and recovery expectations reviewed.    An After Visit Summary was printed and given to the patient.  __25____ minutes face to face time of which over 50% was spent in counseling.

## 2016-06-11 ENCOUNTER — Observation Stay (HOSPITAL_COMMUNITY)
Admission: RE | Admit: 2016-06-11 | Discharge: 2016-06-12 | Disposition: A | Discharge status: Home / Self Care | Payer: BLUE CROSS/BLUE SHIELD | Source: Ambulatory Visit | Attending: Obstetrics and Gynecology | Admitting: Obstetrics and Gynecology

## 2016-06-11 ENCOUNTER — Encounter (HOSPITAL_COMMUNITY): Payer: Self-pay | Admitting: Anesthesiology

## 2016-06-11 ENCOUNTER — Encounter (HOSPITAL_COMMUNITY)
Admission: RE | Disposition: A | Discharge status: Home / Self Care | Payer: Self-pay | Source: Ambulatory Visit | Attending: Obstetrics and Gynecology

## 2016-06-11 ENCOUNTER — Ambulatory Visit (HOSPITAL_COMMUNITY): Payer: BLUE CROSS/BLUE SHIELD | Admitting: Anesthesiology

## 2016-06-11 DIAGNOSIS — Z8543 Personal history of malignant neoplasm of ovary: Secondary | ICD-10-CM | POA: Diagnosis not present

## 2016-06-11 DIAGNOSIS — I10 Essential (primary) hypertension: Secondary | ICD-10-CM | POA: Diagnosis not present

## 2016-06-11 DIAGNOSIS — J45909 Unspecified asthma, uncomplicated: Secondary | ICD-10-CM | POA: Diagnosis not present

## 2016-06-11 DIAGNOSIS — D649 Anemia, unspecified: Secondary | ICD-10-CM | POA: Diagnosis not present

## 2016-06-11 DIAGNOSIS — Z79899 Other long term (current) drug therapy: Secondary | ICD-10-CM | POA: Insufficient documentation

## 2016-06-11 DIAGNOSIS — E119 Type 2 diabetes mellitus without complications: Secondary | ICD-10-CM | POA: Insufficient documentation

## 2016-06-11 DIAGNOSIS — N921 Excessive and frequent menstruation with irregular cycle: Secondary | ICD-10-CM | POA: Diagnosis not present

## 2016-06-11 DIAGNOSIS — Z90710 Acquired absence of both cervix and uterus: Secondary | ICD-10-CM | POA: Diagnosis not present

## 2016-06-11 DIAGNOSIS — Z91040 Latex allergy status: Secondary | ICD-10-CM | POA: Insufficient documentation

## 2016-06-11 DIAGNOSIS — E039 Hypothyroidism, unspecified: Secondary | ICD-10-CM | POA: Insufficient documentation

## 2016-06-11 DIAGNOSIS — D259 Leiomyoma of uterus, unspecified: Secondary | ICD-10-CM | POA: Diagnosis not present

## 2016-06-11 HISTORY — PX: ROBOTIC ASSISTED TOTAL HYSTERECTOMY WITH SALPINGECTOMY: SHX6679

## 2016-06-11 HISTORY — PX: CYSTOSCOPY: SHX5120

## 2016-06-11 LAB — CBC
HEMATOCRIT: 30.5 % — AB (ref 36.0–46.0)
HEMOGLOBIN: 9.4 g/dL — AB (ref 12.0–15.0)
MCH: 23.2 pg — ABNORMAL LOW (ref 26.0–34.0)
MCHC: 30.8 g/dL (ref 30.0–36.0)
MCV: 75.3 fL — AB (ref 78.0–100.0)
Platelets: 526 10*3/uL — ABNORMAL HIGH (ref 150–400)
RBC: 4.05 MIL/uL (ref 3.87–5.11)
RDW: 18.6 % — ABNORMAL HIGH (ref 11.5–15.5)
WBC: 11.2 10*3/uL — ABNORMAL HIGH (ref 4.0–10.5)

## 2016-06-11 LAB — CREATININE, SERUM: Creatinine, Ser: 0.91 mg/dL (ref 0.44–1.00)

## 2016-06-11 LAB — TYPE AND SCREEN
ABO/RH(D): O POS
ANTIBODY SCREEN: NEGATIVE

## 2016-06-11 LAB — GLUCOSE, CAPILLARY
GLUCOSE-CAPILLARY: 117 mg/dL — AB (ref 65–99)
GLUCOSE-CAPILLARY: 123 mg/dL — AB (ref 65–99)
GLUCOSE-CAPILLARY: 162 mg/dL — AB (ref 65–99)
Glucose-Capillary: 141 mg/dL — ABNORMAL HIGH (ref 65–99)

## 2016-06-11 LAB — PREGNANCY, URINE: PREG TEST UR: NEGATIVE

## 2016-06-11 SURGERY — ROBOTIC ASSISTED TOTAL HYSTERECTOMY WITH SALPINGECTOMY
Anesthesia: General | Site: Bladder

## 2016-06-11 MED ORDER — LACTATED RINGERS IV SOLN: INTRAVENOUS | Status: DC

## 2016-06-11 MED ORDER — DEXAMETHASONE SODIUM PHOSPHATE 10 MG/ML IJ SOLN
INTRAMUSCULAR | Status: DC | PRN
  Administered 2016-06-11: 4 mg via INTRAVENOUS

## 2016-06-11 MED ORDER — LACTATED RINGERS IV SOLN
INTRAVENOUS | Status: DC
  Administered 2016-06-11 – 2016-06-12 (×3): via INTRAVENOUS

## 2016-06-11 MED ORDER — FENTANYL CITRATE (PF) 100 MCG/2ML IJ SOLN
25.0000 ug | INTRAMUSCULAR | Status: DC | PRN
Start: 2016-06-11 — End: 2016-06-11
  Administered 2016-06-11 (×3): 50 ug via INTRAVENOUS

## 2016-06-11 MED ORDER — ONDANSETRON HCL 4 MG/2ML IJ SOLN: 4.0000 mg | Freq: Four times a day (QID) | INTRAMUSCULAR | Status: DC | PRN

## 2016-06-11 MED ORDER — LIDOCAINE HCL (CARDIAC) 20 MG/ML IV SOLN
INTRAVENOUS | Status: AC
  Filled 2016-06-11: qty 5

## 2016-06-11 MED ORDER — ONDANSETRON HCL 4 MG/2ML IJ SOLN
INTRAMUSCULAR | Status: DC | PRN
  Administered 2016-06-11: 4 mg via INTRAVENOUS

## 2016-06-11 MED ORDER — PANTOPRAZOLE SODIUM 40 MG PO TBEC
40.0000 mg | DELAYED_RELEASE_TABLET | Freq: Two times a day (BID) | ORAL | Status: DC
  Administered 2016-06-11 – 2016-06-12 (×2): 40 mg via ORAL
  Filled 2016-06-11 (×2): qty 1

## 2016-06-11 MED ORDER — ARTIFICIAL TEARS OP OINT
TOPICAL_OINTMENT | OPHTHALMIC | Status: AC
  Filled 2016-06-11: qty 3.5

## 2016-06-11 MED ORDER — ACETAMINOPHEN 10 MG/ML IV SOLN
1000.0000 mg | Freq: Once | INTRAVENOUS | Status: AC
  Administered 2016-06-11: 1000 mg via INTRAVENOUS
  Filled 2016-06-11: qty 100

## 2016-06-11 MED ORDER — GLYCOPYRROLATE 0.2 MG/ML IJ SOLN
INTRAMUSCULAR | Status: DC | PRN
Start: 2016-06-11 — End: 2016-06-11
  Administered 2016-06-11: .8 mg via INTRAVENOUS

## 2016-06-11 MED ORDER — PROPOFOL 10 MG/ML IV BOLUS
INTRAVENOUS | Status: AC
  Filled 2016-06-11: qty 20

## 2016-06-11 MED ORDER — MEPERIDINE HCL 25 MG/ML IJ SOLN
6.2500 mg | INTRAMUSCULAR | Status: DC | PRN
  Administered 2016-06-11: 12.5 mg via INTRAVENOUS

## 2016-06-11 MED ORDER — SCOPOLAMINE 1 MG/3DAYS TD PT72
MEDICATED_PATCH | TRANSDERMAL | Status: AC
  Administered 2016-06-11: 1.5 mg via TRANSDERMAL
  Filled 2016-06-11: qty 1

## 2016-06-11 MED ORDER — LACTATED RINGERS IV SOLN
INTRAVENOUS | Status: DC
  Administered 2016-06-11 (×2): via INTRAVENOUS

## 2016-06-11 MED ORDER — MORPHINE SULFATE (PF) 4 MG/ML IV SOLN
2.0000 mg | INTRAVENOUS | Status: DC | PRN
  Administered 2016-06-11 – 2016-06-12 (×2): 2 mg via INTRAVENOUS
  Filled 2016-06-11 (×2): qty 1

## 2016-06-11 MED ORDER — NEOSTIGMINE METHYLSULFATE 10 MG/10ML IV SOLN
INTRAVENOUS | Status: AC
  Filled 2016-06-11: qty 1

## 2016-06-11 MED ORDER — ROCURONIUM BROMIDE 100 MG/10ML IV SOLN
INTRAVENOUS | Status: DC | PRN
  Administered 2016-06-11: 20 mg via INTRAVENOUS
  Administered 2016-06-11: 50 mg via INTRAVENOUS
  Administered 2016-06-11: 20 mg via INTRAVENOUS
  Administered 2016-06-11: 10 mg via INTRAVENOUS

## 2016-06-11 MED ORDER — DEXAMETHASONE SODIUM PHOSPHATE 4 MG/ML IJ SOLN
INTRAMUSCULAR | Status: AC
  Filled 2016-06-11: qty 1

## 2016-06-11 MED ORDER — FENTANYL CITRATE (PF) 100 MCG/2ML IJ SOLN
INTRAMUSCULAR | Status: AC
  Filled 2016-06-11: qty 4

## 2016-06-11 MED ORDER — MORPHINE SULFATE (PF) 4 MG/ML IV SOLN
INTRAVENOUS | Status: AC
  Filled 2016-06-11: qty 1

## 2016-06-11 MED ORDER — LEVOTHYROXINE SODIUM 150 MCG PO TABS
300.0000 ug | ORAL_TABLET | Freq: Every day | ORAL | Status: DC
  Administered 2016-06-12: 300 ug via ORAL
  Filled 2016-06-11 (×2): qty 2

## 2016-06-11 MED ORDER — ROPIVACAINE HCL 5 MG/ML IJ SOLN
INTRAMUSCULAR | Status: AC
Start: 2016-06-11 — End: 2016-06-11
  Filled 2016-06-11: qty 30

## 2016-06-11 MED ORDER — METOCLOPRAMIDE HCL 5 MG/ML IJ SOLN: 10.0000 mg | Freq: Once | INTRAMUSCULAR | Status: DC | PRN

## 2016-06-11 MED ORDER — LACTATED RINGERS IR SOLN
Status: DC | PRN
  Administered 2016-06-11: 3000 mL

## 2016-06-11 MED ORDER — ONDANSETRON HCL 4 MG PO TABS: 4.0000 mg | ORAL_TABLET | Freq: Four times a day (QID) | ORAL | Status: DC | PRN

## 2016-06-11 MED ORDER — SODIUM CHLORIDE 0.9 % IV SOLN
INTRAVENOUS | Status: DC | PRN
  Administered 2016-06-11: 60 mL

## 2016-06-11 MED ORDER — PROPOFOL 10 MG/ML IV BOLUS
INTRAVENOUS | Status: DC | PRN
  Administered 2016-06-11: 200 mg via INTRAVENOUS
  Administered 2016-06-11: 50 mg via INTRAVENOUS

## 2016-06-11 MED ORDER — MEPERIDINE HCL 25 MG/ML IJ SOLN
INTRAMUSCULAR | Status: AC
  Filled 2016-06-11: qty 1

## 2016-06-11 MED ORDER — ARTIFICIAL TEARS OP OINT
TOPICAL_OINTMENT | OPHTHALMIC | Status: DC | PRN
  Administered 2016-06-11: 1 via OPHTHALMIC

## 2016-06-11 MED ORDER — SERTRALINE HCL 100 MG PO TABS
100.0000 mg | ORAL_TABLET | Freq: Every day | ORAL | Status: DC
  Administered 2016-06-12: 100 mg via ORAL
  Filled 2016-06-11 (×2): qty 1

## 2016-06-11 MED ORDER — FENTANYL CITRATE (PF) 100 MCG/2ML IJ SOLN
INTRAMUSCULAR | Status: DC | PRN
  Administered 2016-06-11 (×8): 50 ug via INTRAVENOUS

## 2016-06-11 MED ORDER — ROCURONIUM BROMIDE 100 MG/10ML IV SOLN
INTRAVENOUS | Status: AC
  Filled 2016-06-11: qty 1

## 2016-06-11 MED ORDER — ONDANSETRON HCL 4 MG/2ML IJ SOLN: 4.0000 mg | Freq: Three times a day (TID) | INTRAMUSCULAR | Status: DC | PRN

## 2016-06-11 MED ORDER — GLYCOPYRROLATE 0.2 MG/ML IJ SOLN
INTRAMUSCULAR | Status: AC
  Filled 2016-06-11: qty 4

## 2016-06-11 MED ORDER — LACTATED RINGERS IV BOLUS (SEPSIS)
500.0000 mL | Freq: Once | INTRAVENOUS | Status: AC
  Administered 2016-06-11: 250 mL via INTRAVENOUS

## 2016-06-11 MED ORDER — CHLORTHALIDONE 25 MG PO TABS
25.0000 mg | ORAL_TABLET | Freq: Every day | ORAL | Status: DC
  Administered 2016-06-12: 25 mg via ORAL
  Filled 2016-06-11 (×2): qty 1

## 2016-06-11 MED ORDER — FENTANYL CITRATE (PF) 100 MCG/2ML IJ SOLN
INTRAMUSCULAR | Status: AC
  Filled 2016-06-11: qty 2

## 2016-06-11 MED ORDER — ENOXAPARIN SODIUM 40 MG/0.4ML ~~LOC~~ SOLN
40.0000 mg | SUBCUTANEOUS | Status: DC
  Administered 2016-06-12: 40 mg via SUBCUTANEOUS
  Filled 2016-06-11 (×2): qty 0.4

## 2016-06-11 MED ORDER — NEOSTIGMINE METHYLSULFATE 10 MG/10ML IV SOLN
INTRAVENOUS | Status: DC | PRN
  Administered 2016-06-11: 5 mg via INTRAVENOUS

## 2016-06-11 MED ORDER — LISINOPRIL 2.5 MG PO TABS
2.5000 mg | ORAL_TABLET | Freq: Every day | ORAL | Status: DC
  Administered 2016-06-12: 2.5 mg via ORAL
  Filled 2016-06-11 (×2): qty 1

## 2016-06-11 MED ORDER — IBUPROFEN 600 MG PO TABS
600.0000 mg | ORAL_TABLET | Freq: Four times a day (QID) | ORAL | Status: DC | PRN
Start: 2016-06-11 — End: 2016-06-12
  Administered 2016-06-12: 600 mg via ORAL
  Filled 2016-06-11: qty 1

## 2016-06-11 MED ORDER — MIDAZOLAM HCL 2 MG/2ML IJ SOLN
INTRAMUSCULAR | Status: AC
  Filled 2016-06-11: qty 2

## 2016-06-11 MED ORDER — ALPRAZOLAM 0.5 MG PO TABS: 0.5000 mg | ORAL_TABLET | Freq: Every evening | ORAL | Status: DC | PRN

## 2016-06-11 MED ORDER — ALBUTEROL SULFATE (2.5 MG/3ML) 0.083% IN NEBU: 2.5000 mg | INHALATION_SOLUTION | Freq: Every day | RESPIRATORY_TRACT | Status: DC | PRN

## 2016-06-11 MED ORDER — SCOPOLAMINE 1 MG/3DAYS TD PT72
1.0000 | MEDICATED_PATCH | Freq: Once | TRANSDERMAL | Status: DC
  Administered 2016-06-11: 1.5 mg via TRANSDERMAL

## 2016-06-11 MED ORDER — OXYCODONE-ACETAMINOPHEN 5-325 MG PO TABS
1.0000 | ORAL_TABLET | ORAL | Status: DC | PRN
  Administered 2016-06-12: 1 via ORAL
  Filled 2016-06-11: qty 1

## 2016-06-11 MED ORDER — SUCRALFATE 1 G PO TABS
1.0000 g | ORAL_TABLET | Freq: Four times a day (QID) | ORAL | Status: DC
  Administered 2016-06-11 – 2016-06-12 (×4): 1 g via ORAL
  Filled 2016-06-11 (×9): qty 1

## 2016-06-11 MED ORDER — KETOROLAC TROMETHAMINE 30 MG/ML IJ SOLN
30.0000 mg | Freq: Four times a day (QID) | INTRAMUSCULAR | Status: AC
  Administered 2016-06-11 – 2016-06-12 (×3): 30 mg via INTRAVENOUS
  Filled 2016-06-11 (×3): qty 1

## 2016-06-11 MED ORDER — ONDANSETRON HCL 4 MG/2ML IJ SOLN
INTRAMUSCULAR | Status: AC
  Filled 2016-06-11: qty 2

## 2016-06-11 MED ORDER — SODIUM CHLORIDE 0.9 % IJ SOLN
INTRAMUSCULAR | Status: AC
  Filled 2016-06-11: qty 50

## 2016-06-11 MED ORDER — MENTHOL 3 MG MT LOZG: 1.0000 | LOZENGE | OROMUCOSAL | Status: DC | PRN

## 2016-06-11 MED ORDER — BUPIVACAINE HCL (PF) 0.25 % IJ SOLN
INTRAMUSCULAR | Status: DC | PRN
  Administered 2016-06-11: 15 mL

## 2016-06-11 MED ORDER — LIDOCAINE HCL (CARDIAC) 20 MG/ML IV SOLN
INTRAVENOUS | Status: DC | PRN
  Administered 2016-06-11: 80 mg via INTRAVENOUS

## 2016-06-11 MED ORDER — BUPIVACAINE HCL (PF) 0.25 % IJ SOLN
INTRAMUSCULAR | Status: AC
Start: 2016-06-11 — End: 2016-06-11
  Filled 2016-06-11: qty 30

## 2016-06-11 MED ORDER — MORPHINE SULFATE (PF) 4 MG/ML IV SOLN
4.0000 mg | INTRAVENOUS | Status: AC | PRN
Start: 2016-06-11 — End: 2016-06-11
  Administered 2016-06-11 (×3): 4 mg via INTRAVENOUS

## 2016-06-11 MED ORDER — STERILE WATER FOR IRRIGATION IR SOLN
Status: DC | PRN
  Administered 2016-06-11: 1000 mL via INTRAVESICAL

## 2016-06-11 MED ORDER — KETOROLAC TROMETHAMINE 30 MG/ML IJ SOLN
INTRAMUSCULAR | Status: AC
  Filled 2016-06-11: qty 1

## 2016-06-11 MED ORDER — MIDAZOLAM HCL 2 MG/2ML IJ SOLN
INTRAMUSCULAR | Status: DC | PRN
  Administered 2016-06-11: 2 mg via INTRAVENOUS

## 2016-06-11 SURGICAL SUPPLY — 58 items
APPLICATOR ARISTA FLEXITIP XL (MISCELLANEOUS) ×3 IMPLANT
BARRIER ADHS 3X4 INTERCEED (GAUZE/BANDAGES/DRESSINGS) IMPLANT
CANISTER SUCT 3000ML (MISCELLANEOUS) ×3 IMPLANT
CATH FOLEY 3WAY  5CC 16FR (CATHETERS) ×1
CATH FOLEY 3WAY 5CC 16FR (CATHETERS) ×2 IMPLANT
CLOTH BEACON ORANGE TIMEOUT ST (SAFETY) ×3 IMPLANT
CONT PATH 16OZ SNAP LID 3702 (MISCELLANEOUS) ×3 IMPLANT
COVER BACK TABLE 60X90IN (DRAPES) ×6 IMPLANT
COVER TIP SHEARS 8 DVNC (MISCELLANEOUS) ×2 IMPLANT
COVER TIP SHEARS 8MM DA VINCI (MISCELLANEOUS) ×1
DECANTER SPIKE VIAL GLASS SM (MISCELLANEOUS) ×9 IMPLANT
DRAPE ROBOTICS STRL (DRAPES) IMPLANT
DURAPREP 26ML APPLICATOR (WOUND CARE) ×3 IMPLANT
ELECT REM PT RETURN 9FT ADLT (ELECTROSURGICAL) ×3
ELECTRODE REM PT RTRN 9FT ADLT (ELECTROSURGICAL) ×2 IMPLANT
GAUZE VASELINE 3X9 (GAUZE/BANDAGES/DRESSINGS) IMPLANT
GLOVE BIO SURGEON STRL SZ 6.5 (GLOVE) ×9 IMPLANT
GLOVE BIOGEL PI IND STRL 7.0 (GLOVE) ×10 IMPLANT
GLOVE BIOGEL PI INDICATOR 7.0 (GLOVE) ×5
GOWN STRL REUS W/TWL LRG LVL3 (GOWN DISPOSABLE) ×6 IMPLANT
HEMOSTAT ARISTA ABSORB 3G PWDR (MISCELLANEOUS) ×3 IMPLANT
KIT ACCESSORY DA VINCI DISP (KITS) ×1
KIT ACCESSORY DVNC DISP (KITS) ×2 IMPLANT
LEGGING LITHOTOMY PAIR STRL (DRAPES) ×3 IMPLANT
LIQUID BAND (GAUZE/BANDAGES/DRESSINGS) ×3 IMPLANT
NEEDLE INSUFFLATION 120MM (ENDOMECHANICALS) ×3 IMPLANT
NEEDLE INSUFFLATION 150MM (ENDOMECHANICALS) ×3 IMPLANT
OCCLUDER COLPOPNEUMO (BALLOONS) ×3 IMPLANT
PACK ROBOT WH (CUSTOM PROCEDURE TRAY) ×3 IMPLANT
PACK ROBOTIC GOWN (GOWN DISPOSABLE) ×3 IMPLANT
PACK TRENDGUARD 600 HYBRD PROC (MISCELLANEOUS) ×2 IMPLANT
PACK VAGINAL MINOR WOMEN LF (CUSTOM PROCEDURE TRAY) IMPLANT
PAD PREP 24X48 CUFFED NSTRL (MISCELLANEOUS) ×6 IMPLANT
PAD TRENDELENBURG POSITION (MISCELLANEOUS) IMPLANT
SCISSORS LAP 5X35 DISP (ENDOMECHANICALS) ×3 IMPLANT
SET CYSTO W/LG BORE CLAMP LF (SET/KITS/TRAYS/PACK) ×3 IMPLANT
SET IRRIG TUBING LAPAROSCOPIC (IRRIGATION / IRRIGATOR) ×3 IMPLANT
SET TRI-LUMEN FLTR TB AIRSEAL (TUBING) ×3 IMPLANT
SUT VIC AB 0 CT2 27 (SUTURE) ×6 IMPLANT
SUT VIC AB 4-0 PS2 27 (SUTURE) ×6 IMPLANT
SUT VICRYL 0 UR6 27IN ABS (SUTURE) ×3 IMPLANT
SUT VLOC 180 0 9IN  GS21 (SUTURE) ×2
SUT VLOC 180 0 9IN GS21 (SUTURE) ×4 IMPLANT
SYR 50ML LL SCALE MARK (SYRINGE) ×6 IMPLANT
SYSTEM CARTER THOMASON II (TROCAR) ×3 IMPLANT
TIP RUMI ORANGE 6.7MMX12CM (TIP) IMPLANT
TIP UTERINE 5.1X6CM LAV DISP (MISCELLANEOUS) IMPLANT
TIP UTERINE 6.7X10CM GRN DISP (MISCELLANEOUS) ×3 IMPLANT
TIP UTERINE 6.7X6CM WHT DISP (MISCELLANEOUS) IMPLANT
TIP UTERINE 6.7X8CM BLUE DISP (MISCELLANEOUS) ×3 IMPLANT
TOWEL OR 17X24 6PK STRL BLUE (TOWEL DISPOSABLE) ×9 IMPLANT
TRAY FOLEY CATH SILVER 14FR (SET/KITS/TRAYS/PACK) IMPLANT
TRENDGUARD 600 HYBRID PROC PK (MISCELLANEOUS) ×3
TROCAR DILATING TIP 12MM 150MM (ENDOMECHANICALS) ×3 IMPLANT
TROCAR DISP BLADELESS 8 DVNC (TROCAR) ×2 IMPLANT
TROCAR DISP BLADELESS 8MM (TROCAR) ×1
TROCAR PORT AIRSEAL 8X100 (TROCAR) ×6 IMPLANT
WATER STERILE IRR 1000ML POUR (IV SOLUTION) ×3 IMPLANT

## 2016-06-11 NOTE — Progress Notes (Signed)
Update to History and Physical  No marked change in status since office pre-op visit.   Patient examined.   OK to proceed with surgery. 

## 2016-06-11 NOTE — Anesthesia Postprocedure Evaluation (Signed)
Anesthesia Post Note  Patient: Emily Mathis  Procedure(s) Performed: Procedure(s) (LRB): ROBOTIC ASSISTED TOTAL HYSTERECTOMY WITH SALPINGECTOMY (Bilateral) CYSTOSCOPY (N/A)  Patient location during evaluation: [facility] Anesthesia Type: General Level of consciousness: awake and alert Pain management: pain level controlled Vital Signs Assessment: post-procedure vital signs reviewed and stable Respiratory status: spontaneous breathing and patient connected to nasal cannula oxygen Cardiovascular status: stable Postop Assessment: no signs of nausea or vomiting Anesthetic complications: no     Last Vitals:  Vitals:   06/11/16 1443 06/11/16 1547  BP: (!) 99/40 (!) 124/57  Pulse: (!) 102 100  Resp: 16 16  Temp: 36.9 C 36.6 C    Last Pain:  Vitals:   06/11/16 1547  TempSrc: Oral  PainSc:    Pain Goal: Patients Stated Pain Goal: 4 (06/11/16 1443)               Hydeia Mcatee Hristova

## 2016-06-11 NOTE — Progress Notes (Signed)
Day of Surgery Procedure(s) (LRB): ROBOTIC ASSISTED TOTAL HYSTERECTOMY WITH SALPINGECTOMY (Bilateral) CYSTOSCOPY (N/A)  Subjective: Patient reports some right upper quadrant gas pain.  Denies nausea.  Voided.   Objective: I have reviewed patient's vital signs, intake and output and labs. Vitals:   06/11/16 1443 06/11/16 1547  BP: (!) 99/40 (!) 124/57  Pulse: (!) 102 100  Resp: 16 16  Temp: 98.5 F (36.9 C) 97.9 F (36.6 C)  I/O - 1700 cc/375 cc UO.   Glucose - 140s - 160s.  WBC - 11.2 HGB - 9.4 Platelets - 526,000  General: sleepy but appropriate. Resp: clear to auscultation bilaterally Cardio: S1S2 tachy.  No murmur, rub, or gallop. GI: soft, non-tender; bowel sounds normal; no masses,  no organomegaly and incision: clean, dry and intact Extremities: Ted hose and PAS on.  DPs 1+ bilaterally.  Vaginal Bleeding: minimal  Assessment: s/p Procedure(s): ROBOTIC ASSISTED TOTAL HYSTERECTOMY WITH SALPINGECTOMY (Bilateral) CYSTOSCOPY (N/A): stable  Tachycardia.  Status post bowel prep.  Plan: Discontinue IV fluids Will give LR bolus of 500 cc over 2 hours.   Morphine IV and Toradol IV prn.  Clear liquids. CBC and BMP in am.  Monitor FSBG every 6 hours and call for level over 200 or less than 60.  Lovenox in the am.  Surgical care and procedure reviewed with patient.    LOS: 1 day    Arloa Koh 06/11/2016, 6:20 PM

## 2016-06-11 NOTE — Op Note (Signed)
OPERATIVE REPORT   PREOPERATIVE DIAGNOSIS:   Menorrhagia with irregular menses, uterine fibroids, anemia.   POSTOPERATIVE DIAGNOSIS:   Menorrhagia with irregular menses, uterine fibroids, anemia.  PROCEDURES: Da Vinci robotic total laparoscopic hysterectomy with bilateral salpingectomy, cystoscopy  SURGEON: Lenard Galloway, M.D.  ASSISTANT: Lyman Speller, M.D.  ANESTHESIA: General endotracheal, intraperitoneal ropivicaine 30 mL diluted in 30 mL of normal saline, local with 0.25% Marcaine.  IV FLUIDS:   1200 cc LR.  ESTIMATED BLOOD LOSS:  150 cc.  URINE OUTPUT: 150 cc.  COMPLICATIONS: None.  INDICATIONS FOR THE PROCEDURE:    The patient is a 50 year old P2 African American female who presents with heavy and irregular menses and uterine fibroids.  The patient has been transfused one unit of packed red blood cells for anemia.  She is status post hysteroscopy with dilation and curettage, with a final pathology report showing a benign endometrial polyp.  An attempt was made to place a Mirena IUD in the office under ultrasound guidance, and this was not successful.  A plan is now made to proceed with a robotic total laparoscopic hysterectomy with bilateral salpingectomy and cystoscopy after risks, benefits, and alternatives are reviewed.  FINDINGS:    The uterus demonstrated 2 intramural fibroid, one anterior lower uterine segment, and one left posterior lower uterine segment.  The bilateral ovaries and fallopian tubes were normal.   The upper abdomen demonstrated a normal liver.    There were minimal adhesions of the sigmoid colon to the left pelvic side wall.  There were no signs of endometriosis in the abdomen or pelvis.  Cystoscopy at the termination of the procedure showed the bladder to be normal throughout 360 degrees including the bladder dome and trigone. There was no evidence of any foreign body in the bladder or the urethra. There was no evidence of any  lesions of the bladder or the urethra.  Both of the ureters were noted to be patent bilaterally.  SPECIMENS:    The uterus, cervix, and bilateral tubes were went to pathology.  The specimen weight was 273.3 grams.   DESCRIPTION OF PROCEDURE:   The patient was reidentified in the preoperative hold area.  She did receive Cefotetan IV for antibiotic prophylaxis. She received TED hose and PAS stockings for DVT prophylaxis.  In the operating room, the patient was placed in the dorsal lithotomy position on the operating room table. Trendguard was used to support the patient on the OR table. Her legs were placed in the Kenneth stirrups and her arms were both tucked at her sides with the appropriate padded arm protectors. The patient received general endotracheal anesthesia and she then received proper padding around her head and neck.  The abdomen and vagina were then sterilely prepped and she was sterilely draped.  A speculum was placed in the vagina and a single-tooth tenaculum was placed on the anterior cervical lip. A figure-of-eight suture of 0 Vicryl was placed on each the anterior and the posterior cervical lips. The uterus was sounded to 9 cm. The cervix was then dilated with [facility] dilators. A #  __10____ RUMI tip with a KOH ring was then placed through the cervix and into the uterine cavity without difficulty. The remaining vaginal instruments were then removed. A Foley catheter was placed inside the bladder.  Attention was turned to the abdomen where the infraumbilical region was injected with 0.25% Marcaine and a small incision created.  A Veress needle was then used to insufflate the abdomen with  CO2 gas after a saline drop test was performed and the fluid flowed freely.  A 12 mm supraumbilical incision was created with a scalpel after the skin was injected locally with 0.25% Marcaine. A 12 mm robotic camera port was then placed through this umbilical incision. 8 mm incisions were  then created to the right and left of the supraumbilical incision after the skin was injected locally with 0.25% Marcaine.  The 8 mm trocars were then placed under visualization of the laparoscope.  An 8 mm assistant port was placed in the right lower quadrant after local injection with 0.25% Marcaine.  The laparoscope was used during this placement.  Ropivicine was placed inside the peritoneal cavity.   At this time, the patient was placed in Trendelenburg position. An inspection of the abdomen and pelvis was performed. The findings are as noted above.   The robot was docked on the patient's left-hand side. The monopolar laparoscopic scissors was placed in robotic port #1 and the PK instrument was placed through robotic port site #2.  At this time, I stepped to the surgeon's console while my assistant and the rest of the surgical team remained at the patient's side.  The left fallopian tube was elevated and the mesosalpinx was cauterized with a PK instrument and then sharply divided with the laparoscopic scissors. The proximal fallopian tube was cauterized and bisected with the PK instrument and then removed from the peritoneal cavity.  Dissection then continued through the broad ligament using again the PK instrument for cautery and the monopolar scissors for cutting. The left round ligament was then cauterized with the PK instrument and sharply divided with the laparoscopic scissors. The same technique was used to cauterize and cut the utero-ovarian ligament on this side.  Dissection was performed to the anterior and posterior leaves of the broad ligaments again using the monopolar laparoscopic scissors. The incision was carried across the anterior cul- de-sac along the vesicouterine fold and the bladder was dissected away from the cervix using sharp dissection with the laparoscopic scissors. There were adhesions from the patient's prior Cesarean Sections, and this dissection took approximately  one hour of surgical time. The peritoneum was taken down posteriorly. The left uterine artery was skeletonized at this time using sharp dissection and monopolar cautery.   It was then cauterized with the PK instrument and was then cut using the laparoscopic scissors.    Attention was turned to the patient's right-hand side at this time. Along the right pelvic sidewall, the ureter was identified. The right fallopian tube was elevated, and the mesosalpinx was cauterized with the PK instrument and then bisected with the laparoscopic monopolar scissors. Dissection continued through the right broad ligament using the monopolar scissors for cautery and cutting. The proximal fallopian tube was cauterized and cut.  The specimen was removed from the peritoneal cavity.  The right utero-ovarian ligament was similarly cauterized and cut.  The right round ligament was then cauterized with the PK instrument and sharply divided with the laparoscopic monopolar scissors. Again, the anterior and posterior leaves of the broad ligament were taken down using monopolar laparoscopic scissors for dissection. The bladder flap was taken down anteriorly using the sharp dissection with the scissors. hemostasis during the dissection was created with monopolar energy.  The uterine artery was skeletonized along the patient's right hand side and was then cauterized with the PK instrument and was divided with the laparoscopic scissors.    The KOH ring was nicely visible. The colpotomy incision was performed  with the laparoscopic monopolar scissors in a circumferential fashion. The specimen was then removed from the peritoneal cavity and was sent to Pathology. The balloon occluder was placed in the vagina. The vaginal cuff was sutured at this time using a running suture of 0 V-Loc.The was performed with two sutures.  One suture began at the right vaginal apex and was brought to the midline.  The needle separated from the suture at  its attachment point, and the needle was parked in the anterior peritoneum.  The remained of the vagina was closed from the patient's left hand side to the right side and going two throws past the previously closed vagina. This provided good full-thickness closure of the vaginal cuff.  Hemostasis was good.   The pelvis was irrigated and suctioned.  There was good hemostasis of the operative sites and pedicles.  The laparoscopic needle with the attached suture was removed from the peritoneal cavity.  The extra suture from the other V-Loc was removed from the peritoneal cavity as well.   Final inspection of all operative sites demonstrated good hemostasis after the pneumoperitoneum was partially released.   Arista was placed over the surgical field.   The robotic laparoscopic instruments were removed.  The robot was undocked.    An 8-mm laparoscope was placed at this time and the second V-Lok needle  needle was retrieved from inside the peritoneal cavity. The [employer] closure was used to close the supraumbilical incision using a 0 Vicryl suture.  This did not adequately close the incision, so a single suture of 0 Vicryl was placed directly to close the peritoneum.  The sutures were then tied together.  The fascia was closed.  The laparoscopic trocars were removed under visualization of the laparoscope. The CO2 pneumoperitoneum was released. The patient received manual breaths to remove any remaining CO2 gas and then the umbilical trocar was removed.  The patient's 3-way Foley catheter was removed at this time and cystoscopy was performed and the findings are as noted above. The Foley catheter was left out.   Final inspection of the vagina demonstrated good hemostasis of the vaginal cuff.  All skin incisions were closed with subcuticular sutures of 4-0 Vicryl. Dermabond was placed over the incisions and the umbilical incision was closed with a honeycomb dressing.  This concluded the  patient's procedure. She was extubated and escorted to the recovery room in stable and awake condition. There were no complications to the procedure. All needle, instrument, and sponge counts were correct.     Lenard Galloway, M.D.

## 2016-06-11 NOTE — Anesthesia Preprocedure Evaluation (Addendum)
Anesthesia Evaluation  Patient identified by MRN, date of birth, ID band Patient awake    Reviewed: Allergy & Precautions, NPO status , Patient's Chart, lab work & pertinent test results  Airway Mallampati: II       Dental no notable dental hx. (+) Chipped,    Pulmonary shortness of breath and with exertion, asthma ,    Pulmonary exam normal breath sounds clear to auscultation       Cardiovascular hypertension, Pt. on medications negative cardio ROS Normal cardiovascular exam Rhythm:Regular Rate:Normal     Neuro/Psych  Headaches, PSYCHIATRIC DISORDERS Anxiety  Neuromuscular disease    GI/Hepatic Neg liver ROS, GERD  Medicated and Controlled,  Endo/Other  diabetes, Well Controlled, Type 2, Oral Hypoglycemic AgentsHypothyroidism Morbid obesityHyoperlipidemia  Renal/GU negative Renal ROS  negative genitourinary   Musculoskeletal  (+) Arthritis , Osteoarthritis,    Abdominal (+) + obese,   Peds  Hematology  (+) anemia ,   Anesthesia Other Findings   Reproductive/Obstetrics Leiomyoma uterus Menorrhagia                            Anesthesia Physical Anesthesia Plan  ASA: III  Anesthesia Plan: General   Post-op Pain Management:    Induction: Intravenous  Airway Management Planned: Oral ETT  Additional Equipment:   Intra-op Plan:   Post-operative Plan: Extubation in OR  Informed Consent: I have reviewed the patients History and Physical, chart, labs and discussed the procedure including the risks, benefits and alternatives for the proposed anesthesia with the patient or authorized representative who has indicated his/her understanding and acceptance.   Dental advisory given  Plan Discussed with: Anesthesiologist, CRNA and Surgeon  Anesthesia Plan Comments:         Anesthesia Quick Evaluation

## 2016-06-11 NOTE — Brief Op Note (Signed)
06/11/2016  11:29 AM  PATIENT:  Emily Mathis  50 y.o. female  PRE-OPERATIVE DIAGNOSIS:  fibroids, menorrhagia with irregular cycle, anemia  POST-OPERATIVE DIAGNOSIS:  fibroids, menorrhagia with irregular cycle, anemia  PROCEDURE:  Procedure(s): ROBOTIC ASSISTED TOTAL HYSTERECTOMY WITH SALPINGECTOMY (Bilateral) CYSTOSCOPY (N/A)  SURGEON:  Surgeon(s) and Role:    * Elonna Mcfarlane E Yisroel Ramming, MD - Primary    * Megan Salon, MD - Assisting  PHYSICIAN ASSISTANT:  NA   ASSISTANTS:  Megan Salon, MD   ANESTHESIA:   local, general and Intraperitoneal Ropivicaine 30 cc in 30 cc NS  EBL:  Total I/O In: 1200 [I.V.:1200] Out: 300 [Urine:150; Blood:150]  BLOOD ADMINISTERED:none  DRAINS: none   LOCAL MEDICATIONS USED:  MARCAINE     SPECIMEN:  Source of Specimen:  Uterus, cervix, bilateral tubes.  DISPOSITION OF SPECIMEN:  PATHOLOGY  COUNTS:  YES  TOURNIQUET:  * No tourniquets in log *  DICTATION: .Note written in [place]: Admit for overnight observation  PATIENT DISPOSITION:  PACU - hemodynamically stable.   Delay start of Pharmacological VTE agent (>24hrs) due to surgical blood loss or risk of bleeding: no

## 2016-06-11 NOTE — Anesthesia Procedure Notes (Signed)
Procedure Name: Intubation Date/Time: 06/11/2016 7:33 AM Performed by: Raenette Rover Pre-anesthesia Checklist: Patient identified, Emergency Drugs available, Suction available and Patient being monitored Patient Re-evaluated:Patient Re-evaluated prior to inductionOxygen Delivery Method: Circle system utilized Preoxygenation: Pre-oxygenation with 100% oxygen Intubation Type: IV induction Ventilation: Mask ventilation without difficulty Laryngoscope Size: Mac and 3 Grade View: Grade I Tube type: Oral Tube size: 7.0 mm Number of attempts: 1 Airway Equipment and Method: Patient positioned with wedge pillow and Stylet Placement Confirmation: breath sounds checked- equal and bilateral,  ETT inserted through vocal cords under direct vision,  positive ETCO2 and CO2 detector Secured at: 21 cm Tube secured with: Tape Dental Injury: Teeth and Oropharynx as per pre-operative assessment

## 2016-06-11 NOTE — Transfer of Care (Signed)
Immediate Anesthesia Transfer of Care Note  Patient: Emily Mathis  Procedure(s) Performed: Procedure(s): ROBOTIC ASSISTED TOTAL HYSTERECTOMY WITH SALPINGECTOMY (Bilateral) CYSTOSCOPY (N/A)  Patient Location: PACU  Anesthesia Type:General  Level of Consciousness: awake, alert , oriented and patient cooperative  Airway & Oxygen Therapy: Patient Spontanous Breathing and Patient connected to nasal cannula oxygen  Post-op Assessment: Report given to RN and Post -op Vital signs reviewed and stable  Post vital signs: Reviewed and stable  Last Vitals:  Vitals:   06/11/16 0636  BP: 129/80  Pulse: 93  Resp: (!) 22  Temp: 36.4 C    Last Pain:  Vitals:   06/11/16 0636  TempSrc: Oral      Patients Stated Pain Goal: 4 ([id number])  Complications: No apparent anesthesia complications

## 2016-06-12 ENCOUNTER — Encounter (HOSPITAL_COMMUNITY): Payer: Self-pay | Admitting: Obstetrics and Gynecology

## 2016-06-12 DIAGNOSIS — J45909 Unspecified asthma, uncomplicated: Secondary | ICD-10-CM | POA: Diagnosis not present

## 2016-06-12 DIAGNOSIS — E039 Hypothyroidism, unspecified: Secondary | ICD-10-CM | POA: Diagnosis not present

## 2016-06-12 DIAGNOSIS — Z90710 Acquired absence of both cervix and uterus: Secondary | ICD-10-CM | POA: Diagnosis not present

## 2016-06-12 DIAGNOSIS — I10 Essential (primary) hypertension: Secondary | ICD-10-CM | POA: Diagnosis not present

## 2016-06-12 LAB — CBC
HCT: 28.3 % — ABNORMAL LOW (ref 36.0–46.0)
HEMATOCRIT: 26.3 % — AB (ref 36.0–46.0)
HEMOGLOBIN: 7.9 g/dL — AB (ref 12.0–15.0)
Hemoglobin: 8.6 g/dL — ABNORMAL LOW (ref 12.0–15.0)
MCH: 23 pg — ABNORMAL LOW (ref 26.0–34.0)
MCH: 23.3 pg — ABNORMAL LOW (ref 26.0–34.0)
MCHC: 30 g/dL (ref 30.0–36.0)
MCHC: 30.4 g/dL (ref 30.0–36.0)
MCV: 76.5 fL — ABNORMAL LOW (ref 78.0–100.0)
MCV: 76.7 fL — AB (ref 78.0–100.0)
PLATELETS: 478 10*3/uL — AB (ref 150–400)
Platelets: 474 10*3/uL — ABNORMAL HIGH (ref 150–400)
RBC: 3.44 MIL/uL — ABNORMAL LOW (ref 3.87–5.11)
RBC: 3.69 MIL/uL — AB (ref 3.87–5.11)
RDW: 18.9 % — AB (ref 11.5–15.5)
RDW: 18.7 % — ABNORMAL HIGH (ref 11.5–15.5)
WBC: 9.8 10*3/uL (ref 4.0–10.5)
WBC: 9 10*3/uL (ref 4.0–10.5)

## 2016-06-12 LAB — BASIC METABOLIC PANEL
ANION GAP: 5 (ref 5–15)
ANION GAP: 7 (ref 5–15)
BUN: 17 mg/dL (ref 6–20)
BUN: 19 mg/dL (ref 6–20)
CALCIUM: 7.7 mg/dL — AB (ref 8.9–10.3)
CHLORIDE: 101 mmol/L (ref 101–111)
CO2: 32 mmol/L (ref 22–32)
CO2: 29 mmol/L (ref 22–32)
Calcium: 7.7 mg/dL — ABNORMAL LOW (ref 8.9–10.3)
Chloride: 101 mmol/L (ref 101–111)
Creatinine, Ser: 0.66 mg/dL (ref 0.44–1.00)
Creatinine, Ser: 0.83 mg/dL (ref 0.44–1.00)
GFR calc Af Amer: >60 mL/min (ref >60)
GFR calc non Af Amer: >60 mL/min (ref >60)
GFR calc non Af Amer: >60 mL/min (ref >60)
GLUCOSE: 133 mg/dL — AB (ref 65–99)
Glucose, Bld: 102 mg/dL — ABNORMAL HIGH (ref 65–99)
POTASSIUM: 2.9 mmol/L — AB (ref 3.5–5.1)
Potassium: 2.9 mmol/L — ABNORMAL LOW (ref 3.5–5.1)
Sodium: 138 mmol/L (ref 135–145)
Sodium: 137 mmol/L (ref 135–145)

## 2016-06-12 LAB — GLUCOSE, CAPILLARY
GLUCOSE-CAPILLARY: 110 mg/dL — AB (ref 65–99)
Glucose-Capillary: 149 mg/dL — ABNORMAL HIGH (ref 65–99)

## 2016-06-12 MED ORDER — POTASSIUM CHLORIDE CRYS ER 20 MEQ PO TBCR: 20.0000 meq | EXTENDED_RELEASE_TABLET | Freq: Two times a day (BID) | ORAL | 0 refills | Status: AC

## 2016-06-12 MED ORDER — OXYCODONE-ACETAMINOPHEN 5-325 MG PO TABS: 1.0000 | ORAL_TABLET | ORAL | 0 refills | Status: DC | PRN

## 2016-06-12 MED ORDER — POTASSIUM CHLORIDE CRYS ER 20 MEQ PO TBCR
20.0000 meq | EXTENDED_RELEASE_TABLET | Freq: Two times a day (BID) | ORAL | Status: DC
  Administered 2016-06-12: 20 meq via ORAL
  Filled 2016-06-12 (×3): qty 1

## 2016-06-12 MED ORDER — POTASSIUM CHLORIDE 20 MEQ PO PACK
20.0000 meq | PACK | Freq: Two times a day (BID) | ORAL | Status: DC
  Filled 2016-06-12: qty 1

## 2016-06-12 NOTE — Progress Notes (Signed)
Discharge teaching complete with patient. Pt understood all information and did not have any questions. Pt ambulated out of the hospital and discharged home to family.

## 2016-06-12 NOTE — Discharge Instructions (Signed)
Total Laparoscopic Hysterectomy, Care After °Refer to this sheet in the next few weeks. These instructions provide you with information on caring for yourself after your procedure. Your health care provider may also give you more specific instructions. Your treatment has been planned according to current medical practices, but problems sometimes occur. Call your health care provider if you have any problems or questions after your procedure. °WHAT TO EXPECT AFTER THE PROCEDURE °· Pain and bruising at the incision sites. You will be given pain medicine to control it. °· Menopausal symptoms such as hot flashes, night sweats, and insomnia if your ovaries were removed. °· Sore throat from the breathing tube that was inserted during surgery. °HOME CARE INSTRUCTIONS °· Only take over-the-counter or prescription medicines for pain, discomfort, or fever as directed by your health care provider.   °· Do not take aspirin. It can cause bleeding.   °· Do not drive when taking pain medicine.   °· Follow your health care provider's advice regarding diet, exercise, lifting, driving, and general activities.   °· Resume your usual diet as directed and allowed.   °· Get plenty of rest and sleep.   °· Do not douche, use tampons, or have sexual intercourse for at least 6 weeks, or until your health care provider gives you permission.   °· Change your bandages (dressings) as directed by your health care provider.   °· Monitor your temperature and notify your health care provider of a fever.   °· Take showers instead of baths for 2-3 weeks.   °· Do not drink alcohol until your health care provider gives you permission.   °· If you develop constipation, you may take a mild laxative with your health care provider's permission. Bran foods may help with constipation problems. Drinking enough fluids to keep your urine clear or pale yellow may help as well.   °· Try to have someone home with you for 1-2 weeks to help around the house.    °· Keep all of your follow-up appointments as directed by your health care provider.   °SEEK MEDICAL CARE IF: °· You have swelling, redness, or increasing pain around your incision sites.   °· You have pus coming from your incision.   °· You notice a bad smell coming from your incision.   °· Your incision breaks open.   °· You feel dizzy or lightheaded.   °· You have pain or bleeding when you urinate.   °· You have persistent diarrhea.   °· You have persistent nausea and vomiting.   °· You have abnormal vaginal discharge.   °· You have a rash.   °· You have any type of abnormal reaction or develop an allergy to your medicine.   °· You have poor pain control with your prescribed medicine.   °SEEK IMMEDIATE MEDICAL CARE IF: °· You have chest pain or shortness of breath. °· You have severe abdominal pain that is not relieved with pain medicine. °· You have pain or swelling in your legs. °MAKE SURE YOU: °· Understand these instructions. °· Will watch your condition. °· Will get help right away if you are not doing well or get worse. °  °This information is not intended to replace advice given to you by your health care provider. Make sure you discuss any questions you have with your health care provider. °  °Document Released: 06/24/2013 Document Revised: 09/08/2013 Document Reviewed: 06/24/2013 °Elsevier Interactive Patient Education ©2016 [employer] ° °

## 2016-06-12 NOTE — Progress Notes (Signed)
1 Day Post-Op Procedure(s) (LRB): ROBOTIC ASSISTED TOTAL HYSTERECTOMY WITH SALPINGECTOMY (Bilateral) CYSTOSCOPY (N/A)  Subjective: Patient reports no problems voiding.   Ambulated last hs.  Hungry.  Good pain control on IV meds.  Objective: I have reviewed patient's vital signs, intake and output and labs. Vitals:   06/12/16 0140 06/12/16 0559  BP: (!) 110/52 110/61  Pulse: 77 74  Resp: 18 20  Temp: 97.7 F (36.5 C) 98 F (36.7 C)   I/O - 2060 cc/900 cc. FSBG 1102 - 120s.  General: cooperative and looks tired. Appropriate conversation.  Resp: clear to auscultation bilaterally Cardio: regular rate and rhythm, S1, S2 normal, no murmur, click, rub or gallop GI: soft, non-tender; bowel sounds normal; no masses,  no organomegaly and incision: clean, dry and intact Vaginal Bleeding: minimal  Assessment: s/p Procedure(s): ROBOTIC ASSISTED TOTAL HYSTERECTOMY WITH SALPINGECTOMY (Bilateral) CYSTOSCOPY (N/A): progressing well Tachycardia resolved with IVF bolus.  FSBG in reasonable control.   Plan: Advance diet Encourage ambulation Advance to PO medication Discharge home  Surgical findings and procedure reviewed again.  Discharge instructions reviewed in verbal and written form.  Rx for Percocet and Motrin.  Continue Fe po tid.  Follow up in office in 5 days.   LOS: 1 day    [employer] 06/12/2016, 7:00 AM

## 2016-06-12 NOTE — Anesthesia Postprocedure Evaluation (Signed)
Anesthesia Post Note  Patient: Emily Mathis  Procedure(s) Performed: Procedure(s) (LRB): ROBOTIC ASSISTED TOTAL HYSTERECTOMY WITH SALPINGECTOMY (Bilateral) CYSTOSCOPY (N/A)  Patient location during evaluation: PACU Anesthesia Type: General Level of consciousness: awake and alert and oriented Pain management: pain level controlled Vital Signs Assessment: post-procedure vital signs reviewed and stable Respiratory status: spontaneous breathing, nonlabored ventilation, respiratory function stable and patient connected to nasal cannula oxygen Cardiovascular status: blood pressure returned to baseline and stable Postop Assessment: no signs of nausea or vomiting Anesthetic complications: no                 Dewitt Judice A.

## 2016-06-18 ENCOUNTER — Encounter: Payer: Self-pay | Admitting: Obstetrics and Gynecology

## 2016-06-18 ENCOUNTER — Ambulatory Visit (INDEPENDENT_AMBULATORY_CARE_PROVIDER_SITE_OTHER): Payer: BLUE CROSS/BLUE SHIELD | Admitting: Obstetrics and Gynecology

## 2016-06-18 VITALS — BP 134/64 | HR 90 | Ht 63.0 in | Wt 261.0 lb

## 2016-06-18 DIAGNOSIS — D500 Iron deficiency anemia secondary to blood loss (chronic): Secondary | ICD-10-CM

## 2016-06-18 DIAGNOSIS — Z9889 Other specified postprocedural states: Secondary | ICD-10-CM

## 2016-06-18 DIAGNOSIS — R102 Pelvic and perineal pain unspecified side: Secondary | ICD-10-CM

## 2016-06-18 DIAGNOSIS — E876 Hypokalemia: Secondary | ICD-10-CM | POA: Diagnosis not present

## 2016-06-18 DIAGNOSIS — R319 Hematuria, unspecified: Secondary | ICD-10-CM

## 2016-06-18 LAB — BASIC METABOLIC PANEL
BUN: 14 mg/dL (ref 7–25)
CHLORIDE: 102 mmol/L (ref 98–110)
CO2: 29 mmol/L (ref 20–31)
Calcium: 8.5 mg/dL — ABNORMAL LOW (ref 8.6–10.2)
Creat: 0.79 mg/dL (ref 0.50–1.10)
Glucose, Bld: 114 mg/dL — ABNORMAL HIGH (ref 65–99)
POTASSIUM: 3.7 mmol/L (ref 3.5–5.3)
SODIUM: 140 mmol/L (ref 135–146)

## 2016-06-18 LAB — POCT URINALYSIS DIPSTICK
BILIRUBIN UA: NEGATIVE
GLUCOSE UA: NEGATIVE
Ketones, UA: NEGATIVE
NITRITE UA: NEGATIVE
PH UA: 8
Protein, UA: NEGATIVE
UROBILINOGEN UA: NEGATIVE

## 2016-06-18 LAB — CBC
HCT: 31.2 % — ABNORMAL LOW (ref 35.0–45.0)
Hemoglobin: 9.4 g/dL — ABNORMAL LOW (ref 11.7–15.5)
MCH: 22.5 pg — ABNORMAL LOW (ref 27.0–33.0)
MCHC: 30.1 g/dL — ABNORMAL LOW (ref 32.0–36.0)
MCV: 74.6 fL — ABNORMAL LOW (ref 80.0–100.0)
MPV: 9.6 fL (ref 7.5–12.5)
PLATELETS: 615 10*3/uL — AB (ref 140–400)
RBC: 4.18 MIL/uL (ref 3.80–5.10)
RDW: 18.7 % — ABNORMAL HIGH (ref 11.0–15.0)
WBC: 8.6 10*3/uL (ref 3.8–10.8)

## 2016-06-18 NOTE — Progress Notes (Signed)
GYNECOLOGY  VISIT   HPI: 50 y.o.   Single  African American  female   [id number] with Patient's last menstrual period was 05/08/2016 (exact date).   here for 1 week follow up ROBOTIC ASSISTED TOTAL HYSTERECTOMY WITH SALPINGECTOMY (Bilateral Abdomen) CYSTOSCOPY (N/A Bladder). Final pathology - benign cervix, benign polyp of endometrium, benign fibroids, benign paratubal cyst.   Daughter and grandson are here today for the visit.   Patient is being tx for hypokalemia and anemia.  K 2.9 on 06/12/16.  On KCl daily. Hgb 8.6 on 06/12/16.  On iron tid.   Feeling some lower pubic tenderness.  Feels crampy gas pain.  Incisions are stinging.  Patient is taking Percocet and Motrin.  Doing chores at home.   Eating ok. Having BMs.   Some pressure with urination.  No dysuria.   No vaginal bleeding.   GYNECOLOGIC HISTORY: Patient's last menstrual period was 05/08/2016 (exact date). Contraception:  Hysterectomy Menopausal hormone therapy:  none Last mammogram:   02/29/16 Density B/Neg/BIRADS1: [facility] Last pap smear:  03/12/16 Neg, Neg HR HPV         OB History    Gravida Para Term Preterm AB Living   3 3 3          SAB TAB Ectopic Multiple Live Births                     Patient Active Problem List   Diagnosis Date Noted  . Status post laparoscopic hysterectomy 06/11/2016  . Iron deficiency anemia 02/15/2016  . Myalgia 02/15/2016  . Upper airway cough syndrome 10/21/2015  . Polyarthralgia 06/16/2015  . Diabetes mellitus type 2, uncomplicated ([place]) [id number]  . Keloid scar, cervical incision 09/02/2013  . Reaction, situational, acute, to stress 07/02/2013  . 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 Severe obesity (BMI >= 40) ([place]) 03/27/2013  . GERD (gastroesophageal reflux disease) 02/15/2013  . Hypocalcemia 01/07/2013  . Chest pain on exertion 07/24/2011    Past Medical History:  Diagnosis Date  . Abnormal Pap smear of cervix 1990   Hx cryotherapy to cervix  .  Abnormal uterine bleeding   . Anemia 02/20/16   Transfusion of 1 unit pRBCs  . Anxiety   . Arthritis   . Asthma   . 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 Chest pain    per pt due to anemia-   . Diabetes mellitus without complication ([place])   . Fibroid   . Generalized pain   . GERD (gastroesophageal reflux disease)   . Headache(784.0)   . History of blood transfusion 02/20/2016  . Hyperlipidemia   . Hypertension    per Dr. Irven Shelling note 08/30/2011  . Hypothyroidism   . Leg swelling    bilateral  . Neuromuscular disorder ([place])   . Shortness of breath dyspnea    on exertion due to anemia  . Thyroid disease    Hx 3 goiters--hx thyroidectomy--sees Dr. Forde Dandy    Past Surgical History:  Procedure Laterality Date  . CARDIAC CATHETERIZATION  2013  . CESAREAN SECTION  1995  . CESAREAN SECTION  1996  . COLONOSCOPY    . CYSTOSCOPY N/A 06/11/2016   Procedure: CYSTOSCOPY;  Surgeon: [name], MD;  Location: [facility];  Service: Gynecology;  Laterality: N/A;  . HYSTEROSCOPY W/D&C N/A 04/10/2016   Procedure: HYSTEROSCOPY WITH fractional dilatation and curretage;  Surgeon: [name], MD;  Location: [facility];  Service: Gynecology;  Laterality: N/A;  .  LEFT HEART CATHETERIZATION WITH CORONARY ANGIOGRAM  07/24/2011   Procedure: LEFT HEART CATHETERIZATION WITH CORONARY ANGIOGRAM;  Surgeon: Laverda Page, MD;  Location: [facility];  Service: Cardiovascular;;  . ROBOTIC ASSISTED TOTAL HYSTERECTOMY WITH SALPINGECTOMY Bilateral 06/11/2016   Procedure: ROBOTIC ASSISTED TOTAL HYSTERECTOMY WITH SALPINGECTOMY;  Surgeon: [name], MD;  Location: [facility];  Service: Gynecology;  Laterality: Bilateral;  . THYROIDECTOMY N/A 12/18/2012   Procedure: TOTAL THYROIDECTOMY;  Surgeon: Earnstine Regal, MD;  Location: [facility];  Service: General;  Laterality: N/A;  . UPPER GI ENDOSCOPY      Current Outpatient Prescriptions  Medication Sig Dispense Refill  . albuterol (PROVENTIL  HFA;VENTOLIN HFA) 108 (90 Base) MCG/ACT inhaler Inhale 2 puffs into the lungs daily as needed. Reported on 01/04/2016 18 g 1  . ALPRAZolam (XANAX) 0.5 MG tablet TAKE 1 TABLET BY MOUTH EVERY NIGHT AT BEDTIME AS NEEDED FOR SLEEP 30 tablet 0  . Canagliflozin-Metformin HCl (INVOKAMET) 150-500 MG TABS Take 1 tablet by mouth daily. 90 tablet 0  . chlorthalidone (HYGROTON) 25 MG tablet TAKE 1 TABLET(25 MG) BY MOUTH DAILY 90 tablet 1  . Cholecalciferol (VITAMIN D3) 50000 units CAPS Take 1 capsule by mouth once a week.  3  . diazepam (VALIUM) 5 MG tablet TAKE 1 TABLET BY MOUTH EVERY 6 HOURS AS NEEDED FOR MUSCLE SPASMS 30 tablet 0  . ferrous sulfate 325 (65 FE) MG tablet Take 325 mg by mouth 3 (three) times daily with meals.    Marland Kitchen ibuprofen (ADVIL,MOTRIN) 800 MG tablet Take 1 tablet (800 mg total) by mouth every 8 (eight) hours as needed. 30 tablet 0  . levothyroxine (SYNTHROID, LEVOTHROID) 300 MCG tablet Take 1 tablet (300 mcg total) by mouth daily. 90 tablet 3  . lisinopril (PRINIVIL,ZESTRIL) 2.5 MG tablet TAKE 1 TABLET BY MOUTH EVERY DAY 90 tablet 1  . nystatin (MYCOSTATIN) powder Apply topically 4 (four) times daily. (Patient taking differently: Apply topically 4 (four) times daily as needed (For heat rash.). ) 60 g 2  . oxyCODONE-acetaminophen (PERCOCET/ROXICET) 5-325 MG tablet Take 1-2 tablets by mouth every 4 (four) hours as needed for severe pain (moderate to severe pain (when tolerating fluids)). 30 tablet 0  . pantoprazole (PROTONIX) 40 MG tablet TAKE 1 TABLET BY MOUTH TWICE DAILY 180 tablet 1  . potassium chloride SA (K-DUR,KLOR-CON) 20 MEQ tablet Take 1 tablet (20 mEq total) by mouth 2 (two) times daily. 60 tablet 0  . sertraline (ZOLOFT) 100 MG tablet TAKE 1 TABLET BY MOUTH EVERY DAY 30 tablet 2  . sucralfate (CARAFATE) 1 G tablet Take 1 tablet (1 g total) by mouth 4 (four) times daily. 30 tablet 0   No current facility-administered medications for this visit.      ALLERGIES: Latex; Dilaudid  [hydromorphone hcl]; and Hydrocodone  Family History  Problem Relation Age of Onset  . Heart disease Mother   . Stroke Mother   . Hypertension Mother   . Heart disease Father   . Colon cancer Father   . Hypertension Father   . Diabetes Father   . Cancer Paternal Grandmother   . Asthma Daughter   . Seizures Daughter   . Hypertension Sister   . Thyroid disease Sister   . Mental illness Daughter 75    suicide attempt 06/2013  . Asthma Sister   . Hypertension Sister   . Breast cancer Paternal Aunt   . Cancer Paternal Aunt     Social History   Social History  .  Marital status: Single    Spouse name: n/a  . Number of children: 4  . Years of education: N/A   Occupational History  . [employer]  . receiving (returns)     [employer]   Social History Main Topics  . Smoking status: Never Smoker  . Smokeless tobacco: Never Used  . Alcohol use 0.0 oz/week     Comment: Occasional glass of wine  . Drug use: No  . Sexual activity: Yes    Partners: Male    Birth control/ protection: Abstinence, Surgical     Comment: Hyst   Other Topics Concern  . Not on file   Social History Narrative   Lives with the youngest of her 3 daughters.   Her oldest daughter lives in [place].   Her middle daughter lives independently, and is due with her first child 12/01/15.   Her son lives with his father, who moved out 04/2015, nearby.       ROS:  Pertinent items are noted in HPI.  PHYSICAL EXAMINATION:    BP 134/64 (BP Location: Right Arm, Patient Position: Sitting, Cuff Size: Large)   Pulse 90   Ht 5\' 3"  (1.6 m)   Wt 261 lb (118.4 kg)   LMP 05/08/2016 (Exact Date)   BMI 46.23 kg/m     General appearance: alert, cooperative and appears stated age   Abdomen: incisions intact, supra-umbilical incision is sore and without erythema or drainage.  Abdomen is soft, non-tender, no masses,  no organomegaly.  ASSESSMENT   Status post robotic TLH/bilateral  salpingectomy/cystoscopy.  Chronic anemia.  Hypokalemia.  Pelvic pressure.  No dysuria.  PLAN  Surgical findings, procedure, and surgical pathology reviewed with patient.  Urine dip and possible culture.  CBC and BMP.  Decrease activity.  I told her I think she may be pushing too hard.  Continue Motrin.  Follow up for 6 week post op visit.    An After Visit Summary was printed and given to the patient.

## 2016-06-19 LAB — URINALYSIS, MICROSCOPIC ONLY
Bacteria, UA: NONE SEEN [HPF]
CRYSTALS: NONE SEEN [HPF]
Casts: NONE SEEN [LPF]
WBC UA: NONE SEEN WBC/HPF (ref <=5)
YEAST: NONE SEEN [HPF]

## 2016-06-19 LAB — URINE CULTURE

## 2016-06-19 NOTE — Discharge Summary (Signed)
Physician Discharge Summary  Patient ID: Meritxell Valk MRN: [id number] DOB/AGE: 17-Jan-1966 50 y.o.  Admit date: 06/11/2016 Discharge date:  06/12/16  Admission Diagnoses: 1.  Menorrhagia with irregular menses. 2.  Fibroids.  3.  History of endometrial polyp. 4.  Diabetes mellitus.  5.  Anemia.  6.  Hypokalemia.   Discharge Diagnoses:  1.  Menorrhagia with irregular menses. 2.  Fibroids.  3.  History of endometrial polyp. 4.  Diabetes mellitus.  5.  Anemia.  6.  Hypokalemia. 7.  Status post robotic total laparoscopic hysterectomy with bilateral salpingectomy, cystoscopy.    Active Problems:   Status post laparoscopic hysterectomy   Discharged Condition: good  Hospital Course:  The patient was admitted on 06/11/16 for a robotic total laparoscopic hysterectomy with bilateral salpingectomy, cystoscopy.  which were performed without complication while under general anesthesia.  The patient's post op course was uneventful.  She had a morphine PCA and Toradol for pain control initially, and this was converted over to Percocet and Motrin on post op day one when the patient began taking po well.  She ambulated independently and wore PAS and received Lovenox for DVT prophylaxis.  Her foley catheter was left out at the termination of her surgery, and she voided well post op. The patient's vital signs were consistent with mild tachycardia post op, and she responded to a fluid challenge of LR 500 cc.  She demonstrated no signs of infection during her hospitalization.  The patient's post op day one Hgb was 7.9 at 7:04 am.  She was observed during the day, and had a repeat Hgb of 8.6 that afternoon at 15:26.  She was tolerating the this well.  Her potassium level was 2.9, and she received replacement for this.  Her blood sugar levels remained under 200 post op, and she did not receive any insulin coverage.  She had very minimal vaginal bleeding, and her incision(s) demonstrated no signs of erythema or  significant drainage.  She was found to be in good condition and ready for discharge on post op day one.   Consults: None  Significant Diagnostic Studies: labs:  See Hospital course.   Treatments: surgery:  robotic total laparoscopic hysterectomy with bilateral salpingectomy, cystoscopy.   Discharge Exam: Blood pressure (!) 124/50, pulse 93, temperature 97.9 F (36.6 C), temperature source Oral, resp. rate 20, height 5\' 3"  (1.6 m), weight 261 lb (118.4 kg), last menstrual period 05/08/2016, SpO2 96 %. General: cooperative and looks tired. Appropriate conversation.  Resp: clear to auscultation bilaterally Cardio: regular rate and rhythm, S1, S2 normal, no murmur, click, rub or gallop GI: soft, non-tender; bowel sounds normal; no masses,  no organomegaly and incision: clean, dry and intact Vaginal Bleeding: minimal  Disposition: 01-Home or Self Care  Discharge instructions were reviewed in verbal and written form.     Medication List    TAKE these medications   albuterol 108 (90 Base) MCG/ACT inhaler Commonly known as:  PROVENTIL HFA;VENTOLIN HFA Inhale 2 puffs into the lungs daily as needed. Reported on 01/04/2016   ALPRAZolam 0.5 MG tablet Commonly known as:  XANAX TAKE 1 TABLET BY MOUTH EVERY NIGHT AT BEDTIME AS NEEDED FOR SLEEP   Canagliflozin-Metformin HCl 150-500 MG Tabs Commonly known as:  INVOKAMET Take 1 tablet by mouth daily.   chlorthalidone 25 MG tablet Commonly known as:  HYGROTON TAKE 1 TABLET(25 MG) BY MOUTH DAILY   diazepam 5 MG tablet Commonly known as:  VALIUM TAKE 1 TABLET BY MOUTH EVERY 6 HOURS AS  NEEDED FOR MUSCLE SPASMS   ferrous sulfate 325 (65 FE) MG tablet Take 325 mg by mouth 3 (three) times daily with meals.   ibuprofen 800 MG tablet Commonly known as:  ADVIL,MOTRIN Take 1 tablet (800 mg total) by mouth every 8 (eight) hours as needed.   levothyroxine 300 MCG tablet Commonly known as:  SYNTHROID, LEVOTHROID Take 1 tablet (300 mcg total)  by mouth daily.   lisinopril 2.5 MG tablet Commonly known as:  PRINIVIL,ZESTRIL TAKE 1 TABLET BY MOUTH EVERY DAY   nystatin powder Commonly known as:  MYCOSTATIN/NYSTOP Apply topically 4 (four) times daily. What changed:  when to take this  reasons to take this   oxyCODONE-acetaminophen 5-325 MG tablet Commonly known as:  PERCOCET/ROXICET Take 1-2 tablets by mouth every 4 (four) hours as needed for severe pain (moderate to severe pain (when tolerating fluids)).   pantoprazole 40 MG tablet Commonly known as:  PROTONIX TAKE 1 TABLET BY MOUTH TWICE DAILY   potassium chloride SA 20 MEQ tablet Commonly known as:  K-DUR,KLOR-CON Take 1 tablet (20 mEq total) by mouth 2 (two) times daily.   sertraline 100 MG tablet Commonly known as:  ZOLOFT TAKE 1 TABLET BY MOUTH EVERY DAY   sucralfate 1 g tablet Commonly known as:  CARAFATE Take 1 tablet (1 g total) by mouth 4 (four) times daily.      Follow-up Information    Arloa Koh, MD On 06/18/2016.   Specialty:  Obstetrics and Gynecology Contact information: [address] [place] [address] [phone]         The patient will have a repeat CBC and BMP in the office at her appointment on 06/18/16.  Signed: Arloa Koh 06/19/2016, 6:46 AM

## 2016-06-20 ENCOUNTER — Other Ambulatory Visit: Payer: Self-pay | Admitting: Physician Assistant

## 2016-06-20 ENCOUNTER — Other Ambulatory Visit (HOSPITAL_COMMUNITY): Payer: Self-pay | Admitting: Obstetrics and Gynecology

## 2016-06-22 ENCOUNTER — Other Ambulatory Visit: Payer: Self-pay | Admitting: Obstetrics and Gynecology

## 2016-06-22 DIAGNOSIS — Z0271 Encounter for disability determination: Secondary | ICD-10-CM

## 2016-06-22 MED ORDER — IBUPROFEN 800 MG PO TABS: 800.0000 mg | ORAL_TABLET | Freq: Three times a day (TID) | ORAL | 0 refills | Status: DC | PRN

## 2016-06-28 NOTE — Telephone Encounter (Signed)
Last refill 03/2016 of both. Last ov 04/2016

## 2016-07-03 MED ORDER — DIAZEPAM 5 MG PO TABS: 5.0000 mg | ORAL_TABLET | Freq: Every day | ORAL | 0 refills | Status: DC

## 2016-07-03 MED ORDER — ALPRAZOLAM 0.5 MG PO TABS: 0.5000 mg | ORAL_TABLET | Freq: Every evening | ORAL | 0 refills | Status: DC | PRN

## 2016-07-03 NOTE — Telephone Encounter (Signed)
Please remind patient not to take these TOGETHER. Just ONE OR the OTHER.  Meds ordered this encounter  Medications  . ALPRAZolam (XANAX) 0.5 MG tablet    Sig: Take 1 tablet (0.5 mg total) by mouth at bedtime as needed. for sleep    Dispense:  30 tablet    Refill:  0  . diazepam (VALIUM) 5 MG tablet    Sig: Take 1 tablet (5 mg total) by mouth daily.    Dispense:  30 tablet    Refill:  0

## 2016-07-03 NOTE — Telephone Encounter (Signed)
Spoke to pt and reminded her to not take together. Pt agreed and stated she hadn't forgotten and will not take together!

## 2016-07-05 ENCOUNTER — Other Ambulatory Visit: Payer: Self-pay | Admitting: Obstetrics and Gynecology

## 2016-07-05 NOTE — Telephone Encounter (Signed)
Medication refill request: Potassium Chloride Last AEX:  06/18/16 Office Visit for Post-op Next AEX: 07/23/16 office visit  Last MMG (if hormonal medication request): 02/29/16 BIRADS1 negative Refill authorized: 06/12/16 #60 w/0 refills; today please advise

## 2016-07-11 ENCOUNTER — Other Ambulatory Visit: Payer: Self-pay | Admitting: Physician Assistant

## 2016-07-23 ENCOUNTER — Encounter: Payer: Self-pay | Admitting: Obstetrics and Gynecology

## 2016-07-23 ENCOUNTER — Ambulatory Visit (INDEPENDENT_AMBULATORY_CARE_PROVIDER_SITE_OTHER): Payer: BLUE CROSS/BLUE SHIELD | Admitting: Obstetrics and Gynecology

## 2016-07-23 VITALS — BP 122/74 | HR 100 | Ht 63.0 in | Wt 268.0 lb

## 2016-07-23 DIAGNOSIS — Z9889 Other specified postprocedural states: Secondary | ICD-10-CM

## 2016-07-23 DIAGNOSIS — R5381 Other malaise: Secondary | ICD-10-CM | POA: Diagnosis not present

## 2016-07-23 DIAGNOSIS — K5900 Constipation, unspecified: Secondary | ICD-10-CM

## 2016-07-23 LAB — CBC WITH DIFFERENTIAL/PLATELET
BASOS PCT: 1 %
Basophils Absolute: 110 cells/uL (ref 0–200)
EOS PCT: 2 %
Eosinophils Absolute: 220 cells/uL (ref 15–500)
HCT: 35.9 % (ref 35.0–45.0)
Hemoglobin: 10.7 g/dL — ABNORMAL LOW (ref 11.7–15.5)
LYMPHS PCT: 39 %
Lymphs Abs: 4290 cells/uL — ABNORMAL HIGH (ref 850–3900)
MCH: 23.5 pg — ABNORMAL LOW (ref 27.0–33.0)
MCHC: 29.8 g/dL — ABNORMAL LOW (ref 32.0–36.0)
MCV: 78.7 fL — ABNORMAL LOW (ref 80.0–100.0)
MONOS PCT: 6 %
MPV: 9.7 fL (ref 7.5–12.5)
Monocytes Absolute: 660 cells/uL (ref 200–950)
Neutro Abs: 5720 cells/uL (ref 1500–7800)
Neutrophils Relative %: 52 %
PLATELETS: 624 10*3/uL — AB (ref 140–400)
RBC: 4.56 MIL/uL (ref 3.80–5.10)
RDW: 18.7 % — AB (ref 11.0–15.0)
WBC: 11 10*3/uL — AB (ref 3.8–10.8)

## 2016-07-23 NOTE — Progress Notes (Signed)
GYNECOLOGY  VISIT   HPI: 50 y.o.   Single  African American  female   [id number] with Patient's last menstrual period was 05/08/2016 (exact date).   here for 6 week follow up  ROBOTIC ASSISTED TOTAL HYSTERECTOMY WITH SALPINGECTOMY (Bilateral Abdomen) CYSTOSCOPY (N/A Bladder).  Patient complains of being lightheaded today, abdominal "pulling sensation" around incisions and gas. Symptoms started 3 days ago.  Feels like she is going to pass out.  Bloated and gassy.  Difficult to have BMs.  Last BM was yesterday.  Nausea but not vomiting.   No fevers.  No one sick at home.  Voiding well.   No vaginal bleeding.    GYNECOLOGIC HISTORY: Patient's last menstrual period was 05/08/2016 (exact date). Contraception:  Hysterectomy Menopausal hormone therapy:  none Last mammogram:  02/29/16 Density B/Neg/BIRADS1: [facility] Last pap smear:   03/12/16 Neg, Neg HR HPV          OB History    Gravida Para Term Preterm AB Living   3 3 3          SAB TAB Ectopic Multiple Live Births                     Patient Active Problem List   Diagnosis Date Noted  . Status post laparoscopic hysterectomy 06/11/2016  . Iron deficiency anemia 02/15/2016  . Myalgia 02/15/2016  . Upper airway cough syndrome 10/21/2015  . Polyarthralgia 06/16/2015  . Diabetes mellitus type 2, uncomplicated ([place]) [id number]  . Keloid scar, cervical incision 09/02/2013  . Reaction, situational, acute, to stress 07/02/2013  . 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 Severe obesity (BMI >= 40) ([place]) 03/27/2013  . GERD (gastroesophageal reflux disease) 02/15/2013  . Hypocalcemia 01/07/2013  . Chest pain on exertion 07/24/2011    Past Medical History:  Diagnosis Date  . Abnormal Pap smear of cervix 1990   Hx cryotherapy to cervix  . Abnormal uterine bleeding   . Anemia 02/20/16   Transfusion of 1 unit pRBCs  . Anxiety   . Arthritis   . Asthma   . 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 Chest pain    per pt  due to anemia-   . Diabetes mellitus without complication ([place])   . Fibroid   . Generalized pain   . GERD (gastroesophageal reflux disease)   . Headache(784.0)   . History of blood transfusion 02/20/2016  . Hyperlipidemia   . Hypertension    per Dr. Irven Shelling note 08/30/2011  . Hypothyroidism   . Leg swelling    bilateral  . Neuromuscular disorder ([place])   . Shortness of breath dyspnea    on exertion due to anemia  . Thyroid disease    Hx 3 goiters--hx thyroidectomy--sees Dr. Forde Dandy    Past Surgical History:  Procedure Laterality Date  . CARDIAC CATHETERIZATION  2013  . CESAREAN SECTION  1995  . CESAREAN SECTION  1996  . COLONOSCOPY    . CYSTOSCOPY N/A 06/11/2016   Procedure: CYSTOSCOPY;  Surgeon: [name], MD;  Location: [facility];  Service: Gynecology;  Laterality: N/A;  . HYSTEROSCOPY W/D&C N/A 04/10/2016   Procedure: HYSTEROSCOPY WITH fractional dilatation and curretage;  Surgeon: [name], MD;  Location: [facility];  Service: Gynecology;  Laterality: N/A;  . LEFT HEART CATHETERIZATION WITH CORONARY ANGIOGRAM  07/24/2011   Procedure: LEFT HEART CATHETERIZATION WITH CORONARY ANGIOGRAM;  Surgeon: Laverda Page, MD;  Location: [facility] CATH LAB;  Service: Cardiovascular;;  .  ROBOTIC ASSISTED TOTAL HYSTERECTOMY WITH SALPINGECTOMY Bilateral 06/11/2016   Procedure: ROBOTIC ASSISTED TOTAL HYSTERECTOMY WITH SALPINGECTOMY;  Surgeon: [name], MD;  Location: [facility];  Service: Gynecology;  Laterality: Bilateral;  . THYROIDECTOMY N/A 12/18/2012   Procedure: TOTAL THYROIDECTOMY;  Surgeon: Earnstine Regal, MD;  Location: [facility];  Service: General;  Laterality: N/A;  . UPPER GI ENDOSCOPY      Current Outpatient Prescriptions  Medication Sig Dispense Refill  . albuterol (PROVENTIL HFA;VENTOLIN HFA) 108 (90 Base) MCG/ACT inhaler Inhale 2 puffs into the lungs daily as needed. Reported on 01/04/2016 18 g 1  . ALPRAZolam (XANAX) 0.5 MG tablet Take 1 tablet (0.5 mg  total) by mouth at bedtime as needed. for sleep 30 tablet 0  . Canagliflozin-Metformin HCl (INVOKAMET) 150-500 MG TABS Take 1 tablet by mouth daily. 90 tablet 0  . chlorthalidone (HYGROTON) 25 MG tablet TAKE 1 TABLET(25 MG) BY MOUTH DAILY 90 tablet 1  . Cholecalciferol (VITAMIN D3) 50000 units CAPS Take 1 capsule by mouth once a week.  3  . diazepam (VALIUM) 5 MG tablet Take 1 tablet (5 mg total) by mouth daily. 30 tablet 0  . ferrous sulfate 325 (65 FE) MG tablet Take 325 mg by mouth 3 (three) times daily with meals.    Marland Kitchen ibuprofen (ADVIL,MOTRIN) 800 MG tablet Take 1 tablet (800 mg total) by mouth every 8 (eight) hours as needed. 30 tablet 0  . levothyroxine (SYNTHROID, LEVOTHROID) 300 MCG tablet Take 1 tablet (300 mcg total) by mouth daily. 90 tablet 3  . lisinopril (PRINIVIL,ZESTRIL) 2.5 MG tablet TAKE 1 TABLET BY MOUTH EVERY DAY 90 tablet 1  . nystatin (MYCOSTATIN) powder Apply topically 4 (four) times daily. (Patient taking differently: Apply topically 4 (four) times daily as needed (For heat rash.). ) 60 g 2  . pantoprazole (PROTONIX) 40 MG tablet TAKE 1 TABLET BY MOUTH TWICE DAILY 180 tablet 1  . potassium chloride SA (K-DUR,KLOR-CON) 20 MEQ tablet Take 1 tablet (20 mEq total) by mouth 2 (two) times daily. 60 tablet 0  . sertraline (ZOLOFT) 100 MG tablet TAKE 1 TABLET BY MOUTH EVERY DAY 30 tablet 0  . sucralfate (CARAFATE) 1 G tablet Take 1 tablet (1 g total) by mouth 4 (four) times daily. 30 tablet 0   No current facility-administered medications for this visit.      ALLERGIES: Latex; Dilaudid [hydromorphone hcl]; and Hydrocodone  Family History  Problem Relation Age of Onset  . Heart disease Mother   . Stroke Mother   . Hypertension Mother   . Heart disease Father   . Colon cancer Father   . Hypertension Father   . Diabetes Father   . Cancer Paternal Grandmother   . Asthma Daughter   . Seizures Daughter   . Hypertension Sister   . Thyroid disease Sister   . Mental illness  Daughter 83    suicide attempt 06/2013  . Asthma Sister   . Hypertension Sister   . Breast cancer Paternal Aunt   . Cancer Paternal Aunt     Social History   Social History  . Marital status: Single    Spouse name: n/a  . Number of children: 4  . Years of education: N/A   Occupational History  . [employer]  . receiving (returns)     [employer]   Social History Main Topics  . Smoking status: Never Smoker  . Smokeless tobacco: Never Used  . Alcohol use 0.0 oz/week  Comment: Occasional glass of wine  . Drug use: No  . Sexual activity: Yes    Partners: Male    Birth control/ protection: Abstinence, Surgical     Comment: Hyst   Other Topics Concern  . Not on file   Social History Narrative   Lives with the youngest of her 3 daughters.   Her oldest daughter lives in [place].   Her middle daughter lives independently, and is due with her first child 12/01/15.   Her son lives with his father, who moved out 04/2015, nearby.       ROS:  Pertinent items are noted in HPI.  PHYSICAL EXAMINATION:    BP 122/74 (BP Location: Right Arm, Patient Position: Sitting, Cuff Size: Large)   Pulse 100   Ht 5\' 3"  (1.6 m)   Wt 268 lb (121.6 kg)   LMP 05/08/2016 (Exact Date)   BMI 47.47 kg/m     General appearance: alert, cooperative and appears stated age   Abdomen: incisions intact, soft, non-tender, no masses,  no organomegaly, normal bowel sounds.   Pelvic: External genitalia:  no lesions              Urethra:  normal appearing urethra with no masses, tenderness or lesions              Bartholins and Skenes: normal                 Vagina: normal appearing vagina with normal color and discharge, no lesions              Cervix:  Absent.  Cuff intact.                Bimanual Exam:  Uterus:   absent              Adnexa: no mass, fullness, tenderness            Chaperone was present for exam.  ASSESSMENT  Status post robotic TLH/bilateral salpingectomy.   Malaise.  Constipation.   PLAN  CBC with diff, CMP today.  Use glycerin suppository.  Then start using Miralax prn.  Return to all normal activity in 2 weeks.  Annual in June 2018.    An After Visit Summary was printed and given to the patient.  ______ minutes face to face time of which over 50% was spent in counseling.

## 2016-07-24 ENCOUNTER — Emergency Department (HOSPITAL_COMMUNITY)
Admission: EM | Admit: 2016-07-24 | Discharge: 2016-07-24 | Disposition: A | Discharge status: Home / Self Care | Payer: BLUE CROSS/BLUE SHIELD | Attending: Emergency Medicine | Admitting: Emergency Medicine

## 2016-07-24 ENCOUNTER — Emergency Department (HOSPITAL_COMMUNITY): Payer: BLUE CROSS/BLUE SHIELD

## 2016-07-24 ENCOUNTER — Telehealth: Payer: Self-pay | Admitting: *Deleted

## 2016-07-24 ENCOUNTER — Encounter (HOSPITAL_COMMUNITY): Payer: Self-pay | Admitting: *Deleted

## 2016-07-24 ENCOUNTER — Telehealth: Payer: Self-pay

## 2016-07-24 DIAGNOSIS — E119 Type 2 diabetes mellitus without complications: Secondary | ICD-10-CM | POA: Diagnosis not present

## 2016-07-24 DIAGNOSIS — Z91040 Latex allergy status: Secondary | ICD-10-CM | POA: Insufficient documentation

## 2016-07-24 DIAGNOSIS — K2970 Gastritis, unspecified, without bleeding: Secondary | ICD-10-CM

## 2016-07-24 DIAGNOSIS — Z79899 Other long term (current) drug therapy: Secondary | ICD-10-CM | POA: Insufficient documentation

## 2016-07-24 DIAGNOSIS — R112 Nausea with vomiting, unspecified: Secondary | ICD-10-CM | POA: Insufficient documentation

## 2016-07-24 DIAGNOSIS — Z8543 Personal history of malignant neoplasm of ovary: Secondary | ICD-10-CM | POA: Insufficient documentation

## 2016-07-24 DIAGNOSIS — D649 Anemia, unspecified: Secondary | ICD-10-CM

## 2016-07-24 DIAGNOSIS — Z955 Presence of coronary angioplasty implant and graft: Secondary | ICD-10-CM | POA: Diagnosis not present

## 2016-07-24 DIAGNOSIS — I10 Essential (primary) hypertension: Secondary | ICD-10-CM | POA: Insufficient documentation

## 2016-07-24 DIAGNOSIS — R1010 Upper abdominal pain, unspecified: Secondary | ICD-10-CM

## 2016-07-24 DIAGNOSIS — K2990 Gastroduodenitis, unspecified, without bleeding: Secondary | ICD-10-CM | POA: Insufficient documentation

## 2016-07-24 DIAGNOSIS — E039 Hypothyroidism, unspecified: Secondary | ICD-10-CM | POA: Diagnosis not present

## 2016-07-24 DIAGNOSIS — K2980 Duodenitis without bleeding: Secondary | ICD-10-CM | POA: Diagnosis not present

## 2016-07-24 DIAGNOSIS — J45909 Unspecified asthma, uncomplicated: Secondary | ICD-10-CM | POA: Diagnosis not present

## 2016-07-24 DIAGNOSIS — K5901 Slow transit constipation: Secondary | ICD-10-CM | POA: Diagnosis not present

## 2016-07-24 DIAGNOSIS — R1084 Generalized abdominal pain: Secondary | ICD-10-CM | POA: Diagnosis not present

## 2016-07-24 DIAGNOSIS — K5900 Constipation, unspecified: Secondary | ICD-10-CM | POA: Diagnosis not present

## 2016-07-24 LAB — COMPREHENSIVE METABOLIC PANEL
ALBUMIN: 3.6 g/dL (ref 3.5–5.0)
ALK PHOS: 75 U/L (ref 33–115)
ALT: 13 U/L (ref 6–29)
ALT: 14 U/L (ref 14–54)
AST: 19 U/L (ref 10–35)
AST: 19 U/L (ref 15–41)
Albumin: 4.3 g/dL (ref 3.6–5.1)
Alkaline Phosphatase: 70 U/L (ref 38–126)
Anion gap: 10 (ref 5–15)
BILIRUBIN TOTAL: 0.4 mg/dL (ref 0.2–1.2)
BUN: 20 mg/dL (ref 7–25)
BUN: 15 mg/dL (ref 6–20)
CHLORIDE: 99 mmol/L — AB (ref 101–111)
CO2: 26 mmol/L (ref 20–31)
CO2: 28 mmol/L (ref 22–32)
CREATININE: 1.01 mg/dL — AB (ref 0.44–1.00)
Calcium: 9.2 mg/dL (ref 8.6–10.2)
Calcium: 8.8 mg/dL — ABNORMAL LOW (ref 8.9–10.3)
Chloride: 98 mmol/L (ref 98–110)
Creat: 1.29 mg/dL — ABNORMAL HIGH (ref 0.50–1.10)
GFR calc Af Amer: >60 mL/min (ref >60)
GLUCOSE: 104 mg/dL — AB (ref 65–99)
Glucose, Bld: 149 mg/dL — ABNORMAL HIGH (ref 65–99)
POTASSIUM: 3.8 mmol/L (ref 3.5–5.3)
POTASSIUM: 3.4 mmol/L — AB (ref 3.5–5.1)
SODIUM: 137 mmol/L (ref 135–145)
Sodium: 138 mmol/L (ref 135–146)
Total Bilirubin: 0.8 mg/dL (ref 0.3–1.2)
Total Protein: 8.5 g/dL — ABNORMAL HIGH (ref 6.1–8.1)
Total Protein: 8.4 g/dL — ABNORMAL HIGH (ref 6.5–8.1)

## 2016-07-24 LAB — CBC WITH DIFFERENTIAL/PLATELET
Basophils Absolute: 0 10*3/uL (ref 0.0–0.1)
Basophils Relative: 0 %
Eosinophils Absolute: 0.1 10*3/uL (ref 0.0–0.7)
Eosinophils Relative: 1 %
HCT: 34.2 % — ABNORMAL LOW (ref 36.0–46.0)
Hemoglobin: 10.2 g/dL — ABNORMAL LOW (ref 12.0–15.0)
Lymphocytes Relative: 33 %
Lymphs Abs: 3.8 10*3/uL (ref 0.7–4.0)
MCH: 23.1 pg — ABNORMAL LOW (ref 26.0–34.0)
MCHC: 29.8 g/dL — ABNORMAL LOW (ref 30.0–36.0)
MCV: 77.4 fL — ABNORMAL LOW (ref 78.0–100.0)
Monocytes Absolute: 0.4 10*3/uL (ref 0.1–1.0)
Monocytes Relative: 4 %
Neutro Abs: 7.2 10*3/uL (ref 1.7–7.7)
Neutrophils Relative %: 62 %
Platelets: 550 10*3/uL — ABNORMAL HIGH (ref 150–400)
RBC: 4.42 MIL/uL (ref 3.87–5.11)
RDW: 18.5 % — ABNORMAL HIGH (ref 11.5–15.5)
WBC: 11.6 10*3/uL — ABNORMAL HIGH (ref 4.0–10.5)

## 2016-07-24 LAB — URINALYSIS, ROUTINE W REFLEX MICROSCOPIC
Bilirubin Urine: NEGATIVE
GLUCOSE, UA: 100 mg/dL — AB
HGB URINE DIPSTICK: NEGATIVE
Ketones, ur: NEGATIVE mg/dL
LEUKOCYTES UA: NEGATIVE
Nitrite: NEGATIVE
PROTEIN: NEGATIVE mg/dL
pH: 7 (ref 5.0–8.0)

## 2016-07-24 LAB — LIPASE, BLOOD: LIPASE: 35 U/L (ref 11–51)

## 2016-07-24 MED ORDER — MORPHINE SULFATE (PF) 4 MG/ML IV SOLN
4.0000 mg | Freq: Once | INTRAVENOUS | Status: AC
Start: 2016-07-24 — End: 2016-07-24
  Administered 2016-07-24: 4 mg via INTRAVENOUS
  Filled 2016-07-24: qty 1

## 2016-07-24 MED ORDER — DOCUSATE SODIUM 250 MG PO CAPS: 250.0000 mg | ORAL_CAPSULE | Freq: Every day | ORAL | 0 refills | Status: DC

## 2016-07-24 MED ORDER — ONDANSETRON 4 MG PO TBDP: 4.0000 mg | ORAL_TABLET | Freq: Three times a day (TID) | ORAL | 0 refills | Status: DC | PRN

## 2016-07-24 MED ORDER — POLYETHYLENE GLYCOL 3350 17 G PO PACK: 17.0000 g | PACK | Freq: Two times a day (BID) | ORAL | 0 refills | Status: AC

## 2016-07-24 MED ORDER — KETOROLAC TROMETHAMINE 30 MG/ML IJ SOLN
15.0000 mg | Freq: Once | INTRAMUSCULAR | Status: AC
  Administered 2016-07-24: 15 mg via INTRAVENOUS
  Filled 2016-07-24: qty 1

## 2016-07-24 MED ORDER — SODIUM CHLORIDE 0.9 % IV BOLUS (SEPSIS)
1000.0000 mL | Freq: Once | INTRAVENOUS | Status: AC
  Administered 2016-07-24: 1000 mL via INTRAVENOUS

## 2016-07-24 MED ORDER — RANITIDINE HCL 150 MG PO TABS: 150.0000 mg | ORAL_TABLET | Freq: Two times a day (BID) | ORAL | 0 refills | Status: DC

## 2016-07-24 MED ORDER — IOPAMIDOL (ISOVUE-300) INJECTION 61%
INTRAVENOUS | Status: AC
  Administered 2016-07-24: 100 mL via INTRAVENOUS
  Filled 2016-07-24: qty 100

## 2016-07-24 MED ORDER — ONDANSETRON HCL 4 MG/2ML IJ SOLN
4.0000 mg | Freq: Once | INTRAMUSCULAR | Status: AC
  Administered 2016-07-24: 4 mg via INTRAVENOUS
  Filled 2016-07-24: qty 2

## 2016-07-24 MED ORDER — MAGNESIUM CITRATE PO SOLN: 0.5000 | Freq: Once | ORAL | 0 refills | Status: AC

## 2016-07-24 NOTE — Telephone Encounter (Signed)
-----  Message from [name], MD sent at 07/24/2016 12:17 PM EST ----- Results to patient through My Chart. Please contact patient in follow up to this note.  She needs to see her PCP.   Hello Emily Mathis,   I am sharing your blood work with you.  Your blood sugar is a bit elevated and so is your creatinine, which is a measure of your kidney function.  Your white blood cell count is a little high, indicating you could have an infection right now.  Your hemoglobin is 10.7, which is low, but the best I have seen since I met you!  I recommend you take your iron sulfate 325 mg by mouth twice daily to improve your hemoglobin further.   I do recommend you follow up with your primary care provider as you are not feeling well.   I will have the nurse contact you to be sure you receive this information.   Josefa Half, MD  Cc- Mar[facility] Sprinkles

## 2016-07-24 NOTE — ED Notes (Signed)
Pt states minimal BM of hard stool yesterday after using glycerin suppository.

## 2016-07-24 NOTE — ED Notes (Signed)
Pt states she understands instructions. Home stable with family via wc.  

## 2016-07-24 NOTE — Telephone Encounter (Signed)
Pt would like [name] to know she is at [facility] checking in for pain.

## 2016-07-24 NOTE — Telephone Encounter (Signed)
PT LEFT MESSAGE ON ANSWERPHONE AND IS VOMITTING AND DIARRHEA AND WANTS TO KNOW WHAT TO DO   CONTACT [phone]

## 2016-07-24 NOTE — ED Notes (Signed)
Patient transported to CT 

## 2016-07-24 NOTE — Discharge Instructions (Signed)
Your abdominal pain is likely from gastritis or an ulcer, and related to your constipation. You will need to take zantac as directed, continue your home carafate and protonix as directed. Avoid spicy/fatty/acidic foods, avoid soda/coffee/tea/alcohol. Avoid laying down flat within 30 minutes of eating. Avoid NSAIDs like ibuprofen/aleve/motrin/etc on an empty stomach. May consider using over the counter tums/maalox as needed for additional relief. Use zofran as directed as needed for nausea. Use tylenol as needed for pain.   For your constipation: Stay well hydrated with plenty of water and increase the fiber in your diet. Take miralax as directed starting today (2 packets when you get home, if no stool produced 2 hours later then take 2 more packets; continue with 2 packets daily until soft daily stools produced; if watery stool produced then cut back to one packet daily or however you need to adjust it in order to continue having daily soft stools). Take colace starting today, as directed, in addition to the miralax, until you start having soft stools and then you can continue using it as needed to maintain daily soft stools, or stop using if stools become very watery. If no stool is produced in the next 24-48 hours (by 07/26/16) then try the magnesium citrate as directed. Continue using the glycerin suppositories given to you by your surgeon.   Follow up with your regular doctor in 4-6 days for ongoing evaluation and recheck of symptoms. Return to the ER for changes or worsening symptoms.  Abdominal (belly) pain can be caused by many things. Your caregiver performed an examination and possibly ordered blood/urine tests and imaging (CT scan, x-rays, ultrasound). Many cases can be observed and treated at home after initial evaluation in the emergency department. Even though you are being discharged home, abdominal pain can be unpredictable. Therefore, you need a repeated exam if your pain does not resolve,  returns, or worsens. Most patients with abdominal pain don't have to be admitted to the hospital or have surgery, but serious problems like appendicitis and gallbladder attacks can start out as nonspecific pain. Many abdominal conditions cannot be diagnosed in one visit, so follow-up evaluations are very important. SEEK IMMEDIATE MEDICAL ATTENTION IF YOU DEVELOP ANY OF THE FOLLOWING SYMPTOMS: The pain does not go away or becomes severe.  A temperature above 101 develops.  Repeated vomiting occurs (multiple episodes).  The pain becomes localized to portions of the abdomen. The right side could possibly be appendicitis. In an adult, the left lower portion of the abdomen could be colitis or diverticulitis.  Blood is being passed in stools or vomit (bright red or black tarry stools).  Return also if you develop chest pain, difficulty breathing, dizziness or fainting, or become confused, poorly responsive, or inconsolable (young children). The constipation stays for more than 4 days.  There is belly (abdominal) or rectal pain.  You do not seem to be getting better.

## 2016-07-24 NOTE — ED Provider Notes (Signed)
[facility] DEPT Provider Note   CSN: [id number] Arrival date & time: 07/24/16  0942     History   Chief Complaint Chief Complaint  Patient presents with  . Abdominal Pain    HPI Emily Mathis is a 49 y.o. female with a PMHx of anemia, DM2, DUB and uterine fibroids s/p robotic assisted hysterectomy on 06/11/16 by Dr. Silva, GERD, HTN, HLD, peripheral edema, hypothyroidism, and additional medical history listed below, with an additional significant PSHx of C/S x2, who presents to the ED with complaints of gradually worsening constipation, abdominal cramping and bloating, difficulty passing gas, and nausea and vomiting. Patient states that she had a hysterectomy on 06/11/16 (6 weeks ago), had some bloating and abd cramping after that which was expected, but on Sunday (2 days ago) she began having difficulty having a bowel movement and worsening cramping and bloating. She was seen by her surgeon Dr. Silva yesterday, had labs drawn that showed a mildly elevated Cr 1.29, WBC 11 but hemoconcentrated and differential unremarkable, mild stable anemia, and stable thrombocytosis; was instructed to use glycerin suppositories and miralax; pelvic exam reported as reassuring/unremarkable per notes. Patient states that she used to glycerin suppository last night and had a very small hard bowel movement afterwards, that was at 9:30 PM, she has not been able to have bowel movement since then. She describes her abdominal pain as 10/10 generalized upper cramping sharp constant nonradiating pain with no known aggravating factors and unrelieved with Epsom salt, ginger ale, vinegar, olive oil, and glycerin suppository. She has not yet tried the Miralax. She reports 3 episodes of nonbloody nonbilious emesis this morning and ongoing nausea. She has not been able to pass flatus in several days. She admits that she has not been eating or drinking very much, last ate a meal around 2 PM yesterday other than a small cinnamon  raisin bagel this morning that she threw up. She states that after surgery she was taking Percocet for about 2 weeks, but stopped 4 weeks ago and has been taking ibuprofen since then. Her only abdominal surgeries have been 2 C/S and the hysterectomy. States that initially she thought her abd pain was indigestion, but then her symptoms progressed/worsened and that's what brought her to the ER today.    She denies any fevers, chills, chest pain, shortness breath, hematemesis, melena, hematochezia, diarrhea, dysuria, hematuria, vaginal bleeding or discharge, numbness, tingling, focal weakness, recent travel, sick contacts, alcohol use, or suspicious food intake.   The history is provided by the patient and medical records. No language interpreter was used.  Abdominal Pain   This is a recurrent problem. The current episode started more than 2 days ago. The problem occurs constantly. The problem has been gradually worsening. The pain is associated with a previous surgery. The pain is located in the epigastric region, LUQ, RUQ and periumbilical region. The quality of the pain is sharp and cramping. The pain is at a severity of 10/10. The pain is severe. Associated symptoms include flatus, nausea, vomiting and constipation. Pertinent negatives include fever, diarrhea, hematochezia, melena, dysuria, hematuria, arthralgias and myalgias. Nothing aggravates the symptoms. Nothing relieves the symptoms. Past workup comments: recent surgery for hysterectomy; labs at surgeon's office yesterday. Her past medical history is significant for GERD.    Past Medical History:  Diagnosis Date  . Abnormal Pap smear of cervix 1990   Hx cryotherapy to cervix  . Abnormal uterine bleeding   . Anemia 02/20/16   Transfusion of 1 unit pRBCs  .   Anxiety   . Arthritis   . Asthma   . Cancer (HCC)    Pre-Cancer-Ovarian  . Chest pain    per pt due to anemia-   . Diabetes mellitus without complication (HCC)   . Fibroid   .  Generalized pain   . GERD (gastroesophageal reflux disease)   . Headache(784.0)   . History of blood transfusion 02/20/2016  . Hyperlipidemia   . Hypertension    per Dr. Ganji's note 08/30/2011  . Hypothyroidism   . Leg swelling    bilateral  . Neuromuscular disorder (HCC)   . Shortness of breath dyspnea    on exertion due to anemia  . Thyroid disease    Hx 3 goiters--hx thyroidectomy--sees Dr. South    Patient Active Problem List   Diagnosis Date Noted  . Status post laparoscopic hysterectomy 06/11/2016  . Iron deficiency anemia 02/15/2016  . Myalgia 02/15/2016  . Upper airway cough syndrome 10/21/2015  . Polyarthralgia 06/16/2015  . Diabetes mellitus type 2, uncomplicated (HCC) 09/14/2013  . Keloid scar, cervical incision 09/02/2013  . Reaction, situational, acute, to stress 07/02/2013  . 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 Severe obesity (BMI >= 40) (HCC) 03/27/2013  . GERD (gastroesophageal reflux disease) 02/15/2013  . Hypocalcemia 01/07/2013  . Chest pain on exertion 07/24/2011    Past Surgical History:  Procedure Laterality Date  . CARDIAC CATHETERIZATION  2013  . CESAREAN SECTION  1995  . CESAREAN SECTION  1996  . COLONOSCOPY    . CYSTOSCOPY N/A 06/11/2016   Procedure: CYSTOSCOPY;  Surgeon: Brook E Amundson C Silva, MD;  Location: [facility];  Service: Gynecology;  Laterality: N/A;  . HYSTEROSCOPY W/D&C N/A 04/10/2016   Procedure: HYSTEROSCOPY WITH fractional dilatation and curretage;  Surgeon: Brook E Amundson C Silva, MD;  Location: [facility];  Service: Gynecology;  Laterality: N/A;  . LEFT HEART CATHETERIZATION WITH CORONARY ANGIOGRAM  07/24/2011   Procedure: LEFT HEART CATHETERIZATION WITH CORONARY ANGIOGRAM;  Surgeon: Jagadeesh R Ganji, MD;  Location: [facility];  Service: Cardiovascular;;  . ROBOTIC ASSISTED TOTAL HYSTERECTOMY WITH SALPINGECTOMY Bilateral 06/11/2016   Procedure: ROBOTIC ASSISTED TOTAL HYSTERECTOMY WITH SALPINGECTOMY;  Surgeon: Brook E  Amundson C Silva, MD;  Location: [facility];  Service: Gynecology;  Laterality: Bilateral;  . THYROIDECTOMY N/A 12/18/2012   Procedure: TOTAL THYROIDECTOMY;  Surgeon: Todd M Gerkin, MD;  Location: [facility];  Service: General;  Laterality: N/A;  . UPPER GI ENDOSCOPY      OB History    Gravida Para Term Preterm AB Living   3 3 3         SAB TAB Ectopic Multiple Live Births                   Home Medications    Prior to Admission medications   Medication Sig Start Date End Date Taking? Authorizing Provider  albuterol (PROVENTIL HFA;VENTOLIN HFA) 108 (90 Base) MCG/ACT inhaler Inhale 2 puffs into the lungs daily as needed. Reported on 01/04/2016 03/28/16   [name] Jeffery, PA-C  ALPRAZolam (XANAX) 0.5 MG tablet Take 1 tablet (0.5 mg total) by mouth at bedtime as needed. for sleep 07/03/16   [name] Jeffery, PA-C  Canagliflozin-Metformin HCl (INVOKAMET) 150-500 MG TABS Take 1 tablet by mouth daily. 04/25/16   [name] Jeffery, PA-C  chlorthalidone (HYGROTON) 25 MG tablet TAKE 1 TABLET(25 MG) BY MOUTH DAILY 03/29/16   [name] Jeffery, PA-C  Cholecalciferol (VITAMIN D3) 50000 units CAPS Take 1 capsule by mouth once a week. 06/14/16     Historical Provider, MD  diazepam (VALIUM) 5 MG tablet Take 1 tablet (5 mg total) by mouth daily. 07/03/16   [name] Jeffery, PA-C  ferrous sulfate 325 (65 FE) MG tablet Take 325 mg by mouth 3 (three) times daily with meals.    Historical Provider, MD  ibuprofen (ADVIL,MOTRIN) 800 MG tablet Take 1 tablet (800 mg total) by mouth every 8 (eight) hours as needed. 06/22/16   Brook E Amundson C Silva, MD  levothyroxine (SYNTHROID, LEVOTHROID) 300 MCG tablet Take 1 tablet (300 mcg total) by mouth daily. 06/17/15   [name] Jeffery, PA-C  lisinopril (PRINIVIL,ZESTRIL) 2.5 MG tablet TAKE 1 TABLET BY MOUTH EVERY DAY 03/29/16   [name] Jeffery, PA-C  nystatin (MYCOSTATIN) powder Apply topically 4 (four) times daily. Patient taking differently: Apply topically 4 (four) times daily as needed (For heat  rash.).  10/02/15   [name] Jeffery, PA-C  pantoprazole (PROTONIX) 40 MG tablet TAKE 1 TABLET BY MOUTH TWICE DAILY 03/29/16   [name] Jeffery, PA-C  potassium chloride SA (K-DUR,KLOR-CON) 20 MEQ tablet Take 1 tablet (20 mEq total) by mouth 2 (two) times daily. 06/12/16   Brook E Amundson C Silva, MD  sertraline (ZOLOFT) 100 MG tablet TAKE 1 TABLET BY MOUTH EVERY DAY 07/13/16   [name] Jeffery, PA-C  sucralfate (CARAFATE) 1 G tablet Take 1 tablet (1 g total) by mouth 4 (four) times daily. 04/19/15   Olga Otter, MD    Family History Family History  Problem Relation Age of Onset  . Heart disease Mother   . Stroke Mother   . Hypertension Mother   . Heart disease Father   . Colon cancer Father   . Hypertension Father   . Diabetes Father   . Cancer Paternal Grandmother   . Asthma Daughter   . Seizures Daughter   . Hypertension Sister   . Thyroid disease Sister   . Mental illness Daughter 19    suicide attempt 06/2013  . Asthma Sister   . Hypertension Sister   . Breast cancer Paternal Aunt   . Cancer Paternal Aunt     Social History Social History  Substance Use Topics  . Smoking status: Never Smoker  . Smokeless tobacco: Never Used  . Alcohol use 0.0 oz/week     Comment: Occasional glass of wine     Allergies   Latex; Dilaudid [hydromorphone hcl]; and Hydrocodone   Review of Systems Review of Systems  Constitutional: Negative for chills and fever.  Respiratory: Negative for shortness of breath.   Cardiovascular: Negative for chest pain.  Gastrointestinal: Positive for abdominal distention (bloating), abdominal pain, constipation, flatus, nausea and vomiting. Negative for blood in stool, diarrhea, hematochezia and melena.       +no flatus/obstipation  Genitourinary: Negative for dysuria, hematuria, vaginal bleeding and vaginal discharge.  Musculoskeletal: Negative for arthralgias and myalgias.  Skin: Negative for color change.  Allergic/Immunologic: Positive for  immunocompromised state (diabetes).  Neurological: Negative for weakness and numbness.  Psychiatric/Behavioral: Negative for confusion.   10 Systems reviewed and are negative for acute change except as noted in the HPI.   Physical Exam Updated Vital Signs BP 132/82 (BP Location: Right Arm)   Pulse 92   Temp 97.7 F (36.5 C) (Oral)   Resp 18   Ht 5' 3" (1.6 m)   Wt 121.6 kg   LMP 05/08/2016 (Exact Date)   SpO2 100%   BMI 47.47 kg/m   Physical Exam  Constitutional: She is oriented to person, place, and time. Vital signs are   normal. She appears well-developed and well-nourished.  Non-toxic appearance. No distress.  Afebrile, nontoxic, NAD although appears uncomfortable, obese AAF  HENT:  Head: Normocephalic and atraumatic.  Mouth/Throat: Oropharynx is clear and moist. Mucous membranes are dry.  Mildly dry mucous membranes  Eyes: Conjunctivae and EOM are normal. Pupils are equal, round, and reactive to light. Right eye exhibits no discharge. Left eye exhibits no discharge.  Slight conjunctival pallor  Neck: Normal range of motion. Neck supple.  Cardiovascular: Normal rate, regular rhythm, normal heart sounds and intact distal pulses.  Exam reveals no gallop and no friction rub.   No murmur heard. Pulmonary/Chest: Effort normal and breath sounds normal. No respiratory distress. She has no decreased breath sounds. She has no wheezes. She has no rhonchi. She has no rales.  Abdominal: Soft. Normal appearance. She exhibits distension (mild; exam limited due to body habitus). Bowel sounds are decreased. There is tenderness in the right upper quadrant, epigastric area, periumbilical area and left upper quadrant. There is guarding (voluntary). There is no rigidity, no rebound, no CVA tenderness, no tenderness at McBurney's point and negative Murphy's sign.    Exam limited due to body habitus Soft, ?mild distension, difficult to assess due to body habitus; +BS throughout although hypoactive  in the lower quadrants, mild voluntary guarding, no rebound or rigidity, neg murphy's, neg mcburney's, no CVA TTP   Genitourinary:  Genitourinary Comments: Deferred due to GYN exam at surgeon's office yesterday  Musculoskeletal: Normal range of motion.  Neurological: She is alert and oriented to person, place, and time. She has normal strength. No sensory deficit.  Skin: Skin is warm, dry and intact. No rash noted.  Psychiatric: She has a normal mood and affect.  Nursing note and vitals reviewed.    ED Treatments / Results  Labs (all labs ordered are listed, but only abnormal results are displayed) Labs Reviewed  CBC WITH DIFFERENTIAL/PLATELET - Abnormal; Notable for the following:       Result Value   WBC 11.6 (*)    Hemoglobin 10.2 (*)    HCT 34.2 (*)    MCV 77.4 (*)    MCH 23.1 (*)    MCHC 29.8 (*)    RDW 18.5 (*)    Platelets 550 (*)    All other components within normal limits  COMPREHENSIVE METABOLIC PANEL - Abnormal; Notable for the following:    Potassium 3.4 (*)    Chloride 99 (*)    Glucose, Bld 149 (*)    Creatinine, Ser 1.01 (*)    Calcium 8.8 (*)    Total Protein 8.4 (*)    All other components within normal limits  URINALYSIS, ROUTINE W REFLEX MICROSCOPIC (NOT AT [facility]) - Abnormal; Notable for the following:    APPearance CLOUDY (*)    Specific Gravity, Urine >1.046 (*)    Glucose, UA 100 (*)    All other components within normal limits  LIPASE, BLOOD    07/23/16 labs at surgeon's office: Results for orders placed or performed in visit on 07/23/16  CBC w/Diff  Result Value Ref Range   WBC 11.0 (H) 3.8 - 10.8 K/uL   RBC 4.56 3.80 - 5.10 MIL/uL   Hemoglobin 10.7 (L) 11.7 - 15.5 g/dL   HCT 35.9 35.0 - 45.0 %   MCV 78.7 (L) 80.0 - 100.0 fL   MCH 23.5 (L) 27.0 - 33.0 pg   MCHC 29.8 (L) 32.0 - 36.0 g/dL   RDW 18.7 (H) 11.0 - 15.0 %  Platelets 624 (H) 140 - 400 K/uL   MPV 9.7 7.5 - 12.5 fL   Neutro Abs 5,720 1,500 - 7,800 cells/uL   Lymphs Abs 4,290 (H)  850 - 3,900 cells/uL   Monocytes Absolute 660 200 - 950 cells/uL   Eosinophils Absolute 220 15 - 500 cells/uL   Basophils Absolute 110 0 - 200 cells/uL   Neutrophils Relative % 52 %   Lymphocytes Relative 39 %   Monocytes Relative 6 %   Eosinophils Relative 2 %   Basophils Relative 1 %   Smear Review Criteria for review not met   Comprehensive metabolic panel  Result Value Ref Range   Sodium 138 135 - 146 mmol/L   Potassium 3.8 3.5 - 5.3 mmol/L   Chloride 98 98 - 110 mmol/L   CO2 26 20 - 31 mmol/L   Glucose, Bld 104 (H) 65 - 99 mg/dL   BUN 20 7 - 25 mg/dL   Creat 1.29 (H) 0.50 - 1.10 mg/dL   Total Bilirubin 0.4 0.2 - 1.2 mg/dL   Alkaline Phosphatase 75 33 - 115 U/L   AST 19 10 - 35 U/L   ALT 13 6 - 29 U/L   Total Protein 8.5 (H) 6.1 - 8.1 g/dL   Albumin 4.3 3.6 - 5.1 g/dL   Calcium 9.2 8.6 - 10.2 mg/dL    EKG  EKG Interpretation None       Radiology Ct Abdomen Pelvis W Contrast  Result Date: 07/24/2016 CLINICAL DATA:  Status post abdominal hysterectomy 05/08/2016. Periumbilical pain and constipation for 3 days. Nausea and vomiting today. EXAM: CT ABDOMEN AND PELVIS WITH CONTRAST TECHNIQUE: Multidetector CT imaging of the abdomen and pelvis was performed using the standard protocol following bolus administration of intravenous contrast. CONTRAST:  100 ml ISOVUE-300 IOPAMIDOL (ISOVUE-300) INJECTION 61% COMPARISON:  None. FINDINGS: Lower chest: No pleural or pericardial effusion. Heart size is upper normal. Mild dependent atelectasis noted. Hepatobiliary: Unremarkable. Pancreas: Stranding is seen about the head and neck of the pancreas. No ductal dilatation or focal lesion. No focal fluid collection. The pancreas enhances homogeneously. Splenic and portal veins are patent. Spleen: Unremarkable. Adrenals/Urinary Tract: Unremarkable. Stomach/Bowel: The appendix appears normal. There is fecalization of contents of the terminal ileum. The terminal ileum and remainder of the small bowel  are otherwise unremarkable. Prominent stool in the ascending and proximal transverse colon is identified. Vascular/Lymphatic: Unremarkable. Reproductive: Status post hysterectomy without evidence of complication. Otherwise unremarkable. Other: No fluid collection. Musculoskeletal: No lytic or sclerotic lesion. IMPRESSION: Stranding about the head and neck of the pancreas most consistent with acute pancreatitis or possibly duodenitis. No other acute abnormality is identified. The appendix is normal in appearance. Prominent stool burden ascending and proximal transverse colon. Fecalized contents of the terminal ileum consistent with slow transit also identified. Status post hysterectomy.  No complicating feature identified. Electronically Signed   By: Thomas  Dalessio M.D.   On: 07/24/2016 13:23    Procedures Procedures (including critical care time)  Medications Ordered in ED Medications  sodium chloride 0.9 % bolus 1,000 mL (0 mLs Intravenous Stopped 07/24/16 1409)  ondansetron (ZOFRAN) injection 4 mg (4 mg Intravenous Given 07/24/16 1217)  morphine 4 MG/ML injection 4 mg (4 mg Intravenous Given 07/24/16 1220)  iopamidol (ISOVUE-300) 61 % injection (100 mLs Intravenous Contrast Given 07/24/16 1249)  ketorolac (TORADOL) 30 MG/ML injection 15 mg (15 mg Intravenous Given 07/24/16 1355)     Initial Impression / Assessment and Plan / ED Course  I have reviewed   the triage vital signs and the nursing notes.  Pertinent labs & imaging results that were available during my care of the patient were reviewed by me and considered in my medical decision making (see chart for details).  Clinical Course     49 y.o. female here with constipation, obstipation, abd bloating, abd cramping, and n/v x2 days. Had hysterectomy 6wks ago, said she had some bloating and cramping after which was expected, but then Sunday she started having more difficulty passing flatus and having BMs. Saw her surgeon yesterday who  recommended glycerin suppositories; tried that and had a small hard BM but symptoms continued. Labs done yesterday at her surgeon's office showed a mildly elevated Cr 1.29 likely from mild dehydration; WBC 11 but hemoconcentrated and differential unremarkable, mild stable anemia (hemoconcentrated), and stable thrombocytosis; otherwise unremarkable CBC/CMP. She admits to not drinking much or eating much, likely mildly dehydrated. On exam, obese which limits exam, but has some mild abd distension, moderate upper abd TTP, no lower abd TTP, hypoactive BS in lower quadrants, some mild voluntary guarding but no rebound or rigidity. Dry mucous membranes. Will give fluids, zofran, morphine, and get basic labs to recheck kidney function and WBC today, as well as U/A, and CT abd/pelv to r/o perf, obstruction, post-op complications, etc. Will reassess shortly  2:48 PM  CBC w/diff with mild leukocytosis again seen, WBC 11.6 but differential WNL, and again appears hemoconcentrated; chronic stable anemia; chronic thrombocytosis plt 550. CMP with Cr slightly better than yesterday, 1.01 today; gluc 149. K 3.4, doubt need for repletion. Lipase WNL. U/A unremarkable. CT abd/pelv showing stranding around head and neck of pancreas c/w duodenitis vs pancreatitis; given that she has a normal lipase this many days after onset, and hx of GERD, duodenitis more likely; also shows prominent stool burden in the ascending and transverse colon, none in the descending colon or rectum, nothing that would be amendable to manual disimpaction. Pt feeling much better after toradol given in addition to the morphine from earlier; pain improved, nausea resolved. Will PO challenge. If she tolerates PO well, will likely d/c home with zantac to start in addition to her home protonix and carafate, and start aggressive bowel regimen of miralax and colace, will rx mg citrate if no BM in the next 24-48hrs after miralax/colace. Increase fiber/water intake  discussed. Diet modifications for GERD/PUD discussed. Tylenol for pain; consider tums/maalox for additional relief. Rx zofran. F/up with PCP in 4-6 days for recheck of symptoms and ongoing management. Will reassess after PO challenge.  3:15 PM Tolerating PO well. D/c home with previously discussed plan. I explained the diagnosis and have given explicit precautions to return to the ER including for any other new or worsening symptoms. The patient understands and accepts the medical plan as it's been dictated and I have answered their questions. Discharge instructions concerning home care and prescriptions have been given. The patient is STABLE and is discharged to home in good condition.   Final Clinical Impressions(s) / ED Diagnoses   Final diagnoses:  Upper abdominal pain  Slow transit constipation  Duodenitis  Gastritis, presence of bleeding unspecified, unspecified chronicity, unspecified gastritis type  Nausea and vomiting in adult patient  Chronic anemia    New Prescriptions New Prescriptions   DOCUSATE SODIUM (COLACE) 250 MG CAPSULE    Take 1 capsule (250 mg total) by mouth daily.   MAGNESIUM CITRATE SOLN    Take 148 mLs (0.5 Bottles total) by mouth once. TAKE 1/2 BOTTLE IF YOU HAVEN'T   HAD A BOWEL MOVEMENT BY 07/26/16; IF NO STOOL AFTER 2 HOURS THEN TAKE THE OTHER 1/2 BOTTLE   ONDANSETRON (ZOFRAN ODT) 4 MG DISINTEGRATING TABLET    Take 1 tablet (4 mg total) by mouth every 8 (eight) hours as needed for nausea or vomiting.   POLYETHYLENE GLYCOL (MIRALAX / GLYCOLAX) PACKET    Take 17 g by mouth 2 (two) times daily. Take 2 packets today (day one), wait 2 hours, if no stool produced then take 2 more packets on day one; then continue taking 2 packets daily until you achieve daily soft stools; if water stool develops then cut back to 1 packet daily.   RANITIDINE (ZANTAC) 150 MG TABLET    Take 1 tablet (150 mg total) by mouth 2 (two) times daily.      Camprubi-Soms, PA-C 07/24/16  1515    Kevin Campos, MD 07/25/16 1107  

## 2016-07-24 NOTE — ED Notes (Signed)
Pt had labs drawn yesterday

## 2016-07-24 NOTE — Telephone Encounter (Signed)
Left message to call Kelten Enochs at [phone].  

## 2016-07-24 NOTE — ED Triage Notes (Signed)
Pt reports having difficulty passing gas and having bowel movements. Pt reports abdominal pain and acid reflux. Pt states that she had hysterectomy on 10-25. Pt reports having difficulty having bowel movements since.

## 2016-07-24 NOTE — ED Notes (Signed)
Pt oob to br with steady gait 

## 2016-07-25 NOTE — Telephone Encounter (Signed)
Noted, will follow.

## 2016-07-25 NOTE — Telephone Encounter (Signed)
Patient went to [place] for pain and wants to keep you updated.

## 2016-07-26 NOTE — Telephone Encounter (Signed)
Spoke with patient, advised of lab results as seen below per Dr. Elza Rafter request. Patient states to let Dr. Quincy Simmonds know she was seen in ER 07/24/16 for abdominal pain that she was having. Patient states her right intestine was filled with stool, had a ulcer and inflammation in bowel. Patient states she plans to follow-up with PCP. Patient verbalizes understanding and is agreeable.  Routing to provider for final review. Patient is agreeable to disposition. Will close encounter.

## 2016-07-27 ENCOUNTER — Ambulatory Visit (INDEPENDENT_AMBULATORY_CARE_PROVIDER_SITE_OTHER): Payer: BLUE CROSS/BLUE SHIELD

## 2016-07-27 ENCOUNTER — Ambulatory Visit (INDEPENDENT_AMBULATORY_CARE_PROVIDER_SITE_OTHER): Payer: BLUE CROSS/BLUE SHIELD | Admitting: Physician Assistant

## 2016-07-27 VITALS — BP 128/86 | HR 86 | Temp 98.3°F | Resp 18 | Ht 63.0 in | Wt 268.2 lb

## 2016-07-27 DIAGNOSIS — R1084 Generalized abdominal pain: Secondary | ICD-10-CM

## 2016-07-27 DIAGNOSIS — R197 Diarrhea, unspecified: Secondary | ICD-10-CM | POA: Diagnosis not present

## 2016-07-27 LAB — CBC WITH DIFFERENTIAL/PLATELET
BASOS PCT: 1 %
Basophils Absolute: 85 cells/uL (ref 0–200)
EOS ABS: 170 {cells}/uL (ref 15–500)
Eosinophils Relative: 2 %
HCT: 34.1 % — ABNORMAL LOW (ref 35.0–45.0)
Hemoglobin: 10.3 g/dL — ABNORMAL LOW (ref 11.7–15.5)
LYMPHS ABS: 3655 {cells}/uL (ref 850–3900)
Lymphocytes Relative: 43 %
MCH: 23.4 pg — ABNORMAL LOW (ref 27.0–33.0)
MCHC: 30.2 g/dL — ABNORMAL LOW (ref 32.0–36.0)
MCV: 77.3 fL — AB (ref 80.0–100.0)
MONO ABS: 425 {cells}/uL (ref 200–950)
MONOS PCT: 5 %
MPV: 9.9 fL (ref 7.5–12.5)
NEUTROS ABS: 4165 {cells}/uL (ref 1500–7800)
Neutrophils Relative %: 49 %
PLATELETS: 601 10*3/uL — AB (ref 140–400)
RBC: 4.41 MIL/uL (ref 3.80–5.10)
RDW: 18.8 % — ABNORMAL HIGH (ref 11.0–15.0)
WBC: 8.5 10*3/uL (ref 3.8–10.8)

## 2016-07-27 NOTE — Progress Notes (Signed)
Subjective:    Patient ID: Emily Mathis, female    DOB: Jun 28, 1966, 50 y.o.   MRN: [id number]  Chief Complaint  Patient presents with  . Follow-up    post hospitalization   Patient Care Team: Harrison Mons, PA-C as PCP - General (Physician Assistant) Reynold Bowen, MD as Consulting Physician (Endocrinology) Armandina Gemma, MD as Consulting Physician (General Surgery) [name], MD as Consulting Physician (Obstetrics and Gynecology)  HPI: Presents for follow-up after ED visit 3 days ago for constipation, abdominal cramping, bloating, N/V, and difficulty passing gas. Patient states they did a CT scan which revealed that the right side of her colon was completely backed up with stool. She had a hysterectomy on 06/11/16 and thought the continued abdominal bloating and constipation was due to the operation but it began to worsen this week which prompted her to go to the ED. She was prescribed Miralax, Ranitidine, Colace, Zofran, and Magnesium Citrate.  She states since starting the regimen, the Magnesium Citrate has provided the most relief. However, now she notes she has been having diarrhea for the past 2 days and has had some continued abdominal bloating. Notes that her "stomach loosens up when she goes to the bathroom but then just gets hard again a little while after". Endorses decreased appetite and continued difficulty passing gas but does admit to some flatulence. Associated symptoms include continued belching, mild nausea, and one episode of blood in her stool yesterday evening. Denies fevers, chills, or vomiting since her visit to the ED. Last episode of emesis was 3 days ago.  She notes a knot on lower left leg with associated "burning" pain which started bothering her again about a week ago. 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 States it causes her pain randomly. Notes it hurts more when lying down and  moving her legs around provides some relief. Denies warmth or erythema at the site.   Review of Systems  Constitutional: Negative for appetite change, chills and fever.  HENT: Negative for congestion, ear discharge, ear pain, rhinorrhea and sore throat.   Eyes: Negative for pain, discharge and itching.  Respiratory: Positive for cough.   Gastrointestinal: Positive for abdominal distention, blood in stool, diarrhea, nausea and rectal pain. Negative for abdominal pain, anal bleeding, constipation and vomiting.  Genitourinary: Negative for dysuria, frequency, hematuria and urgency.   Allergies  Allergen Reactions  . Dilaudid [Hydromorphone Hcl] Itching  . Hydrocodone Itching  . Latex Itching and Other (See Comments)    leaves marks on skin   Prior to Admission medications   Medication Sig Start Date End Date Taking? Authorizing Provider  albuterol (PROVENTIL HFA;VENTOLIN HFA) 108 (90 Base) MCG/ACT inhaler Inhale 2 puffs into the lungs daily as needed. Reported on 01/04/2016 03/28/16  Yes [name] Jacqulynn Cadet, PA-C  ALPRAZolam (XANAX) 0.5 MG tablet Take 1 tablet (0.5 mg total) by mouth at bedtime as needed. for sleep 07/03/16  Yes [name] Jeffery, PA-C  Canagliflozin-Metformin HCl (INVOKAMET) 150-500 MG TABS Take 1 tablet by mouth daily. 04/25/16  Yes [name] Jeffery, PA-C  chlorthalidone (HYGROTON) 25 MG tablet TAKE 1 TABLET(25 MG) BY MOUTH DAILY 03/29/16  Yes [name] Jeffery, PA-C  Cholecalciferol (VITAMIN D3) 50000 units CAPS Take 50,000 Units by mouth every Monday.  06/14/16  Yes Historical Provider, MD  diazepam (VALIUM) 5 MG tablet Take 1 tablet (5 mg total) by mouth daily. 07/03/16  Yes Harrison Mons, PA-C  docusate sodium (COLACE) 250 MG capsule Take 1 capsule (250 mg total) by mouth daily. 07/24/16  Yes Mercedes Camprubi-Soms, PA-C  ferrous sulfate 325 (65 FE) MG tablet Take 325 mg by mouth 3 (three) times daily with meals.   Yes Historical Provider, MD  glycerin adult 2 g suppository Place 1  suppository rectally as needed for constipation.   Yes Historical Provider, MD  ibuprofen (ADVIL,MOTRIN) 800 MG tablet Take 1 tablet (800 mg total) by mouth every 8 (eight) hours as needed. Patient taking differently: Take 800 mg by mouth every 8 (eight) hours as needed for headache or moderate pain.  06/22/16  Yes Brook E Yisroel Ramming, MD  levothyroxine (SYNTHROID, LEVOTHROID) 300 MCG tablet Take 1 tablet (300 mcg total) by mouth daily. 06/17/15  Yes [name] Jeffery, PA-C  lisinopril (PRINIVIL,ZESTRIL) 2.5 MG tablet TAKE 1 TABLET BY MOUTH EVERY DAY Patient taking differently: TAKE 2.5 MG BY MOUTH EVERY DAY 03/29/16  Yes [name] Jeffery, PA-C  nystatin (MYCOSTATIN) powder Apply topically 4 (four) times daily. Patient taking differently: Apply topically 4 (four) times daily as needed (For heat rash.).  10/02/15  Yes [name] Jeffery, PA-C  pantoprazole (PROTONIX) 40 MG tablet TAKE 1 TABLET BY MOUTH TWICE DAILY Patient taking differently: TAKE 40 MG BY MOUTH TWICE DAILY 03/29/16  Yes [name] Jeffery, PA-C  potassium chloride SA (K-DUR,KLOR-CON) 20 MEQ tablet Take 1 tablet (20 mEq total) by mouth 2 (two) times daily. 06/12/16  Yes Brook E Yisroel Ramming, MD  ranitidine (ZANTAC) 150 MG tablet Take 1 tablet (150 mg total) by mouth 2 (two) times daily. 07/24/16  Yes Mercedes Camprubi-Soms, PA-C  sertraline (ZOLOFT) 100 MG tablet TAKE 1 TABLET BY MOUTH EVERY DAY Patient taking differently: TAKE 100 MG BY MOUTH EVERY DAY 07/13/16  Yes [name] Jeffery, PA-C  sucralfate (CARAFATE) 1 G tablet Take 1 tablet (1 g total) by mouth 4 (four) times daily. 04/19/15  Yes Linton Flemings, MD  ondansetron (ZOFRAN ODT) 4 MG disintegrating tablet Take 1 tablet (4 mg total) by mouth every 8 (eight) hours as needed for nausea or vomiting. Patient not taking: Reported on 07/27/2016 07/24/16   Mercedes Camprubi-Soms, PA-C  polyethylene glycol (MIRALAX / GLYCOLAX) packet Take 17 g by mouth 2 (two) times daily. Take 2 packets today (day one),  wait 2 hours, if no stool produced then take 2 more packets on day one; then continue taking 2 packets daily until you achieve daily soft stools; if water stool develops then cut back to 1 packet daily. Patient not taking: Reported on 07/27/2016 07/24/16   Zacarias Pontes, PA-C   Patient Active Problem List   Diagnosis Date Noted  . Status post laparoscopic hysterectomy 06/11/2016  . Iron deficiency anemia 02/15/2016  . Myalgia 02/15/2016  . Upper airway cough syndrome 10/21/2015  . Polyarthralgia 06/16/2015  . Diabetes mellitus type 2, uncomplicated ([place]) [id number]  . Keloid scar, cervical incision 09/02/2013  . Reaction, situational, acute, to stress 07/02/2013  . 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 Severe obesity (BMI >= 40) ([place]) 03/27/2013  . GERD (gastroesophageal reflux disease) 02/15/2013  . Hypocalcemia 01/07/2013  . Chest pain on exertion 07/24/2011       Objective: Blood pressure 128/86, pulse 86, temperature 98.3 F (36.8 C), temperature source Oral, resp. rate 18, height 5\' 3"  (1.6 m), weight 268 lb 3.2 oz (121.7 kg), last menstrual period 05/08/2016, SpO2 99 %.   Physical Exam  Constitutional: She is oriented to person, place, and time. She appears well-developed and well-nourished.  No distress.  HENT:  Head: Normocephalic and atraumatic.  Nose: No mucosal edema, rhinorrhea, sinus tenderness or septal deviation.  Mouth/Throat: Uvula is midline and oropharynx is clear and moist. Mucous membranes are not pale, not dry and not cyanotic. Normal dentition. No dental abscesses or lacerations. No oropharyngeal exudate, posterior oropharyngeal edema or posterior oropharyngeal erythema.  Eyes: Conjunctivae are normal. Pupils are equal, round, and reactive to light. Right eye exhibits no discharge. Left eye exhibits no discharge. No scleral icterus.  Neck: Normal range of motion. Neck supple. No tracheal deviation present. No thyromegaly present.  Cardiovascular:  Normal rate, regular rhythm, normal heart sounds and intact distal pulses.  Exam reveals no gallop and no friction rub.   No murmur heard. Pulmonary/Chest: Effort normal and breath sounds normal. No respiratory distress. She has no wheezes. She has no rales.  Abdominal: Soft. Bowel sounds are normal. She exhibits no distension, no abdominal bruit, no ascites and no mass. There is tenderness in the right upper quadrant and epigastric area. There is no rigidity, no rebound and no guarding.  Lymphadenopathy:    She has no cervical adenopathy.  Neurological: She is alert and oriented to person, place, and time.  Skin: Skin is warm and dry. No abrasion, no bruising, no burn, no ecchymosis, no laceration, no lesion, no petechiae and no rash noted. She is not diaphoretic. No erythema.     Psychiatric: She has a normal mood and affect. Her behavior is normal.    Dg Abd Acute W/chest  Result Date: 07/27/2016 CLINICAL DATA:  Yesterday and today diarrhea, some blood in stool last night (1 episode), Nausea. EXAM: DG ABDOMEN ACUTE W/ 1V CHEST COMPARISON:  Chest radiograph, 02/20/2016 FINDINGS: There is no evidence of dilated bowel loops or free intraperitoneal air. No radiopaque calculi or other significant radiographic abnormality is seen. Heart size and mediastinal contours are within normal limits. Both lungs are clear. IMPRESSION: Negative abdominal radiographs.  No acute cardiopulmonary disease. Electronically Signed   By: Lajean Manes M.D.   On: 07/27/2016 14:10      Assessment & Plan:  1. Generalized abdominal pain Acute abdominal x-ray negative. Results discussed with patient. Recommended continued use of Miralax and Magnesium citrate for relief of constipation and explained diarrhea may be due to movement of some of the hardened stool. Advised increased water intake and RTC if symptoms are worsening or not resolving. Plan to re-evaluate and DM check next month. - CBC with Differential/Platelet -  DG Abd Acute W/Chest; Future

## 2016-07-27 NOTE — Progress Notes (Signed)
Patient ID: Emily Mathis, female    DOB: 02-19-1966, 50 y.o.   MRN: [id number]  PCP: Porfirio Oar, PA-C  Chief Complaint  Patient presents with  . Follow-up    post hospitalization    Subjective:    HPI Presents for evaluation following recent ED visit.  She went to the ED with abdominal cramping, bloating, N/V and difficulty passing gas. Recall she underwent hysterectomy 06/11/2016 for DUB causing anemia. CT scan revealed large stool burden, and she was prescribed Miralax, randitidine, colace, magnesium citrate and ondansetron.  She continues to have bloating and reports diarrhea for the past 2 days. Intermittently she is also passing hard stool. Continues to have difficulty passing gas, but is able to do so. One episode of BRBPR yesterday. Mild nausea. Eructation. No fever, chills.  Also asks for evaluation of a knot on the LEFT leg. This is chronic, from a remote injury, but started hurting about a week ago. Describes a burning pain, intermittently, that comes on "randomly" without trigger. She notes it more when lying down, and moving her legs alleviates the discomfort.       Review of Systems Constitutional: Negative for appetite change, chills and fever.  HENT: Negative for congestion, ear discharge, ear pain, rhinorrhea and sore throat.   Eyes: Negative for pain, discharge and itching.  Respiratory: Positive for cough.   Gastrointestinal: Positive for abdominal distention, blood in stool, diarrhea, nausea and rectal pain. Negative for abdominal pain, anal bleeding, constipation and vomiting.  Genitourinary: Negative for dysuria, frequency, hematuria and urgency.     Patient Active Problem List   Diagnosis Date Noted  . Status post laparoscopic hysterectomy 06/11/2016  . Iron deficiency anemia 02/15/2016  . Myalgia 02/15/2016  . Upper airway cough syndrome 10/21/2015  . Polyarthralgia 06/16/2015  . Diabetes mellitus type 2, uncomplicated ([facility]) 09/14/2013  .  Keloid scar, cervical incision 09/02/2013  . Reaction, situational, acute, to stress 07/02/2013  . 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 Severe obesity (BMI >= 40) ([facility]) 03/27/2013  . GERD (gastroesophageal reflux disease) 02/15/2013  . Hypocalcemia 01/07/2013  . Chest pain on exertion 07/24/2011     Prior to Admission medications   Medication Sig Start Date End Date Taking? Authorizing Provider  albuterol (PROVENTIL HFA;VENTOLIN HFA) 108 (90 Base) MCG/ACT inhaler Inhale 2 puffs into the lungs daily as needed. Reported on 01/04/2016 03/28/16  Yes Ural Acree Leotis Shames, PA-C  ALPRAZolam (XANAX) 0.5 MG tablet Take 1 tablet (0.5 mg total) by mouth at bedtime as needed. for sleep 07/03/16  Yes Nariyah Osias, PA-C  Canagliflozin-Metformin HCl (INVOKAMET) 150-500 MG TABS Take 1 tablet by mouth daily. 04/25/16  Yes Danylah Holden, PA-C  chlorthalidone (HYGROTON) 25 MG tablet TAKE 1 TABLET(25 MG) BY MOUTH DAILY 03/29/16  Yes Teya Otterson, PA-C  Cholecalciferol (VITAMIN D3) 50000 units CAPS Take 50,000 Units by mouth every Monday.  06/14/16  Yes Historical Provider, MD  diazepam (VALIUM) 5 MG tablet Take 1 tablet (5 mg total) by mouth daily. 07/03/16  Yes Kirtis Challis, PA-C  docusate sodium (COLACE) 250 MG capsule Take 1 capsule (250 mg total) by mouth daily. 07/24/16  Yes Mercedes Camprubi-Soms, PA-C  ferrous sulfate 325 (65 FE) MG tablet Take 325 mg by mouth 3 (three) times daily with meals.   Yes Historical Provider, MD  glycerin adult 2 g suppository Place 1 suppository rectally as needed for constipation.   Yes Historical Provider, MD  ibuprofen (ADVIL,MOTRIN) 800 MG tablet Take 1 tablet (800 mg total) by mouth every 8 (eight)  hours as needed. Patient taking differently: Take 800 mg by mouth every 8 (eight) hours as needed for headache or moderate pain.  06/22/16  Yes Brook E Ardell Isaacs, MD  levothyroxine (SYNTHROID, LEVOTHROID) 300 MCG tablet Take 1 tablet (300 mcg total) by mouth daily. 06/17/15   Yes Zakiah Beckerman, PA-C  lisinopril (PRINIVIL,ZESTRIL) 2.5 MG tablet TAKE 1 TABLET BY MOUTH EVERY DAY Patient taking differently: TAKE 2.5 MG BY MOUTH EVERY DAY 03/29/16  Yes Rendon Howell, PA-C  nystatin (MYCOSTATIN) powder Apply topically 4 (four) times daily. Patient taking differently: Apply topically 4 (four) times daily as needed (For heat rash.).  10/02/15  Yes Shermon Bozzi, PA-C  pantoprazole (PROTONIX) 40 MG tablet TAKE 1 TABLET BY MOUTH TWICE DAILY Patient taking differently: TAKE 40 MG BY MOUTH TWICE DAILY 03/29/16  Yes Therin Vetsch, PA-C  potassium chloride SA (K-DUR,KLOR-CON) 20 MEQ tablet Take 1 tablet (20 mEq total) by mouth 2 (two) times daily. 06/12/16  Yes Brook E Ardell Isaacs, MD  ranitidine (ZANTAC) 150 MG tablet Take 1 tablet (150 mg total) by mouth 2 (two) times daily. 07/24/16  Yes Mercedes Camprubi-Soms, PA-C  sertraline (ZOLOFT) 100 MG tablet TAKE 1 TABLET BY MOUTH EVERY DAY Patient taking differently: TAKE 100 MG BY MOUTH EVERY DAY 07/13/16  Yes Rommie Dunn, PA-C  sucralfate (CARAFATE) 1 G tablet Take 1 tablet (1 g total) by mouth 4 (four) times daily. 04/19/15  Yes Marisa Severin, MD  ondansetron (ZOFRAN ODT) 4 MG disintegrating tablet Take 1 tablet (4 mg total) by mouth every 8 (eight) hours as needed for nausea or vomiting. Patient not taking: Reported on 07/27/2016 07/24/16   Mercedes Camprubi-Soms, PA-C  polyethylene glycol (MIRALAX / GLYCOLAX) packet Take 17 g by mouth 2 (two) times daily. Take 2 packets today (day one), wait 2 hours, if no stool produced then take 2 more packets on day one; then continue taking 2 packets daily until you achieve daily soft stools; if water stool develops then cut back to 1 packet daily. Patient not taking: Reported on 07/27/2016 07/24/16   Mercedes Camprubi-Soms, PA-C     Allergies  Allergen Reactions  . Dilaudid [Hydromorphone Hcl] Itching  . Hydrocodone Itching  . Latex Itching and Other (See Comments)    leaves marks on  skin       Objective:  Physical Exam  Constitutional: She is oriented to person, place, and time. She appears well-developed and well-nourished. She is active and cooperative. No distress.  BP 128/86 (BP Location: Right Arm, Patient Position: Sitting, Cuff Size: Large)   Pulse 86   Temp 98.3 F (36.8 C) (Oral)   Resp 18   Ht 5\' 3"  (1.6 m)   Wt 268 lb 3.2 oz (121.7 kg)   LMP 05/08/2016 (Exact Date)   SpO2 99%   BMI 47.51 kg/m   HENT:  Head: Normocephalic and atraumatic.  Right Ear: Hearing normal.  Left Ear: Hearing normal.  Eyes: Conjunctivae are normal. No scleral icterus.  Neck: Normal range of motion. Neck supple. No thyromegaly present.  Cardiovascular: Normal rate, regular rhythm and normal heart sounds.   Pulses:      Radial pulses are 2+ on the right side, and 2+ on the left side.  Pulmonary/Chest: Effort normal and breath sounds normal.  Abdominal: Soft. Normal appearance and bowel sounds are normal. There is no hepatosplenomegaly. There is tenderness in the right upper quadrant and epigastric area. There is no CVA tenderness.  Lymphadenopathy:  Head (right side): No tonsillar, no preauricular, no posterior auricular and no occipital adenopathy present.       Head (left side): No tonsillar, no preauricular, no posterior auricular and no occipital adenopathy present.    She has no cervical adenopathy.       Right: No supraclavicular adenopathy present.       Left: No supraclavicular adenopathy present.  Neurological: She is alert and oriented to person, place, and time. No sensory deficit.  Skin: Skin is warm, dry and intact. No rash noted. No cyanosis or erythema. Nails show no clubbing.     Psychiatric: She has a normal mood and affect. Her speech is normal and behavior is normal.       Dg Abd Acute W/chest  Result Date: 07/27/2016 CLINICAL DATA:  Yesterday and today diarrhea, some blood in stool last night (1 episode), Nausea. EXAM: DG ABDOMEN ACUTE W/  1V CHEST COMPARISON:  Chest radiograph, 02/20/2016 FINDINGS: There is no evidence of dilated bowel loops or free intraperitoneal air. No radiopaque calculi or other significant radiographic abnormality is seen. Heart size and mediastinal contours are within normal limits. Both lungs are clear. IMPRESSION: Negative abdominal radiographs.  No acute cardiopulmonary disease. Electronically Signed   By: Amie Portland M.D.   On: 07/27/2016 14:10       Assessment & Plan:   1. Generalized abdominal pain Reassuring radiographs. Suspect incomplete resolution of constipation. Encouraged continued hydration, along with continued Miralax and Magnesium citrate as needed. If symptoms worsen, RTC, otherwise RTC as planned next month for routine health maintenance (DM, etc). - CBC with Differential/Platelet - DG Abd Acute W/Chest; Future   Fernande Bras, PA-C Physician Assistant-Certified [facility]

## 2016-07-27 NOTE — Patient Instructions (Addendum)
Continue to hydrate. Use OTC simethicone (Mylanta Gas) for the bloating. Follow-up with me on 12/18, as planned, unless your symptoms worsen.    IF you received an x-ray today, you will receive an invoice from [facility]. Please contact [facility] at [phone] with questions or concerns regarding your invoice.   IF you received labwork today, you will receive an invoice from [employer]. Please contact [facility] at [phone] with questions or concerns regarding your invoice.   Our billing staff will not be able to assist you with questions regarding bills from these companies.  You will be contacted with the lab results as soon as they are available. The fastest way to get your results is to activate your My Chart account. Instructions are located on the last page of this paperwork. If you have not heard from [place] regarding the results in 2 weeks, please contact this office.

## 2016-08-15 ENCOUNTER — Other Ambulatory Visit: Payer: Self-pay | Admitting: Obstetrics and Gynecology

## 2016-08-15 ENCOUNTER — Other Ambulatory Visit: Payer: Self-pay | Admitting: Physician Assistant

## 2016-08-15 DIAGNOSIS — Z0289 Encounter for other administrative examinations: Secondary | ICD-10-CM

## 2016-08-15 NOTE — Telephone Encounter (Signed)
Medication refill request: Ibuprofen 800MG  Last AEX:  02/20/16 (1st visit for Menorrhagia)-BS Next AEX: 02/21/17 BS Last MMG (if hormonal medication request): 02/29/16 [place] Refill authorized: 06/22/16 #30. Rosiland Oz. Please advise. Thank you.

## 2016-08-18 NOTE — Telephone Encounter (Signed)
04/2016 last dm ovlast acute 11/07 with labs

## 2016-08-19 ENCOUNTER — Emergency Department (HOSPITAL_COMMUNITY): Payer: BLUE CROSS/BLUE SHIELD

## 2016-08-19 ENCOUNTER — Encounter (HOSPITAL_COMMUNITY): Payer: Self-pay | Admitting: Emergency Medicine

## 2016-08-19 ENCOUNTER — Inpatient Hospital Stay (HOSPITAL_COMMUNITY)
Admission: EM | Admit: 2016-08-19 | Discharge: 2016-08-22 | DRG: 439 | Disposition: A | Discharge status: Home / Self Care | Payer: BLUE CROSS/BLUE SHIELD | Attending: Family Medicine | Admitting: Family Medicine

## 2016-08-19 DIAGNOSIS — N179 Acute kidney failure, unspecified: Secondary | ICD-10-CM | POA: Diagnosis not present

## 2016-08-19 DIAGNOSIS — D509 Iron deficiency anemia, unspecified: Secondary | ICD-10-CM | POA: Diagnosis present

## 2016-08-19 DIAGNOSIS — F329 Major depressive disorder, single episode, unspecified: Secondary | ICD-10-CM | POA: Diagnosis not present

## 2016-08-19 DIAGNOSIS — I4581 Long QT syndrome: Secondary | ICD-10-CM | POA: Diagnosis present

## 2016-08-19 DIAGNOSIS — R1013 Epigastric pain: Secondary | ICD-10-CM | POA: Diagnosis not present

## 2016-08-19 DIAGNOSIS — F419 Anxiety disorder, unspecified: Secondary | ICD-10-CM | POA: Diagnosis not present

## 2016-08-19 DIAGNOSIS — E890 Postprocedural hypothyroidism: Secondary | ICD-10-CM | POA: Diagnosis not present

## 2016-08-19 DIAGNOSIS — Z7984 Long term (current) use of oral hypoglycemic drugs: Secondary | ICD-10-CM

## 2016-08-19 DIAGNOSIS — Z885 Allergy status to narcotic agent status: Secondary | ICD-10-CM | POA: Diagnosis not present

## 2016-08-19 DIAGNOSIS — Z91040 Latex allergy status: Secondary | ICD-10-CM | POA: Diagnosis not present

## 2016-08-19 DIAGNOSIS — Z825 Family history of asthma and other chronic lower respiratory diseases: Secondary | ICD-10-CM

## 2016-08-19 DIAGNOSIS — E785 Hyperlipidemia, unspecified: Secondary | ICD-10-CM | POA: Diagnosis not present

## 2016-08-19 DIAGNOSIS — R109 Unspecified abdominal pain: Secondary | ICD-10-CM

## 2016-08-19 DIAGNOSIS — K5900 Constipation, unspecified: Secondary | ICD-10-CM | POA: Diagnosis present

## 2016-08-19 DIAGNOSIS — I10 Essential (primary) hypertension: Secondary | ICD-10-CM | POA: Diagnosis present

## 2016-08-19 DIAGNOSIS — K219 Gastro-esophageal reflux disease without esophagitis: Secondary | ICD-10-CM | POA: Diagnosis present

## 2016-08-19 DIAGNOSIS — Z791 Long term (current) use of non-steroidal anti-inflammatories (NSAID): Secondary | ICD-10-CM | POA: Diagnosis not present

## 2016-08-19 DIAGNOSIS — Z79899 Other long term (current) drug therapy: Secondary | ICD-10-CM

## 2016-08-19 DIAGNOSIS — E876 Hypokalemia: Secondary | ICD-10-CM | POA: Diagnosis present

## 2016-08-19 DIAGNOSIS — K861 Other chronic pancreatitis: Secondary | ICD-10-CM | POA: Diagnosis not present

## 2016-08-19 DIAGNOSIS — R1011 Right upper quadrant pain: Secondary | ICD-10-CM | POA: Diagnosis not present

## 2016-08-19 DIAGNOSIS — Z833 Family history of diabetes mellitus: Secondary | ICD-10-CM

## 2016-08-19 DIAGNOSIS — E119 Type 2 diabetes mellitus without complications: Secondary | ICD-10-CM | POA: Diagnosis not present

## 2016-08-19 DIAGNOSIS — K859 Acute pancreatitis without necrosis or infection, unspecified: Secondary | ICD-10-CM | POA: Diagnosis not present

## 2016-08-19 DIAGNOSIS — E039 Hypothyroidism, unspecified: Secondary | ICD-10-CM | POA: Diagnosis not present

## 2016-08-19 DIAGNOSIS — J45909 Unspecified asthma, uncomplicated: Secondary | ICD-10-CM | POA: Diagnosis present

## 2016-08-19 LAB — COMPREHENSIVE METABOLIC PANEL
ALT: 28 U/L (ref 14–54)
AST: 76 U/L — ABNORMAL HIGH (ref 15–41)
Albumin: 3.6 g/dL (ref 3.5–5.0)
Alkaline Phosphatase: 75 U/L (ref 38–126)
Anion gap: 11 (ref 5–15)
BUN: 14 mg/dL (ref 6–20)
CHLORIDE: 96 mmol/L — AB (ref 101–111)
CO2: 27 mmol/L (ref 22–32)
CREATININE: 1.14 mg/dL — AB (ref 0.44–1.00)
Calcium: 8.6 mg/dL — ABNORMAL LOW (ref 8.9–10.3)
GFR calc Af Amer: >60 mL/min (ref >60)
GFR calc non Af Amer: 56 mL/min — ABNORMAL LOW (ref >60)
GLUCOSE: 120 mg/dL — AB (ref 65–99)
Potassium: 5 mmol/L (ref 3.5–5.1)
Sodium: 134 mmol/L — ABNORMAL LOW (ref 135–145)
Total Bilirubin: 1 mg/dL (ref 0.3–1.2)
Total Protein: 8.2 g/dL — ABNORMAL HIGH (ref 6.5–8.1)

## 2016-08-19 LAB — CBC
HCT: 35.7 % — ABNORMAL LOW (ref 36.0–46.0)
Hemoglobin: 11 g/dL — ABNORMAL LOW (ref 12.0–15.0)
MCH: 23.9 pg — AB (ref 26.0–34.0)
MCHC: 30.8 g/dL (ref 30.0–36.0)
MCV: 77.6 fL — AB (ref 78.0–100.0)
PLATELETS: 644 10*3/uL — AB (ref 150–400)
RBC: 4.6 MIL/uL (ref 3.87–5.11)
RDW: 19.4 % — AB (ref 11.5–15.5)
WBC: 12.3 10*3/uL — ABNORMAL HIGH (ref 4.0–10.5)

## 2016-08-19 LAB — TSH: TSH: 80.24 u[IU]/mL — AB (ref 0.350–4.500)

## 2016-08-19 LAB — LIPASE, BLOOD: Lipase: 43 U/L (ref 11–51)

## 2016-08-19 MED ORDER — ENOXAPARIN SODIUM 40 MG/0.4ML ~~LOC~~ SOLN
40.0000 mg | SUBCUTANEOUS | Status: DC
  Administered 2016-08-19 – 2016-08-21 (×3): 40 mg via SUBCUTANEOUS
  Filled 2016-08-19 (×3): qty 0.4

## 2016-08-19 MED ORDER — ONDANSETRON HCL 4 MG/2ML IJ SOLN: 4.0000 mg | Freq: Four times a day (QID) | INTRAMUSCULAR | Status: DC | PRN

## 2016-08-19 MED ORDER — DIPHENHYDRAMINE HCL 50 MG/ML IJ SOLN
12.5000 mg | Freq: Four times a day (QID) | INTRAMUSCULAR | Status: DC | PRN
  Administered 2016-08-19 – 2016-08-21 (×4): 12.5 mg via INTRAVENOUS
  Filled 2016-08-19 (×4): qty 1

## 2016-08-19 MED ORDER — MORPHINE SULFATE (PF) 4 MG/ML IV SOLN
4.0000 mg | Freq: Once | INTRAVENOUS | Status: AC
  Administered 2016-08-19: 4 mg via INTRAVENOUS
  Filled 2016-08-19: qty 1

## 2016-08-19 MED ORDER — PNEUMOCOCCAL VAC POLYVALENT 25 MCG/0.5ML IJ INJ
0.5000 mL | INJECTION | INTRAMUSCULAR | Status: DC
  Filled 2016-08-19: qty 0.5

## 2016-08-19 MED ORDER — IOPAMIDOL (ISOVUE-300) INJECTION 61%
INTRAVENOUS | Status: AC
  Administered 2016-08-19: 100 mL
  Filled 2016-08-19: qty 100

## 2016-08-19 MED ORDER — SODIUM CHLORIDE 0.9 % IV SOLN
INTRAVENOUS | Status: DC
  Administered 2016-08-19 – 2016-08-22 (×7): via INTRAVENOUS

## 2016-08-19 MED ORDER — SODIUM CHLORIDE 0.9 % IV BOLUS (SEPSIS)
1000.0000 mL | Freq: Once | INTRAVENOUS | Status: AC
  Administered 2016-08-19: 1000 mL via INTRAVENOUS

## 2016-08-19 MED ORDER — ONDANSETRON HCL 4 MG/2ML IJ SOLN
4.0000 mg | Freq: Once | INTRAMUSCULAR | Status: DC
  Filled 2016-08-19: qty 2

## 2016-08-19 MED ORDER — ONDANSETRON HCL 4 MG PO TABS: 4.0000 mg | ORAL_TABLET | Freq: Four times a day (QID) | ORAL | Status: DC | PRN

## 2016-08-19 MED ORDER — PANTOPRAZOLE SODIUM 40 MG IV SOLR
40.0000 mg | Freq: Every day | INTRAVENOUS | Status: DC
  Administered 2016-08-19 – 2016-08-21 (×3): 40 mg via INTRAVENOUS
  Filled 2016-08-19 (×3): qty 40

## 2016-08-19 MED ORDER — GI COCKTAIL ~~LOC~~
30.0000 mL | Freq: Once | ORAL | Status: AC
  Administered 2016-08-19: 30 mL via ORAL
  Filled 2016-08-19: qty 30

## 2016-08-19 MED ORDER — HYDROMORPHONE HCL 2 MG/ML IJ SOLN
1.0000 mg | INTRAMUSCULAR | Status: DC | PRN
  Administered 2016-08-19 – 2016-08-21 (×7): 1 mg via INTRAVENOUS
  Filled 2016-08-19 (×7): qty 1

## 2016-08-19 MED ORDER — LORAZEPAM 2 MG/ML IJ SOLN
0.5000 mg | Freq: Every day | INTRAMUSCULAR | Status: DC
  Administered 2016-08-19 – 2016-08-21 (×3): 0.5 mg via INTRAVENOUS
  Filled 2016-08-19 (×3): qty 1

## 2016-08-19 MED ORDER — MORPHINE SULFATE (PF) 4 MG/ML IV SOLN
4.0000 mg | Freq: Once | INTRAVENOUS | Status: AC
Start: 2016-08-19 — End: 2016-08-19
  Administered 2016-08-19: 4 mg via INTRAVENOUS
  Filled 2016-08-19: qty 1

## 2016-08-19 MED ORDER — PROMETHAZINE HCL 25 MG/ML IJ SOLN: 6.2500 mg | Freq: Once | INTRAMUSCULAR | Status: DC

## 2016-08-19 NOTE — ED Triage Notes (Signed)
Pt seen here on the 7th of November. Pt having abdominal pain and c/o of constipation with no good bowel movement. Pt had an episode of vomiting this am. Pt is sobbing in triage

## 2016-08-19 NOTE — ED Notes (Signed)
Patient transported to CT. Spoke with daughter (in person) and sister (by phone) per the patient's request about her plan of care.

## 2016-08-19 NOTE — ED Provider Notes (Signed)
[facility] DEPT Provider Note   CSN: [id number] Arrival date & time: 08/19/16  [id number]     History   Chief Complaint Chief Complaint  Patient presents with  . Abdominal Pain  . Constipation    HPI Emily Mathis is a 50 y.o. female.  50 year old African-American female with a past medical history significant for anemia, DM2,  uterine fibroids s/p robotic assisted hysterectomy on 06/11/16 by Dr. Quincy Simmonds, GERD, HTN, HLD, peripheral edema, hypothyroidism, and additional medical history listed below, the presents to the ED today with epigastric abdominal pain, RUQ pain, constipation, difficulty passing gas, nausea, and vomiting. Patient states that she was seen on 11/7 for same. At that time she was encouraged to follow-up with her primary care doctor for constipation.CT abd pelvis at that time showed CT abd/pelv stranding around head and neck of pancreas c/w duodenitis vs pancreatitis; given that she has a normal lipase this many days after onset, and hx of GERD, duodenitis more likely; also shows prominent stool burden in the ascending and transverse colon, none in the descending colon or rectum, nothing that would be amendable to manual disimpaction. Patient states that the pain has persisted and worsened over the past week. Patient states that the pain is sharp and constant. Nothing makes better or worse. Not associated with food. She also endorses being constipated. She is tried suppositories, prune juice, MiraLAX, Colace without any relief. Patient states that she is not passing gas however she states her last bm was 2 days ago. She denies any melena or hematochezia. Patient denies any emesis but complains of ongoing nausea. Patient states that she does not have much of an appetite. She is taking Tylenol for pain without any relief. Her only abdominal surgeries have been 2 C/S and the hysterectomy. Patient also states that she has GERD and takes a PPI for this. States does not feel like her acid  reflux. Patient denies any fever, chills, headache, vision changes, lightheadedness, dizziness, cough, chest pain, shortness of breath, urinary symptoms, vaginal bleeding, vaginal discharge, numbness.tingling.            Past Medical History:  Diagnosis Date  . Abnormal Pap smear of cervix 1990   Hx cryotherapy to cervix  . Abnormal uterine bleeding   . Anemia 02/20/16   Transfusion of 1 unit pRBCs  . Anxiety   . Arthritis   . Asthma   . 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 Chest pain    per pt due to anemia-   . Diabetes mellitus without complication ([place])   . Fibroid   . Generalized pain   . GERD (gastroesophageal reflux disease)   . Headache(784.0)   . History of blood transfusion 02/20/2016  . Hyperlipidemia   . Hypertension    per Dr. Irven Shelling note 08/30/2011  . Hypothyroidism   . Leg swelling    bilateral  . Neuromuscular disorder ([place])   . Shortness of breath dyspnea    on exertion due to anemia  . Thyroid disease    Hx 3 goiters--hx thyroidectomy--sees Dr. Forde Dandy    Patient Active Problem List   Diagnosis Date Noted  . Status post laparoscopic hysterectomy 06/11/2016  . Iron deficiency anemia 02/15/2016  . Myalgia 02/15/2016  . Upper airway cough syndrome 10/21/2015  . Polyarthralgia 06/16/2015  . Diabetes mellitus type 2, uncomplicated ([place]) [id number]  . Keloid scar, cervical incision 09/02/2013  . Reaction, situational, acute, to stress 07/02/2013  . 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 Severe obesity (BMI >=  40) ([place]) 03/27/2013  . GERD (gastroesophageal reflux disease) 02/15/2013  . Hypocalcemia 01/07/2013  . Chest pain on exertion 07/24/2011    Past Surgical History:  Procedure Laterality Date  . CARDIAC CATHETERIZATION  2013  . CESAREAN SECTION  1995  . CESAREAN SECTION  1996  . COLONOSCOPY    . CYSTOSCOPY N/A 06/11/2016   Procedure: CYSTOSCOPY;  Surgeon: [name], MD;  Location: [facility];  Service: Gynecology;   Laterality: N/A;  . HYSTEROSCOPY W/D&C N/A 04/10/2016   Procedure: HYSTEROSCOPY WITH fractional dilatation and curretage;  Surgeon: [name], MD;  Location: [facility];  Service: Gynecology;  Laterality: N/A;  . LEFT HEART CATHETERIZATION WITH CORONARY ANGIOGRAM  07/24/2011   Procedure: LEFT HEART CATHETERIZATION WITH CORONARY ANGIOGRAM;  Surgeon: Laverda Page, MD;  Location: [facility];  Service: Cardiovascular;;  . ROBOTIC ASSISTED TOTAL HYSTERECTOMY WITH SALPINGECTOMY Bilateral 06/11/2016   Procedure: ROBOTIC ASSISTED TOTAL HYSTERECTOMY WITH SALPINGECTOMY;  Surgeon: [name], MD;  Location: [facility];  Service: Gynecology;  Laterality: Bilateral;  . THYROIDECTOMY N/A 12/18/2012   Procedure: TOTAL THYROIDECTOMY;  Surgeon: Earnstine Regal, MD;  Location: [facility];  Service: General;  Laterality: N/A;  . UPPER GI ENDOSCOPY      OB History    Gravida Para Term Preterm AB Living   3 3 3          SAB TAB Ectopic Multiple Live Births                   Home Medications    Prior to Admission medications   Medication Sig Start Date End Date Taking? Authorizing Provider  albuterol (PROVENTIL HFA;VENTOLIN HFA) 108 (90 Base) MCG/ACT inhaler Inhale 2 puffs into the lungs daily as needed. Reported on 01/04/2016 03/28/16   Harrison Mons, PA-C  ALPRAZolam Duanne Moron) 0.5 MG tablet Take 1 tablet (0.5 mg total) by mouth at bedtime as needed. for sleep 07/03/16   Harrison Mons, PA-C  Canagliflozin-Metformin HCl (INVOKAMET) 150-500 MG TABS Take 1 tablet by mouth daily. 04/25/16   [name] Jeffery, PA-C  chlorthalidone (HYGROTON) 25 MG tablet TAKE 1 TABLET(25 MG) BY MOUTH DAILY 03/29/16   [name] Jeffery, PA-C  Cholecalciferol (VITAMIN D3) 50000 units CAPS Take 50,000 Units by mouth every Monday.  06/14/16   Historical Provider, MD  diazepam (VALIUM) 5 MG tablet Take 1 tablet (5 mg total) by mouth daily. 07/03/16   [name] Jeffery, PA-C  docusate sodium (COLACE) 250 MG capsule Take 1 capsule  (250 mg total) by mouth daily. 07/24/16   Mercedes Camprubi-Soms, PA-C  ferrous sulfate 325 (65 FE) MG tablet Take 325 mg by mouth 3 (three) times daily with meals.    Historical Provider, MD  glycerin adult 2 g suppository Place 1 suppository rectally as needed for constipation.    Historical Provider, MD  ibuprofen (ADVIL,MOTRIN) 800 MG tablet Take 1 tablet (800 mg total) by mouth every 8 (eight) hours as needed. Patient taking differently: Take 800 mg by mouth every 8 (eight) hours as needed for headache or moderate pain.  06/22/16   Dyer, MD  INVOKAMET 150-500 MG TABS TAKE 1 TABLET BY MOUTH DAILY 08/18/16   Harrison Mons, PA-C  levothyroxine (SYNTHROID, LEVOTHROID) 300 MCG tablet Take 1 tablet (300 mcg total) by mouth daily. 06/17/15   [name] Jeffery, PA-C  lisinopril (PRINIVIL,ZESTRIL) 2.5 MG tablet TAKE 1 TABLET BY MOUTH EVERY DAY Patient taking differently: TAKE 2.5 MG BY MOUTH  EVERY DAY 03/29/16   [name] Jeffery, PA-C  nystatin (MYCOSTATIN) powder Apply topically 4 (four) times daily. Patient taking differently: Apply topically 4 (four) times daily as needed (For heat rash.).  10/02/15   [name] Jeffery, PA-C  ondansetron (ZOFRAN ODT) 4 MG disintegrating tablet Take 1 tablet (4 mg total) by mouth every 8 (eight) hours as needed for nausea or vomiting. Patient not taking: Reported on 07/27/2016 07/24/16   Mercedes Camprubi-Soms, PA-C  pantoprazole (PROTONIX) 40 MG tablet TAKE 1 TABLET BY MOUTH TWICE DAILY Patient taking differently: TAKE 40 MG BY MOUTH TWICE DAILY 03/29/16   [name] Jeffery, PA-C  polyethylene glycol (MIRALAX / GLYCOLAX) packet Take 17 g by mouth 2 (two) times daily. Take 2 packets today (day one), wait 2 hours, if no stool produced then take 2 more packets on day one; then continue taking 2 packets daily until you achieve daily soft stools; if water stool develops then cut back to 1 packet daily. Patient not taking: Reported on 07/27/2016 07/24/16   Mercedes  Camprubi-Soms, PA-C  potassium chloride SA (K-DUR,KLOR-CON) 20 MEQ tablet Take 1 tablet (20 mEq total) by mouth 2 (two) times daily. 06/12/16   Brook Oletta Lamas, MD  ranitidine (ZANTAC) 150 MG tablet Take 1 tablet (150 mg total) by mouth 2 (two) times daily. 07/24/16   Mercedes Camprubi-Soms, PA-C  sertraline (ZOLOFT) 100 MG tablet TAKE 1 TABLET BY MOUTH EVERY DAY 08/18/16   Wendie Agreste, MD  sucralfate (CARAFATE) 1 G tablet Take 1 tablet (1 g total) by mouth 4 (four) times daily. 04/19/15   Linton Flemings, MD    Family History Family History  Problem Relation Age of Onset  . Heart disease Mother   . Stroke Mother   . Hypertension Mother   . Heart disease Father   . Colon cancer Father   . Hypertension Father   . Diabetes Father   . Cancer Paternal Grandmother   . Asthma Daughter   . Seizures Daughter   . Hypertension Sister   . Thyroid disease Sister   . Mental illness Daughter 55    suicide attempt 06/2013  . Asthma Sister   . Hypertension Sister   . Breast cancer Paternal Aunt   . Cancer Paternal Aunt     Social History Social History  Substance Use Topics  . Smoking status: Never Smoker  . Smokeless tobacco: Never Used  . Alcohol use 0.0 oz/week     Comment: Occasional glass of wine     Allergies   Dilaudid [hydromorphone hcl]; Hydrocodone; and Latex   Review of Systems Review of Systems  Constitutional: Negative for chills and fever.  HENT: Negative for ear pain and sore throat.   Eyes: Negative for pain and visual disturbance.  Respiratory: Negative for cough and shortness of breath.   Cardiovascular: Negative for chest pain and palpitations.  Gastrointestinal: Positive for abdominal pain (epigastric), constipation, diarrhea, nausea and vomiting. Negative for abdominal distention and blood in stool.  Genitourinary: Negative for dysuria, frequency, hematuria, urgency, vaginal bleeding and vaginal discharge.  Musculoskeletal: Negative for arthralgias and  back pain.  Skin: Negative for color change and rash.  Neurological: Negative for dizziness, syncope, weakness, light-headedness, numbness and headaches.  All other systems reviewed and are negative.    Physical Exam Updated Vital Signs BP 154/97   Pulse 81   Temp 98.9 F (37.2 C) (Oral)   Resp 24   LMP 05/08/2016 (Exact Date)   SpO2 96%   Physical Exam  Constitutional: She is oriented to person, place, and time. She appears well-developed and well-nourished. No distress.  HENT:  Head: Normocephalic and atraumatic.  Mouth/Throat: Uvula is midline and oropharynx is clear and moist. Mucous membranes are not dry.  Eyes: Conjunctivae are normal. Pupils are equal, round, and reactive to light. Right eye exhibits no discharge. Left eye exhibits no discharge. No scleral icterus.  Neck: Normal range of motion. Neck supple. No thyromegaly present.  Cardiovascular: Normal rate, regular rhythm, normal heart sounds and intact distal pulses.  Exam reveals no gallop and no friction rub.   No murmur heard. Pulmonary/Chest: Effort normal and breath sounds normal. No respiratory distress. She has no wheezes.  Abdominal: Soft. She exhibits no distension. Bowel sounds are decreased. There is tenderness in the right upper quadrant and epigastric area. There is positive Murphy's sign. There is no rigidity, no rebound, no guarding and no CVA tenderness.  Musculoskeletal: Normal range of motion.  Moving all four extremities without any difficulties.  Lymphadenopathy:    She has no cervical adenopathy.  Neurological: She is alert and oriented to person, place, and time.  Skin: Skin is warm and dry.  Nursing note and vitals reviewed.    ED Treatments / Results  Labs (all labs ordered are listed, but only abnormal results are displayed) Labs Reviewed  COMPREHENSIVE METABOLIC PANEL - Abnormal; Notable for the following:       Result Value   Sodium 134 (*)    Chloride 96 (*)    Glucose, Bld 120 (*)     Creatinine, Ser 1.14 (*)    Calcium 8.6 (*)    Total Protein 8.2 (*)    AST 76 (*)    GFR calc non Af Amer 56 (*)    All other components within normal limits  CBC - Abnormal; Notable for the following:    WBC 12.3 (*)    Hemoglobin 11.0 (*)    HCT 35.7 (*)    MCV 77.6 (*)    MCH 23.9 (*)    RDW 19.4 (*)    Platelets 644 (*)    All other components within normal limits  TSH - Abnormal; Notable for the following:    TSH 80.240 (*)    All other components within normal limits  LIPASE, BLOOD  URINALYSIS, ROUTINE W REFLEX MICROSCOPIC (NOT AT [facility])  COMPREHENSIVE METABOLIC PANEL  CBC  LIPID PANEL  HEMOGLOBIN A1C    EKG  EKG Interpretation  Date/Time:  Sunday August 19 2016 06:34:32 EST Ventricular Rate:  85 PR Interval:    QRS Duration: 81 QT Interval:  417 QTC Calculation: 496 R Axis:   68 Text Interpretation:  Sinus rhythm Nonspecific repol abnormality, diffuse leads Borderline prolonged QT interval Baseline wander in lead(s) V6 QT prolongation new Confirmed by Dina Rich  MD, COURTNEY ([id number]) on 08/19/2016 7:07:45 AM       Radiology Dg Abdomen 1 View  Result Date: 08/19/2016 CLINICAL DATA:  Epigastric pain. EXAM: ABDOMEN - 1 VIEW COMPARISON:  July 27, 2016 FINDINGS: The bowel gas pattern is normal. No radio-opaque calculi or other significant radiographic abnormality are seen. IMPRESSION: Negative. Electronically Signed   By: Dorise Bullion III M.D   On: 08/19/2016 08:26   US Abdomen Complete  Result Date: 08/19/2016 CLINICAL DATA:  Abdominal and epigastric pain for almost 1 month. EXAM: ABDOMEN ULTRASOUND COMPLETE COMPARISON:  CT, 07/24/2016. FINDINGS: Gallbladder: No gallstones or wall thickening visualized. No sonographic Murphy sign noted by sonographer. Common bile duct: Diameter: 3  mm Liver: Mild increased parenchymal echogenicity suggesting fatty infiltration. No mass or focal lesion. IVC: Not visualized due to midline bowel gas. Pancreas: Not visualized due  to midline bowel gas. Spleen: Size and appearance within normal limits. Right Kidney: Length: 10.8 cm. Echogenicity within normal limits. No mass or hydronephrosis visualized. Left Kidney: Length: 11.6 cm. Echogenicity within normal limits. No mass or hydronephrosis visualized. Abdominal aorta: No aneurysm visualized. Other findings: None. IMPRESSION: 1. No acute findings.  Normal gallbladder.  No bile duct dilation. 2. Mild increased echogenicity of the liver is consistent with hepatic steatosis. 3. No other abnormalities. Midline structures including the pancreas were obscured by bowel gas. Electronically Signed   By: Lajean Manes M.D.   On: 08/19/2016 11:12   Ct Abdomen Pelvis W Contrast  Result Date: 08/19/2016 CLINICAL DATA:  Abdominal pain and constipation for several weeks. EXAM: CT ABDOMEN AND PELVIS WITH CONTRAST TECHNIQUE: Multidetector CT imaging of the abdomen and pelvis was performed using the standard protocol following bolus administration of intravenous contrast. CONTRAST:  1 ISOVUE-300 IOPAMIDOL (ISOVUE-300) INJECTION 61% COMPARISON:  07/24/2016 FINDINGS: Lower chest: No acute abnormality. Hepatobiliary: No focal liver abnormality is seen. No gallstones, gallbladder wall thickening, or biliary dilatation. Pancreas: There are mild inflammatory changes centered on the inferior pancreatic head and uncinate process similar to the prior exam. Remainder the pancreas is unremarkable. There is uniform pancreatic enhancement. Spleen: Normal in size without focal abnormality. Adrenals/Urinary Tract: Adrenal glands are unremarkable. Kidneys are normal, without renal calculi, focal lesion, or hydronephrosis. Bladder is unremarkable. Stomach/Bowel: Stomach is within normal limits. Appendix appears normal. No evidence of bowel wall thickening, distention, or inflammatory changes. Vascular/Lymphatic: There several prominent peripancreatic and gastrohepatic ligament lymph nodes none pathologically enlarged by  size criteria, all stable. No significant vascular abnormality. Reproductive: Status post hysterectomy. No adnexal masses. Other: No abdominal wall hernia or abnormality. No abdominopelvic ascites. Musculoskeletal: No acute or significant osseous findings. IMPRESSION: 1. Findings are consistent with mild focal pancreatitis. There is no evidence of pancreatic necrosis or pseudocyst. Patent portal, splenic and superior mesenteric veins. These findings are without significant change from the prior CT. 2. No other acute abnormalities. Electronically Signed   By: Lajean Manes M.D.   On: 08/19/2016 13:07    Procedures Procedures (including critical care time)  Medications Ordered in ED Medications  morphine 4 MG/ML injection 4 mg (4 mg Intravenous Given 08/19/16 0742)     Initial Impression / Assessment and Plan / ED Course  I have reviewed the triage vital signs and the nursing notes.  Pertinent labs & imaging results that were available during my care of the patient were reviewed by me and considered in my medical decision making (see chart for details).  Clinical Course   Patient presents to the ED with epigastric pain and RUQ pain that has been present for the past month and increasing in severity over the past week. KUB was ordered without acute changes. Patient was given 4mg  without any relief. Abd [place] was performed that showed no cholecystitis. She was given GI cocktail and an additional 4mg  of morphine without any relif in pain. Patient continued to complain of 9.10 epigastric pain and tender to palpation. Patient denied any nausea. Zofran held given patient prolonged QT. CT abd was ordered given persistent pain. CT scan showed mild inflammatory changes seen in the  inferior pancreatic head and uncinate process, consistent with mild focal pancreatitis.  No signs of pancreatic necrosis or pseudocyst.  Same findings seen on CT  last month, without any sig change. Mild leukocytosis of 12 noted. Lipase  and LFTs are wnl. Unlikely gallstone pancreatitis given normal gallbladder US. No sign alcohol use. Patient given a total of 12 mg of morphine with continued pain. Able to tolerate po fluisd. Given liter of fluid bolus. Patient is non toxic appearing. She is afebrile and not tachycardic. All other labs are unremarkable. Creatine is stable and down from previous creatine. Hgb is stable and elevated from prior value. Given continued pain consulted teaching service to admit patient. Dicussed patient with Dr. Gerarda Fraction who agrees to admit patient. Patient is agreeable to the plan.     Final Clinical Impressions(s) / ED Diagnoses   Final diagnoses:  Chronic pancreatitis, unspecified pancreatitis type [facility])    New Prescriptions New Prescriptions   No medications on file     Doristine Devoid, PA-C 08/19/16 [id number]    Fredia Sorrow, MD 08/22/16 [phone]

## 2016-08-19 NOTE — H&P (Signed)
[facility] Admission History and Physical Service Pager: [phone]  Patient name: Emily Mathis Medical record number: [id number] Date of birth: 1966-08-05 Age: 50 y.o. Gender: female  Primary Care Provider: Harrison Mons, PA-C Consultants: None Code Status: Full  Chief Complaint: abdominal pain, nausea  Assessment and Plan: Emily Mathis is a 50 y.o. female presenting with epigastric pain and nausea . PMH is significant for anxiety,T2DM, pancreatitis, HTN, HLD, GERD, hypothyroidism, asthma.   #Pancreatitis: Patient noting epigastric pain radiating to her back that has been present for a month and increasingly severe for past week.  Pain rated 9 out of 10 and was given 12 mg of morphine and GI cocktail in ED without much relief.  CT scan showed mild inflammatory changes seen in the  inferior pancreatic head and uncinate process, consistent with mild focal pancreatitis.  No signs of pancreatic necrosis or pseudocyst.  Same findings seen on CT last month, when patient presented for similar picture.  Mild leukocytosis to 12, lipase normal and LFTs within normal limits.  Unlikely to be gallstone pancreatitis with normal LFTs and normal RUQ [place].  Reports occassional glass of wine, but no significant alcohol use.  Ca 8.3, lipid panel in 2016 wnl, A1C in 5/17 was 6.9.  Patient does take Canagliflozin, which has been reported in case reports to cause pancreatitis.  However, this is not a new medication for her.  - admit to FPTS under Dr. Mingo Amber  - holding canagliflozin - NPO and advance diet as tolerated - NS @ 200cc/hr - dilauded 1mg  q3hrs PRN pain - zofran 4mg  IV q6hrs PRN nausea - f/u repeat lipid panel  #AKI. Baseline .7-.9 now 1.14 on admission. Most liekly prerenal in setting of decreased PO intake.  -monitor with daily BMPs -avoid nephrotoxic mediations -IVF as above  #GERD:  Takes ranitidine 150mg  BID and protonix 40mg  BID at home.   - cont protonix IV  QHS  #Hypothyroid:  Last TSH was 0.75 on 6/17.  Takes 322mcg synthroid daily.   - Repeat TSH  - Start 131mcg IV if patient's still PO in AM, otherwise cont 37mcg  #T2DM: Last A1C in 5/17 was 6.9. Takes invokamet (canagliflozin/metformin combo) - hold invokamet - follow CBGs q4hrs while NPO - repeat A1c - considering adding SSI when taking PO - may need d5 added to fluids  #HTN. Normotensive on admission. Takes lisinopril and chlorthalidone at home. -hold BP medication while NPO -will add a prn IV BP medication as needed  #Iron deficiency anemia:  hgb 11 upon admission.  Baseline is 9-11.  Patient denies any SOB, CP or dizziness.  - AM CBC -monitor -takes iron TID at home; holding  #Anxiety/Depression:  Stable. Takes xanax 0.5mg  at bedtime for sleep and 5mg  valium daily.  - ativan 0.5mg  IV QHS -holding zoloft while NPO  FEN/GI: NPO/ protonix Prophylaxis: lovenox  Disposition: admit to inpatient under FPTS.   History of Present Illness:  Emily Mathis is a 50 y.o. female presenting with epigastric and RUQ pain radiating to her back. Pain started on the 7th of November and was seen in ED and found to be constipated and have a mild pancreatitis on imaging. Went to see primary doctor who recommended metamucil and last BM was day before yesterday.  Notes severe pain since last Friday.  Pain is sharp and constant and is not relieved by anything. She couldn't take the pain anymore and came in today for evaluation.  Endorses continued nausea, without any episodes of emesis.  Denies any chest pain, shortness of breath, chills or fever.  Reports taking some Tylenol for pain, without much relief.   Review Of Systems: Per HPI with the following additions:  Review of Systems  Constitutional: Positive for chills. Negative for fever.  Respiratory: Positive for shortness of breath.   Cardiovascular: Positive for chest pain.  Gastrointestinal: Positive for abdominal pain and nausea. Negative for  vomiting.  Genitourinary: Negative for dysuria.    Patient Active Problem List   Diagnosis Date Noted  . Pancreatitis 08/19/2016  . Status post laparoscopic hysterectomy 06/11/2016  . Iron deficiency anemia 02/15/2016  . Myalgia 02/15/2016  . Upper airway cough syndrome 10/21/2015  . Polyarthralgia 06/16/2015  . Diabetes mellitus type 2, uncomplicated ([place]) [id number]  . Keloid scar, cervical incision 09/02/2013  . Reaction, situational, acute, to stress 07/02/2013  . 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 Severe obesity (BMI >= 40) ([place]) 03/27/2013  . GERD (gastroesophageal reflux disease) 02/15/2013  . Hypocalcemia 01/07/2013  . Chest pain on exertion 07/24/2011    Past Medical History: Past Medical History:  Diagnosis Date  . Abnormal Pap smear of cervix 1990   Hx cryotherapy to cervix  . Abnormal uterine bleeding   . Anemia 02/20/16   Transfusion of 1 unit pRBCs  . Anxiety   . Arthritis   . Asthma   . 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 Chest pain    per pt due to anemia-   . Diabetes mellitus without complication ([place])   . Fibroid   . Generalized pain   . GERD (gastroesophageal reflux disease)   . Headache(784.0)   . History of blood transfusion 02/20/2016  . Hyperlipidemia   . Hypertension    per Dr. Irven Shelling note 08/30/2011  . Hypothyroidism   . Leg swelling    bilateral  . Neuromuscular disorder ([place])   . Shortness of breath dyspnea    on exertion due to anemia  . Thyroid disease    Hx 3 goiters--hx thyroidectomy--sees Dr. Forde Dandy    Past Surgical History: Past Surgical History:  Procedure Laterality Date  . CARDIAC CATHETERIZATION  2013  . CESAREAN SECTION  1995  . CESAREAN SECTION  1996  . COLONOSCOPY    . CYSTOSCOPY N/A 06/11/2016   Procedure: CYSTOSCOPY;  Surgeon: [name], MD;  Location: [facility];  Service: Gynecology;  Laterality: N/A;  . HYSTEROSCOPY W/D&C N/A 04/10/2016   Procedure: HYSTEROSCOPY WITH fractional dilatation  and curretage;  Surgeon: [name], MD;  Location: [facility];  Service: Gynecology;  Laterality: N/A;  . LEFT HEART CATHETERIZATION WITH CORONARY ANGIOGRAM  07/24/2011   Procedure: LEFT HEART CATHETERIZATION WITH CORONARY ANGIOGRAM;  Surgeon: Laverda Page, MD;  Location[facility];  Service: Cardiovascular;;  . ROBOTIC ASSISTED TOTAL HYSTERECTOMY WITH SALPINGECTOMY Bilateral 06/11/2016   Procedure: ROBOTIC ASSISTED TOTAL HYSTERECTOMY WITH SALPINGECTOMY;  Surgeon: [name], MD;  Location: [facility];  Service: Gynecology;  Laterality: Bilateral;  . THYROIDECTOMY N/A 12/18/2012   Procedure: TOTAL THYROIDECTOMY;  Surgeon: Earnstine Regal, MD;  Location: [facility];  Service: General;  Laterality: N/A;  . UPPER GI ENDOSCOPY      Social History: Social History  Substance Use Topics  . Smoking status: Never Smoker  . Smokeless tobacco: Never Used  . Alcohol use 0.0 oz/week     Comment: Occasional glass of wine   Please also refer to relevant sections of EMR.  Family History: Family History  Problem  Relation Age of Onset  . Heart disease Mother   . Stroke Mother   . Hypertension Mother   . Heart disease Father   . Colon cancer Father   . Hypertension Father   . Diabetes Father   . Cancer Paternal Grandmother   . Asthma Daughter   . Seizures Daughter   . Hypertension Sister   . Thyroid disease Sister   . Mental illness Daughter 69    suicide attempt 06/2013  . Asthma Sister   . Hypertension Sister   . Breast cancer Paternal Aunt   . Cancer Paternal Aunt     Allergies and Medications: Allergies  Allergen Reactions  . Dilaudid [Hydromorphone Hcl] Itching  . Hydrocodone Itching  . Latex Itching and Other (See Comments)    leaves marks on skin   No current facility-administered medications on file prior to encounter.    Current Outpatient Prescriptions on File Prior to Encounter  Medication Sig Dispense Refill  . albuterol (PROVENTIL HFA;VENTOLIN HFA)  108 (90 Base) MCG/ACT inhaler Inhale 2 puffs into the lungs daily as needed. Reported on 01/04/2016 18 g 1  . ALPRAZolam (XANAX) 0.5 MG tablet Take 1 tablet (0.5 mg total) by mouth at bedtime as needed. for sleep 30 tablet 0  . Canagliflozin-Metformin HCl (INVOKAMET) 150-500 MG TABS Take 1 tablet by mouth daily. 90 tablet 0  . chlorthalidone (HYGROTON) 25 MG tablet TAKE 1 TABLET(25 MG) BY MOUTH DAILY 90 tablet 1  . Cholecalciferol (VITAMIN D3) 50000 units CAPS Take 50,000 Units by mouth every Monday.   3  . diazepam (VALIUM) 5 MG tablet Take 1 tablet (5 mg total) by mouth daily. 30 tablet 0  . docusate sodium (COLACE) 250 MG capsule Take 1 capsule (250 mg total) by mouth daily. 30 capsule 0  . ferrous sulfate 325 (65 FE) MG tablet Take 325 mg by mouth 3 (three) times daily with meals.    Marland Kitchen glycerin adult 2 g suppository Place 1 suppository rectally as needed for constipation.    Marland Kitchen ibuprofen (ADVIL,MOTRIN) 800 MG tablet Take 1 tablet (800 mg total) by mouth every 8 (eight) hours as needed. (Patient taking differently: Take 800 mg by mouth every 8 (eight) hours as needed for headache or moderate pain. ) 30 tablet 0  . levothyroxine (SYNTHROID, LEVOTHROID) 300 MCG tablet Take 1 tablet (300 mcg total) by mouth daily. 90 tablet 3  . lisinopril (PRINIVIL,ZESTRIL) 2.5 MG tablet TAKE 1 TABLET BY MOUTH EVERY DAY (Patient taking differently: TAKE 2.5 MG BY MOUTH EVERY DAY) 90 tablet 1  . nystatin (MYCOSTATIN) powder Apply topically 4 (four) times daily. (Patient taking differently: Apply topically 4 (four) times daily as needed (For heat rash.). ) 60 g 2  . ondansetron (ZOFRAN ODT) 4 MG disintegrating tablet Take 1 tablet (4 mg total) by mouth every 8 (eight) hours as needed for nausea or vomiting. 15 tablet 0  . pantoprazole (PROTONIX) 40 MG tablet TAKE 1 TABLET BY MOUTH TWICE DAILY (Patient taking differently: TAKE 40 MG BY MOUTH TWICE DAILY) 180 tablet 1  . polyethylene glycol (MIRALAX / GLYCOLAX) packet Take  17 g by mouth 2 (two) times daily. Take 2 packets today (day one), wait 2 hours, if no stool produced then take 2 more packets on day one; then continue taking 2 packets daily until you achieve daily soft stools; if water stool develops then cut back to 1 packet daily. 14 each 0  . potassium chloride SA (K-DUR,KLOR-CON) 20 MEQ tablet Take 1  tablet (20 mEq total) by mouth 2 (two) times daily. 60 tablet 0  . ranitidine (ZANTAC) 150 MG tablet Take 1 tablet (150 mg total) by mouth 2 (two) times daily. 30 tablet 0  . sertraline (ZOLOFT) 100 MG tablet TAKE 1 TABLET BY MOUTH EVERY DAY 30 tablet 1  . sucralfate (CARAFATE) 1 G tablet Take 1 tablet (1 g total) by mouth 4 (four) times daily. 30 tablet 0  . INVOKAMET 150-500 MG TABS TAKE 1 TABLET BY MOUTH DAILY (Patient not taking: Reported on 08/19/2016) 30 tablet 1    Objective: BP 146/89   Pulse 67   Temp 98.9 F (37.2 C) (Oral)   Resp 13   LMP 05/08/2016 (Exact Date)   SpO2 96%  Exam: General: 50 year old female leaning over bed appearing uncomfortable, acute distress Eyes: Noninjected no drainage ENTM: Moist mucous membranes Neck: Supple no lymphadenopathy Cardiovascular: Regular rate and rhythm, S1-S2 present, no murmurs rubs or gallops Respiratory: CTAB, poor air movement, decreased breath sounds. No wheezing or crackles appreciated. normal work of breathing.  Gastrointestinal: Abdomen revealed well-healed trocar scars, epigastrium and right upper quadrant soft and mildly tender to palpation with negative Murphy's sign, palpable liver edge. No peritoneal signs and no guarding. Hypoactive bowel sounds appreciated. MSK: No deformities or edema. Back with several tender points. Derm: Warm and dry, no visible rashes Neuro: No focal neurological deficits, alert and oriented, strength intact Psych: Normal mood and affect  Labs and Imaging: Results for orders placed or performed during the hospital encounter of 08/19/16 (from the past 24 hour(s))   Lipase, blood     Status: None   Collection Time: 08/19/16  6:43 AM  Result Value Ref Range   Lipase 43 11 - 51 U/L  Comprehensive metabolic panel     Status: Abnormal   Collection Time: 08/19/16  6:43 AM  Result Value Ref Range   Sodium 134 (L) 135 - 145 mmol/L   Potassium 5.0 3.5 - 5.1 mmol/L   Chloride 96 (L) 101 - 111 mmol/L   CO2 27 22 - 32 mmol/L   Glucose, Bld 120 (H) 65 - 99 mg/dL   BUN 14 6 - 20 mg/dL   Creatinine, Ser 1.14 (H) 0.44 - 1.00 mg/dL   Calcium 8.6 (L) 8.9 - 10.3 mg/dL   Total Protein 8.2 (H) 6.5 - 8.1 g/dL   Albumin 3.6 3.5 - 5.0 g/dL   AST 76 (H) 15 - 41 U/L   ALT 28 14 - 54 U/L   Alkaline Phosphatase 75 38 - 126 U/L   Total Bilirubin 1.0 0.3 - 1.2 mg/dL   GFR calc non Af Amer 56 (L) >60 mL/min   GFR calc Af Amer >60 >60 mL/min   Anion gap 11 5 - 15  CBC     Status: Abnormal   Collection Time: 08/19/16  6:43 AM  Result Value Ref Range   WBC 12.3 (H) 4.0 - 10.5 K/uL   RBC 4.60 3.87 - 5.11 MIL/uL   Hemoglobin 11.0 (L) 12.0 - 15.0 g/dL   HCT 35.7 (L) 36.0 - 46.0 %   MCV 77.6 (L) 78.0 - 100.0 fL   MCH 23.9 (L) 26.0 - 34.0 pg   MCHC 30.8 30.0 - 36.0 g/dL   RDW 19.4 (H) 11.5 - 15.5 %   Platelets 644 (H) 150 - 400 K/uL   Dg Abdomen 1 View  Result Date: 08/19/2016 CLINICAL DATA:  Epigastric pain. EXAM: ABDOMEN - 1 VIEW COMPARISON:  July 27, 2016 FINDINGS:  The bowel gas pattern is normal. No radio-opaque calculi or other significant radiographic abnormality are seen. IMPRESSION: Negative. Electronically Signed   By: Dorise Bullion III M.D   On: 08/19/2016 08:26   US Abdomen Complete  Result Date: 08/19/2016 CLINICAL DATA:  Abdominal and epigastric pain for almost 1 month. EXAM: ABDOMEN ULTRASOUND COMPLETE COMPARISON:  CT, 07/24/2016. FINDINGS: Gallbladder: No gallstones or wall thickening visualized. No sonographic Murphy sign noted by sonographer. Common bile duct: Diameter: 3 mm Liver: Mild increased parenchymal echogenicity suggesting fatty  infiltration. No mass or focal lesion. IVC: Not visualized due to midline bowel gas. Pancreas: Not visualized due to midline bowel gas. Spleen: Size and appearance within normal limits. Right Kidney: Length: 10.8 cm. Echogenicity within normal limits. No mass or hydronephrosis visualized. Left Kidney: Length: 11.6 cm. Echogenicity within normal limits. No mass or hydronephrosis visualized. Abdominal aorta: No aneurysm visualized. Other findings: None. IMPRESSION: 1. No acute findings.  Normal gallbladder.  No bile duct dilation. 2. Mild increased echogenicity of the liver is consistent with hepatic steatosis. 3. No other abnormalities. Midline structures including the pancreas were obscured by bowel gas. Electronically Signed   By: Lajean Manes M.D.   On: 08/19/2016 11:12   Ct Abdomen Pelvis W Contrast  Result Date: 08/19/2016 CLINICAL DATA:  Abdominal pain and constipation for several weeks. EXAM: CT ABDOMEN AND PELVIS WITH CONTRAST TECHNIQUE: Multidetector CT imaging of the abdomen and pelvis was performed using the standard protocol following bolus administration of intravenous contrast. CONTRAST:  1 ISOVUE-300 IOPAMIDOL (ISOVUE-300) INJECTION 61% COMPARISON:  07/24/2016 FINDINGS: Lower chest: No acute abnormality. Hepatobiliary: No focal liver abnormality is seen. No gallstones, gallbladder wall thickening, or biliary dilatation. Pancreas: There are mild inflammatory changes centered on the inferior pancreatic head and uncinate process similar to the prior exam. Remainder the pancreas is unremarkable. There is uniform pancreatic enhancement. Spleen: Normal in size without focal abnormality. Adrenals/Urinary Tract: Adrenal glands are unremarkable. Kidneys are normal, without renal calculi, focal lesion, or hydronephrosis. Bladder is unremarkable. Stomach/Bowel: Stomach is within normal limits. Appendix appears normal. No evidence of bowel wall thickening, distention, or inflammatory changes.  Vascular/Lymphatic: There several prominent peripancreatic and gastrohepatic ligament lymph nodes none pathologically enlarged by size criteria, all stable. No significant vascular abnormality. Reproductive: Status post hysterectomy. No adnexal masses. Other: No abdominal wall hernia or abnormality. No abdominopelvic ascites. Musculoskeletal: No acute or significant osseous findings. IMPRESSION: 1. Findings are consistent with mild focal pancreatitis. There is no evidence of pancreatic necrosis or pseudocyst. Patent portal, splenic and superior mesenteric veins. These findings are without significant change from the prior CT. 2. No other acute abnormalities. Electronically Signed   By: Lajean Manes M.D.   On: 08/19/2016 13:07    Eloise Levels, MD 08/19/2016, 3:48 PM PGY-1, [place] Intern pager: [phone], text pages welcome  FPTS Upper-Level Resident Addendum  I have independently interviewed and examined the patient. I have discussed the above with the original author and agree with their documentation. My edits for correction/addition/clarification are in pink. Please see also any attending notes.   Katheren Shams, DO PGY-3, [place] Service pager: [phone] (text pages welcome through [facility])

## 2016-08-20 ENCOUNTER — Inpatient Hospital Stay (HOSPITAL_COMMUNITY): Payer: BLUE CROSS/BLUE SHIELD

## 2016-08-20 LAB — CBC
HCT: 35.3 % — ABNORMAL LOW (ref 36.0–46.0)
HEMOGLOBIN: 10.4 g/dL — AB (ref 12.0–15.0)
MCH: 23.6 pg — AB (ref 26.0–34.0)
MCHC: 29.5 g/dL — AB (ref 30.0–36.0)
MCV: 80.2 fL (ref 78.0–100.0)
PLATELETS: 464 10*3/uL — AB (ref 150–400)
RBC: 4.4 MIL/uL (ref 3.87–5.11)
RDW: 19.2 % — AB (ref 11.5–15.5)
WBC: 7.4 10*3/uL (ref 4.0–10.5)

## 2016-08-20 LAB — LIPID PANEL
CHOLESTEROL: 227 mg/dL — AB (ref 0–200)
HDL: 72 mg/dL (ref >40)
LDL Cholesterol: 138 mg/dL — ABNORMAL HIGH (ref 0–99)
TRIGLYCERIDES: 87 mg/dL (ref <150)
Total CHOL/HDL Ratio: 3.2 RATIO
VLDL: 17 mg/dL (ref 0–40)

## 2016-08-20 LAB — COMPREHENSIVE METABOLIC PANEL
ALBUMIN: 3.3 g/dL — AB (ref 3.5–5.0)
ALK PHOS: 64 U/L (ref 38–126)
ALT: 13 U/L — AB (ref 14–54)
ANION GAP: 8 (ref 5–15)
AST: 27 U/L (ref 15–41)
BILIRUBIN TOTAL: 0.3 mg/dL (ref 0.3–1.2)
BUN: 9 mg/dL (ref 6–20)
CALCIUM: 7.3 mg/dL — AB (ref 8.9–10.3)
CO2: 26 mmol/L (ref 22–32)
CREATININE: 0.94 mg/dL (ref 0.44–1.00)
Chloride: 100 mmol/L — ABNORMAL LOW (ref 101–111)
GFR calc non Af Amer: >60 mL/min (ref >60)
GLUCOSE: 108 mg/dL — AB (ref 65–99)
Potassium: 3.3 mmol/L — ABNORMAL LOW (ref 3.5–5.1)
SODIUM: 134 mmol/L — AB (ref 135–145)
TOTAL PROTEIN: 7.6 g/dL (ref 6.5–8.1)

## 2016-08-20 LAB — URINALYSIS, ROUTINE W REFLEX MICROSCOPIC
Bilirubin Urine: NEGATIVE
GLUCOSE, UA: 500 mg/dL — AB
Hgb urine dipstick: NEGATIVE
Ketones, ur: NEGATIVE mg/dL
LEUKOCYTES UA: NEGATIVE
Nitrite: NEGATIVE
PH: 5.5 (ref 5.0–8.0)
PROTEIN: NEGATIVE mg/dL
SPECIFIC GRAVITY, URINE: 1.017 (ref 1.005–1.030)

## 2016-08-20 LAB — T4, FREE: Free T4: 0.1 ng/dL — ABNORMAL LOW (ref 0.61–1.12)

## 2016-08-20 LAB — GLUCOSE, CAPILLARY
GLUCOSE-CAPILLARY: 102 mg/dL — AB (ref 65–99)
GLUCOSE-CAPILLARY: 118 mg/dL — AB (ref 65–99)
GLUCOSE-CAPILLARY: 84 mg/dL (ref 65–99)
Glucose-Capillary: 88 mg/dL (ref 65–99)
Glucose-Capillary: 93 mg/dL (ref 65–99)

## 2016-08-20 MED ORDER — DOCUSATE SODIUM 100 MG PO CAPS
100.0000 mg | ORAL_CAPSULE | Freq: Two times a day (BID) | ORAL | Status: DC
  Administered 2016-08-20 – 2016-08-22 (×5): 100 mg via ORAL
  Filled 2016-08-20 (×6): qty 1

## 2016-08-20 MED ORDER — SENNA 8.6 MG PO TABS
1.0000 | ORAL_TABLET | Freq: Every day | ORAL | Status: DC | PRN
  Filled 2016-08-20: qty 1

## 2016-08-20 MED ORDER — POTASSIUM CHLORIDE CRYS ER 20 MEQ PO TBCR
40.0000 meq | EXTENDED_RELEASE_TABLET | Freq: Once | ORAL | Status: AC
  Administered 2016-08-20: 40 meq via ORAL
  Filled 2016-08-20: qty 2

## 2016-08-20 MED ORDER — MILK AND MOLASSES ENEMA
1.0000 | Freq: Once | RECTAL | Status: AC
  Administered 2016-08-20: 250 mL via RECTAL
  Filled 2016-08-20: qty 250

## 2016-08-20 MED ORDER — LEVOTHYROXINE SODIUM 100 MCG PO TABS
300.0000 ug | ORAL_TABLET | Freq: Every day | ORAL | Status: DC
  Administered 2016-08-20 – 2016-08-22 (×3): 300 ug via ORAL
  Filled 2016-08-20 (×4): qty 3

## 2016-08-20 MED ORDER — DOCUSATE SODIUM 100 MG PO CAPS: 100.0000 mg | ORAL_CAPSULE | Freq: Two times a day (BID) | ORAL | Status: DC | PRN

## 2016-08-20 MED ORDER — POLYETHYLENE GLYCOL 3350 17 G PO PACK
17.0000 g | PACK | Freq: Every day | ORAL | Status: DC
  Administered 2016-08-20: 17 g via ORAL
  Filled 2016-08-20: qty 1

## 2016-08-20 NOTE — Progress Notes (Signed)
Family Medicine Teaching Service Daily Progress Note Intern Pager: [phone]  Patient name: Emily Mathis Medical record number: [id number] Date of birth: 1966-05-18 Age: 50 y.o. Gender: female  Primary Care Provider: Harrison Mons, PA-C Consultants: None Code Status: Full code  Pt Overview and Major Events to Date:  Emily Mathis is a 50 y.o. female presenting with epigastric pain and nausea . PMH is significant for anxiety,T2DM, pancreatitis, HTN, HLD, GERD, hypothyroidism, asthma.   #Abdominal pain: Patient noting epigastric pain radiating to her back that has been present for a month and increasingly severe for past week. CT scan showed mild inflammatory changes seen in the  inferior pancreatic head and uncinate process, consistent with mild focal pancreatitis.  Mild leukocytosis to 12, lipase normal and LFTs within normal limits.  Unlikely to be gallstone pancreatitis with normal LFTs and normal RUQ [place].  Reports occassional glass of wine, but no significant alcohol use.  Ca 8.3, lipid panel in 2016 wnl, A1C in 5/17 was 6.9.  Patient also notes history of constipation and abd XR showed large stool burden.  Lipid panel: cholesterol 227, LDL 138.  Patient also found to have TSH level of 80 in the setting of hypothyroidism.  Very likely that her uncontrolled hypothyroidism is contributing largely to her symptoms.  - starting bowel regimen: colace 100mg  BID, Senna 1 tab daily PRN, milk of molasses enema - holding canagliflozin - advanced diet to clear liquid and advance as tolerated - dilauded 1mg  q3hrs PRN pain transition to PO meds as tolerated - zofran 4mg  IV q6hrs PRN nausea  #Constipation: Found to have large stool burden on imaging and  -Colace 100mg  BID  -MOM enema -Senna 1 tab daily PRN  #Hyperlipidemia: ASCVD risk 8.5% with LDL 138.  Appropriate to start high intensity statin.  Will discuss with patient during this admission.  - consider starting high intensity statin upon  discharge  #AKI, resolved: Baseline .7-.9 now 1.14 on admission. Most liekly prerenal in setting of decreased PO intake. Improved to 0.94 this morning.  -monitor with daily BMPs -avoid nephrotoxic mediations -IVF as above  #GERD:  Takes ranitidine 150mg  BID and protonix 40mg  BID at home.   - cont protonix IV QHS  #Hypothyroid:  Last TSH was 0.75 on 6/17.  Takes 342mcg synthroid daily.  Repeat TSH was 80.  Patient reports taking medicine 2-3x/week.  - added on T3/T4 to previous collection - started 317mcg synthroid daily - recheck in 6-8 weeks  #T2DM: Last A1C in 5/17 was 6.9. Takes invokamet (canagliflozin/metformin combo) - holding invokamet - follow CBGs q4hrs while NPO - repeat A1c - considering adding SSI when taking PO - may need d5 added to fluids  #HTN: Normotensive on admission. Takes lisinopril and chlorthalidone at home. -consider restarting BP meds as diet advances; as appropriate  #Iron deficiency anemia:  hgb 11 upon admission.  Baseline is 9-11.  Patient denies any SOB, CP or dizziness.  - AM CBC -monitor -takes iron TID at home; holding  #Anxiety/Depression:  Stable. Takes xanax 0.5mg  at bedtime for sleep and 5mg  valium daily.  - ativan 0.5mg  IV QHS -holding zoloft while NPO  FEN/GI: NPO/ protonix Prophylaxis: lovenox Disposition: discharge home when stable  Subjective:  Patient feels well this morning, but notes that she just received pain medication.  She denies any CP, SOB, nausea, vomiting or diarrhea.  Has not had a BM in a few days now.  Not passing gas, but does not feel bloated.   Objective: Temp:  [97.7 F (  36.5 C)-97.8 F (36.6 C)] 97.7 F (36.5 C) (12/04 0545) Pulse Rate:  [63-81] 63 (12/04 0545) Resp:  [12-24] 13 (12/03 1345) BP: (106-154)/(66-100) 141/96 (12/04 0545) SpO2:  [94 %-100 %] 100 % (12/04 0545) Physical Exam: General: 50 year old female sitting in bed looking at her phone appearing comfortable ENTM: Moist mucous  membranes Cardiovascular: Regular rate and rhythm, S1-S2 present, no murmurs rubs or gallops Respiratory: CTAB, good air movement. No wheezing or crackles appreciated. normal work of breathing.  Gastrointestinal: Abdomen revealed well-healed trocar scars, epigastrium and right upper quadrant soft and mildly tender to palpation with negative Murphy's sign. No peritoneal signs and no guarding. Hypoactive bowel sounds appreciated. MSK: No deformities or edema.  Derm: Warm and dry, no visible rashes Neuro: No focal neurological deficits, alert and oriented, strength intact Psych: Normal mood and affect  Laboratory:  Recent Labs Lab 08/19/16 0643 08/20/16 0443  WBC 12.3* 7.4  HGB 11.0* 10.4*  HCT 35.7* 35.3*  PLT 644* 464*    Recent Labs Lab 08/19/16 0643 08/20/16 0443  NA 134* 134*  K 5.0 3.3*  CL 96* 100*  CO2 27 26  BUN 14 9  CREATININE 1.14* 0.94  CALCIUM 8.6* 7.3*  PROT 8.2* 7.6  BILITOT 1.0 0.3  ALKPHOS 75 64  ALT 28 13*  AST 76* 27  GLUCOSE 120* 108*    Imaging/Diagnostic Tests: Dg Abdomen 1 View  Result Date: 08/19/2016 CLINICAL DATA:  Epigastric pain. EXAM: ABDOMEN - 1 VIEW COMPARISON:  July 27, 2016 FINDINGS: The bowel gas pattern is normal. No radio-opaque calculi or other significant radiographic abnormality are seen. IMPRESSION: Negative. Electronically Signed   By: Dorise Bullion III M.D   On: 08/19/2016 08:26   US Abdomen Complete  Result Date: 08/19/2016 CLINICAL DATA:  Abdominal and epigastric pain for almost 1 month. EXAM: ABDOMEN ULTRASOUND COMPLETE COMPARISON:  CT, 07/24/2016. FINDINGS: Gallbladder: No gallstones or wall thickening visualized. No sonographic Murphy sign noted by sonographer. Common bile duct: Diameter: 3 mm Liver: Mild increased parenchymal echogenicity suggesting fatty infiltration. No mass or focal lesion. IVC: Not visualized due to midline bowel gas. Pancreas: Not visualized due to midline bowel gas. Spleen: Size and appearance  within normal limits. Right Kidney: Length: 10.8 cm. Echogenicity within normal limits. No mass or hydronephrosis visualized. Left Kidney: Length: 11.6 cm. Echogenicity within normal limits. No mass or hydronephrosis visualized. Abdominal aorta: No aneurysm visualized. Other findings: None. IMPRESSION: 1. No acute findings.  Normal gallbladder.  No bile duct dilation. 2. Mild increased echogenicity of the liver is consistent with hepatic steatosis. 3. No other abnormalities. Midline structures including the pancreas were obscured by bowel gas. Electronically Signed   By: Lajean Manes M.D.   On: 08/19/2016 11:12   Ct Abdomen Pelvis W Contrast  Result Date: 08/19/2016 CLINICAL DATA:  Abdominal pain and constipation for several weeks. EXAM: CT ABDOMEN AND PELVIS WITH CONTRAST TECHNIQUE: Multidetector CT imaging of the abdomen and pelvis was performed using the standard protocol following bolus administration of intravenous contrast. CONTRAST:  1 ISOVUE-300 IOPAMIDOL (ISOVUE-300) INJECTION 61% COMPARISON:  07/24/2016 FINDINGS: Lower chest: No acute abnormality. Hepatobiliary: No focal liver abnormality is seen. No gallstones, gallbladder wall thickening, or biliary dilatation. Pancreas: There are mild inflammatory changes centered on the inferior pancreatic head and uncinate process similar to the prior exam. Remainder the pancreas is unremarkable. There is uniform pancreatic enhancement. Spleen: Normal in size without focal abnormality. Adrenals/Urinary Tract: Adrenal glands are unremarkable. Kidneys are normal, without renal calculi, focal lesion,  or hydronephrosis. Bladder is unremarkable. Stomach/Bowel: Stomach is within normal limits. Appendix appears normal. No evidence of bowel wall thickening, distention, or inflammatory changes. Vascular/Lymphatic: There several prominent peripancreatic and gastrohepatic ligament lymph nodes none pathologically enlarged by size criteria, all stable. No significant vascular  abnormality. Reproductive: Status post hysterectomy. No adnexal masses. Other: No abdominal wall hernia or abnormality. No abdominopelvic ascites. Musculoskeletal: No acute or significant osseous findings. IMPRESSION: 1. Findings are consistent with mild focal pancreatitis. There is no evidence of pancreatic necrosis or pseudocyst. Patent portal, splenic and superior mesenteric veins. These findings are without significant change from the prior CT. 2. No other acute abnormalities. Electronically Signed   By: Lajean Manes M.D.   On: 08/19/2016 13:07    Eloise Levels, MD 08/20/2016, 7:27 AM PGY-1, [place] Intern pager: [phone], text pages welcome

## 2016-08-20 NOTE — Discharge Summary (Signed)
[facility] Discharge Summary  Patient name: Emily Mathis Medical record number: [id number] Date of birth: 29-Sep-1965 Age: 50 y.o. Gender: female Date of Admission: 08/19/2016  Date of Discharge:  Admitting Physician: Alveda Reasons, MD  Primary Care Provider: Harrison Mons, PA-C Consultants: None  Indication for Hospitalization: Abdominal pain  Discharge Diagnoses/Problem List:  Active Problems:   Pancreatitis   Abdominal pain   Chronic pancreatitis ([place])   Constipation   Hypothyroidism    Disposition: Discharged home  Discharge Condition: stable, improved  Discharge Exam:  General: 50 year old female sitting in bed looking at her phone appearing comfortable ENTM: Moist mucous membranes Cardiovascular: Regular rate and rhythm, S1-S2 present, no murmurs rubs or gallops Respiratory: CTAB, good air movement. No wheezing or crackles appreciated. normal work of breathing.  Gastrointestinal: Abdomen revealed well-healed trocar scars, soft, NTND Psych: normal mood and affect   Brief Hospital Course:  Presenting with epigastric pain radiating to her back has been present for one month but increasing severity the last week. She does have significant history of a hysterectomy in September. Patient initially given 12 mg total of morphine and GI cocktail needed without much relief. She was CT scan of her abdomen that showed mild inflammatory changes in the inferior pancreatic head and uncinate process. No leukocytosis or elevations in lipase or LFTs.  CT did now significant stool burden. She was started on a bowel regimen including 2 enemas and had several bowel movements which improved her abdominal pain and was also initially given Dilaudid 1 mg every 3 hours PRN pain and eventually transitioned over to Toradol and pain was well controlled.  At the time of discharge she was tolerating diet and her pain was well controlled and return precautions were  discussed. Patient was encouraged to follow up with her primary care doctor.   Issues for Follow Up:  1. Constipation: Significant stool burden during admission, likely the cause of most of her abdominal pain, along with uncontrolled hypothyroidism.  Discharged on senna and colace PRN. Follow up on BMs 2. Hypothyroidism: TSH was 80 during this admission and patient reports 316mcg synthroid with poor compliance.  Restarted this during admission.  Should be rechecked in 6-8 weeks.  3. Hypocalcemia: Calcium dipped down to 7.2 at one point during admission. Consider checking PTH.  Significant Procedures: None  Significant Labs and Imaging:   Recent Labs Lab 08/20/16 0443 08/21/16 0624 08/22/16 0607  WBC 7.4 5.5 6.6  HGB 10.4* 9.5* 9.8*  HCT 35.3* 32.2* 32.6*  PLT 464* 456* 464*    Recent Labs Lab 08/19/16 0643 08/20/16 0443 08/21/16 0624 08/22/16 0607  NA 134* 134* 138 138  K 5.0 3.3* 3.2* 3.4*  CL 96* 100* 102 101  CO2 27 26 28 26   GLUCOSE 120* 108* 97 106*  BUN 14 9 <5* <5*  CREATININE 1.14* 0.94 0.87 0.92  CALCIUM 8.6* 7.3* 7.2* 8.1*  ALKPHOS 75 64  --   --   AST 76* 27  --   --   ALT 28 13*  --   --   ALBUMIN 3.6 3.3*  --   --      Results/Tests Pending at Time of Discharge: None  Discharge Medications:    Medication List    TAKE these medications   albuterol 108 (90 Base) MCG/ACT inhaler Commonly known as:  PROVENTIL HFA;VENTOLIN HFA Inhale 2 puffs into the lungs daily as needed. Reported on 01/04/2016   ALPRAZolam 0.5 MG tablet Commonly known as:  XANAX Take 1 tablet (0.5 mg total) by mouth at bedtime as needed. for sleep   Canagliflozin-Metformin HCl 150-500 MG Tabs Commonly known as:  INVOKAMET Take 1 tablet by mouth daily. What changed:  Another medication with the same name was removed. Continue taking this medication, and follow the directions you see here.   chlorthalidone 25 MG tablet Commonly known as:  HYGROTON TAKE 1 TABLET(25 MG) BY MOUTH  DAILY   diazepam 5 MG tablet Commonly known as:  VALIUM Take 1 tablet (5 mg total) by mouth daily.   docusate sodium 250 MG capsule Commonly known as:  COLACE Take 1 capsule (250 mg total) by mouth daily. What changed:  Another medication with the same name was added. Make sure you understand how and when to take each.   docusate sodium 100 MG capsule Commonly known as:  COLACE Take 1 capsule (100 mg total) by mouth 2 (two) times daily. What changed:  You were already taking a medication with the same name, and this prescription was added. Make sure you understand how and when to take each.   ferrous sulfate 325 (65 FE) MG tablet Take 325 mg by mouth 3 (three) times daily with meals.   glycerin adult 2 g suppository Place 1 suppository rectally as needed for constipation.   ibuprofen 800 MG tablet Commonly known as:  ADVIL,MOTRIN Take 1 tablet (800 mg total) by mouth every 8 (eight) hours as needed. What changed:  reasons to take this   levothyroxine 300 MCG tablet Commonly known as:  SYNTHROID, LEVOTHROID Take 1 tablet (300 mcg total) by mouth daily. What changed:  Another medication with the same name was added. Make sure you understand how and when to take each.   levothyroxine 300 MCG tablet Commonly known as:  SYNTHROID, LEVOTHROID Take 1 tablet (300 mcg total) by mouth daily before breakfast. What changed:  You were already taking a medication with the same name, and this prescription was added. Make sure you understand how and when to take each.   lisinopril 2.5 MG tablet Commonly known as:  PRINIVIL,ZESTRIL TAKE 1 TABLET BY MOUTH EVERY DAY What changed:  See the new instructions.   nystatin powder Commonly known as:  MYCOSTATIN/NYSTOP Apply topically 4 (four) times daily. What changed:  when to take this  reasons to take this   ondansetron 4 MG disintegrating tablet Commonly known as:  ZOFRAN ODT Take 1 tablet (4 mg total) by mouth every 8 (eight) hours as  needed for nausea or vomiting.   pantoprazole 40 MG tablet Commonly known as:  PROTONIX TAKE 1 TABLET BY MOUTH TWICE DAILY What changed:  See the new instructions.   polyethylene glycol packet Commonly known as:  MIRALAX / GLYCOLAX Take 17 g by mouth 2 (two) times daily. Take 2 packets today (day one), wait 2 hours, if no stool produced then take 2 more packets on day one; then continue taking 2 packets daily until you achieve daily soft stools; if water stool develops then cut back to 1 packet daily. What changed:  Another medication with the same name was added. Make sure you understand how and when to take each.   polyethylene glycol packet Commonly known as:  MIRALAX / GLYCOLAX Take 17 g by mouth 2 (two) times daily. What changed:  You were already taking a medication with the same name, and this prescription was added. Make sure you understand how and when to take each.   potassium chloride SA 20 MEQ tablet  Commonly known as:  K-DUR,KLOR-CON Take 1 tablet (20 mEq total) by mouth 2 (two) times daily.   ranitidine 150 MG tablet Commonly known as:  ZANTAC Take 1 tablet (150 mg total) by mouth 2 (two) times daily.   senna 8.6 MG Tabs tablet Commonly known as:  SENOKOT Take 1 tablet (8.6 mg total) by mouth daily as needed for mild constipation.   sertraline 100 MG tablet Commonly known as:  ZOLOFT TAKE 1 TABLET BY MOUTH EVERY DAY   sucralfate 1 g tablet Commonly known as:  CARAFATE Take 1 tablet (1 g total) by mouth 4 (four) times daily.   Vitamin D3 50000 units Caps Take 50,000 Units by mouth every Monday.       Discharge Instructions: Please refer to Patient Instructions section of EMR for full details.  Patient was counseled important signs and symptoms that should prompt return to medical care, changes in medications, dietary instructions, activity restrictions, and follow up appointments.   Follow-Up Appointments:   Eloise Levels, MD 08/23/2016, 12:51 PM PGY-1,  [place]

## 2016-08-20 NOTE — Progress Notes (Signed)
Patient having a lot of discomfort, no success with first enema, so I notified teaching service intern and they said they would look and see what else we could do.

## 2016-08-21 DIAGNOSIS — K5900 Constipation, unspecified: Secondary | ICD-10-CM

## 2016-08-21 DIAGNOSIS — K861 Other chronic pancreatitis: Secondary | ICD-10-CM

## 2016-08-21 DIAGNOSIS — R109 Unspecified abdominal pain: Secondary | ICD-10-CM

## 2016-08-21 LAB — CBC
HCT: 32.2 % — ABNORMAL LOW (ref 36.0–46.0)
Hemoglobin: 9.5 g/dL — ABNORMAL LOW (ref 12.0–15.0)
MCH: 23.7 pg — AB (ref 26.0–34.0)
MCHC: 29.5 g/dL — ABNORMAL LOW (ref 30.0–36.0)
MCV: 80.3 fL (ref 78.0–100.0)
PLATELETS: 456 10*3/uL — AB (ref 150–400)
RBC: 4.01 MIL/uL (ref 3.87–5.11)
RDW: 18.9 % — ABNORMAL HIGH (ref 11.5–15.5)
WBC: 5.5 10*3/uL (ref 4.0–10.5)

## 2016-08-21 LAB — T3, FREE: T3, Free: <0.4 pg/mL — ABNORMAL LOW (ref 2.0–4.4)

## 2016-08-21 LAB — BASIC METABOLIC PANEL
Anion gap: 8 (ref 5–15)
BUN: <5 mg/dL — ABNORMAL LOW (ref 6–20)
CALCIUM: 7.2 mg/dL — AB (ref 8.9–10.3)
CO2: 28 mmol/L (ref 22–32)
CREATININE: 0.87 mg/dL (ref 0.44–1.00)
Chloride: 102 mmol/L (ref 101–111)
Glucose, Bld: 97 mg/dL (ref 65–99)
Potassium: 3.2 mmol/L — ABNORMAL LOW (ref 3.5–5.1)
SODIUM: 138 mmol/L (ref 135–145)

## 2016-08-21 LAB — GLUCOSE, CAPILLARY
GLUCOSE-CAPILLARY: 99 mg/dL (ref 65–99)
GLUCOSE-CAPILLARY: 101 mg/dL — AB (ref 65–99)
GLUCOSE-CAPILLARY: 132 mg/dL — AB (ref 65–99)
Glucose-Capillary: 106 mg/dL — ABNORMAL HIGH (ref 65–99)

## 2016-08-21 LAB — HEMOGLOBIN A1C
Hgb A1c MFr Bld: 6.3 % — ABNORMAL HIGH (ref 4.8–5.6)
Mean Plasma Glucose: 134 mg/dL

## 2016-08-21 MED ORDER — POTASSIUM CHLORIDE CRYS ER 20 MEQ PO TBCR
40.0000 meq | EXTENDED_RELEASE_TABLET | Freq: Once | ORAL | Status: AC
Start: 2016-08-21 — End: 2016-08-21
  Administered 2016-08-21: 40 meq via ORAL
  Filled 2016-08-21: qty 2

## 2016-08-21 MED ORDER — CHLORTHALIDONE 25 MG PO TABS
25.0000 mg | ORAL_TABLET | Freq: Every day | ORAL | Status: DC
  Administered 2016-08-21 – 2016-08-22 (×2): 25 mg via ORAL
  Filled 2016-08-21 (×2): qty 1

## 2016-08-21 MED ORDER — KETOROLAC TROMETHAMINE 15 MG/ML IJ SOLN
15.0000 mg | Freq: Four times a day (QID) | INTRAMUSCULAR | Status: DC | PRN
  Administered 2016-08-21: 15 mg via INTRAVENOUS
  Filled 2016-08-21: qty 1

## 2016-08-21 MED ORDER — LISINOPRIL 5 MG PO TABS
2.5000 mg | ORAL_TABLET | Freq: Every day | ORAL | Status: DC
  Administered 2016-08-21 – 2016-08-22 (×2): 2.5 mg via ORAL
  Filled 2016-08-21 (×2): qty 1

## 2016-08-21 MED ORDER — POLYETHYLENE GLYCOL 3350 17 G PO PACK
17.0000 g | PACK | Freq: Two times a day (BID) | ORAL | Status: DC
  Administered 2016-08-21 – 2016-08-22 (×3): 17 g via ORAL
  Filled 2016-08-21 (×3): qty 1

## 2016-08-21 NOTE — Progress Notes (Signed)
Family Medicine Teaching Service Daily Progress Note Intern Pager: [phone]  Patient name: Emily Mathis Medical record number: [id number] Date of birth: 27-Aug-1966 Age: 50 y.o. Gender: female  Primary Care Provider: Harrison Mons, PA-C Consultants: None Code Status: Full code  Pt Overview and Major Events to Date:  Emily Mathis is a 50 y.o. female presenting with epigastric pain and nausea . PMH is significant for anxiety,T2DM, pancreatitis, HTN, HLD, GERD, hypothyroidism, asthma.   #Abdominal pain: Patient noting epigastric pain radiating to her back that has been present for a month and increasingly severe for past week. CT scan showed mild inflammatory changes seen in the  inferior pancreatic head and uncinate process, consistent with mild focal pancreatitis. Labs and history are to not suggest pancreatitis. Patient also found to have TSH level of 80 in the setting of hypothyroidism.  Very likely that her uncontrolled hypothyroidism is contributing largely to her symptoms.  Started synthroid 324mcg yesterday.  Small BM yesterday. Continues to endorse 7/10 pain.   - bowel regimen: colace 100mg  BID, Senna 1 tab daily PRN, add miralax BID, consider enema if no BM by noon - walk patient - holding canagliflozin - advanced diet to clear liquid and advance as tolerated - DC dilauded  - zofran 4mg  IV q6hrs PRN nausea  #Constipation: Found to have large stool burden on imaging and had good BM yesterday. Nurse did not feel she had great output.  -Colace 100mg  BID  - add miralax BID -MOM enema if no BM @ noon -Senna 1 tab daily PRN  Hypokalemia: 3.2 - repleted with 40kdur  #Hyperlipidemia: ASCVD risk 8.5% with LDL 138.  Appropriate to start high intensity statin.  Will discuss with patient during this admission.  - consider starting high intensity statin upon discharge  #AKI, resolved: Baseline .7-.9 now 1.14 on admission. Most liekly prerenal in setting of decreased PO intake. Improved  to 0.94 this morning.  -monitor with daily BMPs -avoid nephrotoxic mediations -IVF as above  #GERD:  Takes ranitidine 150mg  BID and protonix 40mg  BID at home.   - cont protonix IV QHS  #Hypothyroid:  Last TSH was 0.75 on 6/17.  Takes 364mcg synthroid daily.  Repeat TSH was 80.  Patient reports taking medicine 2-3x/week.  - added on T3/T4 to previous collection - started 341mcg synthroid daily - recheck in 6-8 weeks  #T2DM: Last A1C in 5/17 was 6.9. Takes invokamet (canagliflozin/metformin combo) - holding invokamet - follow CBGs q4hrs while NPO - repeat A1c - considering adding SSI when taking PO - may need d5 added to fluids  #HTN: Normotensive on admission. Takes lisinopril and chlorthalidone at home.  156/80 this morning.  -restart chlorthalidone and lisinopril   #Iron deficiency anemia:  hgb 11 upon admission.  Baseline is 9-11.  Patient denies any SOB, CP or dizziness.  - AM CBC -monitor -takes iron TID at home; holding  #Anxiety/Depression:  Stable. Takes xanax 0.5mg  at bedtime for sleep and 5mg  valium daily.  - ativan 0.5mg  IV QHS -holding zoloft while NPO   FEN/GI: NPO/ protonix Prophylaxis: lovenox Disposition: discharge home when stable  Subjective:  Patient feels well this morning and rates pain 7/10.  Denies CP, SOB, NVD.  Is amendable to receiving another enema and working on moving her bowels.   Objective: Temp:  [97.9 F (36.6 C)-98 F (36.7 C)] 98 F (36.7 C) (12/05 0417) Pulse Rate:  [77-90] 90 (12/05 0417) Resp:  [19] 19 (12/05 0417) BP: (125-156)/(68-80) 156/80 (12/05 0417) SpO2:  [100 %] 100 % (  12/05 0417) Physical Exam: General: 50 year old female sitting in bed looking at her phone appearing comfortable ENTM: Moist mucous membranes Cardiovascular: Regular rate and rhythm, S1-S2 present, no murmurs rubs or gallops Respiratory: CTAB, good air movement. No wheezing or crackles appreciated. normal work of breathing.  Gastrointestinal:  Abdomen revealed well-healed trocar scars, epigastrium and right upper quadrant soft and mildly tender to palpation with negative Murphy's sign. No peritoneal signs and no guarding.   Laboratory:  Recent Labs Lab 08/19/16 0643 08/20/16 0443 08/21/16 0624  WBC 12.3* 7.4 5.5  HGB 11.0* 10.4* 9.5*  HCT 35.7* 35.3* 32.2*  PLT 644* 464* 456*    Recent Labs Lab 08/19/16 0643 08/20/16 0443 08/21/16 0624  NA 134* 134* 138  K 5.0 3.3* 3.2*  CL 96* 100* 102  CO2 27 26 28   BUN 14 9 <5*  CREATININE 1.14* 0.94 0.87  CALCIUM 8.6* 7.3* 7.2*  PROT 8.2* 7.6  --   BILITOT 1.0 0.3  --   ALKPHOS 75 64  --   ALT 28 13*  --   AST 76* 27  --   GLUCOSE 120* 108* 97    Imaging/Diagnostic Tests: Dg Abd Portable 1v  Result Date: 08/20/2016 CLINICAL DATA:  Pt having epigastric pain and nausea. Pt has pancreatitis. EXAM: PORTABLE ABDOMEN - 1 VIEW COMPARISON:  CT, 08/19/2016 FINDINGS: There is no bowel dilation to suggest obstruction or significant adynamic ileus. Soft tissues are poorly defined. IMPRESSION: Negative. Electronically Signed   By: Lajean Manes M.D.   On: 08/20/2016 17:27    Eloise Levels, MD 08/21/2016, 9:49 AM PGY-1, [place] Intern pager: [phone], text pages welcome

## 2016-08-22 DIAGNOSIS — E039 Hypothyroidism, unspecified: Secondary | ICD-10-CM

## 2016-08-22 LAB — BASIC METABOLIC PANEL
Anion gap: 11 (ref 5–15)
CHLORIDE: 101 mmol/L (ref 101–111)
CO2: 26 mmol/L (ref 22–32)
CREATININE: 0.92 mg/dL (ref 0.44–1.00)
Calcium: 8.1 mg/dL — ABNORMAL LOW (ref 8.9–10.3)
GFR calc Af Amer: >60 mL/min (ref >60)
GFR calc non Af Amer: >60 mL/min (ref >60)
GLUCOSE: 106 mg/dL — AB (ref 65–99)
Potassium: 3.4 mmol/L — ABNORMAL LOW (ref 3.5–5.1)
SODIUM: 138 mmol/L (ref 135–145)

## 2016-08-22 LAB — GLUCOSE, CAPILLARY
Glucose-Capillary: 103 mg/dL — ABNORMAL HIGH (ref 65–99)
Glucose-Capillary: 122 mg/dL — ABNORMAL HIGH (ref 65–99)
Glucose-Capillary: 123 mg/dL — ABNORMAL HIGH (ref 65–99)
Glucose-Capillary: 127 mg/dL — ABNORMAL HIGH (ref 65–99)
Glucose-Capillary: 99 mg/dL (ref 65–99)

## 2016-08-22 LAB — CBC
HEMATOCRIT: 32.6 % — AB (ref 36.0–46.0)
Hemoglobin: 9.8 g/dL — ABNORMAL LOW (ref 12.0–15.0)
MCH: 23.6 pg — AB (ref 26.0–34.0)
MCHC: 30.1 g/dL (ref 30.0–36.0)
MCV: 78.6 fL (ref 78.0–100.0)
PLATELETS: 464 10*3/uL — AB (ref 150–400)
RBC: 4.15 MIL/uL (ref 3.87–5.11)
RDW: 19 % — AB (ref 11.5–15.5)
WBC: 6.6 10*3/uL (ref 4.0–10.5)

## 2016-08-22 MED ORDER — POLYETHYLENE GLYCOL 3350 17 G PO PACK: 17.0000 g | PACK | Freq: Two times a day (BID) | ORAL | 0 refills | Status: DC

## 2016-08-22 MED ORDER — SENNA 8.6 MG PO TABS: 1.0000 | ORAL_TABLET | Freq: Every day | ORAL | 0 refills | Status: AC | PRN

## 2016-08-22 MED ORDER — POTASSIUM CHLORIDE CRYS ER 20 MEQ PO TBCR
40.0000 meq | EXTENDED_RELEASE_TABLET | Freq: Once | ORAL | Status: AC
Start: 2016-08-22 — End: 2016-08-22
  Administered 2016-08-22: 40 meq via ORAL
  Filled 2016-08-22: qty 2

## 2016-08-22 MED ORDER — DOCUSATE SODIUM 100 MG PO CAPS: 100.0000 mg | ORAL_CAPSULE | Freq: Two times a day (BID) | ORAL | 0 refills | Status: DC

## 2016-08-22 MED ORDER — LEVOTHYROXINE SODIUM 300 MCG PO TABS: 300.0000 ug | ORAL_TABLET | Freq: Every day | ORAL | 0 refills | Status: DC

## 2016-08-22 NOTE — Progress Notes (Signed)
Pt left facility via wheelchair with no distress noted. Pt verbalized understanding of discharge instructions. IV removed.

## 2016-08-22 NOTE — Progress Notes (Signed)
Family Medicine Teaching Service Daily Progress Note Intern Pager: [phone]  Patient name: Emily Mathis Medical record number: [id number] Date of birth: 1966/05/19 Age: 50 y.o. Gender: female  Primary Care Provider: Harrison Mons, PA-C Consultants: None Code Status: Full code  Pt Overview and Major Events to Date:  Emily Mathis is a 50 y.o. female presenting with epigastric pain and nausea . PMH is significant for anxiety,T2DM, pancreatitis, HTN, HLD, GERD, hypothyroidism, asthma.   #Abdominal pain:  Pain well controlled with Toradol.  Had 4BM yesterday and 2 this morning.  Will advance diet to softs and encourage continued ambulation.  - bowel regimen: colace 100mg  BID, Senna 1 tab daily PRN, add miralax BID - encourage ambulation - holding canagliflozin - diet advanced to soft---continue to advance as tolerated - zofran 4mg  IV q6hrs PRN nausea  #Constipation: Found to have large stool burden on imaging and had good BM yesterday. Nurse did not feel she had great output.   4BM yesterday and 2 this AM.  Feels much better. Improved abd pain.  -Colace 100mg  BID  - continue miralax BID -MOM enema if no BM @ noon -Senna 1 tab daily PRN - will discharge with scripts for colace and senna  Hypokalemia: 3.4 - repleted with 40kdur  #Hyperlipidemia: ASCVD risk 8.5% with LDL 138.  Appropriate to start high intensity statin.  Will discuss with patient during this admission.  - consider starting high intensity statin upon discharge  #AKI, resolved: Baseline .7-.9 now 1.14 on admission. Most liekly prerenal in setting of decreased PO intake. Improved to 0.94 this morning.  -monitor with daily BMPs -avoid nephrotoxic mediations -IVF as above  #GERD:  Takes ranitidine 150mg  BID and protonix 40mg  BID at home.   - cont protonix IV QHS  #Hypothyroid:  Last TSH was 0.75 on 6/17.  Takes 345mcg synthroid daily.  Repeat TSH was 80.  Patient reports taking medicine 2-3x/week.  - added on  T3/T4 to previous collection - started 361mcg synthroid daily - recheck in 6-8 weeks  #T2DM: Last A1C in 5/17 was 6.9. Takes invokamet (canagliflozin/metformin combo) - holding invokamet - follow CBGs q4hrs while NPO - repeat A1c - considering adding SSI when taking PO - may need d5 added to fluids  #HTN: Normotensive on admission. Takes lisinopril and chlorthalidone at home.  110/64 this morning.  -cont chlorthalidone and lisinopril   #Iron deficiency anemia:  hgb 11 upon admission.  Baseline is 9-11.  Patient denies any SOB, CP or dizziness.  - AM CBC -monitor -takes iron TID at home; holding  #Anxiety/Depression:  Stable. Takes xanax 0.5mg  at bedtime for sleep and 5mg  valium daily.  - ativan 0.5mg  IV QHS -holding zoloft while NPO   FEN/GI: NPO/ protonix Prophylaxis: lovenox Disposition: discharge home when stable  Subjective:  Patient feels well this morning and states that pain is well controlled. Denies SOB, SP, NVD.   Objective: Temp:  [97.7 F (36.5 C)-97.9 F (36.6 C)] 97.9 F (36.6 C) (12/06 0434) Pulse Rate:  [75-83] 75 (12/06 0434) Resp:  [20] 20 (12/06 0434) BP: (110-144)/(64-88) 110/64 (12/06 0434) SpO2:  [93 %-99 %] 99 % (12/06 0434) Physical Exam: General: 50 year old female sitting in bed looking at her phone appearing comfortable ENTM: Moist mucous membranes Cardiovascular: Regular rate and rhythm, S1-S2 present, no murmurs rubs or gallops Respiratory: CTAB, good air movement. No wheezing or crackles appreciated. normal work of breathing.  Gastrointestinal: Abdomen revealed well-healed trocar scars, soft, NTND Psych: normal mood and affect  Laboratory:  Recent Labs  Lab 08/20/16 0443 08/21/16 0624 08/22/16 0607  WBC 7.4 5.5 6.6  HGB 10.4* 9.5* 9.8*  HCT 35.3* 32.2* 32.6*  PLT 464* 456* 464*    Recent Labs Lab 08/19/16 0643 08/20/16 0443 08/21/16 0624 08/22/16 0607  NA 134* 134* 138 138  K 5.0 3.3* 3.2* 3.4*  CL 96* 100* 102 101   CO2 27 26 28 26   BUN 14 9 <5* <5*  CREATININE 1.14* 0.94 0.87 0.92  CALCIUM 8.6* 7.3* 7.2* 8.1*  PROT 8.2* 7.6  --   --   BILITOT 1.0 0.3  --   --   ALKPHOS 75 64  --   --   ALT 28 13*  --   --   AST 76* 27  --   --   GLUCOSE 120* 108* 97 106*    Imaging/Diagnostic Tests: No results found.  Eloise Levels, MD 08/22/2016, 9:03 AM PGY-1, [place] Intern pager: [phone], text pages welcome

## 2016-09-02 ENCOUNTER — Other Ambulatory Visit: Payer: Self-pay | Admitting: Physician Assistant

## 2016-09-06 ENCOUNTER — Ambulatory Visit (INDEPENDENT_AMBULATORY_CARE_PROVIDER_SITE_OTHER): Payer: BLUE CROSS/BLUE SHIELD | Admitting: Physician Assistant

## 2016-09-06 VITALS — BP 118/76 | HR 88 | Temp 98.2°F | Resp 16 | Ht 63.0 in | Wt 263.8 lb

## 2016-09-06 DIAGNOSIS — M791 Myalgia, unspecified site: Secondary | ICD-10-CM

## 2016-09-06 DIAGNOSIS — K5900 Constipation, unspecified: Secondary | ICD-10-CM | POA: Diagnosis not present

## 2016-09-06 DIAGNOSIS — K861 Other chronic pancreatitis: Secondary | ICD-10-CM

## 2016-09-06 DIAGNOSIS — M7712 Lateral epicondylitis, left elbow: Secondary | ICD-10-CM

## 2016-09-06 DIAGNOSIS — D509 Iron deficiency anemia, unspecified: Secondary | ICD-10-CM | POA: Diagnosis not present

## 2016-09-06 MED ORDER — CELECOXIB 200 MG PO CAPS: 200.0000 mg | ORAL_CAPSULE | Freq: Two times a day (BID) | ORAL | 1 refills | Status: DC

## 2016-09-06 MED ORDER — DOCUSATE SODIUM 250 MG PO CAPS: 250.0000 mg | ORAL_CAPSULE | Freq: Every day | ORAL | 0 refills | Status: AC

## 2016-09-06 NOTE — Progress Notes (Signed)
Patient ID: Emily Mathis, female    DOB: 1966-05-19, 50 y.o.   MRN: [id number]  PCP: Porfirio Oar, PA-C  Chief Complaint  Patient presents with  . Hospitalization Follow-up    Subjective:   Presents for hospital follow-up.   She was admitted through the ED on 08/19/2016 with chronic pancreatitis. CT scan revelead mild inflammatory changes in the pancreas, and significant stool burden. She was started on a bowel regimen and after several BMs, pain improved. Also of note, her TSH was 80. She relates that she had not been taking it regularly. Relates that when she feels better, she tries to stop her medications, specifically the levothyroxine and diabetes meds.  "I don't hurt like I did." Is having regular BMs, but not every day. Still has low abdominal cramping periodically.  RIGHT low back pain persists. Like a muscle cramp. Increases with exertion. Burning type pain. Her aunt recommended a handicap placard to allow her to park more closely.  LEFT leg has a knot on it. It hurts and extends all the way up to the thigh on the LEFT. The lump has been present x "a while." THe discomfort began prior to her visit with me on 07/27/2016. From that visit: She notes a knot on lower left leg with associated "burning" pain which started bothering her again about a week ago. 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 States it causes her pain randomly. Notes it hurts more when lying down and moving her legs around provides some relief. Denies warmth or erythema at the site.   LEFT arm pain. Worst at the outer elbow. Feels still. Edwyna Shell to raise up.   Takes Tylenol for pain without benefit. Has used meloxicam previously with good results, she recalls.   Review of Systems As above. No CP, SOB, HA, Dizziness. No nausea, vomiting. No rash.    Patient Active Problem List   Diagnosis Date Noted  . Hypothyroidism   . Abdominal  pain   . Chronic pancreatitis (HCC)   . Constipation   . Pancreatitis 08/19/2016  . Status post laparoscopic hysterectomy 06/11/2016  . Iron deficiency anemia 02/15/2016  . Myalgia 02/15/2016  . Upper airway cough syndrome 10/21/2015  . Polyarthralgia 06/16/2015  . Diabetes mellitus type 2, uncomplicated (HCC) 09/14/2013  . Keloid scar, cervical incision 09/02/2013  . Reaction, situational, acute, to stress 07/02/2013  . 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 Severe obesity (BMI >= 40) (HCC) 03/27/2013  . GERD (gastroesophageal reflux disease) 02/15/2013  . Hypocalcemia 01/07/2013  . Chest pain on exertion 07/24/2011     Prior to Admission medications   Medication Sig Start Date End Date Taking? Authorizing Provider  albuterol (PROVENTIL HFA;VENTOLIN HFA) 108 (90 Base) MCG/ACT inhaler Inhale 2 puffs into the lungs daily as needed. Reported on 01/04/2016 03/28/16  Yes Taquanna Borras Leotis Shames, PA-C  ALPRAZolam (XANAX) 0.5 MG tablet Take 1 tablet (0.5 mg total) by mouth at bedtime as needed. for sleep 07/03/16  Yes Carline Dura, PA-C  Canagliflozin-Metformin HCl (INVOKAMET) 150-500 MG TABS Take 1 tablet by mouth daily. 04/25/16  Yes Valeta Paz, PA-C  chlorthalidone (HYGROTON) 25 MG tablet TAKE 1 TABLET(25 MG) BY MOUTH DAILY 09/05/16  Yes Lyriq Jarchow, PA-C  Cholecalciferol (VITAMIN D3) 50000 units CAPS Take 50,000 Units by mouth every Monday.  06/14/16  Yes Historical Provider, MD  diazepam (VALIUM) 5 MG tablet Take 1 tablet (5 mg total) by mouth daily. 07/03/16  Yes Drew Herman, PA-C  ferrous sulfate 325 (65 FE) MG tablet Take 325 mg by mouth 3 (three) times daily with meals.   Yes Historical Provider, MD  glycerin adult 2 g suppository Place 1 suppository rectally as needed for constipation.   Yes Historical Provider, MD  ibuprofen (ADVIL,MOTRIN) 800 MG tablet Take 1 tablet (800 mg total) by mouth every 8 (eight) hours as needed. Patient taking differently: Take 800 mg by mouth every 8  (eight) hours as needed for headache or moderate pain.  06/22/16  Yes Brook E Ardell Isaacs, MD  levothyroxine (SYNTHROID, LEVOTHROID) 300 MCG tablet Take 1 tablet (300 mcg total) by mouth daily. 06/17/15  Yes Binnie Droessler, PA-C  levothyroxine (SYNTHROID, LEVOTHROID) 300 MCG tablet Take 1 tablet (300 mcg total) by mouth daily before breakfast. 08/23/16  Yes Renne Musca, MD  lisinopril (PRINIVIL,ZESTRIL) 2.5 MG tablet TAKE 1 TABLET BY MOUTH EVERY DAY Patient taking differently: TAKE 2.5 MG BY MOUTH EVERY DAY 03/29/16  Yes Tiyona Desouza, PA-C  nystatin (MYCOSTATIN) powder Apply topically 4 (four) times daily. Patient taking differently: Apply topically 4 (four) times daily as needed (For heat rash.).  10/02/15  Yes Libbi Towner, PA-C  pantoprazole (PROTONIX) 40 MG tablet TAKE 1 TABLET BY MOUTH TWICE DAILY Patient taking differently: TAKE 40 MG BY MOUTH TWICE DAILY 03/29/16  Yes Brigette Hopfer, PA-C  polyethylene glycol (MIRALAX / GLYCOLAX) packet Take 17 g by mouth 2 (two) times daily. Take 2 packets today (day one), wait 2 hours, if no stool produced then take 2 more packets on day one; then continue taking 2 packets daily until you achieve daily soft stools; if water stool develops then cut back to 1 packet daily. 07/24/16  Yes Mercedes Camprubi-Soms, PA-C  polyethylene glycol (MIRALAX / GLYCOLAX) packet Take 17 g by mouth 2 (two) times daily. 08/22/16  Yes Renne Musca, MD  potassium chloride SA (K-DUR,KLOR-CON) 20 MEQ tablet Take 1 tablet (20 mEq total) by mouth 2 (two) times daily. 06/12/16  Yes Brook E Ardell Isaacs, MD  ranitidine (ZANTAC) 150 MG tablet Take 1 tablet (150 mg total) by mouth 2 (two) times daily. 07/24/16  Yes Mercedes Camprubi-Soms, PA-C  senna (SENOKOT) 8.6 MG TABS tablet Take 1 tablet (8.6 mg total) by mouth daily as needed for mild constipation. 08/22/16  Yes Asiyah Mayra Reel, MD  sertraline (ZOLOFT) 100 MG tablet TAKE 1 TABLET BY MOUTH EVERY DAY 08/18/16  Yes Shade Flood, MD  sucralfate (CARAFATE) 1 G tablet Take 1 tablet (1 g total) by mouth 4 (four) times daily. 04/19/15  Yes Marisa Severin, MD  docusate sodium (COLACE) 100 MG capsule Take 1 capsule (100 mg total) by mouth 2 (two) times daily. Patient not taking: Reported on 09/06/2016 08/22/16   Renne Musca, MD  docusate sodium (COLACE) 250 MG capsule Take 1 capsule (250 mg total) by mouth daily. Patient not taking: Reported on 09/06/2016 07/24/16   Mercedes Camprubi-Soms, PA-C  ondansetron (ZOFRAN ODT) 4 MG disintegrating tablet Take 1 tablet (4 mg total) by mouth every 8 (eight) hours as needed for nausea or vomiting. Patient not taking: Reported on 09/06/2016 07/24/16   Mercedes Camprubi-Soms, PA-C     Allergies  Allergen Reactions  . Dilaudid [Hydromorphone Hcl] Itching  . Hydrocodone Itching  . Latex Itching and Other (See Comments)    leaves marks on skin       Objective:  Physical Exam  Constitutional: She is oriented to person, place, and  time. She appears well-developed and well-nourished. She is active and cooperative. No distress.  BP 118/76 (BP Location: Right Arm, Patient Position: Sitting, Cuff Size: Large)   Pulse 88   Temp 98.2 F (36.8 C) (Oral)   Resp 16   Ht 5\' 3"  (1.6 m)   Wt 263 lb 12.8 oz (119.7 kg)   LMP 05/08/2016 (Exact Date)   SpO2 97%   BMI 46.73 kg/m   HENT:  Head: Normocephalic and atraumatic.  Right Ear: Hearing normal.  Left Ear: Hearing normal.  Eyes: Conjunctivae are normal. No scleral icterus.  Neck: Normal range of motion. Neck supple. No thyromegaly present.  Cardiovascular: Normal rate, regular rhythm and normal heart sounds.   Pulses:      Radial pulses are 2+ on the right side, and 2+ on the left side.  Pulmonary/Chest: Effort normal and breath sounds normal.  Musculoskeletal:       Left shoulder: Normal.       Left elbow: She exhibits normal range of motion, no swelling, no effusion, no deformity and no laceration. Tenderness found. Lateral  epicondyle tenderness noted. No radial head, no medial epicondyle and no olecranon process tenderness noted.       Left wrist: Normal.       Left knee: Normal.       Left ankle: Normal. Achilles tendon normal.       Cervical back: Normal.       Left upper arm: Normal.       Left forearm: Normal.       Left hand: Normal. Normal sensation noted. Normal strength noted.       Left upper leg: Normal.       Left lower leg: She exhibits no tenderness, no bony tenderness, no swelling, no edema and no laceration.       Legs: Lymphadenopathy:       Head (right side): No tonsillar, no preauricular, no posterior auricular and no occipital adenopathy present.       Head (left side): No tonsillar, no preauricular, no posterior auricular and no occipital adenopathy present.    She has no cervical adenopathy.       Right: No supraclavicular adenopathy present.       Left: No supraclavicular adenopathy present.  Neurological: She is alert and oriented to person, place, and time. No sensory deficit.  Skin: Skin is warm, dry and intact. No rash noted. No cyanosis or erythema. Nails show no clubbing.  Psychiatric: She has a normal mood and affect. Her speech is normal and behavior is normal.           Assessment & Plan:   1. Chronic pancreatitis, unspecified pancreatitis type (HCC) It's unclear if her abdominal pain was due to the pancreatitis, constipation was certainly a contributing issue and the pancreatitis may have been incidental. - Comprehensive metabolic panel - Care order/instruction:  2. Constipation, unspecified constipation type Seems improved, but she isn't taking meds regularly. Encouraged her to continue the colace, and use the miralax as often as she needs. - docusate sodium (COLACE) 250 MG capsule; Take 1 capsule (250 mg total) by mouth daily.  Dispense: 30 capsule; Refill: 0  3. Myalgia This is ongoing. Back pain is chronic. Leg pain x 6 weeks. Trial of celebrex (stop ibuprofen  due to GERD). Prefer to increase her activity rather than minimize it by providing a handicap placard. - celecoxib (CELEBREX) 200 MG capsule; Take 1 capsule (200 mg total) by mouth 2 (two) times daily.  Dispense: 60 capsule; Refill: 1  4. Lateral epicondylitis of left elbow Ice. Celebrex. - celecoxib (CELEBREX) 200 MG capsule; Take 1 capsule (200 mg total) by mouth 2 (two) times daily.  Dispense: 60 capsule; Refill: 1  5. Iron deficiency anemia, unspecified iron deficiency anemia type Continue iron supplementation. - CBC with Differential/Platelet   Return in about 4 weeks (around 10/04/2016).   Fernande Bras, PA-C Physician Assistant-Certified [facility]

## 2016-09-06 NOTE — Patient Instructions (Signed)
     IF you received an x-ray today, you will receive an invoice from [facility]. Please contact  Radiology at [phone] with questions or concerns regarding your invoice.   IF you received labwork today, you will receive an invoice from [facility]. Please contact [facility] at [phone] with questions or concerns regarding your invoice.   Our billing staff will not be able to assist you with questions regarding bills from these companies.  You will be contacted with the lab results as soon as they are available. The fastest way to get your results is to activate your My Chart account. Instructions are located on the last page of this paperwork. If you have not heard from us regarding the results in 2 weeks, please contact this office.     

## 2016-09-07 LAB — CBC WITH DIFFERENTIAL/PLATELET
BASOS: 1 %
Basophils Absolute: 0.1 10*3/uL (ref 0.0–0.2)
EOS (ABSOLUTE): 0.1 10*3/uL (ref 0.0–0.4)
Eos: 1 %
HEMOGLOBIN: 11 g/dL — AB (ref 11.1–15.9)
Hematocrit: 36.6 % (ref 34.0–46.6)
IMMATURE GRANS (ABS): 0 10*3/uL (ref 0.0–0.1)
Immature Granulocytes: 0 %
LYMPHS: 48 %
Lymphocytes Absolute: 3.7 10*3/uL — ABNORMAL HIGH (ref 0.7–3.1)
MCH: 23.7 pg — AB (ref 26.6–33.0)
MCHC: 30.1 g/dL — ABNORMAL LOW (ref 31.5–35.7)
MCV: 79 fL (ref 79–97)
MONOCYTES: 5 %
Monocytes Absolute: 0.4 10*3/uL (ref 0.1–0.9)
NEUTROS ABS: 3.4 10*3/uL (ref 1.4–7.0)
Neutrophils: 45 %
Platelets: 621 10*3/uL — ABNORMAL HIGH (ref 150–379)
RBC: 4.64 x10E6/uL (ref 3.77–5.28)
RDW: 20.4 % — ABNORMAL HIGH (ref 12.3–15.4)
WBC: 7.6 10*3/uL (ref 3.4–10.8)

## 2016-09-07 LAB — COMPREHENSIVE METABOLIC PANEL
A/G RATIO: 1 — AB (ref 1.2–2.2)
ALBUMIN: 4.4 g/dL (ref 3.5–5.5)
ALT: 12 IU/L (ref 0–32)
AST: 18 IU/L (ref 0–40)
Alkaline Phosphatase: 84 IU/L (ref 39–117)
BILIRUBIN TOTAL: 0.4 mg/dL (ref 0.0–1.2)
BUN / CREAT RATIO: 18 (ref 9–23)
BUN: 21 mg/dL (ref 6–24)
CHLORIDE: 95 mmol/L — AB (ref 96–106)
CO2: 24 mmol/L (ref 18–29)
Calcium: 8.9 mg/dL (ref 8.7–10.2)
Creatinine, Ser: 1.17 mg/dL — ABNORMAL HIGH (ref 0.57–1.00)
GFR calc non Af Amer: 55 mL/min/{1.73_m2} — ABNORMAL LOW (ref >59)
GFR, EST AFRICAN AMERICAN: 63 mL/min/{1.73_m2} (ref >59)
GLOBULIN, TOTAL: 4.5 g/dL (ref 1.5–4.5)
Glucose: 94 mg/dL (ref 65–99)
POTASSIUM: 4.2 mmol/L (ref 3.5–5.2)
Sodium: 139 mmol/L (ref 134–144)
TOTAL PROTEIN: 8.9 g/dL — AB (ref 6.0–8.5)

## 2016-09-25 ENCOUNTER — Other Ambulatory Visit: Payer: Self-pay | Admitting: Family Medicine

## 2016-09-25 ENCOUNTER — Other Ambulatory Visit: Payer: Self-pay | Admitting: Physician Assistant

## 2016-11-12 ENCOUNTER — Ambulatory Visit (INDEPENDENT_AMBULATORY_CARE_PROVIDER_SITE_OTHER): Payer: BLUE CROSS/BLUE SHIELD | Admitting: Physician Assistant

## 2016-11-12 VITALS — BP 122/88 | HR 93 | Temp 97.5°F | Resp 18 | Ht 63.0 in | Wt 272.2 lb

## 2016-11-12 DIAGNOSIS — I10 Essential (primary) hypertension: Secondary | ICD-10-CM

## 2016-11-12 DIAGNOSIS — E785 Hyperlipidemia, unspecified: Secondary | ICD-10-CM | POA: Diagnosis not present

## 2016-11-12 DIAGNOSIS — Z6841 Body Mass Index (BMI) 40.0 and over, adult: Secondary | ICD-10-CM

## 2016-11-12 DIAGNOSIS — M791 Myalgia, unspecified site: Secondary | ICD-10-CM

## 2016-11-12 DIAGNOSIS — D509 Iron deficiency anemia, unspecified: Secondary | ICD-10-CM

## 2016-11-12 DIAGNOSIS — M2550 Pain in unspecified joint: Secondary | ICD-10-CM | POA: Diagnosis not present

## 2016-11-12 DIAGNOSIS — R0683 Snoring: Secondary | ICD-10-CM

## 2016-11-12 DIAGNOSIS — E119 Type 2 diabetes mellitus without complications: Secondary | ICD-10-CM | POA: Diagnosis not present

## 2016-11-12 DIAGNOSIS — E890 Postprocedural hypothyroidism: Secondary | ICD-10-CM | POA: Diagnosis not present

## 2016-11-12 MED ORDER — LISINOPRIL 5 MG PO TABS: 5.0000 mg | ORAL_TABLET | Freq: Every day | ORAL | 3 refills | Status: DC

## 2016-11-12 MED ORDER — CYCLOBENZAPRINE HCL 10 MG PO TABS: 10.0000 mg | ORAL_TABLET | Freq: Three times a day (TID) | ORAL | 0 refills | Status: AC | PRN

## 2016-11-12 NOTE — Patient Instructions (Addendum)
When checking your blood pressure, make sure you are sitting for at least 5 minutes before attempting to check it. Feel free to switch arms if you get a reading that's outside "normal" for you. If it continues to be high (above 130/90) come back and see [place] for further evaluation. Also if you can purchase a home BP machine to check at home.     IF you received an x-ray today, you will receive an invoice from [facility]. Please contact [facility] at [phone] with questions or concerns regarding your invoice.   IF you received labwork today, you will receive an invoice from [facility]. Please contact [facility] at [phone] with questions or concerns regarding your invoice.   Our billing staff will not be able to assist you with questions regarding bills from these companies.  You will be contacted with the lab results as soon as they are available. The fastest way to get your results is to activate your My Chart account. Instructions are located on the last page of this paperwork. If you have not heard from [place] regarding the results in 2 weeks, please contact this office.

## 2016-11-12 NOTE — Progress Notes (Signed)
Patient ID: Emily Mathis, female    DOB: 09/26/1965, 51 y.o.   MRN: [id number]  PCP: Porfirio Oar, PA-C  Chief Complaint  Patient presents with  . Follow-up  . Back Pain  . Hand Pain    right hand     Subjective:   Presents for evaluation of elevated blood pressure. She is accompanied by her daughter, [name], and grandson.  In addition, she complains of upper back pain and pain and tingling in the RIGHT wrist and hand.  She has not seen her endocrinologist in some time. She relates that she had an appointment a couple of weeks ago, but had to reschedule it because she didn't have the copay. She doesn't know when she last had her thyroid or A1C checked. Thinks that her home glucose is well controlled, but doesn't check regularly. Experiences fatigue, but denies polydipsia and polyuria.  Reports that BP has been elevated lately when she checks it at her local pharmacy. She takes photos of the results using her phone camera. Readings are 180's/110's. She takes lisinopril 2.5 mg for renal protection, and has not previously been diagnosed with HTN.  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 She found it helpful, and stopped using it when her symptoms resolved. Has not resumed use with recurrence of symptoms.   Asks why she snores so loudly. Sometimes she wakes herself up. Doesn't think she rouses from sleep gasping or choking, but does not feel rested upon waking. Her daughter lives with her and complains of the loud snoring, but doesn't pay enough attention to know if she has pauses in breathing.      Review of Systems  Constitutional: Positive for fatigue. Negative for activity change, appetite change, chills, diaphoresis, fever and unexpected weight change.  HENT: Negative for congestion, sore throat and trouble swallowing.   Eyes: Negative for visual disturbance.  Respiratory: Negative for apnea (but loud snoring), cough, choking, chest  tightness, shortness of breath, wheezing and stridor.   Cardiovascular: Negative for chest pain, palpitations and leg swelling.  Gastrointestinal: Negative for abdominal distention, abdominal pain, constipation, diarrhea, nausea and vomiting.  Endocrine: Negative for cold intolerance, heat intolerance, polydipsia, polyphagia and polyuria.  Musculoskeletal: Positive for arthralgias (RIGHT wrist) and back pain. Negative for gait problem, joint swelling, myalgias, neck pain and neck stiffness.  Skin: Negative for pallor, rash and wound.  Neurological: Positive for numbness (RIGHT hand). Negative for dizziness, tremors, weakness, light-headedness and headaches.  Hematological: Negative for adenopathy. Does not bruise/bleed easily.  Psychiatric/Behavioral: Negative for dysphoric mood and self-injury. The patient is not nervous/anxious.        Patient Active Problem List   Diagnosis Date Noted  . Hypothyroidism   . Abdominal pain   . Chronic pancreatitis (HCC)   . Constipation   . Pancreatitis 08/19/2016  . Status post laparoscopic hysterectomy 06/11/2016  . Iron deficiency anemia 02/15/2016  . Myalgia 02/15/2016  . Upper airway cough syndrome 10/21/2015  . Polyarthralgia 06/16/2015  . Diabetes mellitus type 2, uncomplicated (HCC) 09/14/2013  . Keloid scar, cervical incision 09/02/2013  . Reaction, situational, acute, to stress 07/02/2013  . 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 Severe obesity (BMI >= 40) (HCC) 03/27/2013  . GERD (gastroesophageal reflux disease) 02/15/2013  . Hypocalcemia 01/07/2013  . Chest pain on exertion 07/24/2011     Prior to Admission medications   Medication Sig Start Date End Date Taking? Authorizing Provider  albuterol (PROVENTIL HFA;VENTOLIN HFA) 108 (90  Base) MCG/ACT inhaler Inhale 2 puffs into the lungs daily as needed. Reported on 01/04/2016 03/28/16  Yes Senay Sistrunk Leotis Shames, PA-C  ALPRAZolam (XANAX) 0.5 MG tablet Take 1 tablet (0.5 mg total) by mouth  at bedtime as needed. for sleep 07/03/16  Yes Harmon Bommarito, PA-C  Canagliflozin-Metformin HCl (INVOKAMET) 150-500 MG TABS Take 1 tablet by mouth daily. 04/25/16  Yes Daequan Kozma, PA-C  celecoxib (CELEBREX) 200 MG capsule Take 1 capsule (200 mg total) by mouth 2 (two) times daily. 09/06/16  Yes Keriann Rankin, PA-C  chlorthalidone (HYGROTON) 25 MG tablet TAKE 1 TABLET(25 MG) BY MOUTH DAILY 09/05/16  Yes Davonte Siebenaler, PA-C  Cholecalciferol (VITAMIN D3) 50000 units CAPS Take 50,000 Units by mouth every Monday.  06/14/16  Yes Historical Provider, MD  diazepam (VALIUM) 5 MG tablet Take 1 tablet (5 mg total) by mouth daily. 07/03/16  Yes Keelie Zemanek, PA-C  docusate sodium (COLACE) 250 MG capsule Take 1 capsule (250 mg total) by mouth daily. 09/06/16  Yes Bryttani Blew, PA-C  ferrous sulfate 325 (65 FE) MG tablet Take 325 mg by mouth 3 (three) times daily with meals.   Yes Historical Provider, MD  glycerin adult 2 g suppository Place 1 suppository rectally as needed for constipation.   Yes Historical Provider, MD  levothyroxine (SYNTHROID, LEVOTHROID) 300 MCG tablet Take 1 tablet (300 mcg total) by mouth daily. 06/17/15  Yes Madline Oesterling, PA-C  lisinopril (PRINIVIL,ZESTRIL) 2.5 MG tablet TAKE 1 TABLET BY MOUTH EVERY DAY 09/25/16  Yes Margueritte Guthridge, PA-C  nystatin (MYCOSTATIN) powder Apply topically 4 (four) times daily. Patient taking differently: Apply topically 4 (four) times daily as needed (For heat rash.).  10/02/15  Yes Mckennon Zwart, PA-C  pantoprazole (PROTONIX) 40 MG tablet TAKE 1 TABLET BY MOUTH TWICE DAILY 09/25/16  Yes Harmonee Tozer, PA-C  polyethylene glycol (MIRALAX / GLYCOLAX) packet Take 17 g by mouth 2 (two) times daily. Take 2 packets today (day one), wait 2 hours, if no stool produced then take 2 more packets on day one; then continue taking 2 packets daily until you achieve daily soft stools; if water stool develops then cut back to 1 packet daily. 07/24/16  Yes Mercedes Street, PA-C   potassium chloride SA (K-DUR,KLOR-CON) 20 MEQ tablet Take 1 tablet (20 mEq total) by mouth 2 (two) times daily. 06/12/16  Yes Brook E Ardell Isaacs, MD  senna (SENOKOT) 8.6 MG TABS tablet Take 1 tablet (8.6 mg total) by mouth daily as needed for mild constipation. 08/22/16  Yes Asiyah Mayra Reel, MD  sertraline (ZOLOFT) 100 MG tablet TAKE 1 TABLET BY MOUTH EVERY DAY 08/18/16  Yes Shade Flood, MD  sucralfate (CARAFATE) 1 G tablet Take 1 tablet (1 g total) by mouth 4 (four) times daily. 04/19/15  Yes Marisa Severin, MD  ondansetron (ZOFRAN ODT) 4 MG disintegrating tablet Take 1 tablet (4 mg total) by mouth every 8 (eight) hours as needed for nausea or vomiting. Patient not taking: Reported on 09/06/2016 07/24/16   [facility], PA-C     Allergies  Allergen Reactions  . Dilaudid [Hydromorphone Hcl] Itching  . Hydrocodone Itching  . Latex Itching and Other (See Comments)    leaves marks on skin       Objective:  Physical Exam  Constitutional: She is oriented to person, place, and time. She appears well-developed and well-nourished. She is active and cooperative. No distress.  BP 122/88 (BP Location: Right Arm, Patient Position: Sitting, Cuff Size: Large)   Pulse 93  Temp 97.5 F (36.4 C) (Oral)   Resp 18   Ht 5\' 3"  (1.6 m)   Wt 272 lb 3.2 oz (123.5 kg)   LMP 05/08/2016 (Exact Date)   SpO2 95%   BMI 48.22 kg/m   HENT:  Head: Normocephalic and atraumatic.  Right Ear: Hearing normal.  Left Ear: Hearing normal.  Eyes: Conjunctivae are normal. No scleral icterus.  Neck: Normal range of motion. Neck supple. No thyromegaly present.  Cardiovascular: Normal rate, regular rhythm and normal heart sounds.   Pulses:      Radial pulses are 2+ on the right side, and 2+ on the left side.  Pulmonary/Chest: Effort normal and breath sounds normal.  Musculoskeletal:       Cervical back: Normal.       Thoracic back: Normal.       Lumbar back: Normal.  Lymphadenopathy:       Head (right  side): No tonsillar, no preauricular, no posterior auricular and no occipital adenopathy present.       Head (left side): No tonsillar, no preauricular, no posterior auricular and no occipital adenopathy present.    She has no cervical adenopathy.       Right: No supraclavicular adenopathy present.       Left: No supraclavicular adenopathy present.  Neurological: She is alert and oriented to person, place, and time. No sensory deficit.  Skin: Skin is warm, dry and intact. No rash noted. No cyanosis or erythema. Nails show no clubbing.  Psychiatric: She has a normal mood and affect. Her speech is normal and behavior is normal.       BP Readings from Last 3 Encounters:  11/12/16 122/88  09/06/16 118/76  08/22/16 131/84    Wt Readings from Last 3 Encounters:  11/12/16 272 lb 3.2 oz (123.5 kg)  09/06/16 263 lb 12.8 oz (119.7 kg)  07/27/16 268 lb 3.2 oz (121.7 kg)       Assessment & Plan:   1. Essential hypertension Normal BP here, but consistently elevated in the community. Increase lisinopril to 5 mg daily and continue to monitor. - lisinopril (PRINIVIL,ZESTRIL) 5 MG tablet; Take 1 tablet (5 mg total) by mouth daily.  Dispense: 90 tablet; Refill: 3  2. Polyarthralgia Resume use of RIGHT wrist splint. If no improvement, will update radiographs. May need evaluation with hand.  3. Myalgia - cyclobenzaprine (FLEXERIL) 10 MG tablet; Take 1 tablet (10 mg total) by mouth 3 (three) times daily as needed for muscle spasms.  Dispense: 30 tablet; Refill: 0  4. BMI 45.0-49.9, adult (HCC) Needs help with weight loss. Will benefit multiple issues. - Amb Ref to Medical Weight Management  5. Iron deficiency anemia, unspecified iron deficiency anemia type Thought due to heavy menses. S/p hysterectomy. Update CBC. - CBC with Differential/Platelet  6. Hypothyroidism, postsurgical Update labs. Will forward to endocrinology. She is encouraned to schedule follow-up. - T4, free - TSH  7.  Type 2 diabetes mellitus without complication, without long-term current use of insulin (HCC) Update labs. Will forward to endocrinology. She is encouraned to schedule follow-up. - Hemoglobin A1c - Comprehensive metabolic panel - Lipid panel  8. Snoring She is at risk for OSA. Treatment would benefit BP and weight. - Ambulatory referral to Sleep Studies   Fernande Bras, PA-C Physician Assistant-Certified Primary Care at [facility]

## 2016-11-12 NOTE — Progress Notes (Deleted)
Patient ID: Emily Mathis, female    DOB: May 30, 1966, 51 y.o.   MRN: [id number]  PCP: Harrison Mons, PA-C  Chief Complaint  Patient presents with  . Follow-up  . Back Pain  . Hand Pain    right hand     Subjective:   Presents for evaluation of high blood pressure, Arthritis and muscle spasms of back and neck. Pt states her blood pressure has been elevated when she has it checked at [facility] or [facility] (189/110) but in the office and in subsequent visits its been WNL. She is currently taking Lisinopril 2.5mg  for DM. She has a history of Diabetes mellitus 2, arthralgia, myalgia, pancreatitis, Hypothyroidism and a BMI of 45-49%. She also is complaining of upper back pain/cervial tenderness intermittently as well as nerve pain in her wrist and tingling in her fingers (right hand). The upper back and cervial tenderness are attributed to a previously diagnosed arthritis which has caused pain her upper back before. She is taking Celebrex 200mg  BID for pain but is not finding relief.   Her DM and Hypothyroidism are supposedly being managed by her Endocrinologist which she hasn't seen for several months and states "needs to get into be seen". She denies polyuria, polydipsia, but does complain of fatigue. She states they are still "messing with her levels of levothyroxine" but once they have found the right dose/response she thinks she will feel better.   She was also admitted on 08/19/2016 for her Chronic Pancreatitis.  When asked if there was anything else she also mentioned she has had trouble with snoring and wants to know what could be causing it.   She also states she is actively trying to eat better and exercise and "her sister" is going to try to be active with her.  Review of Systems  Constitutional: Negative.   HENT: Negative.   Eyes: Negative.   Respiratory: Positive for choking (States she has awakened from sleep). Negative for shortness of breath, wheezing and stridor.     Cardiovascular: Negative.   Gastrointestinal: Negative.   Endocrine: Negative for polydipsia and polyuria.  Genitourinary: Negative.   Musculoskeletal: Positive for arthralgias, myalgias and neck pain.  Skin: Negative.   Allergic/Immunologic: Negative.   Neurological: Negative.   Hematological: Negative.   Psychiatric/Behavioral: Negative.   All other systems reviewed and are negative.   Patient Active Problem List   Diagnosis Date Noted  . Hypothyroidism   . Abdominal pain   . Chronic pancreatitis ([place])   . Constipation   . Pancreatitis 08/19/2016  . Status post laparoscopic hysterectomy 06/11/2016  . Iron deficiency anemia 02/15/2016  . Myalgia 02/15/2016  . Upper airway cough syndrome 10/21/2015  . Polyarthralgia 06/16/2015  . Diabetes mellitus type 2, uncomplicated ([place]) [id number]  . Keloid scar, cervical incision 09/02/2013  . Reaction, situational, acute, to stress 07/02/2013  . 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 Severe obesity (BMI >= 40) ([place]) 03/27/2013  . GERD (gastroesophageal reflux disease) 02/15/2013  . Hypocalcemia 01/07/2013  . Chest pain on exertion 07/24/2011     Prior to Admission medications   Medication Sig Start Date End Date Taking? Authorizing Provider  albuterol (PROVENTIL HFA;VENTOLIN HFA) 108 (90 Base) MCG/ACT inhaler Inhale 2 puffs into the lungs daily as needed. Reported on 01/04/2016 03/28/16  Yes [name] Jacqulynn Cadet, PA-C  ALPRAZolam (XANAX) 0.5 MG tablet Take 1 tablet (0.5 mg total) by mouth at bedtime as needed. for sleep 07/03/16  Yes [name] Jeffery, PA-C  Canagliflozin-Metformin HCl (INVOKAMET) 150-500 MG TABS  Take 1 tablet by mouth daily. 04/25/16  Yes [name] Jeffery, PA-C  celecoxib (CELEBREX) 200 MG capsule Take 1 capsule (200 mg total) by mouth 2 (two) times daily. 09/06/16  Yes [name] Jeffery, PA-C  chlorthalidone (HYGROTON) 25 MG tablet TAKE 1 TABLET(25 MG) BY MOUTH DAILY 09/05/16  Yes [name] Jeffery, PA-C  Cholecalciferol (VITAMIN  D3) 50000 units CAPS Take 50,000 Units by mouth every Monday.  06/14/16  Yes Historical Provider, MD  diazepam (VALIUM) 5 MG tablet Take 1 tablet (5 mg total) by mouth daily. 07/03/16  Yes [name] Jeffery, PA-C  docusate sodium (COLACE) 250 MG capsule Take 1 capsule (250 mg total) by mouth daily. 09/06/16  Yes [name] Jeffery, PA-C  ferrous sulfate 325 (65 FE) MG tablet Take 325 mg by mouth 3 (three) times daily with meals.   Yes Historical Provider, MD  glycerin adult 2 g suppository Place 1 suppository rectally as needed for constipation.   Yes Historical Provider, MD  levothyroxine (SYNTHROID, LEVOTHROID) 300 MCG tablet Take 1 tablet (300 mcg total) by mouth daily. 06/17/15  Yes [name] Jeffery, PA-C  lisinopril (PRINIVIL,ZESTRIL) 2.5 MG tablet TAKE 1 TABLET BY MOUTH EVERY DAY 09/25/16  Yes [name] Jeffery, PA-C  nystatin (MYCOSTATIN) powder Apply topically 4 (four) times daily. Patient taking differently: Apply topically 4 (four) times daily as needed (For heat rash.).  10/02/15  Yes [name] Jeffery, PA-C  pantoprazole (PROTONIX) 40 MG tablet TAKE 1 TABLET BY MOUTH TWICE DAILY 09/25/16  Yes [name] Jeffery, PA-C  polyethylene glycol (MIRALAX / GLYCOLAX) packet Take 17 g by mouth 2 (two) times daily. Take 2 packets today (day one), wait 2 hours, if no stool produced then take 2 more packets on day one; then continue taking 2 packets daily until you achieve daily soft stools; if water stool develops then cut back to 1 packet daily. 07/24/16  Yes [place], PA-C  potassium chloride SA (K-DUR,KLOR-CON) 20 MEQ tablet Take 1 tablet (20 mEq total) by mouth 2 (two) times daily. 06/12/16  Yes Brook E Yisroel Ramming, MD  senna (SENOKOT) 8.6 MG TABS tablet Take 1 tablet (8.6 mg total) by mouth daily as needed for mild constipation. 08/22/16  Yes Asiyah Cletis Media, MD  sertraline (ZOLOFT) 100 MG tablet TAKE 1 TABLET BY MOUTH EVERY DAY 08/18/16  Yes Wendie Agreste, MD  sucralfate (CARAFATE) 1 G tablet Take 1 tablet  (1 g total) by mouth 4 (four) times daily. 04/19/15  Yes Linton Flemings, MD  ondansetron (ZOFRAN ODT) 4 MG disintegrating tablet Take 1 tablet (4 mg total) by mouth every 8 (eight) hours as needed for nausea or vomiting. Patient not taking: Reported on 09/06/2016 07/24/16   [facility], PA-C     Allergies  Allergen Reactions  . Dilaudid [Hydromorphone Hcl] Itching  . Hydrocodone Itching  . Latex Itching and Other (See Comments)    leaves marks on skin       Objective:  Physical Exam  Constitutional: She is oriented to person, place, and time. She appears well-developed and well-nourished. She is cooperative.  Blood pressure 122/88, pulse 93, temperature 97.5 F (36.4 C), temperature source Oral, resp. rate 18, height 5\' 3"  (1.6 m), weight 272 lb 3.2 oz (123.5 kg), last menstrual period 05/08/2016, SpO2 95 %.  Eyes: Pupils are equal, round, and reactive to light.  Neck: Trachea normal and normal range of motion. Neck supple.  Cardiovascular: Normal rate, regular rhythm, normal heart sounds and normal pulses.   Pulses:  Radial pulses are 2+ on the right side, and 2+ on the left side.  Pulmonary/Chest: Effort normal and breath sounds normal.  Lymphadenopathy:    She has no cervical adenopathy.    She has no axillary adenopathy.  Neurological: She is alert and oriented to person, place, and time. She has normal strength. She displays a negative Romberg sign.  Skin: Skin is warm and dry.  Psychiatric: She has a normal mood and affect. Her speech is normal and behavior is normal. Judgment and thought content normal.  Speech wouldn't be classified as "slow" but she does take a moment longer to answer than expected however she is consistent and regular in her tempo. She also seems to need more explanation of healthcare topics  Vitals reviewed.   Assessment & Plan:  Note should be submitted to Endocrinologist and requested note from his office to establish care communication and endure  no gaps or oversight in healthcare.  1. Essential hypertension - lisinopril (PRINIVIL,ZESTRIL) 5 MG tablet; Take 1 tablet (5 mg total) by mouth daily.  Dispense: 90 tablet; Refill: 3  2. Polyarthralgia 3. Myalgia - cyclobenzaprine (FLEXERIL) 10 MG tablet; Take 1 tablet (10 mg total) by mouth 3 (three) times daily as needed for muscle spasms.  Dispense: 30 tablet; Refill: 0  4. BMI 45.0-49.9, adult (HCC) - Amb Ref to Medical Weight Management  5. Iron deficiency anemia, unspecified iron deficiency anemia type - CBC with Differential/Platelet  6. Hypothyroidism, postsurgical - T4, free - TSH  7. Type 2 diabetes mellitus without complication, without long-term current use of insulin (HCC) - Hemoglobin A1c - Comprehensive metabolic panel - Lipid panel  8. Snoring - Ambulatory referral to Sleep Studies

## 2016-11-13 LAB — CBC WITH DIFFERENTIAL/PLATELET
Basophils Absolute: 0 10*3/uL (ref 0.0–0.2)
Basos: 1 %
EOS (ABSOLUTE): 0.1 10*3/uL (ref 0.0–0.4)
EOS: 1 %
HEMATOCRIT: 38.1 % (ref 34.0–46.6)
HEMOGLOBIN: 12 g/dL (ref 11.1–15.9)
Immature Grans (Abs): 0 10*3/uL (ref 0.0–0.1)
Immature Granulocytes: 0 %
LYMPHS ABS: 3.2 10*3/uL — AB (ref 0.7–3.1)
Lymphs: 46 %
MCH: 26.8 pg (ref 26.6–33.0)
MCHC: 31.5 g/dL (ref 31.5–35.7)
MCV: 85 fL (ref 79–97)
MONOCYTES: 3 %
MONOS ABS: 0.2 10*3/uL (ref 0.1–0.9)
NEUTROS ABS: 3.4 10*3/uL (ref 1.4–7.0)
Neutrophils: 49 %
Platelets: 466 10*3/uL — ABNORMAL HIGH (ref 150–379)
RBC: 4.47 x10E6/uL (ref 3.77–5.28)
RDW: 21.2 % — AB (ref 12.3–15.4)
WBC: 7 10*3/uL (ref 3.4–10.8)

## 2016-11-13 LAB — LIPID PANEL
CHOL/HDL RATIO: 3.2 ratio (ref 0.0–4.4)
Cholesterol, Total: 243 mg/dL — ABNORMAL HIGH (ref 100–199)
HDL: 76 mg/dL (ref >39)
LDL Calculated: 147 mg/dL — ABNORMAL HIGH (ref 0–99)
Triglycerides: 100 mg/dL (ref 0–149)
VLDL Cholesterol Cal: 20 mg/dL (ref 5–40)

## 2016-11-13 LAB — COMPREHENSIVE METABOLIC PANEL
A/G RATIO: 1.2 (ref 1.2–2.2)
ALBUMIN: 4.5 g/dL (ref 3.5–5.5)
ALT: 19 IU/L (ref 0–32)
AST: 47 IU/L — ABNORMAL HIGH (ref 0–40)
Alkaline Phosphatase: 69 IU/L (ref 39–117)
BILIRUBIN TOTAL: 0.5 mg/dL (ref 0.0–1.2)
BUN / CREAT RATIO: 12 (ref 9–23)
BUN: 14 mg/dL (ref 6–24)
CALCIUM: 8.9 mg/dL (ref 8.7–10.2)
CHLORIDE: 94 mmol/L — AB (ref 96–106)
CO2: 28 mmol/L (ref 18–29)
Creatinine, Ser: 1.2 mg/dL — ABNORMAL HIGH (ref 0.57–1.00)
GFR, EST AFRICAN AMERICAN: 61 mL/min/{1.73_m2} (ref >59)
GFR, EST NON AFRICAN AMERICAN: 53 mL/min/{1.73_m2} — AB (ref >59)
GLOBULIN, TOTAL: 3.7 g/dL (ref 1.5–4.5)
Glucose: 102 mg/dL — ABNORMAL HIGH (ref 65–99)
POTASSIUM: 3.6 mmol/L (ref 3.5–5.2)
SODIUM: 140 mmol/L (ref 134–144)
TOTAL PROTEIN: 8.2 g/dL (ref 6.0–8.5)

## 2016-11-13 LAB — HEMOGLOBIN A1C
Est. average glucose Bld gHb Est-mCnc: 128 mg/dL
Hgb A1c MFr Bld: 6.1 % — ABNORMAL HIGH (ref 4.8–5.6)

## 2016-11-13 LAB — TSH: TSH: 92.54 u[IU]/mL — AB (ref 0.450–4.500)

## 2016-11-13 LAB — T4, FREE

## 2016-11-13 MED ORDER — ATORVASTATIN CALCIUM 20 MG PO TABS: 20.0000 mg | ORAL_TABLET | Freq: Every day | ORAL | 3 refills | Status: DC

## 2016-11-19 ENCOUNTER — Ambulatory Visit (INDEPENDENT_AMBULATORY_CARE_PROVIDER_SITE_OTHER): Payer: BLUE CROSS/BLUE SHIELD | Admitting: Physician Assistant

## 2016-11-19 ENCOUNTER — Encounter: Payer: Self-pay | Admitting: Physician Assistant

## 2016-11-19 VITALS — BP 138/87 | HR 100 | Temp 98.9°F | Resp 17 | Ht 63.0 in | Wt 273.0 lb

## 2016-11-19 DIAGNOSIS — R238 Other skin changes: Secondary | ICD-10-CM

## 2016-11-19 MED ORDER — VALACYCLOVIR HCL 1 G PO TABS: 1000.0000 mg | ORAL_TABLET | Freq: Three times a day (TID) | ORAL | 0 refills | Status: DC

## 2016-11-19 MED ORDER — DOXYCYCLINE HYCLATE 100 MG PO CAPS: 100.0000 mg | ORAL_CAPSULE | Freq: Two times a day (BID) | ORAL | 0 refills | Status: AC

## 2016-11-19 NOTE — Patient Instructions (Signed)
     IF you received an x-ray today, you will receive an invoice from [facility]. Please contact [facility] at [phone] with questions or concerns regarding your invoice.   IF you received labwork today, you will receive an invoice from [facility]. Please contact [facility] at [phone] with questions or concerns regarding your invoice.   Our billing staff will not be able to assist you with questions regarding bills from these companies.  You will be contacted with the lab results as soon as they are available. The fastest way to get your results is to activate your My Chart account. Instructions are located on the last page of this paperwork. If you have not heard from us regarding the results in 2 weeks, please contact this office.     

## 2016-11-19 NOTE — Progress Notes (Signed)
11/22/2016 8:27 AM   DOB: 10/28/1965 / MRN: [id number]  SUBJECTIVE:  Emily Mathis is a 51 y.o. female presenting for a rash on the posterior right thigh.  This started with some mild itching and then progressed to "white blisters."  She denies pain.  She feels the rash is improving.  She has not tried anything on the rash.  Denies new medications.  She is moving her bowels normally for her.  She tells me she had a spider bite to the area "a long time ago," and this is a re-emergence of that.    She is allergic to dilaudid [hydromorphone hcl]; hydrocodone; and latex.   She  has a past medical history of Abnormal Pap smear of cervix (1990); Abnormal uterine bleeding; Anemia (02/20/16); Anxiety; Arthritis; Asthma; Cancer ([place]); Chest pain; Diabetes mellitus without complication ([place]); Fibroid; Generalized pain; GERD (gastroesophageal reflux disease); Headache(784.0); History of blood transfusion (02/20/2016); Hyperlipidemia; Hypertension; Hypothyroidism; Leg swelling; Neuromuscular disorder ([place]); Shortness of breath dyspnea; and Thyroid disease.    She  reports that she has never smoked. She has never used smokeless tobacco. She reports that she drinks alcohol. She reports that she does not use drugs. She  reports that she currently engages in sexual activity and has had female partners. She reports using the following methods of birth control/protection: Abstinence and Surgical. The patient  has a past surgical history that includes Cesarean section (1995); Cesarean section (1996); Cardiac catheterization (2013); Thyroidectomy (N/A, 12/18/2012); left heart catheterization with coronary angiogram (07/24/2011); Hysteroscopy w/D&C (N/A, 04/10/2016); Upper gi endoscopy; Colonoscopy; Robotic assisted total hysterectomy with salpingectomy (Bilateral, 06/11/2016); and Cystoscopy (N/A, 06/11/2016).  Her family history includes Asthma in her daughter and sister; Breast cancer in her paternal aunt; Cancer in her paternal  aunt and paternal grandmother; Colon cancer in her father; Diabetes in her father; Heart disease in her father and mother; Hypertension in her father, mother, sister, and sister; Mental illness (age of onset: 7) in her daughter; Seizures in her daughter; Stroke in her mother; Thyroid disease in her sister.  Review of Systems  Constitutional: Negative for chills and fever.  Cardiovascular: Negative for chest pain.  Gastrointestinal: Negative for abdominal pain.  Musculoskeletal: Negative for myalgias.  Skin: Positive for rash. Negative for itching.  Neurological: Negative for dizziness.    The problem list and medications were reviewed and updated by myself where necessary and exist elsewhere in the encounter.   OBJECTIVE:  BP 138/87 (BP Location: Right Arm, Patient Position: Sitting, Cuff Size: Large)   Pulse 100   Temp 98.9 F (37.2 C) (Oral)   Resp 17   Ht 5\' 3"  (1.6 m)   Wt 273 lb (123.8 kg)   LMP 05/08/2016 (Exact Date)   SpO2 100%   BMI 48.36 kg/m   Physical Exam  Constitutional: She is oriented to person, place, and time. She appears well-nourished. No distress.  Eyes: EOM are normal. Pupils are equal, round, and reactive to light.  Cardiovascular: Normal rate.   Pulmonary/Chest: Effort normal.  Abdominal: She exhibits no distension.  Neurological: She is alert and oriented to person, place, and time. No cranial nerve deficit. Gait normal.  Skin: Skin is dry. She is not diaphoretic.  Psychiatric: She has a normal mood and affect.  Vitals reviewed.       Results for orders placed or performed in visit on 11/19/16 (from the past 72 hour(s))  WOUND CULTURE     Status: None   Collection Time: 11/19/16  5:58 PM  Result Value Ref Range   Gram Stain Result Final report    Result 1 Comment     Comment: Moderate amount of white blood cells.   RESULT 2 No organisms seen    Aerobic Bacterial Culture Final report    Result 1 Comment     Comment: No growth in 36 - 48  hours.    No results found.  ASSESSMENT AND PLAN:  Emily Mathis was seen today for follow-up.  Diagnoses and all orders for this visit:  Vesicular rash: Given the image above this appears to be a mild and resolving shingles rash.  I will cover with doxy and start valtrex until the wound culture results.   -     WOUND CULTURE -     doxycycline (VIBRAMYCIN) 100 MG capsule; Take 1 capsule (100 mg total) by mouth 2 (two) times daily. -     valACYclovir (VALTREX) 1000 MG tablet; Take 1 tablet (1,000 mg total) by mouth 3 (three) times daily.    The patient is advised to call or return to clinic if she does not see an improvement in symptoms, or to seek the care of the closest emergency department if she worsens with the above plan.   Philis Fendt, MHS, PA-C [facility] 11/22/2016 8:27 AM

## 2016-11-21 LAB — WOUND CULTURE: Organism ID, Bacteria: NONE SEEN

## 2016-11-29 DIAGNOSIS — E890 Postprocedural hypothyroidism: Secondary | ICD-10-CM | POA: Diagnosis not present

## 2016-11-29 DIAGNOSIS — Z1389 Encounter for screening for other disorder: Secondary | ICD-10-CM | POA: Diagnosis not present

## 2016-11-29 DIAGNOSIS — E784 Other hyperlipidemia: Secondary | ICD-10-CM | POA: Diagnosis not present

## 2016-11-29 DIAGNOSIS — D509 Iron deficiency anemia, unspecified: Secondary | ICD-10-CM | POA: Diagnosis not present

## 2016-11-29 DIAGNOSIS — E119 Type 2 diabetes mellitus without complications: Secondary | ICD-10-CM | POA: Diagnosis not present

## 2016-12-06 ENCOUNTER — Encounter (INDEPENDENT_AMBULATORY_CARE_PROVIDER_SITE_OTHER): Payer: BLUE CROSS/BLUE SHIELD | Admitting: Family Medicine

## 2016-12-10 ENCOUNTER — Ambulatory Visit (INDEPENDENT_AMBULATORY_CARE_PROVIDER_SITE_OTHER): Payer: BLUE CROSS/BLUE SHIELD | Admitting: Neurology

## 2016-12-10 ENCOUNTER — Encounter: Payer: Self-pay | Admitting: Neurology

## 2016-12-10 VITALS — BP 130/88 | HR 82 | Resp 16 | Ht 63.0 in | Wt 268.0 lb

## 2016-12-10 DIAGNOSIS — R0681 Apnea, not elsewhere classified: Secondary | ICD-10-CM

## 2016-12-10 DIAGNOSIS — R351 Nocturia: Secondary | ICD-10-CM

## 2016-12-10 DIAGNOSIS — R51 Headache: Secondary | ICD-10-CM | POA: Diagnosis not present

## 2016-12-10 DIAGNOSIS — R0683 Snoring: Secondary | ICD-10-CM | POA: Diagnosis not present

## 2016-12-10 DIAGNOSIS — K861 Other chronic pancreatitis: Secondary | ICD-10-CM

## 2016-12-10 DIAGNOSIS — R400 Somnolence: Secondary | ICD-10-CM | POA: Diagnosis not present

## 2016-12-10 DIAGNOSIS — R519 Headache, unspecified: Secondary | ICD-10-CM

## 2016-12-10 NOTE — Progress Notes (Signed)
Subjective:    Patient ID: Emily Mathis is a 51 y.o. female.  HPI     Star Age, MD, PhD [facility] [address], [address] [place], [place] [address]  Dear Domingo Mend,   I saw your patient, Emily Mathis, upon your kind request in my neurologic clinic today for initial consultation of her sleep disorder, in particular, concern for underlying obstructive sleep apnea. The patient is unaccompanied today. As you know, Emily Mathis is a 51 year old right-handed woman with an underlying medical history of hypothyroidism, chronic pancreatitis, constipation, iron deficiency anemia, polyarthralgia, type 2 diabetes, reflux disease, and morbid obesity, who reports snoring, and excessive daytime somnolence. I reviewed your office note from 11/12/2016. She was recently hospitalized for in December 2017 from 08/19/16 to 08/22/16 for flare up of her pancreatitis. Her weight fluctuates some. She reports loud snoring per family, she has been told by her daughters that there is pauses in her breathing at times. She lives with one of her daughters, she has 3 grown daughters. She takes care of her 45-year-old grandson. Bedtime and wake time vary, she is in bed typically late, between 1 and 2 AM. Wakeup time is around 8:30 AM. She reports nocturia about 2-3 times per average night and occasional morning headaches, Epworth sleepiness score is 12 out of 24, fatigue score is 63 out of 63. She has right-sided low back pain that stirrups her sleep, she changes position because of that. She denies restless leg symptoms but reports neuropathy. Her diabetes control is better, latest A1c about a month ago was 6.1. She is a nonsmoker, does not drink alcohol, does not drink caffeine daily.   Her Past Medical History Is Significant For: Past Medical History:  Diagnosis Date  . Abnormal Pap smear of cervix 1990   Hx cryotherapy to cervix  . Abnormal uterine bleeding   . Anemia 02/20/16    Transfusion of 1 unit pRBCs  . Anxiety   . Anxiety and depression   . Arthritis   . Asthma   . 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 Chest pain    per pt due to anemia-   . Diabetes mellitus without complication ([place])   . Fibroid   . Generalized pain   . GERD (gastroesophageal reflux disease)   . Headache(784.0)   . History of blood transfusion 02/20/2016  . Hyperlipidemia   . Hypertension    per Dr. Irven Shelling note 08/30/2011  . Hypothyroidism   . Leg swelling    bilateral  . Neuromuscular disorder ([place])   . Shortness of breath dyspnea    on exertion due to anemia  . Thyroid disease    Hx 3 goiters--hx thyroidectomy--sees Dr. Forde Dandy    Her Past Surgical History Is Significant For: Past Surgical History:  Procedure Laterality Date  . CARDIAC CATHETERIZATION  2013  . CESAREAN SECTION  1995  . CESAREAN SECTION  1996  . COLONOSCOPY    . CYSTOSCOPY N/A 06/11/2016   Procedure: CYSTOSCOPY;  Surgeon: [name], MD;  Location: [facility];  Service: Gynecology;  Laterality: N/A;  . HYSTEROSCOPY W/D&C N/A 04/10/2016   Procedure: HYSTEROSCOPY WITH fractional dilatation and curretage;  Surgeon: [name], MD;  Location: [facility];  Service: Gynecology;  Laterality: N/A;  . LEFT HEART CATHETERIZATION WITH CORONARY ANGIOGRAM  07/24/2011   Procedure: LEFT HEART CATHETERIZATION WITH CORONARY ANGIOGRAM;  Surgeon: Laverda Page, MD;  Location: [facility];  Service: Cardiovascular;;  .  ROBOTIC ASSISTED TOTAL HYSTERECTOMY WITH SALPINGECTOMY Bilateral 06/11/2016   Procedure: ROBOTIC ASSISTED TOTAL HYSTERECTOMY WITH SALPINGECTOMY;  Surgeon: [name], MD;  Location: [facility];  Service: Gynecology;  Laterality: Bilateral;  . THYROIDECTOMY N/A 12/18/2012   Procedure: TOTAL THYROIDECTOMY;  Surgeon: Earnstine Regal, MD;  Location: [facility];  Service: General;  Laterality: N/A;  . UPPER GI ENDOSCOPY      Her Family History Is Significant For: Family History  Problem  Relation Age of Onset  . Heart disease Mother   . Stroke Mother   . Hypertension Mother   . Heart disease Father   . Colon cancer Father   . Hypertension Father   . Diabetes Father   . Cancer Paternal Grandmother   . Asthma Daughter   . Seizures Daughter   . Hypertension Sister   . Thyroid disease Sister   . Mental illness Daughter 58    suicide attempt 06/2013  . Asthma Sister   . Hypertension Sister   . Breast cancer Paternal Aunt   . Cancer Paternal Aunt     Her Social History Is Significant For: Social History   Social History  . Marital status: Single    Spouse name: n/a  . Number of children: 3  . Years of education: Assoc   Social History Main Topics  . Smoking status: Never Smoker  . Smokeless tobacco: Never Used  . Alcohol use 0.0 oz/week     Comment: Occasional glass of wine  . Drug use: No  . Sexual activity: Yes    Partners: Male    Birth control/ protection: Abstinence, Surgical     Comment: Hyst   Other Topics Concern  . None   Social History Narrative   Lives with the youngest of her 3 daughters.   Her oldest daughter lives in [place].   Her middle daughter lives independently, and is due with her first child 12/01/15.   Her son lives with his father, who moved out 04/2015, nearby.   Caffeine intake varies     Her Allergies Are:  Allergies  Allergen Reactions  . Dilaudid [Hydromorphone Hcl] Itching  . Hydrocodone Itching  . Latex Itching and Other (See Comments)    leaves marks on skin  :   Her Current Medications Are:  Outpatient Encounter Prescriptions as of 12/10/2016  Medication Sig  . albuterol (PROVENTIL HFA;VENTOLIN HFA) 108 (90 Base) MCG/ACT inhaler Inhale 2 puffs into the lungs daily as needed. Reported on 01/04/2016  . ALPRAZolam (XANAX) 0.5 MG tablet Take 1 tablet (0.5 mg total) by mouth at bedtime as needed. for sleep  . atorvastatin (LIPITOR) 20 MG tablet Take 1 tablet (20 mg total) by mouth daily.  . Canagliflozin-Metformin  HCl (INVOKAMET) 150-500 MG TABS Take 1 tablet by mouth daily.  . celecoxib (CELEBREX) 200 MG capsule Take 1 capsule (200 mg total) by mouth 2 (two) times daily.  . chlorthalidone (HYGROTON) 25 MG tablet TAKE 1 TABLET(25 MG) BY MOUTH DAILY  . Cholecalciferol (VITAMIN D3) 50000 units CAPS Take 50,000 Units by mouth every Monday.   . cyclobenzaprine (FLEXERIL) 10 MG tablet Take 1 tablet (10 mg total) by mouth 3 (three) times daily as needed for muscle spasms.  . diazepam (VALIUM) 5 MG tablet Take 1 tablet (5 mg total) by mouth daily.  Marland Kitchen docusate sodium (COLACE) 250 MG capsule Take 1 capsule (250 mg total) by mouth daily.  . ferrous sulfate 325 (65 FE) MG tablet Take 325 mg by mouth  3 (three) times daily with meals.  Marland Kitchen glycerin adult 2 g suppository Place 1 suppository rectally as needed for constipation.  Marland Kitchen levothyroxine (SYNTHROID, LEVOTHROID) 300 MCG tablet Take 1 tablet (300 mcg total) by mouth daily.  Marland Kitchen lisinopril (PRINIVIL,ZESTRIL) 5 MG tablet Take 1 tablet (5 mg total) by mouth daily.  Marland Kitchen nystatin (MYCOSTATIN) powder Apply topically 4 (four) times daily. (Patient taking differently: Apply topically 4 (four) times daily as needed (For heat rash.). )  . ondansetron (ZOFRAN ODT) 4 MG disintegrating tablet Take 1 tablet (4 mg total) by mouth every 8 (eight) hours as needed for nausea or vomiting.  . polyethylene glycol (MIRALAX / GLYCOLAX) packet Take 17 g by mouth 2 (two) times daily. Take 2 packets today (day one), wait 2 hours, if no stool produced then take 2 more packets on day one; then continue taking 2 packets daily until you achieve daily soft stools; if water stool develops then cut back to 1 packet daily.  . potassium chloride SA (K-DUR,KLOR-CON) 20 MEQ tablet Take 1 tablet (20 mEq total) by mouth 2 (two) times daily.  Marland Kitchen senna (SENOKOT) 8.6 MG TABS tablet Take 1 tablet (8.6 mg total) by mouth daily as needed for mild constipation.  . sertraline (ZOLOFT) 100 MG tablet TAKE 1 TABLET BY MOUTH  EVERY DAY  . sucralfate (CARAFATE) 1 G tablet Take 1 tablet (1 g total) by mouth 4 (four) times daily.  . [DISCONTINUED] valACYclovir (VALTREX) 1000 MG tablet Take 1 tablet (1,000 mg total) by mouth 3 (three) times daily.   No facility-administered encounter medications on file as of 12/10/2016.   :  Review of Systems:  Out of a complete 14 point review of systems, all are reviewed and negative with the exception of these symptoms as listed below: Review of Systems  Neurological:       Patient has trouble staying asleep, snores, possible witnessed apnea while in the hospital, wakes up feeling tired, morning headaches, daytime fatigue, takes naps.    Epworth Sleepiness Scale 0= would never doze 1= slight chance of dozing 2= moderate chance of dozing 3= high chance of dozing  Sitting and reading:3 Watching TV:3 Sitting inactive in a public place (ex. Theater or meeting):0 As a passenger in a car for an hour without a break:3 Lying down to rest in the afternoon:3 Sitting and talking to someone:0 Sitting quietly after lunch (no alcohol):0 In a car, while stopped in traffic:0 Total:12  Objective:  Neurologic Exam  Physical Exam Physical Examination:   Vitals:   12/10/16 1057  BP: 130/88  Pulse: 82  Resp: 16    General Examination: The patient is a very pleasant 51 y.o. female in no acute distress. She appears well-developed and well-nourished and well groomed.   HEENT: Normocephalic, atraumatic, pupils are equal, round and reactive to light and accommodation. Funduscopic exam is normal with sharp disc margins noted. Extraocular tracking is good without limitation to gaze excursion or nystagmus noted. Normal smooth pursuit is noted. Hearing is grossly intact. Tympanic membranes are clear bilaterally. Face is symmetric with normal facial animation and normal facial sensation. Speech is clear with no dysarthria noted. There is no hypophonia. There is no lip, neck/head, jaw or voice  tremor. Neck is supple with full range of passive and active motion. There are no carotid bruits on auscultation. Oropharynx exam reveals: mild mouth dryness, marginal dental hygiene and moderate airway crowding, due to larger uvula, thicker tongue, tonsils in place, 1-2+ in size. Mallampati is class  II. Tongue protrudes centrally and palate elevates symmetrically. Neck size is 17.5 inches. She has a Mild overbite. Nasal inspection reveals no significant nasal mucosal bogginess or redness and no septal deviation.   Chest: Clear to auscultation without wheezing, rhonchi or crackles noted.  Heart: S1+S2+0, regular and normal without murmurs, rubs or gallops noted.   Abdomen: Soft, non-tender and non-distended with normal bowel sounds appreciated on auscultation.  Extremities: There is no pitting edema in the distal lower extremities bilaterally. Pedal pulses are intact.  Skin: Warm and dry without trophic changes noted.  Musculoskeletal: exam reveals no obvious joint deformities, tenderness or joint swelling or erythema.   Neurologically:  Mental status: The patient is awake, alert and oriented in all 4 spheres. Her immediate and remote memory, attention, language skills and fund of knowledge are appropriate. There is no evidence of aphasia, agnosia, apraxia or anomia. Speech is clear with normal prosody and enunciation. Thought process is linear. Mood is normal and affect is normal.  Cranial nerves II - XII are as described above under HEENT exam. In addition: shoulder shrug is normal with equal shoulder height noted. Motor exam: Normal bulk, strength and tone is noted. There is no drift, tremor or rebound. Romberg is negative. Reflexes are 1+ throughout. Fine motor skills and coordination: intact with normal finger taps, normal hand movements, normal rapid alternating patting, normal foot taps and normal foot agility.  Cerebellar testing: No dysmetria or intention tremor on finger to nose testing.  Heel to shin is unremarkable bilaterally. There is no truncal or gait ataxia.  Sensory exam: intact to light touch, pinprick, vibration, temperature sense in the upper and lower extremities, with the exception of mild decrease in temperature sense and slight decrease in pinprick sensation in the distal lower extremities bilaterally.  Gait, station and balance: She stands easily. No veering to one side is noted. No leaning to one side is noted. Posture is age-appropriate and stance is narrow based. Gait shows normal stride length and normal pace. No problems turning are noted. Tandem walk is slightly challenging.               Assessment and Plan:  In summary, Keiosha Cancro is a very pleasant 51 y.o.-year old female with an underlying medical history of hypothyroidism, chronic pancreatitis, constipation, iron deficiency anemia, polyarthralgia, type 2 diabetes, reflux disease, and morbid obesity, whose history and physical exam are concerning for obstructive sleep apnea (OSA). I had a long chat with the patient about my findings and the diagnosis of OSA, its prognosis and treatment options. We talked about medical treatments, surgical interventions and non-pharmacological approaches. I explained in particular the risks and ramifications of untreated moderate to severe OSA, especially with respect to developing cardiovascular disease down the Road, including congestive heart failure, difficult to treat hypertension, cardiac arrhythmias, or stroke. Even type 2 diabetes has, in part, been linked to untreated OSA. Symptoms of untreated OSA include daytime sleepiness, memory problems, mood irritability and mood disorder such as depression and anxiety, lack of energy, as well as recurrent headaches, especially morning headaches. We talked about trying to maintain a healthy lifestyle in general, as well as the importance of weight control. I encouraged the patient to eat healthy, exercise daily and keep well hydrated,  to keep a scheduled bedtime and wake time routine, to not skip any meals and eat healthy snacks in between meals. I advised the patient not to drive when feeling sleepy. I recommended the following at this time: sleep study  with potential positive airway pressure titration. (We will score hypopneas at 3%).   I explained the sleep test procedure to the patient and also outlined possible surgical and non-surgical treatment options of OSA, including the use of a custom-made dental device (which would require a referral to a specialist dentist or oral surgeon), upper airway surgical options, such as pillar implants, radiofrequency surgery, tongue base surgery, and UPPP (which would involve a referral to an ENT surgeon). Rarely, jaw surgery such as mandibular advancement may be considered.  I also explained the CPAP treatment option to the patient, who indicated that she would be willing to try CPAP if the need arises. I explained the importance of being compliant with PAP treatment, not only for insurance purposes but primarily to improve Her symptoms, and for the patient's long term health benefit, including to reduce Her cardiovascular risks. I answered all her questions today and the patient was in agreement. I would like to see her back after the sleep study is completed and encouraged her to call with any interim questions, concerns, problems or updates.   Thank you very much for allowing me to participate in the care of this nice patient. If I can be of any further assistance to you please do not hesitate to call me at [phone].  Sincerely,   Star Age, MD, PhD

## 2016-12-10 NOTE — Patient Instructions (Signed)

## 2016-12-12 ENCOUNTER — Telehealth: Payer: Self-pay | Admitting: Neurology

## 2016-12-12 DIAGNOSIS — R519 Headache, unspecified: Secondary | ICD-10-CM

## 2016-12-12 DIAGNOSIS — R51 Headache: Secondary | ICD-10-CM

## 2016-12-12 DIAGNOSIS — R0681 Apnea, not elsewhere classified: Secondary | ICD-10-CM

## 2016-12-12 DIAGNOSIS — R400 Somnolence: Secondary | ICD-10-CM

## 2016-12-12 DIAGNOSIS — R351 Nocturia: Secondary | ICD-10-CM

## 2016-12-12 DIAGNOSIS — R0683 Snoring: Secondary | ICD-10-CM

## 2016-12-12 NOTE — Telephone Encounter (Signed)
[employer] denied [place].  Can I get an order for HST?

## 2016-12-13 DIAGNOSIS — R748 Abnormal levels of other serum enzymes: Secondary | ICD-10-CM | POA: Diagnosis not present

## 2016-12-13 DIAGNOSIS — M2550 Pain in unspecified joint: Secondary | ICD-10-CM | POA: Diagnosis not present

## 2016-12-13 DIAGNOSIS — R202 Paresthesia of skin: Secondary | ICD-10-CM | POA: Diagnosis not present

## 2016-12-13 DIAGNOSIS — M791 Myalgia: Secondary | ICD-10-CM | POA: Diagnosis not present

## 2016-12-13 NOTE — Telephone Encounter (Signed)
Order has been placed.

## 2016-12-26 ENCOUNTER — Ambulatory Visit (INDEPENDENT_AMBULATORY_CARE_PROVIDER_SITE_OTHER): Payer: BLUE CROSS/BLUE SHIELD | Admitting: Neurology

## 2016-12-26 DIAGNOSIS — G4733 Obstructive sleep apnea (adult) (pediatric): Secondary | ICD-10-CM | POA: Diagnosis not present

## 2016-12-26 DIAGNOSIS — G4734 Idiopathic sleep related nonobstructive alveolar hypoventilation: Secondary | ICD-10-CM

## 2016-12-28 NOTE — Progress Notes (Signed)
Patient referred by [name], seen by me on 12/10/16, HST on 12/27/16:  Please call and notify the patient that the recent home sleep test did suggest the diagnosis of moderate obstructive sleep apnea and that I recommend treatment for this in the form of CPAP. I will request an overnight sleep study for proper titration and mask fitting. Please explain to patient and arrange for a CPAP titration study. I have placed an order in the chart. Thanks, and please route to [facility] for scheduling.   Star Age, MD, PhD [facility])

## 2016-12-28 NOTE — Addendum Note (Signed)
Addended by: Star Age on: 12/28/2016 11:14 AM   Modules accepted: Orders

## 2016-12-28 NOTE — Procedures (Signed)
  [facility] [address], [place], [place] [address]  NAME: Emily Mathis DOB: [birth year] MEDICAL RECORD [id number]  DOS: 12/26/16 REFERRING PHYSICIAN: Dione Housekeeper, PA-C  Study Performed:  HST/Out of Center Sleep Test  HISTORY: 51 year old woman with a history of hypothyroidism, chronic pancreatitis, constipation, iron deficiency anemia, polyarthralgia, type 2 diabetes, reflux disease, and morbid obesity, who reports snoring, and excessive daytime somnolence. I reviewed your office note from 11/12/2016. Epworth sleepiness score is 12 out of 24, fatigue score is 63 out of 63. BMI of 47.4.  STUDY RESULTS:  Total Recording Time: 8h 22 min.  Total Apnea/Hypopnea Index (AHI):  21.8/hour  Average Oxygen Saturation: 92%  Lowest Oxygen Saturation: 58%  Time below 88% saturation: 46 min  Average Mean Heart Rate: 96 bpm  IMPRESSION: Obstructive Sleep Apnea (OSA) ; Nocturnal hypoxemia  RECOMMENDATION:  This home sleep test demonstrates overall moderate obstructive sleep apnea and significant desaturations, with a total AHI of 21.8/hour and O2 nadir of 58% and time below 88% saturation of 46 minutes. Given the patient's medical history, degree of OSA and desaturations, and her sleep related complaints, treatment with positive airway pressure (in the form of CPAP) is recommended. This will require a full night CPAP titration study for proper treatment settings and mask fitting. The patient and his referring provider will be notified of the test results.  Please note that untreated obstructive sleep apnea carries additional perioperative morbidity. Patients with significant obstructive sleep apnea should receive perioperative PAP therapy and the surgeons and particularly the anesthesiologist should be informed of the diagnosis and the severity of the sleep disordered breathing. The patient will be seen in follow up in sleep clinic at [facility].     I  certify that I have reviewed the raw data recording prior to the issuance of this report in accordance with the standards of Accreditation of the American Academy of Sleep medicine (AASM).   Star Age, MD, PhD [facility]) Diplomat, ABPN (neurology and sleep)

## 2017-01-03 ENCOUNTER — Telehealth: Payer: Self-pay | Admitting: Neurology

## 2017-01-03 ENCOUNTER — Encounter (INDEPENDENT_AMBULATORY_CARE_PROVIDER_SITE_OTHER): Payer: BLUE CROSS/BLUE SHIELD | Admitting: Family Medicine

## 2017-01-03 NOTE — Telephone Encounter (Signed)
I called pt. I advised her that her sleep study results suggested that have moderate osa and Dr. Rexene Alberts recommends that pt receive treatment in the form of a cpap. Dr. Rexene Alberts has ordered an overnight study for the proper titration and mask fitting. Pt is agreeable to this. Pt verbalized understanding of results. Pt had no questions at this time but was encouraged to call back if questions arise.

## 2017-01-03 NOTE — Telephone Encounter (Signed)
-----   Message from Star Age, MD sent at 12/28/2016 11:14 AM EDT ----- Patient referred by [name], seen by me on 12/10/16, HST on 12/27/16:  Please call and notify the patient that the recent home sleep test did suggest the diagnosis of moderate obstructive sleep apnea and that I recommend treatment for this in the form of CPAP. I will request an overnight sleep study for proper titration and mask fitting. Please explain to patient and arrange for a CPAP titration study. I have placed an order in the chart. Thanks, and please route to [facility] for scheduling.   Star Age, MD, PhD [facility])

## 2017-01-03 NOTE — Telephone Encounter (Signed)
Pt request home sleep study results

## 2017-01-09 ENCOUNTER — Telehealth: Payer: Self-pay | Admitting: Neurology

## 2017-01-09 DIAGNOSIS — G4734 Idiopathic sleep related nonobstructive alveolar hypoventilation: Secondary | ICD-10-CM

## 2017-01-09 DIAGNOSIS — G4733 Obstructive sleep apnea (adult) (pediatric): Secondary | ICD-10-CM

## 2017-01-09 NOTE — Telephone Encounter (Signed)
Insurance will not authorize until they fail auto cpap.

## 2017-01-09 NOTE — Telephone Encounter (Signed)
[employer] denied CPAP suggested autopap

## 2017-01-09 NOTE — Telephone Encounter (Signed)
We will set patient up with autoPAP at home, as insurance denied in house titration study for OSA. Pls process order and notify patient and set up FU in 10 weeks.       

## 2017-01-09 NOTE — Telephone Encounter (Signed)
Robin: can you look into this again for CPAP attended titration authorization?  O2 nadir was 58% on HST and time below 88% sats nearly one hour!

## 2017-01-14 NOTE — Telephone Encounter (Signed)
I spoke to patient and she is aware of information below. She is willing to proceed with treatment. I will send orders to [facility]. Patient will receive a letter reminding her to make f/u appt and stress the importance of compliance.

## 2017-01-24 DIAGNOSIS — G4733 Obstructive sleep apnea (adult) (pediatric): Secondary | ICD-10-CM | POA: Diagnosis not present

## 2017-02-05 ENCOUNTER — Ambulatory Visit (INDEPENDENT_AMBULATORY_CARE_PROVIDER_SITE_OTHER): Payer: BLUE CROSS/BLUE SHIELD | Admitting: Physician Assistant

## 2017-02-05 ENCOUNTER — Encounter: Payer: Self-pay | Admitting: Physician Assistant

## 2017-02-05 VITALS — BP 120/80 | HR 109 | Temp 97.9°F | Resp 20 | Ht 63.25 in | Wt 258.4 lb

## 2017-02-05 DIAGNOSIS — R110 Nausea: Secondary | ICD-10-CM

## 2017-02-05 DIAGNOSIS — M791 Myalgia, unspecified site: Secondary | ICD-10-CM

## 2017-02-05 DIAGNOSIS — R109 Unspecified abdominal pain: Secondary | ICD-10-CM

## 2017-02-05 DIAGNOSIS — R42 Dizziness and giddiness: Secondary | ICD-10-CM | POA: Diagnosis not present

## 2017-02-05 LAB — POCT CBC
GRANULOCYTE PERCENT: 58.4 % (ref 37–80)
HCT, POC: 37.5 % — AB (ref 37.7–47.9)
Hemoglobin: 12.6 g/dL (ref 12.2–16.2)
Lymph, poc: 4 — AB (ref 0.6–3.4)
MCH: 28.8 pg (ref 27–31.2)
MCHC: 33.5 g/dL (ref 31.8–35.4)
MCV: 85.9 fL (ref 80–97)
MID (CBC): 0.8 (ref 0–0.9)
MPV: 7.7 fL (ref 0–99.8)
POC Granulocyte: 6.7 (ref 2–6.9)
POC LYMPH %: 34.8 % (ref 10–50)
POC MID %: 6.8 % (ref 0–12)
Platelet Count, POC: 508 10*3/uL — AB (ref 142–424)
RBC: 4.36 M/uL (ref 4.04–5.48)
RDW, POC: 16 %
WBC: 11.4 10*3/uL — AB (ref 4.6–10.2)

## 2017-02-05 LAB — POCT URINALYSIS DIP (MANUAL ENTRY)
BILIRUBIN UA: NEGATIVE
Ketones, POC UA: NEGATIVE mg/dL
Leukocytes, UA: NEGATIVE
NITRITE UA: NEGATIVE
PH UA: 5 (ref 5.0–8.0)
Protein Ur, POC: NEGATIVE mg/dL
RBC UA: NEGATIVE
SPEC GRAV UA: 1.015 (ref 1.010–1.025)
Urobilinogen, UA: 0.2 E.U./dL

## 2017-02-05 LAB — GLUCOSE, POCT (MANUAL RESULT ENTRY): POC GLUCOSE: 103 mg/dL — AB (ref 70–99)

## 2017-02-05 MED ORDER — RANITIDINE HCL 150 MG PO TABS: 150.0000 mg | ORAL_TABLET | Freq: Two times a day (BID) | ORAL | 3 refills | Status: DC

## 2017-02-05 NOTE — Patient Instructions (Addendum)
Goal for blood sugars: FASTING (nothing to eat or drink x 8-12 hours): <130 Post-Prandial (2-3 hours after your meal): <180    IF you received an x-ray today, you will receive an invoice from [facility]. Please contact [facility] at [phone] with questions or concerns regarding your invoice.   IF you received labwork today, you will receive an invoice from [facility]. Please contact [facility] at [phone] with questions or concerns regarding your invoice.   Our billing staff will not be able to assist you with questions regarding bills from these companies.  You will be contacted with the lab results as soon as they are available. The fastest way to get your results is to activate your My Chart account. Instructions are located on the last page of this paperwork. If you have not heard from [place] regarding the results in 2 weeks, please contact this office.

## 2017-02-05 NOTE — Progress Notes (Signed)
Patient ID: Emily Mathis, female    DOB: 10-18-1965, 51 y.o.   MRN: [id number]  PCP: Porfirio Oar, PA-C  Chief Complaint  Patient presents with  . Dizziness    symptoms x 2wks  . Nausea    Subjective:   Presents for evaluation of dizziness x 2 weeks. She is accompanied today by two toddlers, her grandson and his half-sister (their father is the patient's son).  Started with recurrent chest pains. Sharp pains. Feels like she can't breathe. Then developed dizziness and nausea. No vomiting. Now with upper abdominal pain and bloating. Uses that little blue inhaler. I mean the red (Proair). Ineffective when she uses it. Cant afford the maintenance product.  Things spinning. She feels like she may fall.  Sees rheumatology tomorrow. At her last visit there there were some concerns raised by the lab results, "Something to do with the numbers. I can't remember what she said." Last visit with Dr. Evlyn Kanner was in March 15. No changes in her regimen were made. Next visit is next month. Saw Dr. Frances Furbish for sleep evaluation. Sleep study revealed moderate OSA. Has started CPAP. Follows up 04/04/2017.  Nor urinary urgency, frequency, burning. Constipation. No diarrhea. Ran out of test strips last week, but prior to that, "They were up and down." 111, 117, 88.   Review of Systems As above.    Patient Active Problem List   Diagnosis Date Noted  . Hypothyroidism   . Abdominal pain   . Chronic pancreatitis (HCC)   . Constipation   . Pancreatitis 08/19/2016  . Status post laparoscopic hysterectomy 06/11/2016  . Iron deficiency anemia 02/15/2016  . Myalgia 02/15/2016  . Upper airway cough syndrome 10/21/2015  . Polyarthralgia 06/16/2015  . Diabetes mellitus type 2, uncomplicated (HCC) 09/14/2013  . Keloid scar, cervical incision 09/02/2013  . Reaction, situational, acute, to stress 07/02/2013  . 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 BMI 45.0-49.9, adult (HCC) 03/27/2013    . GERD (gastroesophageal reflux disease) 02/15/2013  . Hypocalcemia 01/07/2013  . Chest pain on exertion 07/24/2011     Prior to Admission medications   Medication Sig Start Date End Date Taking? Authorizing Provider  albuterol (PROVENTIL HFA;VENTOLIN HFA) 108 (90 Base) MCG/ACT inhaler Inhale 2 puffs into the lungs daily as needed. Reported on 01/04/2016 03/28/16  Yes Nykayla Marcelli, PA-C  ALPRAZolam (XANAX) 0.5 MG tablet Take 1 tablet (0.5 mg total) by mouth at bedtime as needed. for sleep 07/03/16  Yes Geisha Abernathy, PA-C  atorvastatin (LIPITOR) 20 MG tablet Take 1 tablet (20 mg total) by mouth daily. 11/13/16  Yes Jedediah Noda, PA-C  Canagliflozin-Metformin HCl (INVOKAMET) 150-500 MG TABS Take 1 tablet by mouth daily. 04/25/16  Yes Bryona Foxworthy, PA-C  chlorthalidone (HYGROTON) 25 MG tablet TAKE 1 TABLET(25 MG) BY MOUTH DAILY 09/05/16  Yes Porfirio Oar, PA-C  Cholecalciferol (VITAMIN D3) 50000 units CAPS Take 50,000 Units by mouth every Monday.  06/14/16  Yes [provider]  cyclobenzaprine (FLEXERIL) 10 MG tablet Take 1 tablet (10 mg total) by mouth 3 (three) times daily as needed for muscle spasms. 11/12/16  Yes Broden Holt, PA-C  diazepam (VALIUM) 5 MG tablet Take 1 tablet (5 mg total) by mouth daily. 07/03/16  Yes Tanner Yeley, PA-C  docusate sodium (COLACE) 250 MG capsule Take 1 capsule (250 mg total) by mouth daily. 09/06/16  Yes Yeimi Debnam, PA-C  ferrous sulfate 325 (65 FE) MG tablet Take 325 mg by mouth 3 (three) times daily with meals.   Yes  [provider]  glycerin adult 2 g suppository Place 1 suppository rectally as needed for constipation.   Yes [provider]  levothyroxine (SYNTHROID, LEVOTHROID) 300 MCG tablet Take 1 tablet (300 mcg total) by mouth daily. 06/17/15  Yes Tarry Blayney, PA-C  lisinopril (PRINIVIL,ZESTRIL) 5 MG tablet Take 1 tablet (5 mg total) by mouth daily. 11/12/16  Yes Marizol Borror, PA-C  nystatin  (MYCOSTATIN) powder Apply topically 4 (four) times daily. Patient taking differently: Apply topically 4 (four) times daily as needed (For heat rash.).  10/02/15  Yes Nil Bolser, PA-C  polyethylene glycol (MIRALAX / GLYCOLAX) packet Take 17 g by mouth 2 (two) times daily. Take 2 packets today (day one), wait 2 hours, if no stool produced then take 2 more packets on day one; then continue taking 2 packets daily until you achieve daily soft stools; if water stool develops then cut back to 1 packet daily. 07/24/16  Yes Street, [place], PA-C  potassium chloride SA (K-DUR,KLOR-CON) 20 MEQ tablet Take 1 tablet (20 mEq total) by mouth 2 (two) times daily. 06/12/16  Yes Patton Salles, MD  senna (SENOKOT) 8.6 MG TABS tablet Take 1 tablet (8.6 mg total) by mouth daily as needed for mild constipation. 08/22/16  Yes Mikell, Antionette Poles, MD  sertraline (ZOLOFT) 100 MG tablet TAKE 1 TABLET BY MOUTH EVERY DAY 08/18/16  Yes Shade Flood, MD  sucralfate (CARAFATE) 1 G tablet Take 1 tablet (1 g total) by mouth 4 (four) times daily. 04/19/15  Yes Marisa Severin, MD  celecoxib (CELEBREX) 200 MG capsule Take 1 capsule (200 mg total) by mouth 2 (two) times daily. Patient not taking: Reported on 02/05/2017 09/06/16   Porfirio Oar, PA-C  ondansetron (ZOFRAN ODT) 4 MG disintegrating tablet Take 1 tablet (4 mg total) by mouth every 8 (eight) hours as needed for nausea or vomiting. Patient not taking: Reported on 02/05/2017 07/24/16   Street, [place], [place]     Allergies  Allergen Reactions  . Dilaudid [Hydromorphone Hcl] Itching  . Hydrocodone Itching  . Latex Itching and Other (See Comments)    leaves marks on skin       Objective:  Physical Exam  Constitutional: She is oriented to person, place, and time. She appears well-developed and well-nourished. She is active and cooperative. No distress.  BP 120/80   Pulse (!) 111   Temp 97.9 F (36.6 C) (Oral)   Resp 20   Ht 5' 3.25" (1.607 m)   Wt 258  lb 6.4 oz (117.2 kg)   LMP 05/08/2016 (Exact Date)   SpO2 98%   BMI 45.41 kg/m   HENT:  Head: Normocephalic and atraumatic.  Right Ear: Hearing normal.  Left Ear: Hearing normal.  Eyes: Conjunctivae are normal. No scleral icterus.  Neck: Normal range of motion. Neck supple. No thyromegaly present.  Cardiovascular: Normal rate, regular rhythm and normal heart sounds.   Pulses:      Radial pulses are 2+ on the right side, and 2+ on the left side.  Pulmonary/Chest: Effort normal and breath sounds normal.  Lymphadenopathy:       Head (right side): No tonsillar, no preauricular, no posterior auricular and no occipital adenopathy present.       Head (left side): No tonsillar, no preauricular, no posterior auricular and no occipital adenopathy present.    She has no cervical adenopathy.       Right: No supraclavicular adenopathy present.       Left: No supraclavicular adenopathy  present.  Neurological: She is alert and oriented to person, place, and time. She has normal strength. No cranial nerve deficit or sensory deficit. She displays a negative Romberg sign.  Reflex Scores:      Bicep reflexes are 2+ on the right side and 2+ on the left side.      Patellar reflexes are 2+ on the right side and 2+ on the left side.      Achilles reflexes are 2+ on the right side and 2+ on the left side. Skin: Skin is warm, dry and intact. No rash noted. No cyanosis or erythema. Nails show no clubbing.  Psychiatric: She has a normal mood and affect. Her speech is normal and behavior is normal.   Orthostatic VS for the past 24 hrs:  BP- Lying Pulse- Lying BP- Sitting Pulse- Sitting BP- Standing at 0 minutes Pulse- Standing at 0 minutes  02/08/17 1341 131/83 109 120/80 111 115/81 116    EKG reviewed with Dr. Katrinka Blazing. Sinus rhythm with rate of 104. PR interval is 146. Non-specific ST depression is not changed from previous tracings 08/19/2016 and 04/10/2016.   Results for orders placed or performed in visit  on 02/05/17  POCT glucose (manual entry)  Result Value Ref Range   POC Glucose 103 (A) 70 - 99 mg/dl  POCT CBC  Result Value Ref Range   WBC 11.4 (A) 4.6 - 10.2 K/uL   Lymph, poc 4.0 (A) 0.6 - 3.4   POC LYMPH PERCENT 34.8 10 - 50 %L   MID (cbc) 0.8 0 - 0.9   POC MID % 6.8 0 - 12 %M   POC Granulocyte 6.7 2 - 6.9   Granulocyte percent 58.4 37 - 80 %G   RBC 4.36 4.04 - 5.48 M/uL   Hemoglobin 12.6 12.2 - 16.2 g/dL   HCT, POC 04.5 (A) 40.9 - 47.9 %   MCV 85.9 80 - 97 fL   MCH, POC 28.8 27 - 31.2 pg   MCHC 33.5 31.8 - 35.4 g/dL   RDW, POC 81.1 %   Platelet Count, POC 508 (A) 142 - 424 K/uL   MPV 7.7 0 - 99.8 fL  POCT urinalysis dipstick  Result Value Ref Range   Color, UA yellow yellow   Clarity, UA clear clear   Glucose, UA >=1,000 (A) negative mg/dL   Bilirubin, UA negative negative   Ketones, POC UA negative negative mg/dL   Spec Grav, UA 9.147 8.295 - 1.025   Blood, UA negative negative   pH, UA 5.0 5.0 - 8.0   Protein Ur, POC negative negative mg/dL   Urobilinogen, UA 0.2 0.2 or 1.0 E.U./dL   Nitrite, UA Negative Negative   Leukocytes, UA Negative Negative       Assessment & Plan:   Problem List Items Addressed This Visit    Myalgia    Follow-up with rheumatology tomorrow as planned.      Abdominal pain    Suspect this is GERD. Start ranitidine. Use ondansetron PRN.      Relevant Medications   ranitidine (ZANTAC) 150 MG tablet    Other Visit Diagnoses    Dizziness    -  Primary   Unclear etiology. EKG and labs today are reassuring. Suspect this is due to GERD. If persists, RTC.    Relevant Orders   POCT glucose (manual entry) (Completed)   POCT CBC (Completed)   POCT urinalysis dipstick (Completed)   EKG 12-Lead (Completed)   Nausea without vomiting  Unclear etiology. EKG and labs today are reassuring. Suspect this is due to GERD. If persists, RTC.        Return if symptoms worsen or fail to improve.   Fernande Bras, PA-C Primary Care at  [facility]

## 2017-02-06 DIAGNOSIS — R202 Paresthesia of skin: Secondary | ICD-10-CM | POA: Diagnosis not present

## 2017-02-06 DIAGNOSIS — R748 Abnormal levels of other serum enzymes: Secondary | ICD-10-CM | POA: Diagnosis not present

## 2017-02-06 DIAGNOSIS — M7062 Trochanteric bursitis, left hip: Secondary | ICD-10-CM | POA: Diagnosis not present

## 2017-02-06 DIAGNOSIS — M2550 Pain in unspecified joint: Secondary | ICD-10-CM | POA: Diagnosis not present

## 2017-02-06 DIAGNOSIS — M791 Myalgia: Secondary | ICD-10-CM | POA: Diagnosis not present

## 2017-02-08 ENCOUNTER — Encounter: Payer: Self-pay | Admitting: Physician Assistant

## 2017-02-08 NOTE — Assessment & Plan Note (Signed)
Follow-up with rheumatology tomorrow as planned.

## 2017-02-08 NOTE — Assessment & Plan Note (Signed)
Suspect this is GERD. Start ranitidine. Use ondansetron PRN.

## 2017-02-21 ENCOUNTER — Ambulatory Visit (INDEPENDENT_AMBULATORY_CARE_PROVIDER_SITE_OTHER): Payer: BLUE CROSS/BLUE SHIELD | Admitting: Obstetrics and Gynecology

## 2017-02-21 ENCOUNTER — Encounter: Payer: Self-pay | Admitting: Obstetrics and Gynecology

## 2017-02-21 VITALS — BP 122/70 | HR 88 | Resp 16 | Ht 63.0 in | Wt 261.0 lb

## 2017-02-21 DIAGNOSIS — Z01419 Encounter for gynecological examination (general) (routine) without abnormal findings: Secondary | ICD-10-CM

## 2017-02-21 NOTE — Patient Instructions (Signed)

## 2017-02-21 NOTE — Progress Notes (Signed)
51 y.o. G30P3000 Single African American female here for annual exam.    Dealing with thyroid imbalance.  Taking Synthroid.   Using CPAP since last month.   Diagnosed with chronic pancreatitis last year.  Dealing with constipation.  Using Miralax. Has abdominal pain prior to BMs. No blood in the stool or black stools.   Decreased interest in sexual activity.  No pain or bleeding with intercourse. Relationship strain.  Having muscle and joint pain.  Has long standing lower extremity swelling.  Received IM steroid injection.   Hot flashes.  Manageable.   Labs with PCP.   PCP:   Quay Burow, PA  Endocrinologist:  Reynold Bowen, MD  Patient's last menstrual period was 05/08/2016 (exact date).           Sexually active: No.  The current method of family planning is status post hysterectomy.    Exercising: No.  The patient does not participate in regular exercise at present. Smoker:  no  Health Maintenance: Pap:  03/12/16 Neg, Neg HR HPV History of abnormal Pap:  Yes -- years ago MMG:  02/29/16 Density B/Neg/BIRADS1: [facility] -- patient will scheduled next MMG Colonoscopy:  01/04/16 Normal.   BMD:   n/a  Result  n/a TDaP:  09/14/13 Gardasil:   n/a HIV: per patient done in the past and was negative Hep C: per patient done in the past and was negative Screening Labs:  Hb today: PCP takes care of labs   reports that she has never smoked. She has never used smokeless tobacco. She reports that she drinks alcohol. She reports that she does not use drugs.  Past Medical History:  Diagnosis Date  . Abnormal Pap smear of cervix 1990   Hx cryotherapy to cervix  . Abnormal uterine bleeding   . Anemia 02/20/16   Transfusion of 1 unit pRBCs  . Anxiety   . Anxiety and depression   . Arthritis   . Asthma   . 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 Chest pain    per pt due to anemia-   . Diabetes mellitus without complication ([place])   . Fibroid   . Generalized pain    . GERD (gastroesophageal reflux disease)   . Headache(784.0)   . History of blood transfusion 02/20/2016  . Hyperlipidemia   . Hypertension    per Dr. Irven Shelling note 08/30/2011  . Hypothyroidism   . Leg swelling    bilateral  . Neuromuscular disorder ([place])   . Shortness of breath dyspnea    on exertion due to anemia  . Thyroid disease    Hx 3 goiters--hx thyroidectomy--sees Dr. Forde Dandy    Past Surgical History:  Procedure Laterality Date  . CARDIAC CATHETERIZATION  2013  . CESAREAN SECTION  1995  . CESAREAN SECTION  1996  . COLONOSCOPY    . CYSTOSCOPY N/A 06/11/2016   Procedure: CYSTOSCOPY;  Surgeon: [name], MD;  Location: [facility];  Service: Gynecology;  Laterality: N/A;  . HYSTEROSCOPY W/D&C N/A 04/10/2016   Procedure: HYSTEROSCOPY WITH fractional dilatation and curretage;  Surgeon: [name], MD;  Location: [facility];  Service: Gynecology;  Laterality: N/A;  . LEFT HEART CATHETERIZATION WITH CORONARY ANGIOGRAM  07/24/2011   Procedure: LEFT HEART CATHETERIZATION WITH CORONARY ANGIOGRAM;  Surgeon: Laverda Page, MD;  Location: [facility] CATH LAB;  Service: Cardiovascular;;  . ROBOTIC ASSISTED TOTAL HYSTERECTOMY WITH SALPINGECTOMY Bilateral 06/11/2016   Procedure: ROBOTIC ASSISTED TOTAL HYSTERECTOMY WITH SALPINGECTOMY;  Surgeon: [name], MD;  Location: [facility];  Service: Gynecology;  Laterality: Bilateral;  . THYROIDECTOMY N/A 12/18/2012   Procedure: TOTAL THYROIDECTOMY;  Surgeon: Earnstine Regal, MD;  Location: [facility];  Service: General;  Laterality: N/A;  . UPPER GI ENDOSCOPY      Current Outpatient Prescriptions  Medication Sig Dispense Refill  . albuterol (PROVENTIL HFA;VENTOLIN HFA) 108 (90 Base) MCG/ACT inhaler Inhale 2 puffs into the lungs daily as needed. Reported on 01/04/2016 18 g 1  . ALPRAZolam (XANAX) 0.5 MG tablet Take 1 tablet (0.5 mg total) by mouth at bedtime as needed. for sleep 30 tablet 0  . atorvastatin (LIPITOR) 20 MG tablet  Take 1 tablet (20 mg total) by mouth daily. 90 tablet 3  . Canagliflozin-Metformin HCl (INVOKAMET) 150-500 MG TABS Take 1 tablet by mouth daily. 90 tablet 0  . celecoxib (CELEBREX) 200 MG capsule Take 1 capsule (200 mg total) by mouth 2 (two) times daily. 60 capsule 1  . chlorthalidone (HYGROTON) 25 MG tablet TAKE 1 TABLET(25 MG) BY MOUTH DAILY 90 tablet 0  . Cholecalciferol (VITAMIN D3) 50000 units CAPS Take 50,000 Units by mouth every Monday.   3  . cyclobenzaprine (FLEXERIL) 10 MG tablet Take 1 tablet (10 mg total) by mouth 3 (three) times daily as needed for muscle spasms. 30 tablet 0  . diazepam (VALIUM) 5 MG tablet Take 1 tablet (5 mg total) by mouth daily. 30 tablet 0  . docusate sodium (COLACE) 250 MG capsule Take 1 capsule (250 mg total) by mouth daily. 30 capsule 0  . ferrous sulfate 325 (65 FE) MG tablet Take 325 mg by mouth 3 (three) times daily with meals.    Marland Kitchen glycerin adult 2 g suppository Place 1 suppository rectally as needed for constipation.    Marland Kitchen levothyroxine (SYNTHROID, LEVOTHROID) 300 MCG tablet Take 1 tablet (300 mcg total) by mouth daily. 90 tablet 3  . lisinopril (PRINIVIL,ZESTRIL) 5 MG tablet Take 1 tablet (5 mg total) by mouth daily. 90 tablet 3  . nystatin (MYCOSTATIN) powder Apply topically 4 (four) times daily. (Patient taking differently: Apply topically 4 (four) times daily as needed (For heat rash.). ) 60 g 2  . ondansetron (ZOFRAN ODT) 4 MG disintegrating tablet Take 1 tablet (4 mg total) by mouth every 8 (eight) hours as needed for nausea or vomiting. 15 tablet 0  . polyethylene glycol (MIRALAX / GLYCOLAX) packet Take 17 g by mouth 2 (two) times daily. Take 2 packets today (day one), wait 2 hours, if no stool produced then take 2 more packets on day one; then continue taking 2 packets daily until you achieve daily soft stools; if water stool develops then cut back to 1 packet daily. 14 each 0  . potassium chloride SA (K-DUR,KLOR-CON) 20 MEQ tablet Take 1 tablet (20  mEq total) by mouth 2 (two) times daily. 60 tablet 0  . ranitidine (ZANTAC) 150 MG tablet Take 1 tablet (150 mg total) by mouth 2 (two) times daily. 60 tablet 3  . senna (SENOKOT) 8.6 MG TABS tablet Take 1 tablet (8.6 mg total) by mouth daily as needed for mild constipation. 120 each 0  . sertraline (ZOLOFT) 100 MG tablet TAKE 1 TABLET BY MOUTH EVERY DAY 30 tablet 1  . sucralfate (CARAFATE) 1 G tablet Take 1 tablet (1 g total) by mouth 4 (four) times daily. 30 tablet 0   No current facility-administered medications for this visit.     Family History  Problem  Relation Age of Onset  . Heart disease Mother   . Stroke Mother   . Hypertension Mother   . Heart disease Father   . Colon cancer Father   . Hypertension Father   . Diabetes Father   . Cancer Paternal Grandmother   . Asthma Daughter   . Seizures Daughter   . Hypertension Sister   . Thyroid disease Sister   . Mental illness Daughter 33       suicide attempt 06/2013  . Asthma Sister   . Hypertension Sister   . Breast cancer Paternal Aunt   . Cancer Paternal Aunt     ROS:  Pertinent items are noted in HPI.  Otherwise, a comprehensive ROS was negative.  Exam:   BP 122/70 (BP Location: Right Arm, Patient Position: Sitting, Cuff Size: Large)   Pulse 88   Resp 16   Ht 5\' 3"  (1.6 m)   Wt 261 lb (118.4 kg)   LMP 05/08/2016 (Exact Date)   BMI 46.23 kg/m     General appearance: alert, cooperative and appears stated age Head: Normocephalic, without obvious abnormality, atraumatic Neck: no adenopathy, supple, symmetrical, trachea midline and thyroid normal to inspection and palpation Lungs: clear to auscultation bilaterally Breasts: normal appearance, no masses or tenderness, No nipple retraction or dimpling, No nipple discharge or bleeding, No axillary or supraclavicular adenopathy Heart: regular rate and rhythm Abdomen: scars consistent with laparoscopic surgery, soft, non-tender; no masses, no organomegaly Extremities:  extremities normal, atraumatic, no cyanosis or edema Skin: Skin color, texture, turgor normal. No rashes or lesions Lymph nodes: Cervical, supraclavicular, and axillary nodes normal. No abnormal inguinal nodes palpated Neurologic: Grossly normal  Pelvic: External genitalia:  no lesions              Urethra:  normal appearing urethra with no masses, tenderness or lesions              Bartholins and Skenes: normal                 Vagina: normal appearing vagina with normal color and discharge, no lesions              Cervix: absent.              Pap taken: No. Bimanual Exam:  Uterus:   Absent.              Adnexa: no mass, fullness, tenderness              Rectal exam: Yes.  .  Confirms.              Anus:  normal sphincter tone, no lesions  Chaperone was present for exam.  Assessment:   Well woman visit with normal exam. Status post robotic hysterectomy with bilateral salpingectomy.   Ovaries remain. FH colon cancer.   Plan: Mammogram screening discussed.  She will schedule. Recommended self breast awareness. Pap and HR HPV as above. Guidelines for Calcium, Vitamin D, regular exercise program including cardiovascular and weight bearing exercise. We discussed probiotics through yogurt consumption.  I encouraged weight loss.  Colonoscopy in 4 years versus 9 years. Labs with PCP and endocrinology. Follow up annually and prn.   After visit summary provided.

## 2017-02-24 DIAGNOSIS — G4733 Obstructive sleep apnea (adult) (pediatric): Secondary | ICD-10-CM | POA: Diagnosis not present

## 2017-03-01 ENCOUNTER — Other Ambulatory Visit: Payer: Self-pay | Admitting: Physician Assistant

## 2017-03-04 ENCOUNTER — Other Ambulatory Visit: Payer: Self-pay | Admitting: Obstetrics and Gynecology

## 2017-03-04 DIAGNOSIS — R202 Paresthesia of skin: Secondary | ICD-10-CM | POA: Diagnosis not present

## 2017-03-04 DIAGNOSIS — M7062 Trochanteric bursitis, left hip: Secondary | ICD-10-CM | POA: Diagnosis not present

## 2017-03-04 DIAGNOSIS — M2550 Pain in unspecified joint: Secondary | ICD-10-CM | POA: Diagnosis not present

## 2017-03-04 DIAGNOSIS — M791 Myalgia: Secondary | ICD-10-CM | POA: Diagnosis not present

## 2017-03-04 DIAGNOSIS — Z1231 Encounter for screening mammogram for malignant neoplasm of breast: Secondary | ICD-10-CM

## 2017-03-06 ENCOUNTER — Other Ambulatory Visit: Payer: Self-pay | Admitting: Emergency Medicine

## 2017-03-06 MED ORDER — ALPRAZOLAM 0.5 MG PO TABS
0.5000 mg | ORAL_TABLET | Freq: Every evening | ORAL | 0 refills | Status: DC | PRN
Start: 2017-03-06 — End: 2017-08-15

## 2017-03-06 MED ORDER — CANAGLIFLOZIN-METFORMIN HCL 150-500 MG PO TABS: 1.0000 | ORAL_TABLET | Freq: Every day | ORAL | 3 refills | Status: DC

## 2017-03-06 NOTE — Telephone Encounter (Addendum)
Meds ordered this encounter  Medications  . ALPRAZolam (XANAX) 0.5 MG tablet    Sig: Take 1 tablet (0.5 mg total) by mouth at bedtime as needed. for sleep    Dispense:  30 tablet    Refill:  0   Patient notified via My Chart.

## 2017-03-07 NOTE — Telephone Encounter (Signed)
Prescription faxed to [facility]

## 2017-03-08 ENCOUNTER — Ambulatory Visit: Payer: BLUE CROSS/BLUE SHIELD | Admitting: Physician Assistant

## 2017-03-11 ENCOUNTER — Ambulatory Visit: Payer: BLUE CROSS/BLUE SHIELD | Admitting: Physician Assistant

## 2017-03-11 DIAGNOSIS — M545 Low back pain: Secondary | ICD-10-CM | POA: Diagnosis not present

## 2017-03-11 DIAGNOSIS — M25552 Pain in left hip: Secondary | ICD-10-CM | POA: Diagnosis not present

## 2017-03-12 ENCOUNTER — Encounter: Payer: Self-pay | Admitting: Physician Assistant

## 2017-03-14 NOTE — Telephone Encounter (Signed)
Patient notified via My Chart.  Letter printed/signed. Please put [facility] or INVOKAMET coupon with letter (if we have any).

## 2017-03-18 ENCOUNTER — Ambulatory Visit: Payer: BLUE CROSS/BLUE SHIELD

## 2017-03-18 DIAGNOSIS — M25511 Pain in right shoulder: Secondary | ICD-10-CM | POA: Diagnosis not present

## 2017-03-18 DIAGNOSIS — M545 Low back pain: Secondary | ICD-10-CM | POA: Diagnosis not present

## 2017-03-18 DIAGNOSIS — M25552 Pain in left hip: Secondary | ICD-10-CM | POA: Diagnosis not present

## 2017-03-25 ENCOUNTER — Ambulatory Visit: Payer: BLUE CROSS/BLUE SHIELD

## 2017-04-01 DIAGNOSIS — M25552 Pain in left hip: Secondary | ICD-10-CM | POA: Diagnosis not present

## 2017-04-01 DIAGNOSIS — M545 Low back pain: Secondary | ICD-10-CM | POA: Diagnosis not present

## 2017-04-01 DIAGNOSIS — M25511 Pain in right shoulder: Secondary | ICD-10-CM | POA: Diagnosis not present

## 2017-04-04 ENCOUNTER — Ambulatory Visit (INDEPENDENT_AMBULATORY_CARE_PROVIDER_SITE_OTHER): Payer: BLUE CROSS/BLUE SHIELD | Admitting: Neurology

## 2017-04-04 ENCOUNTER — Encounter: Payer: Self-pay | Admitting: Neurology

## 2017-04-04 VITALS — BP 140/84 | HR 78 | Ht 63.0 in | Wt 267.0 lb

## 2017-04-04 DIAGNOSIS — G4733 Obstructive sleep apnea (adult) (pediatric): Secondary | ICD-10-CM

## 2017-04-04 DIAGNOSIS — G4734 Idiopathic sleep related nonobstructive alveolar hypoventilation: Secondary | ICD-10-CM

## 2017-04-04 DIAGNOSIS — Z9989 Dependence on other enabling machines and devices: Secondary | ICD-10-CM

## 2017-04-04 NOTE — Progress Notes (Signed)
Subjective:    Patient ID: Emily Mathis is a 51 y.o. female.  HPI     Interim history:   Emily Mathis is a 51 year old right-handed woman with an underlying medical history of hypothyroidism, chronic pancreatitis, constipation, iron deficiency anemia, polyarthralgia, type 2 diabetes, reflux disease, and morbid obesity, who presents for follow-up consultation of her obstructive sleep apnea, after home sleep testing and starting AutoPap therapy. The patient is unaccompanied today. I first met her on 12/10/2016 at the request of her primary care provider, at which time she reported loud snoring and daytime somnolence as well as witnessed apneas. I advised her to return for a sleep study. Her insurance denied unattended sleep study. She had a home sleep test on 12/26/2016 indicating an AHI of 21.8 per hour, O2 nadir of 58% with time below 88% saturation of 46 minutes. Despite these test results her insurance denied a CPAP titration and I placed her on AutoPap therapy.  Today, 04/04/2017 (all dictated new, as well as above notes, some dictation done in note pad or Word, outside of chart, may appear as copied):  I reviewed her AutoPap compliance data from 03/04/2017 through 04/02/2017, which is a total of 30 days, during which time she used her machine every night with percent used days greater than 4 hours at 97%, indicating excellent compliance with an average usage of 6 hours and 38 minutes, residual AHI 0.5 per hour, 95th percentile pressure at 10.5 cm, leak low with the 95th percentile at 3.8 L/m on a pressure range of 5-12 cm. She reports doing a little better, nocturia a little better, but still has tiredness during the day and some difficulty going to sleep, takes occasional xanax. Is trying to be fully compliant with treatment. She is struggling with arthritis pain. Both knees hurt. She is followed by rheumatology. She uses an F 20 full mask, size large.  The patient's allergies, current  medications, family history, past medical history, past social history, past surgical history  and problem list were reviewed and updated as appropriate.   Previously (copied from previous notes for reference):   12/10/2016: (She) reports snoring, and excessive daytime somnolence. I reviewed your office note from 11/12/2016. She was recently hospitalized for in December 2017 from 08/19/16 to 08/22/16 for flare up of her pancreatitis. Her weight fluctuates some. She reports loud snoring per family, she has been told by her daughters that there is pauses in her breathing at times. She lives with one of her daughters, she has 3 grown daughters. She takes care of her 56-year-old grandson. Bedtime and wake time vary, she is in bed typically late, between 1 and 2 AM. Wakeup time is around 8:30 AM. She reports nocturia about 2-3 times per average night and occasional morning headaches, Epworth sleepiness score is 12 out of 24, fatigue score is 63 out of 63. She has right-sided low back pain that stirrups her sleep, she changes position because of that. She denies restless leg symptoms but reports neuropathy. Her diabetes control is better, latest A1c about a month ago was 6.1. She is a nonsmoker, does not drink alcohol, does not drink caffeine daily.    Her Past Medical History Is Significant For: Past Medical History:  Diagnosis Date  . Abnormal Pap smear of cervix 1990   Hx cryotherapy to cervix  . Abnormal uterine bleeding   . Anemia 02/20/16   Transfusion of 1 unit pRBCs  . Anxiety   . Anxiety and depression   . Arthritis   .  Asthma   . 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 Chest pain    per pt due to anemia-   . Diabetes mellitus without complication ([place])   . Fibroid   . Generalized pain   . GERD (gastroesophageal reflux disease)   . Headache(784.0)   . History of blood transfusion 02/20/2016  . Hyperlipidemia   . Hypertension    per Dr. Irven Shelling note 08/30/2011  . Hypothyroidism   . Leg  swelling    bilateral  . Neuromuscular disorder ([place])   . Shortness of breath dyspnea    on exertion due to anemia  . Thyroid disease    Hx 3 goiters--hx thyroidectomy--sees Dr. Forde Dandy    Her Past Surgical History Is Significant For: Past Surgical History:  Procedure Laterality Date  . CARDIAC CATHETERIZATION  2013  . CESAREAN SECTION  1995  . CESAREAN SECTION  1996  . COLONOSCOPY    . CYSTOSCOPY N/A 06/11/2016   Procedure: CYSTOSCOPY;  Surgeon: [name], MD;  Location: [facility];  Service: Gynecology;  Laterality: N/A;  . HYSTEROSCOPY W/D&C N/A 04/10/2016   Procedure: HYSTEROSCOPY WITH fractional dilatation and curretage;  Surgeon: [name], MD;  Location: [facility];  Service: Gynecology;  Laterality: N/A;  . LEFT HEART CATHETERIZATION WITH CORONARY ANGIOGRAM  07/24/2011   Procedure: LEFT HEART CATHETERIZATION WITH CORONARY ANGIOGRAM;  Surgeon: Laverda Page, MD;  Location: [facility] CATH LAB;  Service: Cardiovascular;;  . ROBOTIC ASSISTED TOTAL HYSTERECTOMY WITH SALPINGECTOMY Bilateral 06/11/2016   Procedure: ROBOTIC ASSISTED TOTAL HYSTERECTOMY WITH SALPINGECTOMY;  Surgeon: [name], MD;  Location: [facility];  Service: Gynecology;  Laterality: Bilateral;  . THYROIDECTOMY N/A 12/18/2012   Procedure: TOTAL THYROIDECTOMY;  Surgeon: Earnstine Regal, MD;  Location: [facility];  Service: General;  Laterality: N/A;  . UPPER GI ENDOSCOPY      Her Family History Is Significant For: Family History  Problem Relation Age of Onset  . Heart disease Mother   . Stroke Mother   . Hypertension Mother   . Heart disease Father   . Colon cancer Father   . Hypertension Father   . Diabetes Father   . Cancer Paternal Grandmother   . Asthma Daughter   . Seizures Daughter   . Hypertension Sister   . Thyroid disease Sister   . Mental illness Daughter 66       suicide attempt 06/2013  . Asthma Sister   . Hypertension Sister   . Breast cancer Paternal Aunt   . Cancer  Paternal Aunt     Her Social History Is Significant For: Social History   Social History  . Marital status: Single    Spouse name: n/a  . Number of children: 3  . Years of education: Assoc   Social History Main Topics  . Smoking status: Never Smoker  . Smokeless tobacco: Never Used  . Alcohol use 0.0 oz/week     Comment: Occasional glass of wine  . Drug use: No  . Sexual activity: Not Currently    Partners: Male    Birth control/ protection: Abstinence, Surgical     Comment: Hyst   Other Topics Concern  . None   Social History Narrative   Lives with the youngest of her 3 daughters.   Her oldest daughter lives in [place].   Her middle daughter lives independently, and is due with her first child 12/01/15.   Her son lives with his father, who moved  out 04/2015, nearby.   Caffeine intake varies     Her Allergies Are:  Allergies  Allergen Reactions  . Dilaudid [Hydromorphone Hcl] Itching  . Hydrocodone Itching  . Latex Itching and Other (See Comments)    leaves marks on skin  :   Her Current Medications Are:  Outpatient Encounter Prescriptions as of 04/04/2017  Medication Sig  . albuterol (PROVENTIL HFA;VENTOLIN HFA) 108 (90 Base) MCG/ACT inhaler Inhale 2 puffs into the lungs daily as needed. Reported on 01/04/2016  . ALPRAZolam (XANAX) 0.5 MG tablet Take 1 tablet (0.5 mg total) by mouth at bedtime as needed. for sleep  . atorvastatin (LIPITOR) 20 MG tablet Take 1 tablet (20 mg total) by mouth daily.  . Canagliflozin-Metformin HCl (INVOKAMET) 150-500 MG TABS Take 1 tablet by mouth daily.  . celecoxib (CELEBREX) 200 MG capsule Take 1 capsule (200 mg total) by mouth 2 (two) times daily.  . chlorthalidone (HYGROTON) 25 MG tablet TAKE 1 TABLET(25 MG) BY MOUTH DAILY  . Cholecalciferol (VITAMIN D3) 50000 units CAPS Take 50,000 Units by mouth every Monday.   . cyclobenzaprine (FLEXERIL) 10 MG tablet Take 1 tablet (10 mg total) by mouth 3 (three) times daily as needed for  muscle spasms.  . diazepam (VALIUM) 5 MG tablet Take 1 tablet (5 mg total) by mouth daily. (Patient taking differently: Take 5 mg by mouth daily as needed. )  . docusate sodium (COLACE) 250 MG capsule Take 1 capsule (250 mg total) by mouth daily.  . ferrous sulfate 325 (65 FE) MG tablet Take 325 mg by mouth 3 (three) times daily with meals.  Marland Kitchen glycerin adult 2 g suppository Place 1 suppository rectally as needed for constipation.  Marland Kitchen levothyroxine (SYNTHROID, LEVOTHROID) 300 MCG tablet Take 1 tablet (300 mcg total) by mouth daily.  Marland Kitchen lisinopril (PRINIVIL,ZESTRIL) 5 MG tablet Take 1 tablet (5 mg total) by mouth daily.  Marland Kitchen nystatin (MYCOSTATIN) powder Apply topically 4 (four) times daily. (Patient taking differently: Apply topically 4 (four) times daily as needed (For heat rash.). )  . polyethylene glycol (MIRALAX / GLYCOLAX) packet Take 17 g by mouth 2 (two) times daily. Take 2 packets today (day one), wait 2 hours, if no stool produced then take 2 more packets on day one; then continue taking 2 packets daily until you achieve daily soft stools; if water stool develops then cut back to 1 packet daily.  . potassium chloride SA (K-DUR,KLOR-CON) 20 MEQ tablet Take 1 tablet (20 mEq total) by mouth 2 (two) times daily.  . ranitidine (ZANTAC) 150 MG tablet Take 1 tablet (150 mg total) by mouth 2 (two) times daily.  Marland Kitchen senna (SENOKOT) 8.6 MG TABS tablet Take 1 tablet (8.6 mg total) by mouth daily as needed for mild constipation.  . sertraline (ZOLOFT) 100 MG tablet TAKE 1 TABLET BY MOUTH EVERY DAY  . sucralfate (CARAFATE) 1 G tablet Take 1 tablet (1 g total) by mouth 4 (four) times daily.  . [DISCONTINUED] ondansetron (ZOFRAN ODT) 4 MG disintegrating tablet Take 1 tablet (4 mg total) by mouth every 8 (eight) hours as needed for nausea or vomiting.   No facility-administered encounter medications on file as of 04/04/2017.   :  Review of Systems:  Out of a complete 14 point review of systems, all are reviewed  and negative with the exception of these symptoms as listed below: Review of Systems  Neurological:       Cpap going well. No other concerns    Objective:  Neurological  Exam  Physical Exam Physical Examination:   Vitals:   04/04/17 1349  BP: 140/84  Pulse: 78    General Examination: The patient is a very pleasant 51 y.o. female in no acute distress. She appears well-developed and well-nourished and well groomed. Good spirits.   HEENT: Normocephalic, atraumatic, pupils are equal, round and reactive to light and accommodation. Funduscopic exam is normal with sharp disc margins noted. Extraocular tracking is good without limitation to gaze excursion or nystagmus noted. Normal smooth pursuit is noted. Hearing is grossly intact. Face is symmetric with normal facial animation and normal facial sensation. Speech is clear with no dysarthria noted. There is no hypophonia. There is no lip, neck/head, jaw or voice tremor. Neck is supple with full range of passive and active motion. There are no carotid bruits on auscultation. Oropharynx exam reveals: mild mouth dryness, and moderate airway crowding. No facial or nasal sore. Tongue protrudes centrally and palate elevates symmetrically.    Chest: Clear to auscultation without wheezing, rhonchi or crackles noted.  Heart: S1+S2+0, regular and normal without murmurs, rubs or gallops noted.   Abdomen: Soft, non-tender and non-distended with normal bowel sounds appreciated on auscultation.  Extremities: There is no pitting edema in the distal lower extremities bilaterally.   Skin: Warm and dry without trophic changes noted.  Musculoskeletal: exam reveals knee pain, L more than R. L knee mildly larger than R.   Neurologically:  Mental status: The patient is awake, alert and oriented in all 4 spheres. Her immediate and remote memory, attention, language skills and fund of knowledge are appropriate. There is no evidence of aphasia, agnosia,  apraxia or anomia. Speech is clear with normal prosody and enunciation. Thought process is linear. Mood is normal and affect is normal.  Cranial nerves II - XII are as described above under HEENT exam. In addition: shoulder shrug is normal with equal shoulder height noted. Motor exam: Normal bulk, strength and tone is noted. There is no drift, tremor or rebound. Romberg is negative. Reflexes are 1+ throughout. Fine motor skills and coordination: grossly intact.  Cerebellar testing: No dysmetria or intention tremor on finger to nose testing. There is no truncal or gait ataxia.  Sensory exam: intact to light touch in the upper and lower extremities, with the exception of mild decrease in temperature sense and slight decrease in pinprick sensation in the distal lower extremities bilaterally.  Gait, station and balance: She stands easily. No veering to one side is noted. No leaning to one side is noted. Posture is age-appropriate and stance is narrow based. Gait shows normal stride length and normal pace. No problems turning are noted.               Assessment and Plan:  In summary, Emily Mathis is a very pleasant 51 year old female with an underlying medical history of hypothyroidism, chronic pancreatitis, constipation, iron deficiency anemia, polyarthralgia, type 2 diabetes, reflux disease, and morbid obesity, whoPresents for follow-up consultation of her moderate obstructive sleep apnea as determined by a home sleep test. She did not have an attended sleep study and her insurance also did not approve her for a CPAP titration study, despite evidence of moderate sleep apnea by AHI criteria and severe desaturations. She has established AutoPap therapy at home and is compliant with treatment for which she is commended. She is encouraged to continue with treatment. Furthermore, would like to proceed with an overnight pulse oximetry test while she is using her AutoPap, to make sure her oxygen  saturations are  adequate. Should she have difficulty with compliance or ongoing issues with daytime tiredness or evidence of desaturations during sleep despite using AutoPap appropriately, we would be justified in bringing her back for a overnight CPAP titration study in lab. She is agreeable to this. For now, we will proceed with an overnight pulse oximetry test and call her with her test results. She is encouraged to continue to be fully compliant with treatment. Exam is stable. Reviewed her compliance data with her in detail today. She tolerates a large full facemask at this time. Leak is low. Settings seem to be adequate. I answered all her questions today and we will see her back routinely in 6 months, sooner as needed.  I spent 25 minutes in total face-to-face time with the patient, more than 50% of which was spent in counseling and coordination of care, reviewing test results, reviewing medication and discussing or reviewing the diagnosis of OSA and nocturnal hypoxemia, the prognosis and treatment options. Pertinent laboratory and imaging test results that were available during this visit with the patient were reviewed by me and considered in my medical decision making (see chart for details).

## 2017-04-04 NOTE — Patient Instructions (Addendum)
Please continue using your autoPAP regularly. While your insurance requires that you use PAP at least 4 hours each night on 70% of the nights, I recommend, that you not skip any nights and use it throughout the night if you can. Getting used to PAP and staying with the treatment long term does take time and patience and discipline. Untreated obstructive sleep apnea when it is moderate to severe can have an adverse impact on cardiovascular health and raise her risk for heart disease, arrhythmias, hypertension, congestive heart failure, stroke and diabetes. Untreated obstructive sleep apnea causes sleep disruption, nonrestorative sleep, and sleep deprivation. This can have an impact on your day to day functioning and cause daytime sleepiness and impairment of cognitive function, memory loss, mood disturbance, and problems focussing. Using PAP regularly can improve these symptoms.  We will do an overnight oxygen level test, called ONO, and your DME company will call and set this up for one night, while you also use your autoPAP. We will call you with the results.   You can try Melatonin at night for sleep: take 1 mg to 3 mg, one to 2 hours before your bedtime. You can go up to 5 mg if needed. It is over the counter and comes in pill form, chewable form and spray, if you prefer.    We will do a 6 month check up and if needed we may bring you back for a CPAP titration study.

## 2017-04-09 DIAGNOSIS — M25552 Pain in left hip: Secondary | ICD-10-CM | POA: Diagnosis not present

## 2017-04-09 DIAGNOSIS — M545 Low back pain: Secondary | ICD-10-CM | POA: Diagnosis not present

## 2017-04-09 DIAGNOSIS — R0902 Hypoxemia: Secondary | ICD-10-CM | POA: Diagnosis not present

## 2017-04-09 DIAGNOSIS — M25511 Pain in right shoulder: Secondary | ICD-10-CM | POA: Diagnosis not present

## 2017-04-15 ENCOUNTER — Ambulatory Visit
Admission: RE | Admit: 2017-04-15 | Discharge: 2017-04-15 | Disposition: A | Discharge status: Home / Self Care | Payer: BLUE CROSS/BLUE SHIELD | Source: Ambulatory Visit | Attending: Obstetrics and Gynecology | Admitting: Obstetrics and Gynecology

## 2017-04-15 DIAGNOSIS — Z1231 Encounter for screening mammogram for malignant neoplasm of breast: Secondary | ICD-10-CM | POA: Diagnosis not present

## 2017-04-16 ENCOUNTER — Telehealth: Payer: Self-pay | Admitting: Neurology

## 2017-04-16 DIAGNOSIS — R0902 Hypoxemia: Secondary | ICD-10-CM | POA: Diagnosis not present

## 2017-04-16 NOTE — Telephone Encounter (Signed)
I reviewed the patient's pulse oximetry test from 04/09/2017 but there were multiple gaps in the data and patient appeared to have used it only during the day from about 10:50 AM through 6:30 PM the same day. We will get in touch with her DME company to make sure the patient understands that this is an overnight test during which time she is supposed to use her AutoPap and sleep with the sensor on all night long.   We will try to get a repeat of this test done. No further action required at this time. Thanks for calling [facility].

## 2017-04-22 DIAGNOSIS — G4733 Obstructive sleep apnea (adult) (pediatric): Secondary | ICD-10-CM | POA: Diagnosis not present

## 2017-04-25 NOTE — Telephone Encounter (Signed)
I reached out to [facility] for the results. I do not have them yet.

## 2017-04-25 NOTE — Telephone Encounter (Signed)
Pt is calling asking for the results of the test, please call

## 2017-04-25 NOTE — Telephone Encounter (Signed)
Per [facility]: "She has not been retested yet, I am requesting an update and will let you know as soon as I find out more."

## 2017-04-25 NOTE — Telephone Encounter (Signed)
I called pt, explained that there were multiple gaps in the ONO result data and that pt will need to repeat the ONO. I asked to her use the device at night with her auto pap. [facility] will reach out to this pt to get the repeat ONO completed. Pt verbalized understanding.

## 2017-05-13 ENCOUNTER — Telehealth: Payer: Self-pay | Admitting: Neurology

## 2017-05-13 NOTE — Telephone Encounter (Signed)
I reviewed patient's overnight pulse oximetry test results from 05/06/2017 through 05/07/2017, total duration of testing time was 7 hours and 47 minutes, average oxygen saturation was 94.1%, nadir was 88%. Test condition states overnight on room air but does not specify while on AutoPap. Please verify with the patient that she was indeed on her AutoPap at the time of ONO testing. As it stands, she is appropriately treated with her AutoPap machine and does not require any additional attention to oxygen desaturations while on treatment. Follow-up as planned routinely next year. No other action required.

## 2017-05-14 NOTE — Telephone Encounter (Signed)
Pt called about test result. I told her RN would call her back, the RN has not had a chance to look at notes from provider yet

## 2017-05-14 NOTE — Telephone Encounter (Signed)
I called the pt. She reports that she was using the auto pap the night of her ONO. I advised her that her ONO showed that she is adequately treated with her auto pap and does not require any additional attention to oxygen desaturations while on treatment. Pt will follow up as scheduled for next year. Pt verbalized understanding of results. Pt had no questions at this time but was encouraged to call back if questions arise.

## 2017-05-23 DIAGNOSIS — G4733 Obstructive sleep apnea (adult) (pediatric): Secondary | ICD-10-CM | POA: Diagnosis not present

## 2017-06-12 DIAGNOSIS — M791 Myalgia: Secondary | ICD-10-CM | POA: Diagnosis not present

## 2017-06-12 DIAGNOSIS — E559 Vitamin D deficiency, unspecified: Secondary | ICD-10-CM | POA: Diagnosis not present

## 2017-06-12 DIAGNOSIS — M7062 Trochanteric bursitis, left hip: Secondary | ICD-10-CM | POA: Diagnosis not present

## 2017-06-12 DIAGNOSIS — R202 Paresthesia of skin: Secondary | ICD-10-CM | POA: Diagnosis not present

## 2017-06-12 DIAGNOSIS — M2550 Pain in unspecified joint: Secondary | ICD-10-CM | POA: Diagnosis not present

## 2017-06-12 DIAGNOSIS — D509 Iron deficiency anemia, unspecified: Secondary | ICD-10-CM | POA: Diagnosis not present

## 2017-06-12 DIAGNOSIS — Z23 Encounter for immunization: Secondary | ICD-10-CM | POA: Diagnosis not present

## 2017-06-12 DIAGNOSIS — M150 Primary generalized (osteo)arthritis: Secondary | ICD-10-CM | POA: Diagnosis not present

## 2017-06-12 DIAGNOSIS — E119 Type 2 diabetes mellitus without complications: Secondary | ICD-10-CM | POA: Diagnosis not present

## 2017-06-12 DIAGNOSIS — G4733 Obstructive sleep apnea (adult) (pediatric): Secondary | ICD-10-CM | POA: Diagnosis not present

## 2017-06-12 DIAGNOSIS — E784 Other hyperlipidemia: Secondary | ICD-10-CM | POA: Diagnosis not present

## 2017-06-12 DIAGNOSIS — Z6841 Body Mass Index (BMI) 40.0 and over, adult: Secondary | ICD-10-CM | POA: Diagnosis not present

## 2017-06-12 DIAGNOSIS — E890 Postprocedural hypothyroidism: Secondary | ICD-10-CM | POA: Diagnosis not present

## 2017-06-18 DIAGNOSIS — M25562 Pain in left knee: Secondary | ICD-10-CM | POA: Diagnosis not present

## 2017-06-18 DIAGNOSIS — G8929 Other chronic pain: Secondary | ICD-10-CM | POA: Diagnosis not present

## 2017-06-18 DIAGNOSIS — M25561 Pain in right knee: Secondary | ICD-10-CM | POA: Diagnosis not present

## 2017-06-18 DIAGNOSIS — M170 Bilateral primary osteoarthritis of knee: Secondary | ICD-10-CM | POA: Diagnosis not present

## 2017-06-22 DIAGNOSIS — G4733 Obstructive sleep apnea (adult) (pediatric): Secondary | ICD-10-CM | POA: Diagnosis not present

## 2017-07-01 DIAGNOSIS — G8929 Other chronic pain: Secondary | ICD-10-CM | POA: Diagnosis not present

## 2017-07-01 DIAGNOSIS — M5442 Lumbago with sciatica, left side: Secondary | ICD-10-CM | POA: Diagnosis not present

## 2017-07-05 DIAGNOSIS — M1711 Unilateral primary osteoarthritis, right knee: Secondary | ICD-10-CM | POA: Diagnosis not present

## 2017-07-06 DIAGNOSIS — M5442 Lumbago with sciatica, left side: Secondary | ICD-10-CM | POA: Diagnosis not present

## 2017-07-06 DIAGNOSIS — G8929 Other chronic pain: Secondary | ICD-10-CM | POA: Diagnosis not present

## 2017-07-12 DIAGNOSIS — M5442 Lumbago with sciatica, left side: Secondary | ICD-10-CM | POA: Diagnosis not present

## 2017-07-12 DIAGNOSIS — G8929 Other chronic pain: Secondary | ICD-10-CM | POA: Diagnosis not present

## 2017-07-23 DIAGNOSIS — M170 Bilateral primary osteoarthritis of knee: Secondary | ICD-10-CM | POA: Diagnosis not present

## 2017-07-23 DIAGNOSIS — G4733 Obstructive sleep apnea (adult) (pediatric): Secondary | ICD-10-CM | POA: Diagnosis not present

## 2017-07-31 DIAGNOSIS — M25561 Pain in right knee: Secondary | ICD-10-CM | POA: Diagnosis not present

## 2017-07-31 DIAGNOSIS — M25562 Pain in left knee: Secondary | ICD-10-CM | POA: Diagnosis not present

## 2017-07-31 DIAGNOSIS — G8929 Other chronic pain: Secondary | ICD-10-CM | POA: Diagnosis not present

## 2017-07-31 DIAGNOSIS — M170 Bilateral primary osteoarthritis of knee: Secondary | ICD-10-CM | POA: Diagnosis not present

## 2017-08-06 DIAGNOSIS — M419 Scoliosis, unspecified: Secondary | ICD-10-CM

## 2017-08-06 DIAGNOSIS — M5441 Lumbago with sciatica, right side: Secondary | ICD-10-CM | POA: Diagnosis not present

## 2017-08-06 DIAGNOSIS — M5136 Other intervertebral disc degeneration, lumbar region: Secondary | ICD-10-CM | POA: Diagnosis not present

## 2017-08-06 HISTORY — DX: Scoliosis, unspecified: M41.9

## 2017-08-07 DIAGNOSIS — M170 Bilateral primary osteoarthritis of knee: Secondary | ICD-10-CM | POA: Diagnosis not present

## 2017-08-15 ENCOUNTER — Encounter: Payer: Self-pay | Admitting: Physician Assistant

## 2017-08-15 ENCOUNTER — Ambulatory Visit: Payer: BLUE CROSS/BLUE SHIELD | Admitting: Physician Assistant

## 2017-08-15 ENCOUNTER — Telehealth: Payer: Self-pay

## 2017-08-15 ENCOUNTER — Other Ambulatory Visit: Payer: Self-pay

## 2017-08-15 VITALS — BP 120/80 | HR 96 | Temp 98.6°F | Resp 18 | Ht 63.0 in | Wt 268.4 lb

## 2017-08-15 DIAGNOSIS — E119 Type 2 diabetes mellitus without complications: Secondary | ICD-10-CM

## 2017-08-15 DIAGNOSIS — D509 Iron deficiency anemia, unspecified: Secondary | ICD-10-CM | POA: Diagnosis not present

## 2017-08-15 DIAGNOSIS — K5900 Constipation, unspecified: Secondary | ICD-10-CM | POA: Diagnosis not present

## 2017-08-15 DIAGNOSIS — K861 Other chronic pancreatitis: Secondary | ICD-10-CM | POA: Diagnosis not present

## 2017-08-15 DIAGNOSIS — F430 Acute stress reaction: Secondary | ICD-10-CM | POA: Diagnosis not present

## 2017-08-15 DIAGNOSIS — R1084 Generalized abdominal pain: Secondary | ICD-10-CM

## 2017-08-15 MED ORDER — SERTRALINE HCL 100 MG PO TABS: 100.0000 mg | ORAL_TABLET | Freq: Every day | ORAL | 5 refills | Status: DC

## 2017-08-15 MED ORDER — ALPRAZOLAM 0.5 MG PO TABS: 0.5000 mg | ORAL_TABLET | Freq: Every evening | ORAL | 0 refills | Status: DC | PRN

## 2017-08-15 MED ORDER — DIAZEPAM 5 MG PO TABS: 5.0000 mg | ORAL_TABLET | Freq: Every day | ORAL | 0 refills | Status: DC | PRN

## 2017-08-15 NOTE — Progress Notes (Signed)
Subjective:    Patient ID: Emily Mathis, female    DOB: July 27, 1966, 51 y.o.   MRN: [id number]  HPI    Emily Mathis is a 51 y.o female with a past medical history significant for diabetes, hypothyroidism, GERD, anxiety and depression who presents for evaluation of abdominal pain and depression.   Patient states that she has had abdominal pain for about an year now. She was hospitalized on 08/2016 and was found to have pancreatitis and stool impaction. She rates the pain at a 8/10. The pain is generalized throughout her abdomen but is most prominent in the epigastric and bilateral upper quadrants. She has been trying to eat less due to gassiness and bloating. She endorses constipation stating that she has at most 2 bowel movements per week. She describes her stool as "pebbles". She denies blood in her stools.  Emily Mathis endorses states that she has been struggling with financial strain. She is currently working to obtain disability. She states that "she has a lot on her plate" as she is worried about her children and the financial strains that they are having as well. She takes her Zoloft daily and feels like it helps a lot. She also takes Valium or Xanax as needed.     Review of Systems  Constitutional: Negative for appetite change, chills and fever.  Respiratory: Positive for shortness of breath (when laying supine).   Cardiovascular: Positive for chest pain (with laying supine).  Gastrointestinal: Positive for abdominal pain, constipation and nausea. Negative for diarrhea and vomiting.  Genitourinary: Negative for difficulty urinating.  Musculoskeletal: Positive for arthralgias (bilateral knees) and back pain.  Psychiatric/Behavioral: The patient is nervous/anxious (if she does not take her medications).     Patient Active Problem List   Diagnosis Date Noted  . Abdominal pain   . Chronic pancreatitis ([place])   . Constipation   . Status post laparoscopic hysterectomy 06/11/2016  .  Iron deficiency anemia 02/15/2016  . Myalgia 02/15/2016  . Upper airway cough syndrome 10/21/2015  . Polyarthralgia 06/16/2015  . Diabetes mellitus type 2, uncomplicated ([place]) 02/63/7858  . Keloid scar, cervical incision 09/02/2013  . Reaction, situational, acute, to stress 07/02/2013  . 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 BMI 45.0-49.9, adult ([place]) 03/27/2013  . GERD (gastroesophageal reflux disease) 02/15/2013  . Hypocalcemia 01/07/2013  . Chest pain on exertion 07/24/2011    Past Surgical History:  Procedure Laterality Date  . CARDIAC CATHETERIZATION  2013  . CESAREAN SECTION  1995  . CESAREAN SECTION  1996  . COLONOSCOPY    . CYSTOSCOPY N/A 06/11/2016   Procedure: CYSTOSCOPY;  Surgeon: [name], MD;  Location: [facility];  Service: Gynecology;  Laterality: N/A;  . HYSTEROSCOPY W/D&C N/A 04/10/2016   Procedure: HYSTEROSCOPY WITH fractional dilatation and curretage;  Surgeon: [name], MD;  Location: [facility];  Service: Gynecology;  Laterality: N/A;  . LEFT HEART CATHETERIZATION WITH CORONARY ANGIOGRAM  07/24/2011   Procedure: LEFT HEART CATHETERIZATION WITH CORONARY ANGIOGRAM;  Surgeon: Laverda Page, MD;  Location: [facility];  Service: Cardiovascular;;  . ROBOTIC ASSISTED TOTAL HYSTERECTOMY WITH SALPINGECTOMY Bilateral 06/11/2016   Procedure: ROBOTIC ASSISTED TOTAL HYSTERECTOMY WITH SALPINGECTOMY;  Surgeon: [name], MD;  Location: [facility];  Service: Gynecology;  Laterality: Bilateral;  . THYROIDECTOMY N/A 12/18/2012   Procedure: TOTAL THYROIDECTOMY;  Surgeon: Earnstine Regal, MD;  Location: [facility];  Service: General;  Laterality: N/A;  . UPPER GI ENDOSCOPY  Allergies  Allergen Reactions  . Dilaudid [Hydromorphone Hcl] Itching  . Hydrocodone Itching  . Latex Itching and Other (See Comments)    leaves marks on skin   Outpatient Medications Prior to Visit  Medication Sig Dispense Refill  . albuterol (PROVENTIL HFA;VENTOLIN  HFA) 108 (90 Base) MCG/ACT inhaler Inhale 2 puffs into the lungs daily as needed. Reported on 01/04/2016 18 g 1  . atorvastatin (LIPITOR) 20 MG tablet Take 1 tablet (20 mg total) by mouth daily. 90 tablet 3  . Canagliflozin-Metformin HCl (INVOKAMET) 150-500 MG TABS Take 1 tablet by mouth daily. 90 tablet 3  . celecoxib (CELEBREX) 200 MG capsule Take 1 capsule (200 mg total) by mouth 2 (two) times daily. 60 capsule 1  . chlorthalidone (HYGROTON) 25 MG tablet TAKE 1 TABLET(25 MG) BY MOUTH DAILY 90 tablet 0  . Cholecalciferol (VITAMIN D3) 50000 units CAPS Take 50,000 Units by mouth every Monday.   3  . cyclobenzaprine (FLEXERIL) 10 MG tablet Take 1 tablet (10 mg total) by mouth 3 (three) times daily as needed for muscle spasms. 30 tablet 0  . docusate sodium (COLACE) 250 MG capsule Take 1 capsule (250 mg total) by mouth daily. 30 capsule 0  . ferrous sulfate 325 (65 FE) MG tablet Take 325 mg by mouth 3 (three) times daily with meals.    Marland Kitchen glycerin adult 2 g suppository Place 1 suppository rectally as needed for constipation.    Marland Kitchen levothyroxine (SYNTHROID, LEVOTHROID) 300 MCG tablet Take 1 tablet (300 mcg total) by mouth daily. 90 tablet 3  . lisinopril (PRINIVIL,ZESTRIL) 5 MG tablet Take 1 tablet (5 mg total) by mouth daily. 90 tablet 3  . nystatin (MYCOSTATIN) powder Apply topically 4 (four) times daily. (Patient taking differently: Apply topically 4 (four) times daily as needed (For heat rash.). ) 60 g 2  . polyethylene glycol (MIRALAX / GLYCOLAX) packet Take 17 g by mouth 2 (two) times daily. Take 2 packets today (day one), wait 2 hours, if no stool produced then take 2 more packets on day one; then continue taking 2 packets daily until you achieve daily soft stools; if water stool develops then cut back to 1 packet daily. 14 each 0  . potassium chloride SA (K-DUR,KLOR-CON) 20 MEQ tablet Take 1 tablet (20 mEq total) by mouth 2 (two) times daily. 60 tablet 0  . ranitidine (ZANTAC) 150 MG tablet Take 1  tablet (150 mg total) by mouth 2 (two) times daily. 60 tablet 3  . senna (SENOKOT) 8.6 MG TABS tablet Take 1 tablet (8.6 mg total) by mouth daily as needed for mild constipation. 120 each 0  . sucralfate (CARAFATE) 1 G tablet Take 1 tablet (1 g total) by mouth 4 (four) times daily. 30 tablet 0  . ALPRAZolam (XANAX) 0.5 MG tablet Take 1 tablet (0.5 mg total) by mouth at bedtime as needed. for sleep 30 tablet 0  . diazepam (VALIUM) 5 MG tablet Take 1 tablet (5 mg total) by mouth daily. (Patient taking differently: Take 5 mg by mouth daily as needed. ) 30 tablet 0  . sertraline (ZOLOFT) 100 MG tablet TAKE 1 TABLET BY MOUTH EVERY DAY 30 tablet 1   No facility-administered medications prior to visit.       Objective:   Physical Exam  Constitutional: She is oriented to person, place, and time. She appears well-developed and well-nourished. No distress.  HENT:  Head: Normocephalic and atraumatic.  Mouth/Throat: Oropharynx is clear and moist.  Eyes: Pupils are equal,  round, and reactive to light.  Cardiovascular: Normal rate, regular rhythm, normal heart sounds and intact distal pulses.  Pulmonary/Chest: Breath sounds normal.  Abdominal: Soft. Bowel sounds are normal. She exhibits no distension. There is tenderness (to palpation epigastrum and bilateral upper and lower quadrants. Worse in epigastric and upper quadrants).  Lymphadenopathy:    She has no cervical adenopathy.  Neurological: She is alert and oriented to person, place, and time.  Skin: Skin is warm.  Psychiatric: She has a normal mood and affect.      Assessment & Plan:   1. Abdominal pain, unspecified 2. Depression and anxiety  -Continue medications as prescribed -Ordered CBC w/ diff, CMP, lipase and amylase.  -Patient education information provided.  -Patient should follow up if symptoms persist or worsen.  [place], [place]

## 2017-08-15 NOTE — Patient Instructions (Addendum)
1. IF we can, we will STOP the iron. 2. Take Magnesium 250 mg every day. 3. Use Miralax twice a day. If that isn't enough, then ADD the colace. If that still isn't enough, add the glycerin suppository. 4. STOP the Senekot and the sucralfate (Carafate) for now. 5. CONTINUE the Zantac (ranitidine)   IF you received an x-ray today, you will receive an invoice from [facility]. Please contact [facility] at [phone] with questions or concerns regarding your invoice.   IF you received labwork today, you will receive an invoice from [facility]. Please contact [facility] at [phone] with questions or concerns regarding your invoice.   Our billing staff will not be able to assist you with questions regarding bills from these companies.  You will be contacted with the lab results as soon as they are available. The fastest way to get your results is to activate your My Chart account. Instructions are located on the last page of this paperwork. If you have not heard from [place] regarding the results in 2 weeks, please contact this office.

## 2017-08-15 NOTE — Telephone Encounter (Signed)
L/m at [facility].  Harrison Mons PA-C requesting records for last visit and labs with Dr. Forde Dandy.  L/m with [facility].  Harrison Mons PA-C requesting notes from last OV.

## 2017-08-15 NOTE — Progress Notes (Signed)
Patient ID: Emily Mathis, female    DOB: August 18, 1966, 51 y.o.   MRN: [id number]  PCP: Porfirio Oar, PA-C  Chief Complaint  Patient presents with  . Abdominal Pain    Pt states she is still have stomach pain. Pt states she isn't eating much because when she does she startes to bloat. Pt state she is still having trouble using the bathroom.  . Follow-up  . Depression    Depression scale score 10  . Medication Refill    Xanax 0.5 MG, Valium 5 MG, Zoloft 100 MG    Subjective:   Presents for evaluation of several issues.  1. Abdominal pain and bloating x 1 year. 8/10. Intermittent, crampy, epigastrum. Hospitalized with pancreatitis and bowel obstruction in 08/2016. Bloating, so trying to eat less. Chronic constipation. Taking a laxative, which helps with the symptoms. BM BIW. Not bloody. Occasionally loose (once weekly), described as shiney. Uses docusate and Miralax. Occasional nausea. Notes weight changes.  2. Occasional CP and SOB, especially with lying down. This is long-standing and not changed in quality, frequency or duration. Originally diagnosed as asthma, these are triggered by stress and anxiety.   3. Has just been diagnosed with scoliosis by orthopedics (Dr. Shon Baton). Has bulging disc. Received injection in the back on 11/21. Bilateral knee viscosupplementation injections to address bilateral OA. I don't have notes.  4. Last saw endocrinology in September. A1C was 6.9%. TSH reportedly still 80. BP was 140/100. I don't have notes.  5. Last saw rheumatology 02/2017.  Still working to obtain disability. Finances add strain. Her daughter is pregnant with a second child. This daughter and another daughter live with her, along with her grandson, "Nugget," who is 47 months old. Her oldest daughter and son-in-law have moved back to [place]. They both have disabilities, and she's working to help them. Only one car amongst them all. Doesn't like depending on  others. Enjoys being able to support other people.   Review of Systems  Constitutional: Positive for unexpected weight change. Negative for activity change, appetite change and fatigue.  HENT: Negative for congestion, dental problem, ear pain, hearing loss, mouth sores, postnasal drip, rhinorrhea, sneezing, sore throat, tinnitus and trouble swallowing.   Eyes: Negative for photophobia, pain, redness and visual disturbance.  Respiratory: Positive for shortness of breath. Negative for cough and chest tightness.   Cardiovascular: Positive for chest pain. Negative for palpitations and leg swelling.  Gastrointestinal: Positive for abdominal distention (bloating), constipation and nausea. Negative for abdominal pain, blood in stool, diarrhea and vomiting.  Endocrine: Negative for cold intolerance, heat intolerance, polydipsia, polyphagia and polyuria.  Genitourinary: Negative for dysuria, frequency, hematuria and urgency.  Musculoskeletal: Negative for arthralgias, gait problem, myalgias and neck stiffness.  Skin: Negative for rash.  Neurological: Negative for dizziness, speech difficulty, weakness, light-headedness, numbness and headaches.  Hematological: Negative for adenopathy.  Psychiatric/Behavioral: Negative for confusion and sleep disturbance. The patient is not nervous/anxious.        Patient Active Problem List   Diagnosis Date Noted  . Hypothyroidism   . Abdominal pain   . Chronic pancreatitis ([facility])   . Constipation   . Pancreatitis 08/19/2016  . Status post laparoscopic hysterectomy 06/11/2016  . Iron deficiency anemia 02/15/2016  . Myalgia 02/15/2016  . Upper airway cough syndrome 10/21/2015  . Polyarthralgia 06/16/2015  . Diabetes mellitus type 2, uncomplicated ([facility]) 09/14/2013  . Keloid scar, cervical incision 09/02/2013  . Reaction, situational, acute, to stress 07/02/2013  . 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  BMI 45.0-49.9, adult ([facility]) 03/27/2013  . GERD  (gastroesophageal reflux disease) 02/15/2013  . Hypocalcemia 01/07/2013  . Chest pain on exertion 07/24/2011     Prior to Admission medications   Medication Sig Start Date End Date Taking? Authorizing Provider  albuterol (PROVENTIL HFA;VENTOLIN HFA) 108 (90 Base) MCG/ACT inhaler Inhale 2 puffs into the lungs daily as needed. Reported on 01/04/2016 03/28/16  Yes Taje Tondreau, PA-C  ALPRAZolam (XANAX) 0.5 MG tablet Take 1 tablet (0.5 mg total) by mouth at bedtime as needed. for sleep 03/06/17  Yes Lalla Laham, PA-C  atorvastatin (LIPITOR) 20 MG tablet Take 1 tablet (20 mg total) by mouth daily. 11/13/16  Yes Williamson Cavanah, PA-C  Canagliflozin-Metformin HCl (INVOKAMET) 150-500 MG TABS Take 1 tablet by mouth daily. 03/06/17  Yes Norvel Wenker, PA-C  celecoxib (CELEBREX) 200 MG capsule Take 1 capsule (200 mg total) by mouth 2 (two) times daily. 09/06/16  Yes Indy Prestwood, PA-C  chlorthalidone (HYGROTON) 25 MG tablet TAKE 1 TABLET(25 MG) BY MOUTH DAILY 09/05/16  Yes Porfirio Oar, PA-C  Cholecalciferol (VITAMIN D3) 50000 units CAPS Take 50,000 Units by mouth every Monday.  06/14/16  Yes [provider]  cyclobenzaprine (FLEXERIL) 10 MG tablet Take 1 tablet (10 mg total) by mouth 3 (three) times daily as needed for muscle spasms. 11/12/16  Yes Courtlynn Holloman, PA-C  diazepam (VALIUM) 5 MG tablet Take 1 tablet (5 mg total) by mouth daily. Patient taking differently: Take 5 mg by mouth daily as needed.  07/03/16  Yes Taneia Mealor, PA-C  docusate sodium (COLACE) 250 MG capsule Take 1 capsule (250 mg total) by mouth daily. 09/06/16  Yes Saidah Kempton, PA-C  ferrous sulfate 325 (65 FE) MG tablet Take 325 mg by mouth 3 (three) times daily with meals.   Yes [provider]  glycerin adult 2 g suppository Place 1 suppository rectally as needed for constipation.   Yes [provider]  levothyroxine (SYNTHROID, LEVOTHROID) 300 MCG tablet Take 1 tablet (300 mcg total) by  mouth daily. 06/17/15  Yes Rashae Rother, PA-C  lisinopril (PRINIVIL,ZESTRIL) 5 MG tablet Take 1 tablet (5 mg total) by mouth daily. 11/12/16  Yes Nashira Mcglynn, PA-C  nystatin (MYCOSTATIN) powder Apply topically 4 (four) times daily. Patient taking differently: Apply topically 4 (four) times daily as needed (For heat rash.).  10/02/15  Yes Miami Latulippe, PA-C  polyethylene glycol (MIRALAX / GLYCOLAX) packet Take 17 g by mouth 2 (two) times daily. Take 2 packets today (day one), wait 2 hours, if no stool produced then take 2 more packets on day one; then continue taking 2 packets daily until you achieve daily soft stools; if water stool develops then cut back to 1 packet daily. 07/24/16  Yes Street, [place], PA-C  potassium chloride SA (K-DUR,KLOR-CON) 20 MEQ tablet Take 1 tablet (20 mEq total) by mouth 2 (two) times daily. 06/12/16  Yes Patton Salles, MD  ranitidine (ZANTAC) 150 MG tablet Take 1 tablet (150 mg total) by mouth 2 (two) times daily. 02/05/17  Yes Magdelena Kinsella, PA-C  senna (SENOKOT) 8.6 MG TABS tablet Take 1 tablet (8.6 mg total) by mouth daily as needed for mild constipation. 08/22/16  Yes Mikell, Antionette Poles, MD  sertraline (ZOLOFT) 100 MG tablet TAKE 1 TABLET BY MOUTH EVERY DAY 08/18/16  Yes Shade Flood, MD  sucralfate (CARAFATE) 1 G tablet Take 1 tablet (1 g total) by mouth 4 (four) times daily. 04/19/15  Yes Marisa Severin, MD  Allergies  Allergen Reactions  . Dilaudid [Hydromorphone Hcl] Itching  . Hydrocodone Itching  . Latex Itching and Other (See Comments)    leaves marks on skin       Objective:  Physical Exam  Constitutional: She is oriented to person, place, and time. She appears well-developed and well-nourished. She is active and cooperative. No distress.  BP 120/80 (BP Location: Right Arm, Patient Position: Sitting, Cuff Size: Large)   Pulse 96   Temp 98.6 F (37 C) (Oral)   Resp 18   Ht 5\' 3"  (1.6 m)   Wt 268 lb 6.4 oz (121.7 kg)   LMP  05/08/2016 (Exact Date)   SpO2 97%   BMI 47.54 kg/m   HENT:  Head: Normocephalic and atraumatic.  Right Ear: Hearing normal.  Left Ear: Hearing normal.  Eyes: Conjunctivae are normal. No scleral icterus.  Neck: Normal range of motion. Neck supple. No thyromegaly present.  Cardiovascular: Normal rate, regular rhythm and normal heart sounds.  Pulses:      Radial pulses are 2+ on the right side, and 2+ on the left side.  Pulmonary/Chest: Effort normal and breath sounds normal.  Abdominal: Soft. Normal appearance and bowel sounds are normal. There is no hepatosplenomegaly. There is tenderness in the right upper quadrant, epigastric area and left upper quadrant. There is no rigidity, no rebound, no guarding, no tenderness at McBurney's point and negative Murphy's sign.  Lymphadenopathy:       Head (right side): No tonsillar, no preauricular, no posterior auricular and no occipital adenopathy present.       Head (left side): No tonsillar, no preauricular, no posterior auricular and no occipital adenopathy present.    She has no cervical adenopathy.       Right: No supraclavicular adenopathy present.       Left: No supraclavicular adenopathy present.  Neurological: She is alert and oriented to person, place, and time. No sensory deficit.  Skin: Skin is warm, dry and intact. No rash noted. No cyanosis or erythema. Nails show no clubbing.  Psychiatric: She has a normal mood and affect. Her speech is normal and behavior is normal.    Wt Readings from Last 3 Encounters:  08/15/17 268 lb 6.4 oz (121.7 kg)  04/04/17 267 lb (121.1 kg)  02/21/17 261 lb (118.4 kg)          Assessment & Plan:   Problem List Items Addressed This Visit    Diabetes mellitus type 2, uncomplicated ([facility]) (Chronic)    Request notes from Dr. Evlyn Kanner to update our record.      Reaction, situational, acute, to stress    Now exacerbated by significant financial and transportation strain. I support her application for  disability. Reminded her that her family wants to help her, too.       Relevant Medications   sertraline (ZOLOFT) 100 MG tablet   diazepam (VALIUM) 5 MG tablet   ALPRAZolam (XANAX) 0.5 MG tablet   Iron deficiency anemia    This was thought due to heavy menses. She has since had a hysterectomy. As iron supplement may contribute to constipation, plan to d/c this with anticipated normal CBC.      Abdominal pain - Primary    Patient has known chronic pancreatitis, but isn't seeing GI regularly and doesn't take anything for this. Constipation likely contributes. Await notes from endocrinology to verify euthyroid. Plan to stop iron supplement, presuming hgb is normal.       Relevant Orders   CBC  with Differential/Platelet (Completed)   Comprehensive metabolic panel (Completed)   Chronic pancreatitis ([facility]CC)    Await labs. Anticipate recommending follow-up with GI.      Relevant Orders   Amylase (Completed)   Lipase (Completed)   Constipation    Verify euthyroid with records for endocrinology. Hope to stop iron supplement. Colonoscopy 2017. Anticipate need for re-evaluation with GI.          Return in about 4 weeks (around 09/12/2017) for re-evaluate mood and abdominal pain.   Fernande Bras, PA-C [facility] at [facility]

## 2017-08-16 LAB — CBC WITH DIFFERENTIAL/PLATELET
BASOS: 0 %
Basophils Absolute: 0 10*3/uL (ref 0.0–0.2)
EOS (ABSOLUTE): 0.1 10*3/uL (ref 0.0–0.4)
EOS: 1 %
HEMATOCRIT: 36.9 % (ref 34.0–46.6)
HEMOGLOBIN: 11.9 g/dL (ref 11.1–15.9)
IMMATURE GRANULOCYTES: 0 %
Immature Grans (Abs): 0 10*3/uL (ref 0.0–0.1)
LYMPHS ABS: 2.8 10*3/uL (ref 0.7–3.1)
Lymphs: 35 %
MCH: 28.3 pg (ref 26.6–33.0)
MCHC: 32.2 g/dL (ref 31.5–35.7)
MCV: 88 fL (ref 79–97)
MONOCYTES: 5 %
MONOS ABS: 0.4 10*3/uL (ref 0.1–0.9)
Neutrophils Absolute: 4.8 10*3/uL (ref 1.4–7.0)
Neutrophils: 59 %
Platelets: 527 10*3/uL — ABNORMAL HIGH (ref 150–379)
RBC: 4.21 x10E6/uL (ref 3.77–5.28)
RDW: 16.4 % — ABNORMAL HIGH (ref 12.3–15.4)
WBC: 8.1 10*3/uL (ref 3.4–10.8)

## 2017-08-16 LAB — COMPREHENSIVE METABOLIC PANEL
A/G RATIO: 1.2 (ref 1.2–2.2)
ALBUMIN: 4.3 g/dL (ref 3.5–5.5)
ALT: 18 IU/L (ref 0–32)
AST: 15 IU/L (ref 0–40)
Alkaline Phosphatase: 100 IU/L (ref 39–117)
BUN / CREAT RATIO: 11 (ref 9–23)
BUN: 10 mg/dL (ref 6–24)
Bilirubin Total: 0.4 mg/dL (ref 0.0–1.2)
CALCIUM: 9.4 mg/dL (ref 8.7–10.2)
CO2: 29 mmol/L (ref 20–29)
CREATININE: 0.9 mg/dL (ref 0.57–1.00)
Chloride: 98 mmol/L (ref 96–106)
GFR calc Af Amer: 86 mL/min/{1.73_m2} (ref >59)
GFR, EST NON AFRICAN AMERICAN: 75 mL/min/{1.73_m2} (ref >59)
GLOBULIN, TOTAL: 3.6 g/dL (ref 1.5–4.5)
Glucose: 112 mg/dL — ABNORMAL HIGH (ref 65–99)
POTASSIUM: 3.9 mmol/L (ref 3.5–5.2)
SODIUM: 143 mmol/L (ref 134–144)
Total Protein: 7.9 g/dL (ref 6.0–8.5)

## 2017-08-16 LAB — LIPASE: Lipase: 16 U/L (ref 14–72)

## 2017-08-16 LAB — AMYLASE: Amylase: 37 U/L (ref 31–124)

## 2017-08-18 ENCOUNTER — Encounter: Payer: Self-pay | Admitting: Physician Assistant

## 2017-08-18 NOTE — Assessment & Plan Note (Signed)
Request notes from Dr. Forde Dandy to update our record.

## 2017-08-18 NOTE — Assessment & Plan Note (Signed)
This was thought due to heavy menses. She has since had a hysterectomy. As iron supplement may contribute to constipation, plan to d/c this with anticipated normal CBC.

## 2017-08-18 NOTE — Assessment & Plan Note (Signed)
Await labs. Anticipate recommending follow-up with GI.

## 2017-08-18 NOTE — Assessment & Plan Note (Signed)
Verify euthyroid with records for endocrinology. Hope to stop iron supplement. Colonoscopy 2017. Anticipate need for re-evaluation with GI.

## 2017-08-18 NOTE — Assessment & Plan Note (Signed)
Patient has known chronic pancreatitis, but isn't seeing GI regularly and doesn't take anything for this. Constipation likely contributes. Await notes from endocrinology to verify euthyroid. Plan to stop iron supplement, presuming hgb is normal.

## 2017-08-18 NOTE — Assessment & Plan Note (Signed)
Now exacerbated by significant financial and transportation strain. I support her application for disability. Reminded her that her family wants to help her, too.

## 2017-08-21 DIAGNOSIS — M5442 Lumbago with sciatica, left side: Secondary | ICD-10-CM | POA: Diagnosis not present

## 2017-08-21 DIAGNOSIS — M542 Cervicalgia: Secondary | ICD-10-CM | POA: Diagnosis not present

## 2017-08-21 DIAGNOSIS — G8929 Other chronic pain: Secondary | ICD-10-CM | POA: Diagnosis not present

## 2017-08-22 DIAGNOSIS — G4733 Obstructive sleep apnea (adult) (pediatric): Secondary | ICD-10-CM | POA: Diagnosis not present

## 2017-09-03 ENCOUNTER — Encounter: Payer: Self-pay | Admitting: Physician Assistant

## 2017-09-04 MED ORDER — ALBUTEROL SULFATE HFA 108 (90 BASE) MCG/ACT IN AERS: 2.0000 | INHALATION_SPRAY | RESPIRATORY_TRACT | 1 refills | Status: DC | PRN

## 2017-09-04 NOTE — Telephone Encounter (Signed)
Meds ordered this encounter  Medications  . albuterol (PROVENTIL HFA;VENTOLIN HFA) 108 (90 Base) MCG/ACT inhaler    Sig: Inhale 2 puffs into the lungs every 4 (four) hours as needed for wheezing or shortness of breath (cough, chest tightness). Reported on 01/04/2016    Dispense:  18 g    Refill:  1    Order Specific Question:   Supervising Provider    Answer:   Brigitte Pulse, EVA N [[id number]]

## 2017-09-06 ENCOUNTER — Other Ambulatory Visit: Payer: Self-pay | Admitting: Physician Assistant

## 2017-09-06 MED ORDER — ALBUTEROL SULFATE HFA 108 (90 BASE) MCG/ACT IN AERS: 2.0000 | INHALATION_SPRAY | RESPIRATORY_TRACT | 1 refills | Status: DC | PRN

## 2017-09-06 MED ORDER — ALBUTEROL SULFATE HFA 108 (90 BASE) MCG/ACT IN AERS: 2.0000 | INHALATION_SPRAY | RESPIRATORY_TRACT | 1 refills | Status: AC | PRN

## 2017-09-06 NOTE — Addendum Note (Signed)
Addended by: Fara Chute on: 09/06/2017 02:35 PM   Modules accepted: Orders

## 2017-09-06 NOTE — Progress Notes (Addendum)
Rx signed 12/19 printed. Sig too long.   Meds ordered this encounter  Medications  . DISCONTD: albuterol (PROVENTIL HFA;VENTOLIN HFA) 108 (90 Base) MCG/ACT inhaler    Sig: Inhale 2 puffs into the lungs every 4 (four) hours as needed for wheezing or shortness of breath (cough, chest tightness). Reported on 01/04/2016    Dispense:  18 g    Refill:  1    Order Specific Question:   Supervising Provider    Answer:   SHAW, EVA N [[id number]]  . albuterol (PROVENTIL HFA;VENTOLIN HFA) 108 (90 Base) MCG/ACT inhaler    Sig: Inhale 2 puffs into the lungs every 4 (four) hours as needed for wheezing or shortness of breath (cough, chest tightness).    Dispense:  18 g    Refill:  1    Order Specific Question:   Supervising Provider    Answer:   Brigitte Pulse, EVA N [[id number]]

## 2017-09-12 ENCOUNTER — Other Ambulatory Visit: Payer: Self-pay | Admitting: Physician Assistant

## 2017-09-12 DIAGNOSIS — M7712 Lateral epicondylitis, left elbow: Secondary | ICD-10-CM

## 2017-09-12 DIAGNOSIS — M791 Myalgia, unspecified site: Secondary | ICD-10-CM

## 2017-09-12 NOTE — Telephone Encounter (Signed)
Is refill appropriate  

## 2017-09-12 NOTE — Telephone Encounter (Signed)
Emily Mathis's pt.   Does she want this refilled?   Last OV where this was addressed  is 09/06/16.  Thanks

## 2017-09-13 ENCOUNTER — Encounter: Payer: Self-pay | Admitting: Physician Assistant

## 2017-09-13 ENCOUNTER — Ambulatory Visit: Payer: BLUE CROSS/BLUE SHIELD | Admitting: Physician Assistant

## 2017-09-13 VITALS — BP 134/86 | HR 108 | Temp 98.1°F | Resp 18 | Ht 63.0 in | Wt 275.5 lb

## 2017-09-13 DIAGNOSIS — M5136 Other intervertebral disc degeneration, lumbar region: Secondary | ICD-10-CM | POA: Insufficient documentation

## 2017-09-13 DIAGNOSIS — E890 Postprocedural hypothyroidism: Secondary | ICD-10-CM

## 2017-09-13 DIAGNOSIS — F430 Acute stress reaction: Secondary | ICD-10-CM | POA: Diagnosis not present

## 2017-09-13 DIAGNOSIS — M51369 Other intervertebral disc degeneration, lumbar region without mention of lumbar back pain or lower extremity pain: Secondary | ICD-10-CM | POA: Insufficient documentation

## 2017-09-13 DIAGNOSIS — M1710 Unilateral primary osteoarthritis, unspecified knee: Secondary | ICD-10-CM | POA: Insufficient documentation

## 2017-09-13 DIAGNOSIS — E119 Type 2 diabetes mellitus without complications: Secondary | ICD-10-CM

## 2017-09-13 DIAGNOSIS — K5900 Constipation, unspecified: Secondary | ICD-10-CM

## 2017-09-13 DIAGNOSIS — Z6841 Body Mass Index (BMI) 40.0 and over, adult: Secondary | ICD-10-CM

## 2017-09-13 DIAGNOSIS — K861 Other chronic pancreatitis: Secondary | ICD-10-CM | POA: Diagnosis not present

## 2017-09-13 DIAGNOSIS — M179 Osteoarthritis of knee, unspecified: Secondary | ICD-10-CM | POA: Insufficient documentation

## 2017-09-13 DIAGNOSIS — M5030 Other cervical disc degeneration, unspecified cervical region: Secondary | ICD-10-CM | POA: Insufficient documentation

## 2017-09-13 DIAGNOSIS — E042 Nontoxic multinodular goiter: Secondary | ICD-10-CM | POA: Diagnosis not present

## 2017-09-13 NOTE — Telephone Encounter (Signed)
Please advise 

## 2017-09-13 NOTE — Telephone Encounter (Signed)
Request for medication not on med list

## 2017-09-13 NOTE — Progress Notes (Signed)
Patient ID: Emily Mathis, female    DOB: Jul 02, 1966, 51 y.o.   MRN: [id number]  PCP: Porfirio Oar, PA-C  Chief Complaint  Patient presents with  . Mood    Depression scale score 12  . Abdominal Pain    Pt states pain is doing better. Pt states it is still hard to go to the bathroom.  . Follow-up    Subjective:   Presents for evaluation of abdominal pain and depression.  At her last visit, one month ago, we updated labs, due to her history of chronic pancreatitis, but really thought her pain was due to constipation. She is s/p thyroidectomy and relates that her last TSH was quite elevated, but that no medication dosage adjustment was recommended. She was advised to use OTC Miralax twice daily, every day, and to contact her endocrinologist regarding the levothyroxine dose.  Her depression was significantly worse, and financial strain was a problem. We discussed ways to manage both the situation and her mood. She intended to begin the application for disability, and made no medication changes.  Uses Miralax 2 times daily, "I think."  Leg swelling. Dry skin. Fatigue. Weight gain. Left her wrist splints at her sister's in [place]. Has developed recurrent tingling and throbbing in her fingers, hands and wrists. Asks for another set. Has not asked her sister to mail them to her.  Episodes of chest pain and tightness, associated with cough. Chronic. Resolves with albuterol inhaler. Often triggered by anxiety symptoms.  Last visit with endocrinology was 06/12/2017.  Reports that she has not been contacted with her most recent labs, but that she was able to see online that the TSH was 80, and A1C was 6.9%. Sees a nutritionist 09/20/2016. Next visit in January 2019.  Has had knee and back injections for osteoarthritis. Using CPAP now, and has follow-up in January.     Review of Systems As above  Depression screen [facility] 2/9 09/13/2017 08/15/2017 02/05/2017 11/19/2016 11/12/2016    Decreased Interest 2 2 0 0 0  Down, Depressed, Hopeless 2 2 0 0 0  PHQ - 2 Score 4 4 0 0 0  Altered sleeping 2 1 - - -  Tired, decreased energy 3 3 - - -  Change in appetite 0 0 - - -  Feeling bad or failure about yourself  2 2 - - -  Trouble concentrating 0 0 - - -  Moving slowly or fidgety/restless 1 0 - - -  Suicidal thoughts 0 0 - - -  PHQ-9 Score 12 10 - - -  Difficult doing work/chores Somewhat difficult Somewhat difficult - - -      Patient Active Problem List   Diagnosis Date Noted  . Abdominal pain   . Chronic pancreatitis (HCC)   . Constipation   . Status post laparoscopic hysterectomy 06/11/2016  . Myalgia 02/15/2016  . Upper airway cough syndrome 10/21/2015  . Polyarthralgia 06/16/2015  . Diabetes mellitus type 2, uncomplicated (HCC) 09/14/2013  . Keloid scar, cervical incision 09/02/2013  . Reaction, situational, acute, to stress 07/02/2013  . 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 BMI 45.0-49.9, adult (HCC) 03/27/2013  . GERD (gastroesophageal reflux disease) 02/15/2013  . Hypocalcemia 01/07/2013  . Chest pain on exertion 07/24/2011     Prior to Admission medications   Medication Sig Start Date End Date Taking? Authorizing Provider  albuterol (PROVENTIL HFA;VENTOLIN HFA) 108 (90 Base) MCG/ACT inhaler Inhale 2 puffs into the lungs every 4 (four) hours as needed for wheezing  or shortness of breath (cough, chest tightness). 09/06/17  Yes Amadeus Oyama, PA-C  ALPRAZolam (XANAX) 0.5 MG tablet Take 1 tablet (0.5 mg total) by mouth at bedtime as needed. for sleep 08/15/17  Yes Izora Benn, PA-C  atorvastatin (LIPITOR) 20 MG tablet Take 1 tablet (20 mg total) by mouth daily. 11/13/16  Yes Corlene Sabia, PA-C  Canagliflozin-Metformin HCl (INVOKAMET) 150-500 MG TABS Take 1 tablet by mouth daily. 03/06/17  Yes Alann Avey, PA-C  celecoxib (CELEBREX) 200 MG capsule Take 1 capsule (200 mg total) by mouth 2 (two) times daily. 09/06/16  Yes Gardy Montanari,  PA-C  chlorthalidone (HYGROTON) 25 MG tablet TAKE 1 TABLET(25 MG) BY MOUTH DAILY 09/05/16  Yes Porfirio Oar, PA-C  Cholecalciferol (VITAMIN D3) 50000 units CAPS Take 50,000 Units by mouth every Monday.  06/14/16  Yes [provider]  cyclobenzaprine (FLEXERIL) 10 MG tablet Take 1 tablet (10 mg total) by mouth 3 (three) times daily as needed for muscle spasms. 11/12/16  Yes Jayton Popelka, PA-C  diazepam (VALIUM) 5 MG tablet Take 1 tablet (5 mg total) by mouth daily as needed. 08/15/17  Yes Alene Bergerson, PA-C  docusate sodium (COLACE) 250 MG capsule Take 1 capsule (250 mg total) by mouth daily. 09/06/16  Yes Morene Cecilio, PA-C  ferrous sulfate 325 (65 FE) MG tablet Take 325 mg by mouth 3 (three) times daily with meals.   Yes [provider]  glycerin adult 2 g suppository Place 1 suppository rectally as needed for constipation.   Yes [provider]  levothyroxine (SYNTHROID, LEVOTHROID) 300 MCG tablet Take 1 tablet (300 mcg total) by mouth daily. 06/17/15  Yes Chelbi Herber, PA-C  lisinopril (PRINIVIL,ZESTRIL) 5 MG tablet Take 1 tablet (5 mg total) by mouth daily. 11/12/16  Yes Jaslynne Dahan, PA-C  nystatin (MYCOSTATIN) powder Apply topically 4 (four) times daily. Patient taking differently: Apply topically 4 (four) times daily as needed (For heat rash.).  10/02/15  Yes Juanmanuel Marohl, PA-C  polyethylene glycol (MIRALAX / GLYCOLAX) packet Take 17 g by mouth 2 (two) times daily. Take 2 packets today (day one), wait 2 hours, if no stool produced then take 2 more packets on day one; then continue taking 2 packets daily until you achieve daily soft stools; if water stool develops then cut back to 1 packet daily. 07/24/16  Yes Street, Crane, PA-C  potassium chloride SA (K-DUR,KLOR-CON) 20 MEQ tablet Take 1 tablet (20 mEq total) by mouth 2 (two) times daily. 06/12/16  Yes Patton Salles, MD  ranitidine (ZANTAC) 150 MG tablet Take 1 tablet (150 mg total) by  mouth 2 (two) times daily. 02/05/17  Yes Lillionna Nabi, PA-C  senna (SENOKOT) 8.6 MG TABS tablet Take 1 tablet (8.6 mg total) by mouth daily as needed for mild constipation. 08/22/16  Yes Mikell, Antionette Poles, MD  sertraline (ZOLOFT) 100 MG tablet Take 1 tablet (100 mg total) by mouth daily. 08/15/17  Yes Unknown Flannigan, PA-C  sucralfate (CARAFATE) 1 G tablet Take 1 tablet (1 g total) by mouth 4 (four) times daily. 04/19/15  Yes Marisa Severin, MD     Allergies  Allergen Reactions  . Dilaudid [Hydromorphone Hcl] Itching  . Hydrocodone Itching  . Latex Itching and Other (See Comments)    leaves marks on skin       Objective:  Physical Exam  Constitutional: She is oriented to person, place, and time. She appears well-developed and well-nourished. She is active and cooperative. No distress.  BP 134/86 (BP Location:  Left Arm, Patient Position: Sitting, Cuff Size: Large)   Pulse (!) 108   Temp 98.1 F (36.7 C) (Oral)   Resp 18   Ht 5\' 3"  (1.6 m)   Wt 275 lb 8 oz (125 kg)   LMP 05/08/2016 (Exact Date)   SpO2 96%   BMI 48.80 kg/m   HENT:  Head: Normocephalic and atraumatic.  Right Ear: Hearing normal.  Left Ear: Hearing normal.  Eyes: Conjunctivae are normal. No scleral icterus.  Neck: Normal range of motion. Neck supple. No thyromegaly present.  Cardiovascular: Normal rate, regular rhythm and normal heart sounds.  Pulses:      Radial pulses are 2+ on the right side, and 2+ on the left side.  Pulmonary/Chest: Effort normal and breath sounds normal.  Lymphadenopathy:       Head (right side): No tonsillar, no preauricular, no posterior auricular and no occipital adenopathy present.       Head (left side): No tonsillar, no preauricular, no posterior auricular and no occipital adenopathy present.    She has no cervical adenopathy.       Right: No supraclavicular adenopathy present.       Left: No supraclavicular adenopathy present.  Neurological: She is alert and oriented to person,  place, and time. No sensory deficit.  Skin: Skin is warm, dry and intact. No rash noted. No cyanosis or erythema. Nails show no clubbing.  Psychiatric: Her speech is normal and behavior is normal. Judgment and thought content normal. Her mood appears not anxious. Her affect is labile. Her affect is not angry, not blunt and not inappropriate. Cognition and memory are normal. She exhibits a depressed mood.       Wt Readings from Last 3 Encounters:  09/13/17 275 lb 8 oz (125 kg)  08/15/17 268 lb 6.4 oz (121.7 kg)  04/04/17 267 lb (121.1 kg)       Assessment & Plan:   Problem List Items Addressed This Visit    Diabetes mellitus type 2, uncomplicated (HCC) (Chronic)    Reasonable control. Weight loss will help. Hope to see her able to make more healthy eating choices once she meets with nutritionist.      BMI 45.0-49.9, adult [facility])    Nutrition education expected to help. Exercise is limited due to RA. Encouraged her to explore options for water exercise.      Hypothyroidism, postsurgical    Undertreated. Stressed the importance of contacting her endocrinologist for anticipated dose change. I suspect that the constipation and mood will improve significantly with dose adjustment.      Reaction, situational, acute, to stress - Primary   Chronic pancreatitis (HCC)    No evidence of active pancreatitis on recent labs.      Relevant Medications   traMADol (ULTRAM) 50 MG tablet   Constipation    Improved with increased Miralax, but persists. Undertreated hypothyroidism likely a major contributor. INCREASE Miralax. Contact endocrinology regarding levothyroxine dose change.          Return in about 2 months (around 11/14/2017) for re-evaluation of mood, constipation, etc.   Fernande Bras, PA-C [facility] at [facility]

## 2017-09-13 NOTE — Patient Instructions (Addendum)
1. INCREASE the Miralax to THREE times each day, OR take a double dose twice each day.  2. Contact Dr. Baldwin Crown office. The constipation and depression and continued weight gain may be, at least in part, due tot he elevated TSH.  3. Please contact an eye specialist to schedule an exam. [facility]  [address] [place], [place] [address]  Phone: [phone]  [facility] [place], [place], [place] [address]  Phone: [phone]    IF you received an x-ray today, you will receive an invoice from [facility]. Please contact [facility] at [phone] with questions or concerns regarding your invoice.   IF you received labwork today, you will receive an invoice from [facility]. Please contact [facility] at [phone] with questions or concerns regarding your invoice.   Our billing staff will not be able to assist you with questions regarding bills from these companies.  You will be contacted with the lab results as soon as they are available. The fastest way to get your results is to activate your My Chart account. Instructions are located on the last page of this paperwork. If you have not heard from [place] regarding the results in 2 weeks, please contact this office.

## 2017-09-14 NOTE — Telephone Encounter (Signed)
This should really be authorized by her orthopedic or rheumatologic specialist, but I am happy to do it.  Meds ordered this encounter  Medications  . celecoxib (CELEBREX) 200 MG capsule    Sig: TAKE 1 CAPSULE(200 MG) BY MOUTH TWICE DAILY    Dispense:  60 capsule    Refill:  0

## 2017-09-14 NOTE — Telephone Encounter (Signed)
This really should be authorized by the patient's endocrinologist.  Meds ordered this encounter  Medications  . INVOKAMET 150-500 MG TABS    Sig: TAKE 1 TABLET BY MOUTH DAILY    Dispense:  30 tablet    Refill:  0

## 2017-09-15 NOTE — Assessment & Plan Note (Signed)
Improved with increased Miralax, but persists. Undertreated hypothyroidism likely a major contributor. INCREASE Miralax. Contact endocrinology regarding levothyroxine dose change.

## 2017-09-15 NOTE — Assessment & Plan Note (Signed)
Undertreated. Stressed the importance of contacting her endocrinologist for anticipated dose change. I suspect that the constipation and mood will improve significantly with dose adjustment.

## 2017-09-15 NOTE — Assessment & Plan Note (Signed)
No evidence of active pancreatitis on recent labs.

## 2017-09-15 NOTE — Assessment & Plan Note (Signed)
Reasonable control. Weight loss will help. Hope to see her able to make more healthy eating choices once she meets with nutritionist.

## 2017-09-15 NOTE — Assessment & Plan Note (Signed)
Nutrition education expected to help. Exercise is limited due to RA. Encouraged her to explore options for water exercise.

## 2017-09-19 ENCOUNTER — Ambulatory Visit: Payer: BLUE CROSS/BLUE SHIELD | Admitting: Registered"

## 2017-09-20 ENCOUNTER — Ambulatory Visit: Payer: BLUE CROSS/BLUE SHIELD | Admitting: Registered"

## 2017-09-22 DIAGNOSIS — G4733 Obstructive sleep apnea (adult) (pediatric): Secondary | ICD-10-CM | POA: Diagnosis not present

## 2017-10-05 ENCOUNTER — Emergency Department (HOSPITAL_COMMUNITY)
Admission: EM | Admit: 2017-10-05 | Discharge: 2017-10-06 | Disposition: A | Discharge status: Home / Self Care | Payer: BLUE CROSS/BLUE SHIELD | Attending: Emergency Medicine | Admitting: Emergency Medicine

## 2017-10-05 ENCOUNTER — Encounter (HOSPITAL_COMMUNITY): Payer: Self-pay | Admitting: *Deleted

## 2017-10-05 ENCOUNTER — Emergency Department (HOSPITAL_COMMUNITY): Payer: BLUE CROSS/BLUE SHIELD

## 2017-10-05 ENCOUNTER — Other Ambulatory Visit: Payer: Self-pay

## 2017-10-05 DIAGNOSIS — I10 Essential (primary) hypertension: Secondary | ICD-10-CM | POA: Diagnosis not present

## 2017-10-05 DIAGNOSIS — J45909 Unspecified asthma, uncomplicated: Secondary | ICD-10-CM | POA: Diagnosis not present

## 2017-10-05 DIAGNOSIS — Z79899 Other long term (current) drug therapy: Secondary | ICD-10-CM | POA: Insufficient documentation

## 2017-10-05 DIAGNOSIS — R1010 Upper abdominal pain, unspecified: Secondary | ICD-10-CM

## 2017-10-05 DIAGNOSIS — R1013 Epigastric pain: Secondary | ICD-10-CM | POA: Diagnosis not present

## 2017-10-05 DIAGNOSIS — R0602 Shortness of breath: Secondary | ICD-10-CM | POA: Diagnosis not present

## 2017-10-05 DIAGNOSIS — Z91040 Latex allergy status: Secondary | ICD-10-CM | POA: Insufficient documentation

## 2017-10-05 DIAGNOSIS — Z8543 Personal history of malignant neoplasm of ovary: Secondary | ICD-10-CM | POA: Insufficient documentation

## 2017-10-05 DIAGNOSIS — E119 Type 2 diabetes mellitus without complications: Secondary | ICD-10-CM | POA: Insufficient documentation

## 2017-10-05 DIAGNOSIS — R1011 Right upper quadrant pain: Secondary | ICD-10-CM | POA: Diagnosis not present

## 2017-10-05 DIAGNOSIS — E039 Hypothyroidism, unspecified: Secondary | ICD-10-CM | POA: Diagnosis not present

## 2017-10-05 LAB — LIPASE, BLOOD: LIPASE: 29 U/L (ref 11–51)

## 2017-10-05 LAB — URINALYSIS, ROUTINE W REFLEX MICROSCOPIC
Bilirubin Urine: NEGATIVE
GLUCOSE, UA: 150 mg/dL — AB
Hgb urine dipstick: NEGATIVE
Ketones, ur: NEGATIVE mg/dL
Leukocytes, UA: NEGATIVE
Nitrite: NEGATIVE
PH: 6 (ref 5.0–8.0)
Protein, ur: NEGATIVE mg/dL
SPECIFIC GRAVITY, URINE: 1.024 (ref 1.005–1.030)

## 2017-10-05 LAB — CBC
HEMATOCRIT: 37.7 % (ref 36.0–46.0)
HEMOGLOBIN: 12.1 g/dL (ref 12.0–15.0)
MCH: 29.7 pg (ref 26.0–34.0)
MCHC: 32.1 g/dL (ref 30.0–36.0)
MCV: 92.4 fL (ref 78.0–100.0)
PLATELETS: 442 10*3/uL — AB (ref 150–400)
RBC: 4.08 MIL/uL (ref 3.87–5.11)
RDW: 14.4 % (ref 11.5–15.5)
WBC: 10.4 10*3/uL (ref 4.0–10.5)

## 2017-10-05 LAB — COMPREHENSIVE METABOLIC PANEL
ALT: 20 U/L (ref 14–54)
AST: 24 U/L (ref 15–41)
Albumin: 3.5 g/dL (ref 3.5–5.0)
Alkaline Phosphatase: 82 U/L (ref 38–126)
Anion gap: 11 (ref 5–15)
BUN: 14 mg/dL (ref 6–20)
CHLORIDE: 101 mmol/L (ref 101–111)
CO2: 25 mmol/L (ref 22–32)
CREATININE: 0.72 mg/dL (ref 0.44–1.00)
Calcium: 8.1 mg/dL — ABNORMAL LOW (ref 8.9–10.3)
GFR calc Af Amer: >60 mL/min (ref >60)
GFR calc non Af Amer: >60 mL/min (ref >60)
Glucose, Bld: 210 mg/dL — ABNORMAL HIGH (ref 65–99)
POTASSIUM: 3.6 mmol/L (ref 3.5–5.1)
SODIUM: 137 mmol/L (ref 135–145)
Total Bilirubin: 0.4 mg/dL (ref 0.3–1.2)
Total Protein: 7.4 g/dL (ref 6.5–8.1)

## 2017-10-05 LAB — I-STAT BETA HCG BLOOD, ED (MC, WL, AP ONLY): I-stat hCG, quantitative: <5 m[IU]/mL (ref <5)

## 2017-10-05 MED ORDER — PROMETHAZINE HCL 25 MG/ML IJ SOLN: 12.5000 mg | Freq: Once | INTRAMUSCULAR | Status: DC

## 2017-10-05 MED ORDER — IOPAMIDOL (ISOVUE-300) INJECTION 61%
INTRAVENOUS | Status: AC
  Administered 2017-10-05: 100 mL
  Filled 2017-10-05: qty 100

## 2017-10-05 MED ORDER — MORPHINE SULFATE (PF) 4 MG/ML IV SOLN
2.0000 mg | Freq: Once | INTRAVENOUS | Status: AC
  Administered 2017-10-05: 2 mg via INTRAVENOUS
  Filled 2017-10-05: qty 1

## 2017-10-05 NOTE — ED Notes (Signed)
Patient transported to CT 

## 2017-10-05 NOTE — ED Triage Notes (Signed)
Pt c/o upper abd pain with bloating since last night, hx of pancreatitis, tender to RUQ and epigastric. Also c/o sob x 2 weeks. Has been taking medications at home for constipation without relief

## 2017-10-05 NOTE — ED Provider Notes (Signed)
[facility] EMERGENCY DEPARTMENT Provider Note   CSN: [id number] Arrival date & time: 10/05/17  1929     History   Chief Complaint Chief Complaint  Patient presents with  . Abdominal Pain    HPI Emily Mathis is a 52 y.o. female.  HPI  52 year old female with epigastric pain that began yesterday.  Pain has been waxing and waning.  She has had some nausea but has not been vomiting and has been tolerating fluids without difficulty.  She took ibuprofen at home without relief.  She denies any fever or chills, UTI symptoms, diarrhea, fever, or chills.  She describes this is similar to her previous episode of pancreatitis.  She does drink alcohol occasionally but has not any alcohol since New Year's Eve.  Past Medical History:  Diagnosis Date  . Abnormal Pap smear of cervix 1990   Hx cryotherapy to cervix  . Abnormal uterine bleeding   . Anemia 02/20/16   Transfusion of 1 unit pRBCs  . Anxiety   . Anxiety and depression   . Arthritis   . Asthma   . 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 Chest pain    per pt due to anemia-   . Diabetes mellitus without complication ([place])   . Fibroid   . Generalized pain   . GERD (gastroesophageal reflux disease)   . Headache(784.0)   . History of blood transfusion 02/20/2016  . Hyperlipidemia   . Hypertension    per Dr. Irven Shelling note 08/30/2011  . Hypothyroidism   . Iron deficiency anemia 02/15/2016  . Leg swelling    bilateral  . Mild scoliosis 08/06/2017  . Neuromuscular disorder ([place])   . Shortness of breath dyspnea    on exertion due to anemia  . Thyroid disease    Hx 3 goiters--hx thyroidectomy--sees Dr. Forde Dandy    Patient Active Problem List   Diagnosis Date Noted  . DJD (degenerative joint disease) of knee 09/13/2017  . DDD (degenerative disc disease), lumbar 09/13/2017  . DDD (degenerative disc disease), cervical 09/13/2017  . Abdominal pain   . Chronic pancreatitis ([place])   . Constipation   . Status post  laparoscopic hysterectomy 06/11/2016  . Myalgia 02/15/2016  . Upper airway cough syndrome 10/21/2015  . Polyarthralgia 06/16/2015  . Diabetes mellitus type 2, uncomplicated ([place]) 40/97/3532  . Keloid scar, cervical incision 09/02/2013  . Reaction, situational, acute, to stress 07/02/2013  . 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 BMI 45.0-49.9, adult ([place]) 03/27/2013  . GERD (gastroesophageal reflux disease) 02/15/2013  . Hypocalcemia 01/07/2013  . Chest pain on exertion 07/24/2011    Past Surgical History:  Procedure Laterality Date  . CARDIAC CATHETERIZATION  2013  . CESAREAN SECTION  1995  . CESAREAN SECTION  1996  . COLONOSCOPY    . CYSTOSCOPY N/A 06/11/2016   Procedure: CYSTOSCOPY;  Surgeon: [name], MD;  Location: [facility];  Service: Gynecology;  Laterality: N/A;  . HYSTEROSCOPY W/D&C N/A 04/10/2016   Procedure: HYSTEROSCOPY WITH fractional dilatation and curretage;  Surgeon: [name], MD;  Location: [facility];  Service: Gynecology;  Laterality: N/A;  . LEFT HEART CATHETERIZATION WITH CORONARY ANGIOGRAM  07/24/2011   Procedure: LEFT HEART CATHETERIZATION WITH CORONARY ANGIOGRAM;  Surgeon: Laverda Page, MD;  Location: [facility] CATH LAB;  Service: Cardiovascular;;  . ROBOTIC ASSISTED TOTAL HYSTERECTOMY WITH SALPINGECTOMY Bilateral 06/11/2016   Procedure: ROBOTIC ASSISTED TOTAL HYSTERECTOMY WITH SALPINGECTOMY;  Surgeon: [name], MD;  Location:  [facility];  Service: Gynecology;  Laterality: Bilateral;  . THYROIDECTOMY N/A 12/18/2012   Procedure: TOTAL THYROIDECTOMY;  Surgeon: Earnstine Regal, MD;  Location: [facility];  Service: General;  Laterality: N/A;  . UPPER GI ENDOSCOPY      OB History    Gravida Para Term Preterm AB Living   3 3 3     3    SAB TAB Ectopic Multiple Live Births                   Home Medications    Prior to Admission medications   Medication Sig Start Date End Date Taking? Authorizing Provider  albuterol (PROVENTIL  HFA;VENTOLIN HFA) 108 (90 Base) MCG/ACT inhaler Inhale 2 puffs into the lungs every 4 (four) hours as needed for wheezing or shortness of breath (cough, chest tightness). 09/06/17  Yes Jeffery, [name], PA-C  ALPRAZolam (XANAX) 0.5 MG tablet Take 1 tablet (0.5 mg total) by mouth at bedtime as needed. for sleep 08/15/17  Yes Jeffery, [name], PA-C  atorvastatin (LIPITOR) 20 MG tablet Take 1 tablet (20 mg total) by mouth daily. 11/13/16  Yes Jeffery, [name], PA-C  celecoxib (CELEBREX) 200 MG capsule TAKE 1 CAPSULE(200 MG) BY MOUTH TWICE DAILY 09/14/17  Yes Jeffery, [name], PA-C  chlorthalidone (HYGROTON) 25 MG tablet TAKE 1 TABLET(25 MG) BY MOUTH DAILY 09/05/16  Yes Jeffery, [name], PA-C  Cholecalciferol (VITAMIN D3) 50000 units CAPS Take 50,000 Units by mouth every Monday.  06/14/16  Yes [provider]  cyclobenzaprine (FLEXERIL) 10 MG tablet Take 1 tablet (10 mg total) by mouth 3 (three) times daily as needed for muscle spasms. 11/12/16  Yes Jeffery, [name], PA-C  diazepam (VALIUM) 5 MG tablet Take 1 tablet (5 mg total) by mouth daily as needed. Patient taking differently: Take 5 mg by mouth daily as needed for anxiety.  08/15/17  Yes Jeffery, [name], PA-C  diclofenac (VOLTAREN) 75 MG EC tablet Take 1 tablet twice daily as needed for pain. 08/15/17  Yes [provider]  docusate sodium (COLACE) 250 MG capsule Take 1 capsule (250 mg total) by mouth daily. Patient taking differently: Take 250 mg by mouth every other day.  09/06/16  Yes Jeffery, [name], PA-C  ferrous sulfate 325 (65 FE) MG tablet Take 325 mg by mouth 3 (three) times daily with meals.   Yes [provider]  glycerin adult 2 g suppository Place 1 suppository rectally as needed for constipation.   Yes [provider]  INVOKAMET 150-500 MG TABS TAKE 1 TABLET BY MOUTH DAILY 09/14/17  Yes Jeffery, [name], PA-C  levothyroxine (SYNTHROID, LEVOTHROID) 300 MCG tablet Take 1 tablet (300 mcg total) by mouth daily.  06/17/15  Yes Jeffery, [name], PA-C  lisinopril (PRINIVIL,ZESTRIL) 5 MG tablet Take 1 tablet (5 mg total) by mouth daily. 11/12/16  Yes Jeffery, [name], PA-C  nystatin (MYCOSTATIN) powder Apply topically 4 (four) times daily. Patient taking differently: Apply topically 4 (four) times daily as needed (For heat rash.).  10/02/15  Yes Jeffery, [name], PA-C  polyethylene glycol (MIRALAX / GLYCOLAX) packet Take 17 g by mouth 2 (two) times daily. Take 2 packets today (day one), wait 2 hours, if no stool produced then take 2 more packets on day one; then continue taking 2 packets daily until you achieve daily soft stools; if water stool develops then cut back to 1 packet daily. Patient taking differently: Take 17 g by mouth 3 (three) times daily.  07/24/16  Yes Street, [place], PA-C  potassium chloride SA (K-DUR,KLOR-CON) 20  MEQ tablet Take 1 tablet (20 mEq total) by mouth 2 (two) times daily. 06/12/16  Yes [name], MD  ranitidine (ZANTAC) 150 MG tablet Take 1 tablet (150 mg total) by mouth 2 (two) times daily. 02/05/17  Yes Jeffery, [name], PA-C  senna (SENOKOT) 8.6 MG TABS tablet Take 1 tablet (8.6 mg total) by mouth daily as needed for mild constipation. 08/22/16  Yes Mikell, Jeani Sow, MD  sertraline (ZOLOFT) 100 MG tablet Take 1 tablet (100 mg total) by mouth daily. 08/15/17  Yes Jeffery, [name], PA-C  sucralfate (CARAFATE) 1 G tablet Take 1 tablet (1 g total) by mouth 4 (four) times daily. 04/19/15  Yes Linton Flemings, MD    Family History Family History  Problem Relation Age of Onset  . Heart disease Mother   . Stroke Mother   . Hypertension Mother   . Heart disease Father   . Colon cancer Father   . Hypertension Father   . Diabetes Father   . Cancer Paternal Grandmother   . Asthma Daughter   . Seizures Daughter   . Hypertension Sister   . Thyroid disease Sister   . Mental illness Daughter 5       suicide attempt 06/2013  . Asthma Sister   . Hypertension Sister   . Breast  cancer Paternal Aunt   . Cancer Paternal Aunt     Social History Social History   Tobacco Use  . Smoking status: Never Smoker  . Smokeless tobacco: Never Used  Substance Use Topics  . Alcohol use: Yes    Alcohol/week: 0.0 oz    Frequency: Never    Comment: Occasional glass of wine  . Drug use: No     Allergies   Dilaudid [hydromorphone hcl]; Hydrocodone; and Latex   Review of Systems Review of Systems  All other systems reviewed and are negative.    Physical Exam Updated Vital Signs BP (!) 117/59   Pulse 96   Temp 97.9 F (36.6 C) (Oral)   Resp (!) 24   LMP 05/08/2016 (Exact Date)   SpO2 100%   Physical Exam  Constitutional: She is oriented to person, place, and time. She appears well-developed and well-nourished.  HENT:  Head: Normocephalic and atraumatic.  Mouth/Throat: Oropharynx is clear and moist.  Eyes: EOM are normal. Pupils are equal, round, and reactive to light.  Cardiovascular: Normal rate, regular rhythm, normal heart sounds and intact distal pulses.  Pulmonary/Chest: Effort normal and breath sounds normal.  Abdominal: Soft. Normal appearance and normal aorta. Bowel sounds are absent. There is tenderness in the right upper quadrant, epigastric area and left upper quadrant.  Pain in the epigastric and bilateral upper quadrants.  There is no rebound, distention, or rigidity  Neurological: She is alert and oriented to person, place, and time.  Skin: Skin is warm and dry. Capillary refill takes less than 2 seconds.  Psychiatric: She has a normal mood and affect.  Nursing note and vitals reviewed.    ED Treatments / Results  Labs (all labs ordered are listed, but only abnormal results are displayed) Labs Reviewed  COMPREHENSIVE METABOLIC PANEL - Abnormal; Notable for the following components:      Result Value   Glucose, Bld 210 (*)    Calcium 8.1 (*)    All other components within normal limits  CBC - Abnormal; Notable for the following  components:   Platelets 442 (*)    All other components within normal limits  URINALYSIS, ROUTINE W  REFLEX MICROSCOPIC - Abnormal; Notable for the following components:   Glucose, UA 150 (*)    All other components within normal limits  LIPASE, BLOOD  I-STAT BETA HCG BLOOD, ED ([facility], [facility], [facility] ONLY)    EKG  EKG Interpretation None       Radiology Dg Chest 2 View  Result Date: 10/05/2017 CLINICAL DATA:  Lower abdominal pain and shortness of breath. EXAM: CHEST  2 VIEW COMPARISON:  Abdominal series 07/27/2016 FINDINGS: Cardiomediastinal silhouette is normal. Mediastinal contours appear intact. There is no evidence of focal airspace consolidation, pleural effusion or pneumothorax. Osseous structures are without acute abnormality. Postsurgical changes in the neck. IMPRESSION: No active cardiopulmonary disease. Electronically Signed   By: Fidela Salisbury M.D.   On: 10/05/2017 20:17    Procedures Procedures (including critical care time)  Medications Ordered in ED Medications  morphine 4 MG/ML injection 2 mg (2 mg Intravenous Given 10/05/17 2300)  iopamidol (ISOVUE-300) 61 % injection (100 mLs  Contrast Given 10/05/17 2335)     Initial Impression / Assessment and Plan / ED Course  I have reviewed the triage vital signs and the nursing notes.  Pertinent labs & imaging results that were available during my care of the patient were reviewed by me and considered in my medical decision making (see chart for details).       Final Clinical Impressions(s) / ED Diagnoses   Final diagnoses:  Pain of upper abdomen    ED Discharge Orders    None       Pattricia Boss, MD 10/05/17 2358

## 2017-10-07 ENCOUNTER — Ambulatory Visit: Payer: BLUE CROSS/BLUE SHIELD | Admitting: Neurology

## 2017-10-07 ENCOUNTER — Encounter: Payer: Self-pay | Admitting: Neurology

## 2017-10-07 VITALS — BP 140/78 | HR 100 | Ht 64.0 in | Wt 274.0 lb

## 2017-10-07 DIAGNOSIS — Z9989 Dependence on other enabling machines and devices: Secondary | ICD-10-CM

## 2017-10-07 DIAGNOSIS — Z789 Other specified health status: Secondary | ICD-10-CM | POA: Diagnosis not present

## 2017-10-07 DIAGNOSIS — G4733 Obstructive sleep apnea (adult) (pediatric): Secondary | ICD-10-CM

## 2017-10-07 NOTE — Patient Instructions (Signed)
We will change your treatment from autoPAP to CPAP of 11 cm, you can choose your humidity level and ramp time yourself.  You may be able to use a nasal mask. Talk to [facility] about this.

## 2017-10-07 NOTE — Progress Notes (Signed)
Subjective:    Patient ID: Emily Mathis is a 52 y.o. female.  HPI     Interim history:   Emily Mathis is a 53 year old right-handed woman with an underlying medical history of hypothyroidism, chronic pancreatitis, constipation, iron deficiency anemia, polyarthralgia, type 2 diabetes, reflux disease, and morbid obesity, who presents for follow-up consultation of her obstructive sleep apnea, on home AutoPap therapy. The patient is unaccompanied today. I last saw her on 04/04/2017, at which time we talked her home sleep test results and reviewed her AutoPap compliance. She was compliant with treatment and indicated improvement of her daytime somnolence and nocturia. I suggested we proceed with an overnight pulse oximetry to make sure her oxygen saturations were adequate while she was on AutoPap therapy. She had an overnight pulse oximetry test on 05/06/2017 with good results, we called her with her test results in the interim.   Today, 04/04/2017 (all dictated new, as well as above notes, some dictation done in note pad or Word, outside of chart, may appear as copied):  I reviewed her AutoPap compliance data from 09/02/2017 through 10/01/2017 which is a total of 30 days, during which time she used her AutoPap every night with percent used days greater than 4 hours at 100%, indicating superb compliance with an average usage of 6 hours and 6 minutes, residual AHI at goal at 0.4 per hour, 95th percentile of the sure at 11 cm, leak on the higher end with the 95th percentile at 22.3 L/m, pressure range of 5-12 cm with EPR. She reports that she still compliant with treatment but sometimes the pressure does not seem high enough. She has some bloating at times, has had some symptoms that reminded her of her initial diagnosis of pancreatitis. She has seen her GI doctor in follow-up.    The patient's allergies, current medications, family history, past medical history, past social history, past surgical history   and problem list were reviewed and updated as appropriate.    Previously (copied from previous notes for reference):   I reviewed her AutoPap compliance data from 03/04/2017 through 04/02/2017, which is a total of 30 days, during which time she used her machine every night with percent used days greater than 4 hours at 97%, indicating excellent compliance with an average usage of 6 hours and 38 minutes, residual AHI 0.5 per hour, 95th percentile pressure at 10.5 cm, leak low with the 95th percentile at 3.8 L/m on a pressure range of 5-12 cm.   I first met her on 12/10/2016 at the request of her primary care provider, at which time she reported loud snoring and daytime somnolence as well as witnessed apneas. I advised her to return for a sleep study. Her insurance denied unattended sleep study. She had a home sleep test on 12/26/2016 indicating an AHI of 21.8 per hour, O2 nadir of 58% with time below 88% saturation of 46 minutes. Despite these test results her insurance denied a CPAP titration and I placed her on AutoPap therapy.    12/10/2016: (She) reports snoring, and excessive daytime somnolence. I reviewed your office note from 11/12/2016. She was recently hospitalized for in December 2017 from 08/19/16 to 08/22/16 for flare up of her pancreatitis. Her weight fluctuates some. She reports loud snoring per family, she has been told by her daughters that there is pauses in her breathing at times. She lives with one of her daughters, she has 3 grown daughters. She takes care of her 56-year-old grandson. Bedtime and wake time  vary, she is in bed typically late, between 1 and 2 AM. Wakeup time is around 8:30 AM. She reports nocturia about 2-3 times per average night and occasional morning headaches, Epworth sleepiness score is 12 out of 24, fatigue score is 63 out of 63. She has right-sided low back pain that stirrups her sleep, she changes position because of that. She denies restless leg symptoms but reports  neuropathy. Her diabetes control is better, latest A1c about a month ago was 6.1. She is a nonsmoker, does not drink alcohol, does not drink caffeine daily.   Her Past Medical History Is Significant For: Past Medical History:  Diagnosis Date  . Abnormal Pap smear of cervix 1990   Hx cryotherapy to cervix  . Abnormal uterine bleeding   . Anemia 02/20/16   Transfusion of 1 unit pRBCs  . Anxiety   . Anxiety and depression   . Arthritis   . Asthma   . 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 Chest pain    per pt due to anemia-   . Diabetes mellitus without complication ([place])   . Fibroid   . Generalized pain   . GERD (gastroesophageal reflux disease)   . Headache(784.0)   . History of blood transfusion 02/20/2016  . Hyperlipidemia   . Hypertension    per Dr. Irven Shelling note 08/30/2011  . Hypothyroidism   . Iron deficiency anemia 02/15/2016  . Leg swelling    bilateral  . Mild scoliosis 08/06/2017  . Neuromuscular disorder ([place])   . Shortness of breath dyspnea    on exertion due to anemia  . Thyroid disease    Hx 3 goiters--hx thyroidectomy--sees Dr. Forde Dandy    Her Past Surgical History Is Significant For: Past Surgical History:  Procedure Laterality Date  . CARDIAC CATHETERIZATION  2013  . CESAREAN SECTION  1995  . CESAREAN SECTION  1996  . COLONOSCOPY    . CYSTOSCOPY N/A 06/11/2016   Procedure: CYSTOSCOPY;  Surgeon: [name], MD;  Location: [facility];  Service: Gynecology;  Laterality: N/A;  . HYSTEROSCOPY W/D&C N/A 04/10/2016   Procedure: HYSTEROSCOPY WITH fractional dilatation and curretage;  Surgeon: [name], MD;  Location: [facility];  Service: Gynecology;  Laterality: N/A;  . LEFT HEART CATHETERIZATION WITH CORONARY ANGIOGRAM  07/24/2011   Procedure: LEFT HEART CATHETERIZATION WITH CORONARY ANGIOGRAM;  Surgeon: Laverda Page, MD;  Location: [facility] CATH LAB;  Service: Cardiovascular;;  . ROBOTIC ASSISTED TOTAL HYSTERECTOMY WITH SALPINGECTOMY Bilateral  06/11/2016   Procedure: ROBOTIC ASSISTED TOTAL HYSTERECTOMY WITH SALPINGECTOMY;  Surgeon: [name], MD;  Location: [facility];  Service: Gynecology;  Laterality: Bilateral;  . THYROIDECTOMY N/A 12/18/2012   Procedure: TOTAL THYROIDECTOMY;  Surgeon: Earnstine Regal, MD;  Location: [facility];  Service: General;  Laterality: N/A;  . UPPER GI ENDOSCOPY      Her Family History Is Significant For: Family History  Problem Relation Age of Onset  . Heart disease Mother   . Stroke Mother   . Hypertension Mother   . Heart disease Father   . Colon cancer Father   . Hypertension Father   . Diabetes Father   . Cancer Paternal Grandmother   . Asthma Daughter   . Seizures Daughter   . Hypertension Sister   . Thyroid disease Sister   . Mental illness Daughter 60       suicide attempt 06/2013  . Asthma Sister   . Hypertension Sister   .  Breast cancer Paternal Aunt   . Cancer Paternal Aunt     Her Social History Is Significant For: Social History   Socioeconomic History  . Marital status: Single    Spouse name: n/a  . Number of children: 3  . Years of education: Assoc  . Highest education level: Associate degree: academic program  Social Needs  . Financial resource strain: Hard  . Food insecurity - worry: None  . Food insecurity - inability: None  . Transportation needs - medical: No  . Transportation needs - non-medical: Yes  Occupational History  . Occupation: disabled    Comment: childcare  Tobacco Use  . Smoking status: Never Smoker  . Smokeless tobacco: Never Used  Substance and Sexual Activity  . Alcohol use: Yes    Alcohol/week: 0.0 oz    Frequency: Never    Comment: Occasional glass of wine  . Drug use: No  . Sexual activity: Not Currently    Partners: Male    Birth control/protection: Abstinence, Surgical    Comment: Hyst  Other Topics Concern  . None  Social History Narrative   Lives with 2 of her daughters and grandson.   Another daughter is married and  lives nearby, both she and her husband are disabled and rely on support from the patient.   Her son lives with his father, who moved out 04/2015, nearby.   Caffeine intake varies     Her Allergies Are:  Allergies  Allergen Reactions  . Dilaudid [Hydromorphone Hcl] Itching  . Hydrocodone Itching  . Latex Itching and Other (See Comments)    leaves marks on skin  :   Her Current Medications Are:  Outpatient Encounter Medications as of 10/07/2017  Medication Sig  . albuterol (PROVENTIL HFA;VENTOLIN HFA) 108 (90 Base) MCG/ACT inhaler Inhale 2 puffs into the lungs every 4 (four) hours as needed for wheezing or shortness of breath (cough, chest tightness).  . ALPRAZolam (XANAX) 0.5 MG tablet Take 1 tablet (0.5 mg total) by mouth at bedtime as needed. for sleep  . atorvastatin (LIPITOR) 20 MG tablet Take 1 tablet (20 mg total) by mouth daily.  . celecoxib (CELEBREX) 200 MG capsule TAKE 1 CAPSULE(200 MG) BY MOUTH TWICE DAILY  . chlorthalidone (HYGROTON) 25 MG tablet TAKE 1 TABLET(25 MG) BY MOUTH DAILY  . Cholecalciferol (VITAMIN D3) 50000 units CAPS Take 50,000 Units by mouth every Monday.   . cyclobenzaprine (FLEXERIL) 10 MG tablet Take 1 tablet (10 mg total) by mouth 3 (three) times daily as needed for muscle spasms.  . diazepam (VALIUM) 5 MG tablet Take 1 tablet (5 mg total) by mouth daily as needed. (Patient taking differently: Take 5 mg by mouth daily as needed for anxiety. )  . diclofenac (VOLTAREN) 75 MG EC tablet Take 1 tablet twice daily as needed for pain.  Marland Kitchen docusate sodium (COLACE) 250 MG capsule Take 1 capsule (250 mg total) by mouth daily. (Patient taking differently: Take 250 mg by mouth every other day. )  . ferrous sulfate 325 (65 FE) MG tablet Take 325 mg by mouth 3 (three) times daily with meals.  Marland Kitchen glycerin adult 2 g suppository Place 1 suppository rectally as needed for constipation.  . INVOKAMET 150-500 MG TABS TAKE 1 TABLET BY MOUTH DAILY  . levothyroxine (SYNTHROID,  LEVOTHROID) 300 MCG tablet Take 1 tablet (300 mcg total) by mouth daily.  Marland Kitchen lisinopril (PRINIVIL,ZESTRIL) 5 MG tablet Take 1 tablet (5 mg total) by mouth daily.  Marland Kitchen nystatin (MYCOSTATIN) powder Apply  topically 4 (four) times daily. (Patient taking differently: Apply topically 4 (four) times daily as needed (For heat rash.). )  . polyethylene glycol (MIRALAX / GLYCOLAX) packet Take 17 g by mouth 2 (two) times daily. Take 2 packets today (day one), wait 2 hours, if no stool produced then take 2 more packets on day one; then continue taking 2 packets daily until you achieve daily soft stools; if water stool develops then cut back to 1 packet daily. (Patient taking differently: Take 17 g by mouth 3 (three) times daily. )  . potassium chloride SA (K-DUR,KLOR-CON) 20 MEQ tablet Take 1 tablet (20 mEq total) by mouth 2 (two) times daily.  . ranitidine (ZANTAC) 150 MG tablet Take 1 tablet (150 mg total) by mouth 2 (two) times daily.  Marland Kitchen senna (SENOKOT) 8.6 MG TABS tablet Take 1 tablet (8.6 mg total) by mouth daily as needed for mild constipation.  . sertraline (ZOLOFT) 100 MG tablet Take 1 tablet (100 mg total) by mouth daily.  . sucralfate (CARAFATE) 1 G tablet Take 1 tablet (1 g total) by mouth 4 (four) times daily.   No facility-administered encounter medications on file as of 10/07/2017.   :  Review of Systems:  Out of a complete 14 point review of systems, all are reviewed and negative with the exception of these symptoms as listed below:  Review of Systems  Neurological:       Pt presents today to discuss her cpap. Pt feels that the pressure needs to be increased. Pt is also taking off her mask at night.    Objective:  Neurological Exam  Physical Exam Physical Examination:   Vitals:   10/07/17 1124  BP: 140/78  Pulse: 100    General Examination: The patient is a very pleasant 52 y.o. female in no acute distress. She appears well-developed and well-nourished and well groomed.    HEENT:Normocephalic, atraumatic, pupils are equal, round and reactive to light and accommodation. Extraocular tracking is good without limitation to gaze excursion or nystagmus noted. Normal smooth pursuit is noted. Hearing is grossly intact. Face is symmetric with normal facial animation and normal facial sensation. Speech is clear with no dysarthria noted. There is no hypophonia. There is no lip, neck/head, jaw or voice tremor. Neck is supple with full range of passive and active motion. Oropharynx exam reveals: mildmouth dryness, and moderateairway crowding. No facial or nasal sore. Tongue protrudes centrally and palate elevates symmetrically.    Chest:Clear to auscultation without wheezing, rhonchi or crackles noted.  Heart:S1+S2+0, regular and normal without murmurs, rubs or gallops noted.   Abdomen:Soft, non-tender and non-distended with normal bowel sounds appreciated on auscultation.  Extremities:There is nopitting edema in the distal lower extremities bilaterally.   Skin: Warm and dry without trophic changes noted.  Musculoskeletal: exam reveals some knee pain.   Neurologically:  Mental status: The patient is awake, alert and oriented in all 4 spheres. Herimmediate and remote memory, attention, language skills and fund of knowledge are appropriate. There is no evidence of aphasia, agnosia, apraxia or anomia. Speech is clear with normal prosody and enunciation. Thought process is linear. Mood is normaland affect is normal.  Cranial nerves II - XII are as described above under HEENT exam. In addition: shoulder shrug is normal with equal shoulder height noted. Motor exam: Normal bulk, strength and tone is noted. There is no drift, tremor or rebound. Romberg is negative. Reflexes are 1+ throughout. Fine motor skills and coordination: grossly intact.  Cerebellar testing: No dysmetria  or intention tremor on finger to nose testing. There is no truncal or gait ataxia.   Sensory exam: intact to light touch in the upper and lower extremities, with the exception of mild decrease in temperature sense and slight decrease in pinprick sensation in the distal lower extremities bilaterally.  Gait, station and balance: Shestands easily. No veering to one side is noted. No leaning to one side is noted. Posture is age-appropriate and stance is narrow based. Gait shows normalstride length and normalpace. No problems turning are noted.   Assessment and Plan:  In summary, Emily Mathis a very pleasant 52 year old female with an underlying medical history of hypothyroidism, chronic pancreatitis, constipation, iron deficiency anemia, polyarthralgia, type 2 diabetes, reflux disease, and morbid obesity, who presents for follow-up consultation of her moderate obstructive sleep apnea as determined by a home sleep test. She did not have an attended sleep study and her insurance also did not approve her for a CPAP titration study, despite evidence of moderate sleep apnea by AHI criteria and severe desaturations. She has established treatment on AutoPap and is compliant with treatment. In interim overnight pulse oximetry test in August 2018 showed no significant desaturations while she was on AutoPap. She does complain of difficulty tolerating the full facemask at times. Leak is on the high side. She is commended for her ongoing treatment adherence. I suggested that we change her from AutoPap to a set pressure of 11 cm. She is also encouraged to talk to her DME company about mask refit, maybe trying a nasal mask as opposed to full facemask. She is encouraged to continue to work on weight loss and to continue to be compliant with CPAP therapy. I suggested a 6 month follow-up, she can see one of our nurse practitioners next time. I answered all her questions today and she was in agreement.  I spent 15 minutes in total face-to-face time with the patient, more than 50% of which was  spent in counseling and coordination of care, reviewing test results, reviewing medication and discussing or reviewing the diagnosis of OSA, its prognosis and treatment options. Pertinent laboratory and imaging test results that were available during this visit with the patient were reviewed by me and considered in my medical decision making (see chart for details).

## 2017-10-11 ENCOUNTER — Encounter: Payer: BLUE CROSS/BLUE SHIELD | Attending: Orthopedic Surgery | Admitting: Registered"

## 2017-10-11 ENCOUNTER — Encounter: Payer: Self-pay | Admitting: Registered"

## 2017-10-11 DIAGNOSIS — E663 Overweight: Secondary | ICD-10-CM | POA: Insufficient documentation

## 2017-10-11 DIAGNOSIS — Z713 Dietary counseling and surveillance: Secondary | ICD-10-CM | POA: Insufficient documentation

## 2017-10-11 NOTE — Patient Instructions (Addendum)
Make sure to get in three meals per day. Try to have balanced meals like the plate example (see handout).   Recommend 2-3 carbohydrate choices per meal-want to have a consistent intake of carbohydrates at meals. For snacks-recommend 1 carbohydrate choice paired with a protein.   Try to include heart healthy unsaturated fats and limit foods high in saturated fat (see handout).   Try to get in 3-4 bottles of water or around 64 oz per day. Recommend having a water bottle with you throughout the day and setting reminders to drink may also be helpful.   Calorieking.com -Good resource to view nutrition information for packaged foods and food at restaurants.

## 2017-10-11 NOTE — Progress Notes (Signed)
Medical Nutrition Therapy:  Appt start time: 7628 end time:  1200.   Assessment:  Primary concerns today: Pt referred for weight management. Noted pt has hypothyroidism and T2DM. Pt reports she wants to lose weight and help with what she should eat with having diabetes. Pt reports she is supposed to check BG 3 times daily, but has not been doing so because she ran out of test strips, but is going to [facility] on 1/30 to get more strips. Pt reports that she does not currently go to the eye doctor regularly and does not check her feet regularly. Reports sometimes having symptoms of low blood sugar. Pt reports that her last A1C was 6.9 on 06/12/17. Pt reports she has not had any education regarding carbohydrate counting. Pt reports that she does have depression, but reports it has improved. Pt denies any thoughts of harming herself.   Food Intolerances: lactose intolerant but still drinks some milk.   Preferred Learning Style:  No preference indicated   Learning Readiness:   Ready  MEDICATIONS: See list.    DIETARY INTAKE:  Usual eating pattern includes 1-2 meals and some snacks each day. Typical snacks include-cookies, chips, peanut butter and jelly sandwich, fruit, yogurt. Meals are usually eaten in the living room with TV on.   Everyday foods vary.  Avoided foods include squash, okra, pinto beans, lima beans, egg unless boiled.    24-hr recall: Woke up around 10:30 am.  B ( AM): None reported.  Snk ( AM): None reported.  L ( PM): None reported.  Snk (3 PM): [employer] Bites-snack size bag, water  D ( PM): chicken flavored Spanish rice, sausage, onions, water  Snk ( PM): 5 soft chocolate chip cookies Beverages: less than 3 cups of water.   Usual physical activity: None currently due to back pain. Pt is doing physical therapy.   Estimated energy needs: ~1600 calories 180 g carbohydrates 99 g protein  36-62 g fat  Progress Towards Goal(s):  In progress.   Nutritional Diagnosis:   NI-5.11.1 Predicted suboptimal nutrient intake As related to skipping meals and unbalanced meals low in whole grains and vegetables.  As evidenced by pt's reported diet recall and habits .    Intervention:  Nutrition counseling provided. Dietitian provided diabetes education (diabetes management, relationship between dietary intake and insulin resistance/blood sugar, and carbohydrate counting/nutrition, symptoms of high and low blood sugar and how to manage it. Encouraged to to check her feet daily and to see an eye doctor annually. Provided education on heart healthy nutrition and mindful eating. Recommended pt follow physical therapist and MD recommendations regarding any physical activity. Discussed strategies for getting in more water. Pt appeared agreeable to information/goals discussed.   Goals/Instructions:   Make sure to get in three meals per day. Try to have balanced meals like the plate example (see handout).   Recommend 2-3 carbohydrate choices per meal-want to have a consistent intake of carbohydrates at meals. For snacks-recommend 1 carbohydrate choice paired with a protein.   Try to include heart healthy unsaturated fats and limit foods high in saturated fat (see handout).   Try to get in 3-4 bottles of water or around 64 oz per day. Recommend having a water bottle with you throughout the day and setting reminders to drink may also be helpful.   Calorieking.com -Good resource to view nutrition information for packaged foods and food at restaurants.   Teaching Method Utilized:  Visual Auditory   Handouts given during visit include:  Balanced plate with food list.   Living Well with Diabetes   Heart Healthy Nutrition   Snack Combinations  Barriers to learning/adherence to lifestyle change: None indicated.   Demonstrated degree of understanding via:  Teach Back   Monitoring/Evaluation:  Dietary intake, exercise, and body weight in 1 month(s).

## 2017-11-12 ENCOUNTER — Ambulatory Visit: Payer: BLUE CROSS/BLUE SHIELD | Admitting: Physician Assistant

## 2017-11-12 ENCOUNTER — Ambulatory Visit (INDEPENDENT_AMBULATORY_CARE_PROVIDER_SITE_OTHER): Payer: BLUE CROSS/BLUE SHIELD

## 2017-11-12 ENCOUNTER — Other Ambulatory Visit: Payer: Self-pay

## 2017-11-12 ENCOUNTER — Encounter: Payer: Self-pay | Admitting: Physician Assistant

## 2017-11-12 VITALS — BP 116/78 | HR 117 | Temp 98.1°F | Resp 16 | Ht 64.0 in | Wt 274.2 lb

## 2017-11-12 DIAGNOSIS — R2242 Localized swelling, mass and lump, left lower limb: Secondary | ICD-10-CM

## 2017-11-12 DIAGNOSIS — E890 Postprocedural hypothyroidism: Secondary | ICD-10-CM | POA: Diagnosis not present

## 2017-11-12 DIAGNOSIS — G4733 Obstructive sleep apnea (adult) (pediatric): Secondary | ICD-10-CM | POA: Diagnosis not present

## 2017-11-12 DIAGNOSIS — E119 Type 2 diabetes mellitus without complications: Secondary | ICD-10-CM | POA: Diagnosis not present

## 2017-11-12 DIAGNOSIS — K5900 Constipation, unspecified: Secondary | ICD-10-CM | POA: Diagnosis not present

## 2017-11-12 DIAGNOSIS — M179 Osteoarthritis of knee, unspecified: Secondary | ICD-10-CM | POA: Diagnosis not present

## 2017-11-12 NOTE — Progress Notes (Signed)
Patient ID: Emily Mathis, female    DOB: 10/29/1965, 52 y.o.   MRN: [id number]  PCP: Porfirio Oar, PA-C  Chief Complaint  Patient presents with  . Follow-up    Subjective:   Presents for evaluation of mood and constipation.   Overall, she is feeling so much better. Cough is much better. Occasional SOB/CP. Feels much more relaxed, "Just trying not to worry about stuff. 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  Was supposed to see endocrinology this month, but couldn't afford it. Last TSH was 05/2017, but she doesn't remember the result. Chart reviewed. At her last visit, she related that the TSH was 80 in 05/2017. A1C was 6.9%  Hasn't been able to afford seeing her eye specialist. Hopes to do so in the next 1-2 months.  Review of Systems  Constitutional: Negative for activity change, appetite change, fatigue and unexpected weight change.  HENT: Negative for congestion, dental problem, ear pain, hearing loss, mouth sores, postnasal drip, rhinorrhea, sneezing, sore throat, tinnitus and trouble swallowing.   Eyes: Negative for photophobia, pain, redness and visual disturbance.  Respiratory: Positive for shortness of breath (associated with stress/anxiety). Negative for cough and chest tightness.   Cardiovascular: Positive for chest pain (associated with stress/anxiety). Negative for palpitations and leg swelling.  Gastrointestinal: Negative for abdominal pain, blood in stool, constipation, diarrhea, nausea and vomiting.  Endocrine: Negative for cold intolerance, heat intolerance, polydipsia, polyphagia and polyuria.  Genitourinary: Negative for dysuria, frequency, hematuria and urgency.  Musculoskeletal: Negative for arthralgias, gait problem, myalgias and neck stiffness.       Tender lump on the anterior LEFT  tibia, present for "years," without change in size. Is tender, and she reports more bothersome recently.  Skin: Negative for rash.  Neurological: Negative for dizziness, speech difficulty, weakness, light-headedness, numbness and headaches.  Hematological: Negative for adenopathy.  Psychiatric/Behavioral: Positive for dysphoric mood (MUCH improved). Negative for confusion and sleep disturbance. The patient is nervous/anxious [facility] improved).        Patient Active Problem List   Diagnosis Date Noted  . OSA (obstructive sleep apnea) 11/12/2017  . DJD (degenerative joint disease) of knee 09/13/2017  . DDD (degenerative disc disease), lumbar 09/13/2017  . DDD (degenerative disc disease), cervical 09/13/2017  . Abdominal pain   . Chronic pancreatitis (HCC)   . Constipation   . Status post laparoscopic hysterectomy 06/11/2016  . Myalgia 02/15/2016  . Upper airway cough syndrome 10/21/2015  . Polyarthralgia 06/16/2015  . Diabetes mellitus type 2, uncomplicated (HCC) 09/14/2013  . Keloid scar, cervical incision 09/02/2013  . Reaction, situational, acute, to stress 07/02/2013  . 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 BMI 45.0-49.9, adult (HCC) 03/27/2013  . GERD (gastroesophageal reflux disease) 02/15/2013  . Hypocalcemia 01/07/2013  . Chest pain on exertion 07/24/2011     Prior to Admission medications   Medication Sig Start Date End Date Taking? Authorizing Provider  albuterol (PROVENTIL HFA;VENTOLIN HFA) 108 (90 Base) MCG/ACT inhaler Inhale 2 puffs into the lungs every 4 (four) hours as needed for wheezing or shortness of breath (cough, chest tightness). 09/06/17  Yes Vladislav Axelson, PA-C  ALPRAZolam (XANAX) 0.5 MG tablet Take 1 tablet (0.5 mg total) by mouth at bedtime as needed. for sleep 08/15/17  Yes Idelle Reimann, PA-C  atorvastatin (LIPITOR) 20 MG tablet  Take 1 tablet (20 mg total) by mouth daily. 11/13/16  Yes Ardean Simonich, PA-C  Canagliflozin-Metformin HCl 150-500 MG  TABS Take by mouth.   Yes [provider]  celecoxib (CELEBREX) 200 MG capsule TAKE 1 CAPSULE(200 MG) BY MOUTH TWICE DAILY 09/14/17  Yes Annielee Jemmott, PA-C  chlorthalidone (HYGROTON) 25 MG tablet TAKE 1 TABLET(25 MG) BY MOUTH DAILY 09/05/16  Yes Olumide Dolinger, PA-C  Cholecalciferol (VITAMIN D3) 50000 units CAPS Take 50,000 Units by mouth every Monday.  06/14/16  Yes [provider]  cyclobenzaprine (FLEXERIL) 10 MG tablet Take 1 tablet (10 mg total) by mouth 3 (three) times daily as needed for muscle spasms. 11/12/16  Yes Larell Baney, PA-C  diazepam (VALIUM) 5 MG tablet Take 1 tablet (5 mg total) by mouth daily as needed. Patient taking differently: Take 5 mg by mouth daily as needed for anxiety.  08/15/17  Yes Mackay Hanauer, PA-C  diclofenac (VOLTAREN) 75 MG EC tablet Take 1 tablet twice daily as needed for pain. 08/15/17  Yes [provider]  docusate sodium (COLACE) 250 MG capsule Take 1 capsule (250 mg total) by mouth daily. Patient taking differently: Take by mouth.  09/06/16  Yes Melayah Skorupski, PA-C  ferrous sulfate 325 (65 FE) MG tablet Take 325 mg by mouth 3 (three) times daily with meals.   Yes [provider]  glycerin adult 2 g suppository Place 1 suppository rectally as needed for constipation.   Yes [provider]  levothyroxine (SYNTHROID, LEVOTHROID) 300 MCG tablet Take 1 tablet (300 mcg total) by mouth daily. 06/17/15  Yes Elson Ulbrich, PA-C  lisinopril (PRINIVIL,ZESTRIL) 5 MG tablet Take 1 tablet (5 mg total) by mouth daily. 11/12/16  Yes Mazelle Huebert, PA-C  nystatin (MYCOSTATIN) powder Apply topically 4 (four) times daily. Patient taking differently: Apply topically 4 (four) times daily as needed (For heat rash.).  10/02/15  Yes Dianne Whelchel, PA-C  polyethylene glycol (MIRALAX / GLYCOLAX) packet Take 17 g by mouth 2 (two) times daily. Take 2 packets today (day one), wait 2 hours, if no stool produced then take 2 more  packets on day one; then continue taking 2 packets daily until you achieve daily soft stools; if water stool develops then cut back to 1 packet daily. Patient taking differently: Take 17 g by mouth 3 (three) times daily.  07/24/16  Yes Street, [place], PA-C  potassium chloride SA (K-DUR,KLOR-CON) 20 MEQ tablet Take 1 tablet (20 mEq total) by mouth 2 (two) times daily. 06/12/16  Yes Patton Salles, MD  ranitidine (ZANTAC) 150 MG tablet Take 1 tablet (150 mg total) by mouth 2 (two) times daily. 02/05/17  Yes Karthikeya Funke, PA-C  senna (SENOKOT) 8.6 MG TABS tablet Take 1 tablet (8.6 mg total) by mouth daily as needed for mild constipation. 08/22/16  Yes Mikell, Antionette Poles, MD  sertraline (ZOLOFT) 100 MG tablet Take 1 tablet (100 mg total) by mouth daily. 08/15/17  Yes Jodeen Mclin, PA-C  sucralfate (CARAFATE) 1 G tablet Take 1 tablet (1 g total) by mouth 4 (four) times daily. 04/19/15  Yes Marisa Severin, MD     Allergies  Allergen Reactions  . Dilaudid [Hydromorphone Hcl] Itching  . Hydrocodone Itching  . Latex Itching and Other (See Comments)    leaves marks on skin       Objective:  Physical Exam  Constitutional: She is oriented to person, place, and time. She appears well-developed and well-nourished. She is active and cooperative. No distress.  BP  116/78   Pulse (!) 117   Temp 98.1 F (36.7 C)   Resp 16   Ht 5\' 4"  (1.626 m)   Wt 274 lb 3.2 oz (124.4 kg)   LMP 05/08/2016 (Exact Date)   SpO2 96%   BMI 47.07 kg/m   HENT:  Head: Normocephalic and atraumatic.  Right Ear: Hearing normal.  Left Ear: Hearing normal.  Eyes: Conjunctivae are normal. No scleral icterus.  Neck: Normal range of motion. Neck supple. No thyromegaly present.  Cardiovascular: Normal rate, regular rhythm and normal heart sounds.  Pulses:      Radial pulses are 2+ on the right side, and 2+ on the left side.  Pulmonary/Chest: Effort normal and breath sounds normal.  Musculoskeletal:        Legs: Lymphadenopathy:       Head (right side): No tonsillar, no preauricular, no posterior auricular and no occipital adenopathy present.       Head (left side): No tonsillar, no preauricular, no posterior auricular and no occipital adenopathy present.    She has no cervical adenopathy.       Right: No supraclavicular adenopathy present.       Left: No supraclavicular adenopathy present.  Neurological: She is alert and oriented to person, place, and time. No sensory deficit.  Skin: Skin is warm, dry and intact. No rash noted. No cyanosis or erythema. Nails show no clubbing.  Psychiatric: She has a normal mood and affect. Her speech is normal and behavior is normal.  Not tearful today, which is improved relative to previous visits. She appears relaxed, smiles frequently, and engages easily, shifting her attention between me and her grandson, who accompanies her.           Assessment & Plan:   Problem List Items Addressed This Visit    Diabetes mellitus type 2, uncomplicated (HCC) - Primary (Chronic)    Managed by endocrinology, but she wasn't able to keep her last appointment, due to finances. I will update the labs today and forward them to Dr. Evlyn Kanner for guidance.      Relevant Orders   [facility] DIABETES EYE EXAM (Completed)   Comprehensive metabolic panel (Completed)   Hemoglobin A1c (Completed)   Lipid panel (Completed)   Hypothyroidism, postsurgical    Managed by endocrinology, but she missed her last appointment due to finances. TSH was reportedly 80. Currently on levothyroxine 300 mcg daily. Will update labs today and forward to Dr. Evlyn Kanner for guidance.      Relevant Orders   TSH (Completed)   T4 (Completed)   Constipation    Improved. Continue healthy eating, exercise, hydration and stool softener, osmotic laxative as needed.      OSA (obstructive sleep apnea)    Continues on CPAP. Good compliance, per last neurology note.       Other Visit Diagnoses    Mass of left  lower leg       Unclear etiology. Likely benign, given duration since onset without change. Await radiographs.   Relevant Orders   DG Tibia/Fibula Left (Completed)       Return in about 3 months (around 02/09/2018) for re-evalaution of blood pressure, mood, etc.   Fernande Bras, PA-C [facility]

## 2017-11-12 NOTE — Patient Instructions (Signed)
     IF you received an x-ray today, you will receive an invoice from [facility]. Please contact [facility] at [phone] with questions or concerns regarding your invoice.   IF you received labwork today, you will receive an invoice from [facility]. Please contact [facility] at [phone] with questions or concerns regarding your invoice.   Our billing staff will not be able to assist you with questions regarding bills from these companies.  You will be contacted with the lab results as soon as they are available. The fastest way to get your results is to activate your My Chart account. Instructions are located on the last page of this paperwork. If you have not heard from us regarding the results in 2 weeks, please contact this office.     

## 2017-11-13 ENCOUNTER — Other Ambulatory Visit: Payer: Self-pay | Admitting: Physician Assistant

## 2017-11-13 LAB — COMPREHENSIVE METABOLIC PANEL
A/G RATIO: 1.1 — AB (ref 1.2–2.2)
ALBUMIN: 4 g/dL (ref 3.5–5.5)
ALK PHOS: 94 IU/L (ref 39–117)
ALT: 18 IU/L (ref 0–32)
AST: 15 IU/L (ref 0–40)
BUN / CREAT RATIO: 19 (ref 9–23)
BUN: 15 mg/dL (ref 6–24)
Bilirubin Total: 0.4 mg/dL (ref 0.0–1.2)
CHLORIDE: 100 mmol/L (ref 96–106)
CO2: 24 mmol/L (ref 20–29)
Calcium: 9 mg/dL (ref 8.7–10.2)
Creatinine, Ser: 0.81 mg/dL (ref 0.57–1.00)
GFR calc non Af Amer: 84 mL/min/{1.73_m2} (ref >59)
GFR, EST AFRICAN AMERICAN: 97 mL/min/{1.73_m2} (ref >59)
GLUCOSE: 167 mg/dL — AB (ref 65–99)
Globulin, Total: 3.6 g/dL (ref 1.5–4.5)
Potassium: 4 mmol/L (ref 3.5–5.2)
Sodium: 140 mmol/L (ref 134–144)
TOTAL PROTEIN: 7.6 g/dL (ref 6.0–8.5)

## 2017-11-13 LAB — LIPID PANEL
CHOL/HDL RATIO: 3.4 ratio (ref 0.0–4.4)
Cholesterol, Total: 162 mg/dL (ref 100–199)
HDL: 47 mg/dL (ref >39)
LDL CALC: 97 mg/dL (ref 0–99)
Triglycerides: 89 mg/dL (ref 0–149)
VLDL Cholesterol Cal: 18 mg/dL (ref 5–40)

## 2017-11-13 LAB — HEMOGLOBIN A1C
Est. average glucose Bld gHb Est-mCnc: 154 mg/dL
HEMOGLOBIN A1C: 7 % — AB (ref 4.8–5.6)

## 2017-11-13 LAB — TSH: TSH: 0.43 u[IU]/mL — ABNORMAL LOW (ref 0.450–4.500)

## 2017-11-13 LAB — T4: T4 TOTAL: 9.1 ug/dL (ref 4.5–12.0)

## 2017-11-13 NOTE — Assessment & Plan Note (Signed)
Improved. Continue healthy eating, exercise, hydration and stool softener, osmotic laxative as needed.

## 2017-11-13 NOTE — Assessment & Plan Note (Signed)
Continues on CPAP. Good compliance, per last neurology note.

## 2017-11-13 NOTE — Assessment & Plan Note (Signed)
Managed by endocrinology, but she wasn't able to keep her last appointment, due to finances. I will update the labs today and forward them to Dr. Forde Dandy for guidance.

## 2017-11-13 NOTE — Assessment & Plan Note (Signed)
Managed by endocrinology, but she missed her last appointment due to finances. TSH was reportedly 80. Currently on levothyroxine 300 mcg daily. Will update labs today and forward to Dr. Forde Dandy for guidance.

## 2017-11-20 DIAGNOSIS — G4733 Obstructive sleep apnea (adult) (pediatric): Secondary | ICD-10-CM | POA: Diagnosis not present

## 2017-11-28 ENCOUNTER — Emergency Department (HOSPITAL_COMMUNITY): Payer: BLUE CROSS/BLUE SHIELD

## 2017-11-28 ENCOUNTER — Emergency Department (HOSPITAL_COMMUNITY)
Admission: EM | Admit: 2017-11-28 | Discharge: 2017-11-28 | Disposition: A | Discharge status: Home / Self Care | Payer: BLUE CROSS/BLUE SHIELD | Attending: Emergency Medicine | Admitting: Emergency Medicine

## 2017-11-28 ENCOUNTER — Encounter (HOSPITAL_COMMUNITY): Payer: Self-pay

## 2017-11-28 ENCOUNTER — Other Ambulatory Visit: Payer: Self-pay

## 2017-11-28 DIAGNOSIS — Z79899 Other long term (current) drug therapy: Secondary | ICD-10-CM | POA: Diagnosis not present

## 2017-11-28 DIAGNOSIS — R1011 Right upper quadrant pain: Secondary | ICD-10-CM | POA: Diagnosis not present

## 2017-11-28 DIAGNOSIS — E119 Type 2 diabetes mellitus without complications: Secondary | ICD-10-CM | POA: Insufficient documentation

## 2017-11-28 DIAGNOSIS — R1013 Epigastric pain: Secondary | ICD-10-CM | POA: Insufficient documentation

## 2017-11-28 DIAGNOSIS — I10 Essential (primary) hypertension: Secondary | ICD-10-CM | POA: Insufficient documentation

## 2017-11-28 DIAGNOSIS — R1084 Generalized abdominal pain: Secondary | ICD-10-CM | POA: Diagnosis not present

## 2017-11-28 DIAGNOSIS — Z91040 Latex allergy status: Secondary | ICD-10-CM | POA: Insufficient documentation

## 2017-11-28 DIAGNOSIS — G8929 Other chronic pain: Secondary | ICD-10-CM

## 2017-11-28 DIAGNOSIS — R109 Unspecified abdominal pain: Secondary | ICD-10-CM

## 2017-11-28 LAB — CBC
HCT: 39.2 % (ref 36.0–46.0)
Hemoglobin: 12.6 g/dL (ref 12.0–15.0)
MCH: 29.6 pg (ref 26.0–34.0)
MCHC: 32.1 g/dL (ref 30.0–36.0)
MCV: 92.2 fL (ref 78.0–100.0)
PLATELETS: 453 10*3/uL — AB (ref 150–400)
RBC: 4.25 MIL/uL (ref 3.87–5.11)
RDW: 14.4 % (ref 11.5–15.5)
WBC: 11.1 10*3/uL — ABNORMAL HIGH (ref 4.0–10.5)

## 2017-11-28 LAB — URINALYSIS, ROUTINE W REFLEX MICROSCOPIC
BILIRUBIN URINE: NEGATIVE
Glucose, UA: >=500 mg/dL — AB
Hgb urine dipstick: NEGATIVE
Ketones, ur: NEGATIVE mg/dL
LEUKOCYTES UA: NEGATIVE
NITRITE: NEGATIVE
PH: 6 (ref 5.0–8.0)
Protein, ur: NEGATIVE mg/dL
SPECIFIC GRAVITY, URINE: 1.029 (ref 1.005–1.030)

## 2017-11-28 LAB — COMPREHENSIVE METABOLIC PANEL
ALK PHOS: 97 U/L (ref 38–126)
ALT: 22 U/L (ref 14–54)
AST: 26 U/L (ref 15–41)
Albumin: 3.9 g/dL (ref 3.5–5.0)
Anion gap: 10 (ref 5–15)
BILIRUBIN TOTAL: 0.3 mg/dL (ref 0.3–1.2)
BUN: 21 mg/dL — AB (ref 6–20)
CO2: 28 mmol/L (ref 22–32)
CREATININE: 0.89 mg/dL (ref 0.44–1.00)
Calcium: 8.8 mg/dL — ABNORMAL LOW (ref 8.9–10.3)
Chloride: 102 mmol/L (ref 101–111)
GFR calc Af Amer: >60 mL/min (ref >60)
GFR calc non Af Amer: >60 mL/min (ref >60)
Glucose, Bld: 153 mg/dL — ABNORMAL HIGH (ref 65–99)
Potassium: 3.6 mmol/L (ref 3.5–5.1)
Sodium: 140 mmol/L (ref 135–145)
TOTAL PROTEIN: 8.7 g/dL — AB (ref 6.5–8.1)

## 2017-11-28 LAB — LIPASE, BLOOD: Lipase: 28 U/L (ref 11–51)

## 2017-11-28 MED ORDER — OXYCODONE-ACETAMINOPHEN 5-325 MG PO TABS: 1.0000 | ORAL_TABLET | Freq: Three times a day (TID) | ORAL | 0 refills | Status: DC | PRN

## 2017-11-28 MED ORDER — ONDANSETRON 4 MG PO TBDP: 4.0000 mg | ORAL_TABLET | Freq: Three times a day (TID) | ORAL | 0 refills | Status: DC | PRN

## 2017-11-28 MED ORDER — ONDANSETRON HCL 4 MG/2ML IJ SOLN
4.0000 mg | Freq: Once | INTRAMUSCULAR | Status: AC
  Administered 2017-11-28: 4 mg via INTRAVENOUS
  Filled 2017-11-28: qty 2

## 2017-11-28 MED ORDER — MORPHINE SULFATE (PF) 4 MG/ML IV SOLN
4.0000 mg | Freq: Once | INTRAVENOUS | Status: AC
Start: 2017-11-28 — End: 2017-11-28
  Administered 2017-11-28: 4 mg via INTRAVENOUS
  Filled 2017-11-28: qty 1

## 2017-11-28 NOTE — Discharge Instructions (Signed)
Please read and follow all provided instructions.  Your diagnoses today include:  1. Chronic abdominal pain    Tests performed today include:  Blood counts and electrolytes  Blood tests to check liver and kidney function  Blood tests to check pancreas function  Urine test to look for infection and pregnancy  Ultrasound - no concerning findings  Vital signs. See below for your results today.   Medications prescribed:   Percocet (oxycodone/acetaminophen) - narcotic pain medication  DO NOT drive or perform any activities that require you to be awake and alert because this medicine can make you drowsy. BE VERY CAREFUL not to take multiple medicines containing Tylenol (also called acetaminophen). Doing so can lead to an overdose which can damage your liver and cause liver failure and possibly death.   Zofran (ondansetron) - for nausea and vomiting  Take any prescribed medications only as directed.  Home care instructions:   Follow any educational materials contained in this packet.  Follow-up instructions: Please follow-up with your primary care provider in the next 2 days for further evaluation of your symptoms.    Return instructions:  SEEK IMMEDIATE MEDICAL ATTENTION IF:  The pain does not go away or becomes severe   A temperature above 101F develops   Repeated vomiting occurs (multiple episodes)   The pain becomes localized to portions of the abdomen. The right side could possibly be appendicitis. In an adult, the left lower portion of the abdomen could be colitis or diverticulitis.   Blood is being passed in stools or vomit (bright red or black tarry stools)   You develop chest pain, difficulty breathing, dizziness or fainting, or become confused, poorly responsive, or inconsolable (young children)  If you have any other emergent concerns regarding your health  Additional Information: Abdominal (belly) pain can be caused by many things. Your caregiver performed  an examination and possibly ordered blood/urine tests and imaging (CT scan, x-rays, ultrasound). Many cases can be observed and treated at home after initial evaluation in the emergency department. Even though you are being discharged home, abdominal pain can be unpredictable. Therefore, you need a repeated exam if your pain does not resolve, returns, or worsens. Most patients with abdominal pain don't have to be admitted to the hospital or have surgery, but serious problems like appendicitis and gallbladder attacks can start out as nonspecific pain. Many abdominal conditions cannot be diagnosed in one visit, so follow-up evaluations are very important.  Your vital signs today were: BP (!) 101/55 (BP Location: Left Arm)    Pulse 89    Temp (!) 97.4 F (36.3 C)    Resp 14    LMP 05/08/2016 (Exact Date)    SpO2 93%  If your blood pressure (bp) was elevated above 135/85 this visit, please have this repeated by your doctor within one month. --------------

## 2017-11-28 NOTE — ED Provider Notes (Signed)
[facility] DEPT Provider Note   CSN: [id number] Arrival date & time: 11/28/17  0140     History   Chief Complaint Chief Complaint  Patient presents with  . Flank Pain    R  . Abdominal Pain    HPI Emily Mathis is a 52 y.o. female.  Patient with history of pancreatitis, GERD, diabetes --presents with acute onset of right upper quadrant abdominal pain and epigastric pain starting a day ago.  Patient had similar pain in the past that she relates to her previous pancreatitis.  Patient has a gallbladder but denies history of gallstones.  She states that she drinks alcohol occasionally without any recent use.  Pain is worse with taking a deep breath.  She denies fevers, nausea, vomiting, diarrhea, constipation.  No urinary symptoms including hematuria or dysuria.  She took ibuprofen prior to arrival without relief.  She denies taking this daily. The onset of this condition was acute. The course is constant. Aggravating factors: none. Alleviating factors: none.        Past Medical History:  Diagnosis Date  . Abnormal Pap smear of cervix 1990   Hx cryotherapy to cervix  . Abnormal uterine bleeding   . Anemia 02/20/16   Transfusion of 1 unit pRBCs  . Anxiety   . Anxiety and depression   . Arthritis   . Asthma   . 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 Chest pain    per pt due to anemia-   . Diabetes mellitus without complication ([place])   . Fibroid   . Generalized pain   . GERD (gastroesophageal reflux disease)   . Headache(784.0)   . History of blood transfusion 02/20/2016  . Hyperlipidemia   . Hypertension    per Dr. Irven Shelling note 08/30/2011  . Hypothyroidism   . Iron deficiency anemia 02/15/2016  . Leg swelling    bilateral  . Mild scoliosis 08/06/2017  . Neuromuscular disorder ([place])   . Shortness of breath dyspnea    on exertion due to anemia  . Sleep apnea   . Thyroid disease    Hx 3 goiters--hx thyroidectomy--sees Dr. Forde Dandy     Patient Active Problem List   Diagnosis Date Noted  . OSA (obstructive sleep apnea) 11/12/2017  . DJD (degenerative joint disease) of knee 09/13/2017  . DDD (degenerative disc disease), lumbar 09/13/2017  . DDD (degenerative disc disease), cervical 09/13/2017  . Abdominal pain   . Chronic pancreatitis ([place])   . Constipation   . Status post laparoscopic hysterectomy 06/11/2016  . Myalgia 02/15/2016  . Upper airway cough syndrome 10/21/2015  . Polyarthralgia 06/16/2015  . Diabetes mellitus type 2, uncomplicated ([place]) 85/10/7739  . Keloid scar, cervical incision 09/02/2013  . Reaction, situational, acute, to stress 07/02/2013  . 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 BMI 45.0-49.9, adult ([place]) 03/27/2013  . GERD (gastroesophageal reflux disease) 02/15/2013  . Hypocalcemia 01/07/2013  . Chest pain on exertion 07/24/2011    Past Surgical History:  Procedure Laterality Date  . CARDIAC CATHETERIZATION  2013  . CESAREAN SECTION  1995  . CESAREAN SECTION  1996  . COLONOSCOPY    . CYSTOSCOPY N/A 06/11/2016   Procedure: CYSTOSCOPY;  Surgeon: [name], MD;  Location: [facility];  Service: Gynecology;  Laterality: N/A;  . HYSTEROSCOPY W/D&C N/A 04/10/2016   Procedure: HYSTEROSCOPY WITH fractional dilatation and curretage;  Surgeon: [name], MD;  Location: [facility];  Service: Gynecology;  Laterality: N/A;  .  LEFT HEART CATHETERIZATION WITH CORONARY ANGIOGRAM  07/24/2011   Procedure: LEFT HEART CATHETERIZATION WITH CORONARY ANGIOGRAM;  Surgeon: Laverda Page, MD;  Location: [facility];  Service: Cardiovascular;;  . ROBOTIC ASSISTED TOTAL HYSTERECTOMY WITH SALPINGECTOMY Bilateral 06/11/2016   Procedure: ROBOTIC ASSISTED TOTAL HYSTERECTOMY WITH SALPINGECTOMY;  Surgeon: [name], MD;  Location: [facility];  Service: Gynecology;  Laterality: Bilateral;  . THYROIDECTOMY N/A 12/18/2012   Procedure: TOTAL THYROIDECTOMY;  Surgeon: Earnstine Regal, MD;   Location: [facility];  Service: General;  Laterality: N/A;  . UPPER GI ENDOSCOPY      OB History    Gravida Para Term Preterm AB Living   3 3 3     3    SAB TAB Ectopic Multiple Live Births                   Home Medications    Prior to Admission medications   Medication Sig Start Date End Date Taking? Authorizing Provider  albuterol (PROVENTIL HFA;VENTOLIN HFA) 108 (90 Base) MCG/ACT inhaler Inhale 2 puffs into the lungs every 4 (four) hours as needed for wheezing or shortness of breath (cough, chest tightness). 09/06/17   Harrison Mons, PA-C  ALPRAZolam (XANAX) 0.5 MG tablet Take 1 tablet (0.5 mg total) by mouth at bedtime as needed. for sleep 08/15/17   Harrison Mons, PA-C  atorvastatin (LIPITOR) 20 MG tablet Take 1 tablet (20 mg total) by mouth daily. 11/13/16   Harrison Mons, PA-C  celecoxib (CELEBREX) 200 MG capsule TAKE 1 CAPSULE(200 MG) BY MOUTH TWICE DAILY 09/14/17   Harrison Mons, PA-C  chlorthalidone (HYGROTON) 25 MG tablet TAKE 1 TABLET(25 MG) BY MOUTH DAILY 09/05/16   Harrison Mons, PA-C  Cholecalciferol (VITAMIN D3) 50000 units CAPS Take 50,000 Units by mouth every Monday.  06/14/16   [provider]  cyclobenzaprine (FLEXERIL) 10 MG tablet Take 1 tablet (10 mg total) by mouth 3 (three) times daily as needed for muscle spasms. 11/12/16   Harrison Mons, PA-C  diazepam (VALIUM) 5 MG tablet Take 1 tablet (5 mg total) by mouth daily as needed. Patient taking differently: Take 5 mg by mouth daily as needed for anxiety.  08/15/17   Harrison Mons, PA-C  diclofenac (VOLTAREN) 75 MG EC tablet Take 1 tablet twice daily as needed for pain. 08/15/17   [provider]  docusate sodium (COLACE) 250 MG capsule Take 1 capsule (250 mg total) by mouth daily. Patient taking differently: Take by mouth.  09/06/16   Harrison Mons, PA-C  ferrous sulfate 325 (65 FE) MG tablet Take 325 mg by mouth 3 (three) times daily with meals.    [provider]  glycerin  adult 2 g suppository Place 1 suppository rectally as needed for constipation.    [provider]  INVOKAMET 150-500 MG TABS TAKE 1 TABLET BY MOUTH DAILY 09/14/17   Harrison Mons, PA-C  levothyroxine (SYNTHROID, LEVOTHROID) 300 MCG tablet Take 1 tablet (300 mcg total) by mouth daily. 06/17/15   Jeffery, [name], PA-C  lisinopril (PRINIVIL,ZESTRIL) 5 MG tablet Take 1 tablet (5 mg total) by mouth daily. 11/12/16   Harrison Mons, PA-C  nystatin (MYCOSTATIN) powder Apply topically 4 (four) times daily. Patient taking differently: Apply topically 4 (four) times daily as needed (For heat rash.).  10/02/15   Harrison Mons, PA-C  polyethylene glycol (MIRALAX / GLYCOLAX) packet Take 17 g by mouth 2 (two) times daily. Take 2 packets today (day one), wait 2 hours, if no stool produced then  take 2 more packets on day one; then continue taking 2 packets daily until you achieve daily soft stools; if water stool develops then cut back to 1 packet daily. Patient taking differently: Take 17 g by mouth 3 (three) times daily.  07/24/16   Street, [place], PA-C  potassium chloride SA (K-DUR,KLOR-CON) 20 MEQ tablet Take 1 tablet (20 mEq total) by mouth 2 (two) times daily. 06/12/16   [name], MD  ranitidine (ZANTAC) 150 MG tablet Take 1 tablet (150 mg total) by mouth 2 (two) times daily. 02/05/17   Harrison Mons, PA-C  senna (SENOKOT) 8.6 MG TABS tablet Take 1 tablet (8.6 mg total) by mouth daily as needed for mild constipation. 08/22/16   Mikell, Jeani Sow, MD  sertraline (ZOLOFT) 100 MG tablet Take 1 tablet (100 mg total) by mouth daily. 08/15/17   Harrison Mons, PA-C  sucralfate (CARAFATE) 1 G tablet Take 1 tablet (1 g total) by mouth 4 (four) times daily. 04/19/15   Linton Flemings, MD    Family History Family History  Problem Relation Age of Onset  . Heart disease Mother   . Stroke Mother   . Hypertension Mother   . Heart disease Father   . Colon cancer Father   . Hypertension Father    . Diabetes Father   . Cancer Paternal Grandmother   . Asthma Daughter   . Seizures Daughter   . Hypertension Sister   . Thyroid disease Sister   . Mental illness Daughter 90       suicide attempt 06/2013  . Asthma Sister   . Hypertension Sister   . Breast cancer Paternal Aunt   . Cancer Paternal Aunt     Social History Social History   Tobacco Use  . Smoking status: Never Smoker  . Smokeless tobacco: Never Used  Substance Use Topics  . Alcohol use: Yes    Alcohol/week: 0.0 oz    Frequency: Never    Comment: Occasional glass of wine  . Drug use: No     Allergies   Dilaudid [hydromorphone hcl]; Hydrocodone; and Latex   Review of Systems Review of Systems  Constitutional: Negative for fever.  HENT: Negative for rhinorrhea and sore throat.   Eyes: Negative for redness.  Respiratory: Negative for cough and shortness of breath.   Cardiovascular: Negative for chest pain.  Gastrointestinal: Positive for abdominal pain. Negative for diarrhea, nausea and vomiting.  Genitourinary: Negative for dysuria.  Musculoskeletal: Negative for myalgias.  Skin: Negative for rash.  Neurological: Negative for headaches.     Physical Exam Updated Vital Signs BP 128/78   Pulse 97   Temp (!) 97.4 F (36.3 C)   Resp (!) 22   LMP 05/08/2016 (Exact Date)   SpO2 98%   Physical Exam  Constitutional: She appears well-developed and well-nourished. She appears distressed (Patient appears somewhat uncomfortable).  HENT:  Head: Normocephalic and atraumatic.  Eyes: Conjunctivae are normal. Right eye exhibits no discharge. Left eye exhibits no discharge.  Neck: Normal range of motion. Neck supple.  Cardiovascular: Normal rate, regular rhythm and normal heart sounds.  Pulmonary/Chest: Effort normal and breath sounds normal.  Abdominal: Soft. There is tenderness (Mild to moderate) in the right upper quadrant and epigastric area. There is no rebound, no guarding, no tenderness at McBurney's  point and negative Murphy's sign.  Neurological: She is alert.  Skin: Skin is warm and dry.  Psychiatric: She has a normal mood and affect.  Nursing note and vitals reviewed.  ED Treatments / Results  Labs (all labs ordered are listed, but only abnormal results are displayed) Labs Reviewed  COMPREHENSIVE METABOLIC PANEL - Abnormal; Notable for the following components:      Result Value   Glucose, Bld 153 (*)    BUN 21 (*)    Calcium 8.8 (*)    Total Protein 8.7 (*)    All other components within normal limits  CBC - Abnormal; Notable for the following components:   WBC 11.1 (*)    Platelets 453 (*)    All other components within normal limits  URINALYSIS, ROUTINE W REFLEX MICROSCOPIC - Abnormal; Notable for the following components:   Glucose, UA >=500 (*)    Bacteria, UA RARE (*)    Squamous Epithelial / LPF 0-5 (*)    All other components within normal limits  LIPASE, BLOOD    EKG  EKG Interpretation None       Radiology US Abdomen Complete  Result Date: 11/28/2017 CLINICAL DATA:  Chronic generalized abdominal pain. EXAM: ABDOMEN ULTRASOUND COMPLETE COMPARISON:  CT of the abdomen and pelvis performed 10/05/2017, and abdominal ultrasound performed 08/19/2016 FINDINGS: Gallbladder: No gallstones or wall thickening visualized. No sonographic Murphy sign noted by sonographer. Common bile duct: Diameter: 0.6 cm, within normal limits in caliber. Liver: No focal lesion identified. Within normal limits in parenchymal echogenicity. Portal vein is patent on color Doppler imaging with normal direction of blood flow towards the liver. IVC: No abnormality visualized. Pancreas: Visualized portion unremarkable. Spleen: Size and appearance within normal limits. Right Kidney: Length: 10.9 cm. Echogenicity within normal limits. No mass or hydronephrosis visualized. Left Kidney: Length: 10.5 cm. Echogenicity within normal limits. No mass or hydronephrosis visualized. Abdominal aorta: No  aneurysm visualized. Not well characterized distally due to overlying bowel gas. Other findings: None. IMPRESSION: Unremarkable abdominal ultrasound. Electronically Signed   By: Garald Balding M.D.   On: 11/28/2017 06:53    Procedures Procedures (including critical care time)  Medications Ordered in ED Medications  morphine 4 MG/ML injection 4 mg (not administered)  ondansetron (ZOFRAN) injection 4 mg (not administered)     Initial Impression / Assessment and Plan / ED Course  I have reviewed the triage vital signs and the nursing notes.  Pertinent labs & imaging results that were available during my care of the patient were reviewed by me and considered in my medical decision making (see chart for details).     Patient seen and examined.  Patient with similar presentation of symptoms in January.  Negative CT at that time.  Labs are reassuring.  Current symptoms are atypical for pancreatitis.  Pending lab work.  Will reassess.  Patient discussed with Dr. Ellender Hose.  Vital signs reviewed and are as follows: BP 128/78   Pulse 97   Temp (!) 97.4 F (36.3 C)   Resp (!) 22   LMP 05/08/2016 (Exact Date)   SpO2 98%   7:14 AM patient reexamined and updated on ultrasound results.  Her pain is currently controlled.  Discussed no concerning findings today on workup and imaging.  We will discharged home with symptomatic treatment, pain medication and antiemetic.  Encouraged PCP follow-up for continued evaluation.  Patient is comfortable with this plan.  The patient was urged to return to the Emergency Department immediately with worsening of current symptoms, worsening abdominal pain, persistent vomiting, blood noted in stools, fever, or any other concerns. The patient verbalized understanding.   Patient counseled on use of narcotic pain medications. Counseled not to  combine these medications with others containing tylenol. Urged not to drink alcohol, drive, or perform any other activities that  requires focus while taking these medications. The patient verbalizes understanding and agrees with the plan.   Final Clinical Impressions(s) / ED Diagnoses   Final diagnoses:  Chronic abdominal pain   Patient with recurrent upper abdominal pain, unclear etiology.  No vomiting or diarrhea.  Lack of workup today is largely reassuring.  She has mild leukocytosis.  Normal abdominal ultrasound.  No signs of dilated aorta.  Symptoms are controlled in the emergency department.  Reviewed previous CT from 2 months ago and patient presented with similar symptoms.  Unclear etiology at this time however I do not feel that patient has any emergent findings which are higher surgical consultation, admission in the hospital, or other imaging modality.  Vitals are stable, no fever. No signs of dehydration, patient is tolerating PO's. Lungs are clear and no signs suggestive of PNA. Low concern for appendicitis, cholecystitis, pancreatitis, ruptured viscus, UTI, kidney stone, aortic dissection, aortic aneurysm or other emergent abdominal etiology. Supportive therapy indicated with return if symptoms worsen.    ED Discharge Orders        Ordered    oxyCODONE-acetaminophen (PERCOCET/ROXICET) 5-325 MG tablet  Every 8 hours PRN     11/28/17 0712    ondansetron (ZOFRAN ODT) 4 MG disintegrating tablet  Every 8 hours PRN     11/28/17 0712       Carlisle Cater, PA-C 11/28/17 [id number]    Duffy Bruce, MD 11/28/17 1943

## 2017-11-28 NOTE — ED Triage Notes (Signed)
Pt reports R flank and abdominal pain starting yesterday. She states that it is a sharp pain that has worsened with time. She denies urinary symptoms and denies N/V/D or fever. A&Ox4.

## 2017-12-04 ENCOUNTER — Ambulatory Visit: Payer: BLUE CROSS/BLUE SHIELD | Admitting: Physician Assistant

## 2017-12-18 ENCOUNTER — Other Ambulatory Visit: Payer: Self-pay | Admitting: Physician Assistant

## 2017-12-18 DIAGNOSIS — E785 Hyperlipidemia, unspecified: Secondary | ICD-10-CM

## 2017-12-23 ENCOUNTER — Encounter: Payer: Self-pay | Admitting: Physician Assistant

## 2017-12-24 ENCOUNTER — Ambulatory Visit: Payer: BLUE CROSS/BLUE SHIELD | Admitting: Physician Assistant

## 2017-12-24 ENCOUNTER — Encounter: Payer: Self-pay | Admitting: Physician Assistant

## 2017-12-24 ENCOUNTER — Ambulatory Visit (INDEPENDENT_AMBULATORY_CARE_PROVIDER_SITE_OTHER): Payer: BLUE CROSS/BLUE SHIELD

## 2017-12-24 ENCOUNTER — Other Ambulatory Visit: Payer: Self-pay

## 2017-12-24 VITALS — BP 122/84 | HR 86 | Temp 98.1°F | Ht 63.0 in | Wt 264.6 lb

## 2017-12-24 DIAGNOSIS — M19042 Primary osteoarthritis, left hand: Secondary | ICD-10-CM | POA: Diagnosis not present

## 2017-12-24 DIAGNOSIS — M25641 Stiffness of right hand, not elsewhere classified: Secondary | ICD-10-CM

## 2017-12-24 DIAGNOSIS — R202 Paresthesia of skin: Secondary | ICD-10-CM | POA: Diagnosis not present

## 2017-12-24 DIAGNOSIS — M19041 Primary osteoarthritis, right hand: Secondary | ICD-10-CM | POA: Diagnosis not present

## 2017-12-24 DIAGNOSIS — M25642 Stiffness of left hand, not elsewhere classified: Secondary | ICD-10-CM | POA: Diagnosis not present

## 2017-12-24 DIAGNOSIS — F430 Acute stress reaction: Secondary | ICD-10-CM

## 2017-12-24 DIAGNOSIS — I10 Essential (primary) hypertension: Secondary | ICD-10-CM | POA: Diagnosis not present

## 2017-12-24 DIAGNOSIS — E785 Hyperlipidemia, unspecified: Secondary | ICD-10-CM | POA: Diagnosis not present

## 2017-12-24 MED ORDER — DIAZEPAM 5 MG PO TABS: 5.0000 mg | ORAL_TABLET | Freq: Every day | ORAL | 0 refills | Status: DC | PRN

## 2017-12-24 MED ORDER — LISINOPRIL 5 MG PO TABS: 5.0000 mg | ORAL_TABLET | Freq: Every day | ORAL | 3 refills | Status: AC

## 2017-12-24 MED ORDER — ALPRAZOLAM 0.5 MG PO TABS: 0.5000 mg | ORAL_TABLET | Freq: Every evening | ORAL | 0 refills | Status: DC | PRN

## 2017-12-24 MED ORDER — CHLORTHALIDONE 25 MG PO TABS: ORAL_TABLET | ORAL | 3 refills | Status: AC

## 2017-12-24 MED ORDER — ATORVASTATIN CALCIUM 20 MG PO TABS: ORAL_TABLET | ORAL | 3 refills | Status: AC

## 2017-12-24 MED ORDER — SERTRALINE HCL 100 MG PO TABS: 100.0000 mg | ORAL_TABLET | Freq: Every day | ORAL | 5 refills | Status: DC

## 2017-12-24 NOTE — Progress Notes (Signed)
Patient ID: Emily Mathis, female    DOB: September 06, 1966, 52 y.o.   MRN: [id number]  PCP: Porfirio Oar, PA-C  Chief Complaint  Patient presents with  . Hypertension    BP CHK AND NEEDING REFILLS ON BP AND ANXIETY MEDICATIONS  . Numbness    HAVING TINGLING AND NUMBING FINGERS, MORE BOTHERSOME ON THE MORNINGS    Subjective:   Presents for evaluation of HTN and bilateral hand stiffness.  Noted BP was elevated yesterday. She woke up and felt extremely tired. Was at the pharmacy to pick up her grandson's medicine, and checked her BP on the machine there. Reading was 151/100, pulse 100. BP was normal at a recent ED visit (for abdominal pain).  Her daughter had her baby, by emergent c-section. "We almost lost both of them." "I'm trying to help her. My body just hurts, my back is just aching."  When she awakens, her hands can't make a fist, due to tingling (RIGHT only) and stiffness (BILATERAL). She is RIGHT handed. The stiffness used to resolve after a couple of minutes, but now lasts 20-30 minutes. Tingling persists constantly in the RIGHT, through the entire day. She has to stop and shake her hand when writing, due to the tingling. Caring for her new grandchild.  Has an appointment with rheumatology on 12/30/2017.  Had to reschedule her visit with endocrinology, because her daughter went in to labor when she was to be seen.  Seen in the ED 11/28/2017 with abdominal pain, similar to pain she had previously associated with pancreatitis. That pain resolved.   Review of Systems As above. No CP, SOB, HA, dizziness. No nausea, vomiting, diarrhea. No rash.  Depression screen [facility] 2/9 10/11/2017 09/13/2017 08/15/2017 02/05/2017 11/19/2016  Decreased Interest 1 2 2  0 0  Down, Depressed, Hopeless 1 2 2  0 0  PHQ - 2 Score 2 4 4  0 0  Altered sleeping 0 2 1 - -  Tired, decreased energy 3 3 3  - -  Change in appetite 0 0 0 - -  Feeling bad or failure about yourself  - 2 2 - -  Trouble  concentrating 1 0 0 - -  Moving slowly or fidgety/restless 3 1 0 - -  Suicidal thoughts 0 0 0 - -  PHQ-9 Score 9 12 10  - -  Difficult doing work/chores Somewhat difficult Somewhat difficult Somewhat difficult - -     Patient Active Problem List   Diagnosis Date Noted  . OSA (obstructive sleep apnea) 11/12/2017  . DJD (degenerative joint disease) of knee 09/13/2017  . DDD (degenerative disc disease), lumbar 09/13/2017  . DDD (degenerative disc disease), cervical 09/13/2017  . Abdominal pain   . Chronic pancreatitis (HCC)   . Constipation   . Status post laparoscopic hysterectomy 06/11/2016  . Myalgia 02/15/2016  . Upper airway cough syndrome 10/21/2015  . Polyarthralgia 06/16/2015  . Diabetes mellitus type 2, uncomplicated (HCC) 09/14/2013  . Keloid scar, cervical incision 09/02/2013  . Reaction, situational, acute, to stress 07/02/2013  . 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 BMI 45.0-49.9, adult (HCC) 03/27/2013  . GERD (gastroesophageal reflux disease) 02/15/2013  . Hypocalcemia 01/07/2013  . Chest pain on exertion 07/24/2011     Prior to Admission medications   Medication Sig Start Date End Date Taking? Authorizing Provider  albuterol (PROVENTIL HFA;VENTOLIN HFA) 108 (90 Base) MCG/ACT inhaler Inhale 2 puffs into the lungs every 4 (four) hours as needed for wheezing or shortness of breath (cough, chest tightness). 09/06/17  Leotis Shames, Farrin Shadle, PA-C  ALPRAZolam (XANAX) 0.5 MG tablet Take 1 tablet (0.5 mg total) by mouth at bedtime as needed. for sleep 08/15/17   Porfirio Oar, PA-C  atorvastatin (LIPITOR) 20 MG tablet TAKE 1 TABLET(20 MG) BY MOUTH DAILY 12/18/17   Porfirio Oar, PA-C  celecoxib (CELEBREX) 200 MG capsule TAKE 1 CAPSULE(200 MG) BY MOUTH TWICE DAILY 09/14/17   Porfirio Oar, PA-C  chlorthalidone (HYGROTON) 25 MG tablet TAKE 1 TABLET(25 MG) BY MOUTH DAILY 09/05/16   Porfirio Oar, PA-C  Cholecalciferol (VITAMIN D3) 50000 units CAPS Take 50,000 Units by  mouth every Monday.  06/14/16   [provider]  cyclobenzaprine (FLEXERIL) 10 MG tablet Take 1 tablet (10 mg total) by mouth 3 (three) times daily as needed for muscle spasms. 11/12/16   Porfirio Oar, PA-C  diazepam (VALIUM) 5 MG tablet Take 1 tablet (5 mg total) by mouth daily as needed. Patient taking differently: Take 5 mg by mouth daily as needed for anxiety.  08/15/17   Porfirio Oar, PA-C  diclofenac (VOLTAREN) 75 MG EC tablet Take 1 tablet twice daily as needed for pain. 08/15/17   [provider]  docusate sodium (COLACE) 250 MG capsule Take 1 capsule (250 mg total) by mouth daily. Patient taking differently: Take by mouth.  09/06/16   Porfirio Oar, PA-C  ferrous sulfate 325 (65 FE) MG tablet Take 325 mg by mouth 3 (three) times daily with meals.    [provider]  glycerin adult 2 g suppository Place 1 suppository rectally as needed for constipation.    [provider]  INVOKAMET 150-500 MG TABS TAKE 1 TABLET BY MOUTH DAILY 09/14/17   Porfirio Oar, PA-C  levothyroxine (SYNTHROID, LEVOTHROID) 300 MCG tablet Take 1 tablet (300 mcg total) by mouth daily. 06/17/15   Marilou Barnfield, PA-C  lisinopril (PRINIVIL,ZESTRIL) 5 MG tablet Take 1 tablet (5 mg total) by mouth daily. 11/12/16   Porfirio Oar, PA-C  nystatin (MYCOSTATIN) powder Apply topically 4 (four) times daily. Patient taking differently: Apply topically 4 (four) times daily as needed (For heat rash.).  10/02/15   Porfirio Oar, PA-C  ondansetron (ZOFRAN ODT) 4 MG disintegrating tablet Take 1 tablet (4 mg total) by mouth every 8 (eight) hours as needed for nausea or vomiting. Patient not taking: Reported on 12/24/2017 11/28/17   Renne Crigler, PA-C  oxyCODONE-acetaminophen (PERCOCET/ROXICET) 5-325 MG tablet Take 1 tablet by mouth every 8 (eight) hours as needed for severe pain. Patient not taking: Reported on 12/24/2017 11/28/17   Renne Crigler, PA-C  polyethylene glycol (MIRALAX / GLYCOLAX)  packet Take 17 g by mouth 2 (two) times daily. Take 2 packets today (day one), wait 2 hours, if no stool produced then take 2 more packets on day one; then continue taking 2 packets daily until you achieve daily soft stools; if water stool develops then cut back to 1 packet daily. Patient taking differently: Take 17 g by mouth 3 (three) times daily.  07/24/16   Street, [place], PA-C  potassium chloride SA (K-DUR,KLOR-CON) 20 MEQ tablet Take 1 tablet (20 mEq total) by mouth 2 (two) times daily. 06/12/16   Patton Salles, MD  ranitidine (ZANTAC) 150 MG tablet Take 1 tablet (150 mg total) by mouth 2 (two) times daily. 02/05/17   Porfirio Oar, PA-C  senna (SENOKOT) 8.6 MG TABS tablet Take 1 tablet (8.6 mg total) by mouth daily as needed for mild constipation. 08/22/16   Mikell, Antionette Poles, MD  sertraline (ZOLOFT) 100 MG tablet Take  1 tablet (100 mg total) by mouth daily. 08/15/17   Porfirio Oar, PA-C  sucralfate (CARAFATE) 1 G tablet Take 1 tablet (1 g total) by mouth 4 (four) times daily. 04/19/15   Marisa Severin, MD     Allergies  Allergen Reactions  . Dilaudid [Hydromorphone Hcl] Itching  . Hydrocodone Itching  . Latex Itching and Other (See Comments)    leaves marks on skin       Objective:  Physical Exam  Constitutional: She is oriented to person, place, and time. She appears well-developed and well-nourished. She is active and cooperative. No distress.  BP 122/84 (BP Location: Left Arm, Patient Position: Sitting, Cuff Size: Large)   Pulse 86   Temp 98.1 F (36.7 C) (Oral)   Ht 5\' 3"  (1.6 m)   Wt 264 lb 9.6 oz (120 kg)   LMP 05/08/2016 (Exact Date)   SpO2 98%   BMI 46.87 kg/m   HENT:  Head: Normocephalic and atraumatic.  Right Ear: Hearing normal.  Left Ear: Hearing normal.  Eyes: Conjunctivae are normal. No scleral icterus.  Neck: Normal range of motion. Neck supple. No thyromegaly present.  Cardiovascular: Normal rate, regular rhythm and normal heart sounds.    Pulses:      Radial pulses are 2+ on the right side, and 2+ on the left side.  Pulmonary/Chest: Effort normal and breath sounds normal.  Musculoskeletal:       Right elbow: Normal.      Left elbow: Normal.       Right wrist: Normal.       Left wrist: Normal.       Right forearm: Normal.       Left forearm: Normal.       Right hand: Normal. Normal sensation noted. Normal strength noted.       Left hand: Normal. Normal sensation noted. Normal strength noted.  Lymphadenopathy:       Head (right side): No tonsillar, no preauricular, no posterior auricular and no occipital adenopathy present.       Head (left side): No tonsillar, no preauricular, no posterior auricular and no occipital adenopathy present.    She has no cervical adenopathy.       Right: No supraclavicular adenopathy present.       Left: No supraclavicular adenopathy present.  Neurological: She is alert and oriented to person, place, and time. She has normal strength. No sensory deficit.  Phalen's and Tinel's testing exacerbates symptoms on the RIGHT, does NOT cause symptoms on the LEFT.  Skin: Skin is warm, dry and intact. No rash noted. No cyanosis or erythema. Nails show no clubbing.  Psychiatric: Her speech is normal and behavior is normal. Judgment and thought content normal. Her mood appears anxious. Her affect is not angry, not blunt, not labile and not inappropriate. Cognition and memory are normal. She exhibits a depressed mood.    Wt Readings from Last 3 Encounters:  12/24/17 264 lb 9.6 oz (120 kg)  11/12/17 274 lb 3.2 oz (124.4 kg)  10/11/17 273 lb 12.8 oz (124.2 kg)       Assessment & Plan:   Problem List Items Addressed This Visit    Reaction, situational, acute, to stress    Aggravated by recent stress. No change in current treatment. Proceed with specialty evaluations.      Relevant Medications   ALPRAZolam (XANAX) 0.5 MG tablet   sertraline (ZOLOFT) 100 MG tablet   diazepam (VALIUM) 5 MG tablet     Other Visit  Diagnoses    Paresthesias in right hand    -  Primary   Likely CTS, but also known c-spine DDD. Start night splint.   Relevant Orders   Rheumatoid factor   ANA   Sedimentation rate   DG Hand Complete Right (Completed)   Splint wrist (Completed)   Stiffness of left hand joint       Await labs and radiographs. Has rheumatology appointment 4/15.   Relevant Orders   DG Hand Complete Left (Completed)   Stiffness of right hand joint       Await labs and radiographs. Has rheumatology appointment 4/15.   Relevant Orders   DG Hand Complete Right (Completed)   Hyperlipidemia, unspecified hyperlipidemia type       Relevant Medications   chlorthalidone (HYGROTON) 25 MG tablet   atorvastatin (LIPITOR) 20 MG tablet   lisinopril (PRINIVIL,ZESTRIL) 5 MG tablet   Essential hypertension       Relevant Medications   chlorthalidone (HYGROTON) 25 MG tablet   atorvastatin (LIPITOR) 20 MG tablet   lisinopril (PRINIVIL,ZESTRIL) 5 MG tablet       Return in about 3 months (around 03/25/2018) for re-evaluation of blood pressure.   Fernande Bras, PA-C [facility]

## 2017-12-24 NOTE — Assessment & Plan Note (Addendum)
Aggravated by recent stress. No change in current treatment. Proceed with specialty evaluations.

## 2017-12-24 NOTE — Patient Instructions (Signed)
     IF you received an x-ray today, you will receive an invoice from [facility]. Please contact [facility] at [phone] with questions or concerns regarding your invoice.   IF you received labwork today, you will receive an invoice from [facility]. Please contact [facility] at [phone] with questions or concerns regarding your invoice.   Our billing staff will not be able to assist you with questions regarding bills from these companies.  You will be contacted with the lab results as soon as they are available. The fastest way to get your results is to activate your My Chart account. Instructions are located on the last page of this paperwork. If you have not heard from us regarding the results in 2 weeks, please contact this office.     

## 2017-12-25 LAB — SEDIMENTATION RATE: Sed Rate: 71 mm/hr — ABNORMAL HIGH (ref 0–40)

## 2017-12-25 LAB — ANA: Anti Nuclear Antibody(ANA): NEGATIVE

## 2017-12-25 LAB — RHEUMATOID FACTOR: Rhuematoid fact SerPl-aCnc: <10 IU/mL (ref 0.0–13.9)

## 2017-12-30 DIAGNOSIS — M797 Fibromyalgia: Secondary | ICD-10-CM | POA: Diagnosis not present

## 2017-12-30 DIAGNOSIS — M2550 Pain in unspecified joint: Secondary | ICD-10-CM | POA: Diagnosis not present

## 2017-12-30 DIAGNOSIS — M5136 Other intervertebral disc degeneration, lumbar region: Secondary | ICD-10-CM | POA: Diagnosis not present

## 2017-12-30 DIAGNOSIS — M7062 Trochanteric bursitis, left hip: Secondary | ICD-10-CM | POA: Diagnosis not present

## 2017-12-30 DIAGNOSIS — M7989 Other specified soft tissue disorders: Secondary | ICD-10-CM | POA: Diagnosis not present

## 2017-12-30 DIAGNOSIS — M150 Primary generalized (osteo)arthritis: Secondary | ICD-10-CM | POA: Diagnosis not present

## 2018-01-01 ENCOUNTER — Encounter: Payer: Self-pay | Admitting: Emergency Medicine

## 2018-01-01 ENCOUNTER — Ambulatory Visit: Payer: Self-pay | Admitting: *Deleted

## 2018-01-01 ENCOUNTER — Other Ambulatory Visit: Payer: Self-pay

## 2018-01-01 ENCOUNTER — Ambulatory Visit: Payer: BLUE CROSS/BLUE SHIELD | Admitting: Emergency Medicine

## 2018-01-01 VITALS — BP 126/78 | HR 95 | Temp 98.9°F | Resp 16 | Ht 63.25 in | Wt 271.4 lb

## 2018-01-01 DIAGNOSIS — I10 Essential (primary) hypertension: Secondary | ICD-10-CM

## 2018-01-01 DIAGNOSIS — M79674 Pain in right toe(s): Secondary | ICD-10-CM | POA: Diagnosis not present

## 2018-01-01 DIAGNOSIS — E119 Type 2 diabetes mellitus without complications: Secondary | ICD-10-CM | POA: Diagnosis not present

## 2018-01-01 DIAGNOSIS — M109 Gout, unspecified: Secondary | ICD-10-CM | POA: Diagnosis not present

## 2018-01-01 MED ORDER — HYDROCODONE-ACETAMINOPHEN 5-325 MG PO TABS: 1.0000 | ORAL_TABLET | Freq: Four times a day (QID) | ORAL | 0 refills | Status: DC | PRN

## 2018-01-01 MED ORDER — INDOMETHACIN 50 MG PO CAPS: 50.0000 mg | ORAL_CAPSULE | Freq: Three times a day (TID) | ORAL | 0 refills | Status: AC

## 2018-01-01 NOTE — Patient Instructions (Addendum)
   IF you received an x-ray today, you will receive an invoice from [facility]. Please contact [facility] at [phone] with questions or concerns regarding your invoice.   IF you received labwork today, you will receive an invoice from [facility]. Please contact [facility] at [phone] with questions or concerns regarding your invoice.   Our billing staff will not be able to assist you with questions regarding bills from these companies.  You will be contacted with the lab results as soon as they are available. The fastest way to get your results is to activate your My Chart account. Instructions are located on the last page of this paperwork. If you have not heard from us regarding the results in 2 weeks, please contact this office.      Gout Gout is painful swelling that can happen in some of your joints. Gout is a type of arthritis. This condition is caused by having too much uric acid in your body. Uric acid is a chemical that is made when your body breaks down substances called purines. If your body has too much uric acid, sharp crystals can form and build up in your joints. This causes pain and swelling. Gout attacks can happen quickly and be very painful (acute gout). Over time, the attacks can affect more joints and happen more often (chronic gout). Follow these instructions at home: During a Gout Attack  If directed, put ice on the painful area: ? Put ice in a plastic bag. ? Place a towel between your skin and the bag. ? Leave the ice on for 20 minutes, 2-3 times a day.  Rest the joint as much as possible. If the joint is in your leg, you may be given crutches to use.  Raise (elevate) the painful joint above the level of your heart as often as you can.  Drink enough fluids to keep your pee (urine) clear or pale yellow.  Take over-the-counter and prescription medicines only as told by your doctor.  Do not drive or use heavy machinery while taking  prescription pain medicine.  Follow instructions from your doctor about what you can or cannot eat and drink.  Return to your normal activities as told by your doctor. Ask your doctor what activities are safe for you. Avoiding Future Gout Attacks  Follow a low-purine diet as told by a specialist (dietitian) or your doctor. Avoid foods and drinks that have a lot of purines, such as: ? Liver. ? Kidney. ? Anchovies. ? Asparagus. ? Herring. ? Mushrooms ? Mussels. ? Beer.  Limit alcohol intake to no more than 1 drink a day for nonpregnant women and 2 drinks a day for men. One drink equals 12 oz of beer, 5 oz of wine, or 1 oz of hard liquor.  Stay at a healthy weight or lose weight if you are overweight. If you want to lose weight, talk with your doctor. It is important that you do not lose weight too fast.  Start or continue an exercise plan as told by your doctor.  Drink enough fluids to keep your pee clear or pale yellow.  Take over-the-counter and prescription medicines only as told by your doctor.  Keep all follow-up visits as told by your doctor. This is important. Contact a doctor if:  You have another gout attack.  You still have symptoms of a gout attack after10 days of treatment.  You have problems (side effects) because of your medicines.  You have chills or a fever.    You have burning pain when you pee (urinate).  You have pain in your lower back or belly. Get help right away if:  You have very bad pain.  Your pain cannot be controlled.  You cannot pee. This information is not intended to replace advice given to you by your health care provider. Make sure you discuss any questions you have with your health care provider. Document Released: 06/12/2008 Document Revised: 02/09/2016 Document Reviewed: 06/16/2015 Elsevier Interactive Patient Education  2018 [employer]  

## 2018-01-01 NOTE — Telephone Encounter (Signed)
Pt called with pain in her right big toe. States foot and toe is swollen. The pain goes from her big toe up her leg. She denies fever. The left side of her foot near her toe is also red. She is wearing flip flops right now because she can't wear her other shoes.  Appointment made per protocol for late afternoon. Pt advised to call back for any worsening symptoms. She voiced understanding. Reason for Disposition . [1] SEVERE pain (e.g., excruciating, unable to do any normal activities) AND [2] not improved after 2 hours of pain medicine  Answer Assessment - Initial Assessment Questions 1. ONSET: "When did the pain start?"      Yesterday  2. LOCATION: "Where is the pain located?"   (e.g., around nail, entire toe, at foot joint)      Right big toe 3. PAIN: "How bad is the pain?"    (Scale 1-10; or mild, moderate, severe)   -  MILD (1-3): doesn't interfere with normal activities    -  MODERATE (4-7): interferes with normal activities (e.g., work or school) or awakens from sleep, limping    -  SEVERE (8-10): excruciating pain, unable to do any normal activities, unable to walk     Pain #10 4. APPEARANCE: "What does the toe look like?" (e.g., redness, swelling, bruising, pallor)     Swelling,  5. CAUSE: "What do you think is causing the toe pain?"     Not sure 6. OTHER SYMPTOMS: "Do you have any other symptoms?" (e.g., leg pain, rash, fever, numbness)     Leg pain, gets numb at times and tingles 7. PREGNANCY: "Is there any chance you are pregnant?" "When was your last menstrual period?"    Has had a hysterectomy  Protocols used: TOE PAIN-A-AH

## 2018-01-01 NOTE — Progress Notes (Signed)
Emily Mathis 52 y.o.   Chief Complaint  Patient presents with  . Toe Pain    RIGHT Great x 3 days with tingling up the leg    HISTORY OF PRESENT ILLNESS: This is a 52 y.o. female complaining of pain to the right great toe that started 2 days ago.  Denies injury.  No history of gout.  Pain is sharp, constant, and severe with radiation up the foot into the ankle area.  Hurts to walk on it.  Diabetic, hypertensive.  HPI   Prior to Admission medications   Medication Sig Start Date End Date Taking? Authorizing Provider  albuterol (PROVENTIL HFA;VENTOLIN HFA) 108 (90 Base) MCG/ACT inhaler Inhale 2 puffs into the lungs every 4 (four) hours as needed for wheezing or shortness of breath (cough, chest tightness). 09/06/17  Yes Jeffery, [name], PA-C  ALPRAZolam (XANAX) 0.5 MG tablet Take 1 tablet (0.5 mg total) by mouth at bedtime as needed. for sleep 12/24/17  Yes Jeffery, [name], PA-C  atorvastatin (LIPITOR) 20 MG tablet TAKE 1 TABLET(20 MG) BY MOUTH DAILY 12/24/17  Yes Jeffery, [name], PA-C  celecoxib (CELEBREX) 200 MG capsule TAKE 1 CAPSULE(200 MG) BY MOUTH TWICE DAILY 09/14/17  Yes Jeffery, [name], PA-C  chlorthalidone (HYGROTON) 25 MG tablet TAKE 1 TABLET(25 MG) BY MOUTH DAILY 12/24/17  Yes Harrison Mons, PA-C  Cholecalciferol (VITAMIN D3) 50000 units CAPS Take 50,000 Units by mouth every Monday.  06/14/16  Yes [provider]  cyclobenzaprine (FLEXERIL) 10 MG tablet Take 1 tablet (10 mg total) by mouth 3 (three) times daily as needed for muscle spasms. 11/12/16  Yes Jeffery, [name], PA-C  diazepam (VALIUM) 5 MG tablet Take 1 tablet (5 mg total) by mouth daily as needed for muscle spasms. 12/24/17  Yes Jeffery, [name], PA-C  diclofenac (VOLTAREN) 75 MG EC tablet Take 1 tablet twice daily as needed for pain. 08/15/17  Yes [provider]  docusate sodium (COLACE) 250 MG capsule Take 1 capsule (250 mg total) by mouth daily. Patient taking differently: Take by mouth.  09/06/16  Yes  Jeffery, [name], PA-C  ferrous sulfate 325 (65 FE) MG tablet Take 325 mg by mouth 3 (three) times daily with meals.   Yes [provider]  glycerin adult 2 g suppository Place 1 suppository rectally as needed for constipation.   Yes [provider]  INVOKAMET 150-500 MG TABS TAKE 1 TABLET BY MOUTH DAILY 09/14/17  Yes Jeffery, [name], PA-C  levothyroxine (SYNTHROID, LEVOTHROID) 300 MCG tablet Take 1 tablet (300 mcg total) by mouth daily. 06/17/15  Yes Jeffery, [name], PA-C  lisinopril (PRINIVIL,ZESTRIL) 5 MG tablet Take 1 tablet (5 mg total) by mouth daily. 12/24/17  Yes Jeffery, [name], PA-C  nystatin (MYCOSTATIN) powder Apply topically 4 (four) times daily. Patient taking differently: Apply topically 4 (four) times daily as needed (For heat rash.).  10/02/15  Yes Jeffery, [name], PA-C  polyethylene glycol (MIRALAX / GLYCOLAX) packet Take 17 g by mouth 2 (two) times daily. Take 2 packets today (day one), wait 2 hours, if no stool produced then take 2 more packets on day one; then continue taking 2 packets daily until you achieve daily soft stools; if water stool develops then cut back to 1 packet daily. Patient taking differently: Take 17 g by mouth 3 (three) times daily.  07/24/16  Yes Street, [place], PA-C  potassium chloride SA (K-DUR,KLOR-CON) 20 MEQ tablet Take 1 tablet (20 mEq total) by mouth 2 (two) times daily. 06/12/16  Yes [name], MD  ranitidine (ZANTAC) 150 MG tablet Take 1 tablet (150 mg total) by mouth 2 (two) times daily. 02/05/17  Yes Jeffery, [name], PA-C  senna (SENOKOT) 8.6 MG TABS tablet Take 1 tablet (8.6 mg total) by mouth daily as needed for mild constipation. 08/22/16  Yes Mikell, Jeani Sow, MD  sertraline (ZOLOFT) 100 MG tablet Take 1 tablet (100 mg total) by mouth daily. 12/24/17  Yes Jeffery, [name], PA-C  sucralfate (CARAFATE) 1 G tablet Take 1 tablet (1 g total) by mouth 4 (four) times daily. 04/19/15  Yes Linton Flemings, MD  ondansetron (ZOFRAN  ODT) 4 MG disintegrating tablet Take 1 tablet (4 mg total) by mouth every 8 (eight) hours as needed for nausea or vomiting. Patient not taking: Reported on 12/24/2017 11/28/17   Carlisle Cater, PA-C    Allergies  Allergen Reactions  . Dilaudid [Hydromorphone Hcl] Itching  . Hydrocodone Itching  . Latex Itching and Other (See Comments)    leaves marks on skin    Patient Active Problem List   Diagnosis Date Noted  . OSA (obstructive sleep apnea) 11/12/2017  . DJD (degenerative joint disease) of knee 09/13/2017  . DDD (degenerative disc disease), lumbar 09/13/2017  . DDD (degenerative disc disease), cervical 09/13/2017  . Abdominal pain   . Chronic pancreatitis ([place])   . Constipation   . Status post laparoscopic hysterectomy 06/11/2016  . Myalgia 02/15/2016  . Upper airway cough syndrome 10/21/2015  . Polyarthralgia 06/16/2015  . Diabetes mellitus type 2, uncomplicated ([place]) 32/20/2542  . Keloid scar, cervical incision 09/02/2013  . Reaction, situational, acute, to stress 07/02/2013  . 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 BMI 45.0-49.9, adult ([place]) 03/27/2013  . GERD (gastroesophageal reflux disease) 02/15/2013  . Hypocalcemia 01/07/2013  . Chest pain on exertion 07/24/2011    Past Medical History:  Diagnosis Date  . Abnormal Pap smear of cervix 1990   Hx cryotherapy to cervix  . Abnormal uterine bleeding   . Anemia 02/20/16   Transfusion of 1 unit pRBCs  . Anxiety   . Anxiety and depression   . Arthritis   . Asthma   . 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 Chest pain    per pt due to anemia-   . Diabetes mellitus without complication ([place])   . Fibroid   . Generalized pain   . GERD (gastroesophageal reflux disease)   . Headache(784.0)   . History of blood transfusion 02/20/2016  . Hyperlipidemia   . Hypertension    per Dr. Irven Shelling note 08/30/2011  . Hypothyroidism   . Iron deficiency anemia 02/15/2016  . Leg swelling    bilateral  . Mild scoliosis  08/06/2017  . Neuromuscular disorder ([place])   . Shortness of breath dyspnea    on exertion due to anemia  . Sleep apnea   . Thyroid disease    Hx 3 goiters--hx thyroidectomy--sees Dr. Forde Dandy    Past Surgical History:  Procedure Laterality Date  . CARDIAC CATHETERIZATION  2013  . CESAREAN SECTION  1995  . CESAREAN SECTION  1996  . COLONOSCOPY    . CYSTOSCOPY N/A 06/11/2016   Procedure: CYSTOSCOPY;  Surgeon: [name], MD;  Location: [facility];  Service: Gynecology;  Laterality: N/A;  . HYSTEROSCOPY W/D&C N/A 04/10/2016   Procedure: HYSTEROSCOPY WITH fractional dilatation and curretage;  Surgeon: [name], MD;  Location: [facility];  Service: Gynecology;  Laterality: N/A;  . LEFT HEART CATHETERIZATION WITH CORONARY ANGIOGRAM  07/24/2011   Procedure: LEFT  HEART CATHETERIZATION WITH CORONARY ANGIOGRAM;  Surgeon: Laverda Page, MD;  Location: [facility] C[facility] LAB;  Service: Cardiovascular;;  . ROBOTIC ASSISTED TOTAL HYSTERECTOMY WITH SALPINGECTOMY Bilateral 06/11/2016   Procedure: ROBOTIC ASSISTED TOTAL HYSTERECTOMY WITH SALPINGECTOMY;  Surgeon: [name], MD;  Location: [facility];  Service: Gynecology;  Laterality: Bilateral;  . THYROIDECTOMY N/A 12/18/2012   Procedure: TOTAL THYROIDECTOMY;  Surgeon: Earnstine Regal, MD;  Location: [facility];  Service: General;  Laterality: N/A;  . UPPER GI ENDOSCOPY      Social History   Socioeconomic History  . Marital status: Single    Spouse name: n/a  . Number of children: 3  . Years of education: Assoc  . Highest education level: Associate degree: academic program  Occupational History  . Occupation: disabled    Comment: childcare  Social Needs  . Financial resource strain: Hard  . Food insecurity:    Worry: Not on file    Inability: Not on file  . Transportation needs:    Medical: No    Non-medical: Yes  Tobacco Use  . Smoking status: Never Smoker  . Smokeless tobacco: Never Used  Substance and Sexual Activity    . Alcohol use: Yes    Alcohol/week: 0.0 oz    Frequency: Never    Comment: Occasional glass of wine  . Drug use: No  . Sexual activity: Not Currently    Partners: Male    Birth control/protection: Abstinence, Surgical    Comment: Hyst  Lifestyle  . Physical activity:    Days per week: Not on file    Minutes per session: Not on file  . Stress: Very much  Relationships  . Social connections:    Talks on phone: Not on file    Gets together: Not on file    Attends religious service: Not on file    Active member of club or organization: Not on file    Attends meetings of clubs or organizations: Not on file    Relationship status: Not on file  . Intimate partner violence:    Fear of current or ex partner: Not on file    Emotionally abused: Not on file    Physically abused: Not on file    Forced sexual activity: Not on file  Other Topics Concern  . Not on file  Social History Narrative   Lives with 2 of her daughters and grandson.   Another daughter is married and lives nearby, both she and her husband are disabled and rely on support from the patient.   Her son lives with his father, who moved out 04/2015, nearby.   Caffeine intake varies     Family History  Problem Relation Age of Onset  . Heart disease Mother   . Stroke Mother   . Hypertension Mother   . Heart disease Father   . Colon cancer Father   . Hypertension Father   . Diabetes Father   . Cancer Paternal Grandmother   . Asthma Daughter   . Seizures Daughter   . Hypertension Sister   . Thyroid disease Sister   . Mental illness Daughter 11       suicide attempt 06/2013  . Asthma Sister   . Hypertension Sister   . Breast cancer Paternal Aunt   . Cancer Paternal Aunt      Review of Systems  Constitutional: Negative.  Negative for chills and fever.  HENT: Negative.  Negative for sore throat.   Eyes: Negative.  Respiratory: Negative.  Negative for cough and shortness of breath.   Cardiovascular:  Negative.  Negative for chest pain and palpitations.  Gastrointestinal: Negative for abdominal pain, diarrhea, nausea and vomiting.  Genitourinary: Negative.  Negative for hematuria.  Musculoskeletal: Positive for joint pain (Right big toe).  Skin: Negative for rash.  Neurological: Negative.  Negative for dizziness, sensory change, focal weakness and headaches.  Endo/Heme/Allergies: Negative.   All other systems reviewed and are negative.   Vitals:   01/01/18 1733  BP: 126/78  Pulse: 95  Resp: 16  Temp: 98.9 F (37.2 C)  SpO2: 98%    Physical Exam  Constitutional: She is oriented to person, place, and time. She appears well-developed.  Obese.  HENT:  Head: Normocephalic and atraumatic.  Eyes: Pupils are equal, round, and reactive to light. EOM are normal.  Neck: Normal range of motion. Neck supple.  Cardiovascular: Normal rate, regular rhythm and intact distal pulses.  Pulmonary/Chest: Effort normal and breath sounds normal.  Musculoskeletal:  Right big toe: Positive swelling and erythema at the MTP joint.  Very tender to touch and with range of motion.  Neurological: She is alert and oriented to person, place, and time. No sensory deficit. She exhibits normal muscle tone.  Skin: Skin is warm and dry. Capillary refill takes less than 2 seconds.  Psychiatric: She has a normal mood and affect. Her behavior is normal.    A total of 25 minutes was spent in the room with the patient, greater than 50% of which was in counseling/coordination of care regarding gout, diet, and treatment.  ASSESSMENT & PLAN: Eilah was seen today for toe pain.  Diagnoses and all orders for this visit:  Acute gouty arthritis  Pain of toe of right foot  Essential hypertension  Type 2 diabetes mellitus without complication, without long-term current use of insulin ([place])  Other orders -     HYDROcodone-acetaminophen (NORCO) 5-325 MG tablet; Take 1 tablet by mouth every 6 (six) hours as needed for  moderate pain. -     indomethacin (INDOCIN) 50 MG capsule; Take 1 capsule (50 mg total) by mouth 3 (three) times daily with meals for 3 days.    Patient Instructions       IF you received an x-ray today, you will receive an invoice from [facility]. Please contact [facility] at [phone] with questions or concerns regarding your invoice.   IF you received labwork today, you will receive an invoice from [facility]. Please contact [facility] at [phone] with questions or concerns regarding your invoice.   Our billing staff will not be able to assist you with questions regarding bills from these companies.  You will be contacted with the lab results as soon as they are available. The fastest way to get your results is to activate your My Chart account. Instructions are located on the last page of this paperwork. If you have not heard from [place] regarding the results in 2 weeks, please contact this office.     Gout Gout is painful swelling that can happen in some of your joints. Gout is a type of arthritis. This condition is caused by having too much uric acid in your body. Uric acid is a chemical that is made when your body breaks down substances called purines. If your body has too much uric acid, sharp crystals can form and build up in your joints. This causes pain and swelling. Gout attacks can happen quickly and be very painful (acute gout). Over time,  the attacks can affect more joints and happen more often (chronic gout). Follow these instructions at home: During a Gout Attack  If directed, put ice on the painful area: ? Put ice in a plastic bag. ? Place a towel between your skin and the bag. ? Leave the ice on for 20 minutes, 2-3 times a day.  Rest the joint as much as possible. If the joint is in your leg, you may be given crutches to use.  Raise (elevate) the painful joint above the level of your heart as often as you can.  Drink enough fluids to keep your  pee (urine) clear or pale yellow.  Take over-the-counter and prescription medicines only as told by your doctor.  Do not drive or use heavy machinery while taking prescription pain medicine.  Follow instructions from your doctor about what you can or cannot eat and drink.  Return to your normal activities as told by your doctor. Ask your doctor what activities are safe for you. Avoiding Future Gout Attacks  Follow a low-purine diet as told by a specialist (dietitian) or your doctor. Avoid foods and drinks that have a lot of purines, such as: ? Liver. ? Kidney. ? Anchovies. ? Asparagus. ? Herring. ? Mushrooms ? Mussels. ? Beer.  Limit alcohol intake to no more than 1 drink a day for nonpregnant women and 2 drinks a day for men. One drink equals 12 oz of beer, 5 oz of wine, or 1 oz of hard liquor.  Stay at a healthy weight or lose weight if you are overweight. If you want to lose weight, talk with your doctor. It is important that you do not lose weight too fast.  Start or continue an exercise plan as told by your doctor.  Drink enough fluids to keep your pee clear or pale yellow.  Take over-the-counter and prescription medicines only as told by your doctor.  Keep all follow-up visits as told by your doctor. This is important. Contact a doctor if:  You have another gout attack.  You still have symptoms of a gout attack after10 days of treatment.  You have problems (side effects) because of your medicines.  You have chills or a fever.  You have burning pain when you pee (urinate).  You have pain in your lower back or belly. Get help right away if:  You have very bad pain.  Your pain cannot be controlled.  You cannot pee. This information is not intended to replace advice given to you by your health care provider. Make sure you discuss any questions you have with your health care provider. Document Released: 06/12/2008 Document Revised: 02/09/2016 Document Reviewed:  06/16/2015 Elsevier Interactive Patient Education  2018 Elsevier Inc.      Agustina Caroli, MD [facility]

## 2018-01-07 ENCOUNTER — Encounter: Payer: Self-pay | Admitting: Physician Assistant

## 2018-01-08 ENCOUNTER — Encounter: Payer: Self-pay | Admitting: Physician Assistant

## 2018-01-09 DIAGNOSIS — Z0271 Encounter for disability determination: Secondary | ICD-10-CM

## 2018-02-19 ENCOUNTER — Other Ambulatory Visit: Payer: Self-pay | Admitting: Physician Assistant

## 2018-02-19 DIAGNOSIS — F430 Acute stress reaction: Secondary | ICD-10-CM

## 2018-02-20 NOTE — Telephone Encounter (Signed)
Xanax 0.5 mg and Valium 5 mg refill requests  LOV 12/24/17 with Harrison Mons  Last refill for Xanax 12/24/17  #30  0 refill                      Valium 12/24/17  #30  0 refill  [id number] - [place], [place] - [address]

## 2018-02-22 ENCOUNTER — Other Ambulatory Visit: Payer: Self-pay | Admitting: Physician Assistant

## 2018-02-22 DIAGNOSIS — M7712 Lateral epicondylitis, left elbow: Secondary | ICD-10-CM

## 2018-02-22 DIAGNOSIS — R202 Paresthesia of skin: Secondary | ICD-10-CM

## 2018-02-22 DIAGNOSIS — M791 Myalgia, unspecified site: Secondary | ICD-10-CM

## 2018-02-22 DIAGNOSIS — E119 Type 2 diabetes mellitus without complications: Secondary | ICD-10-CM

## 2018-02-26 MED ORDER — CANAGLIFLOZIN-METFORMIN HCL 150-500 MG PO TABS
1.0000 | ORAL_TABLET | Freq: Every day | ORAL | 0 refills | Status: AC
Start: 2018-02-26 — End: ?

## 2018-02-26 MED ORDER — CELECOXIB 200 MG PO CAPS: 200.0000 mg | ORAL_CAPSULE | Freq: Every day | ORAL | 2 refills | Status: AC

## 2018-02-26 NOTE — Telephone Encounter (Signed)
Rx sent electronically.  Meds ordered this encounter  Medications  . ALPRAZolam (XANAX) 0.5 MG tablet    Sig: TAKE 1 TABLET(0.5 MG) BY MOUTH AT BEDTIME AS NEEDED FOR SLEEP    Dispense:  30 tablet    Refill:  0  . diazepam (VALIUM) 5 MG tablet    Sig: TAKE 1 TABLET(5 MG) BY MOUTH DAILY AS NEEDED FOR MUSCLE SPASMS    Dispense:  30 tablet    Refill:  0    Please advise patient that I have sent the prescriptions, and that I will need to see her at [facility], or she will need to establish with one of my colleagues here, for the next prescriptions.

## 2018-02-26 NOTE — Progress Notes (Deleted)
52 y.o. [id number] Single African American female here for annual exam.    PCP:     Patient's last menstrual period was 05/08/2016 (exact date).           Sexually active: {yes no:314532}  The current method of family planning is status post hysterectomy.    Exercising: {yes no:314532}  {types:19826} Smoker:  no  Health Maintenance: Pap:   03/12/16 Neg, Neg HR HPV History of abnormal Pap:  Yes, years ago MMG:  04/15/17 BIRADS 1 negative/density b Colonoscopy:  *** BMD:   ***  Result  *** TDaP:  *** Gardasil:   {YES NO:22349} HIV: Hep C: Screening Labs:  Hb today: ***, Urine today: ***   reports that she has never smoked. She has never used smokeless tobacco. She reports that she drinks alcohol. She reports that she does not use drugs.  Past Medical History:  Diagnosis Date  . Abnormal Pap smear of cervix 1990   Hx cryotherapy to cervix  . Abnormal uterine bleeding   . Anemia 02/20/16   Transfusion of 1 unit pRBCs  . Anxiety   . Anxiety and depression   . Arthritis   . Asthma   . 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 Chest pain    per pt due to anemia-   . Diabetes mellitus without complication ([place])   . Fibroid   . Generalized pain   . GERD (gastroesophageal reflux disease)   . Headache(784.0)   . History of blood transfusion 02/20/2016  . Hyperlipidemia   . Hypertension    per Dr. Irven Shelling note 08/30/2011  . Hypothyroidism   . Iron deficiency anemia 02/15/2016  . Leg swelling    bilateral  . Mild scoliosis 08/06/2017  . Neuromuscular disorder ([place])   . Shortness of breath dyspnea    on exertion due to anemia  . Sleep apnea   . Thyroid disease    Hx 3 goiters--hx thyroidectomy--sees Dr. Forde Dandy    Past Surgical History:  Procedure Laterality Date  . CARDIAC CATHETERIZATION  2013  . CESAREAN SECTION  1995  . CESAREAN SECTION  1996  . COLONOSCOPY    . CYSTOSCOPY N/A 06/11/2016   Procedure: CYSTOSCOPY;  Surgeon: [name], MD;  Location: [facility];   Service: Gynecology;  Laterality: N/A;  . HYSTEROSCOPY W/D&C N/A 04/10/2016   Procedure: HYSTEROSCOPY WITH fractional dilatation and curretage;  Surgeon: [name], MD;  Location: [facility];  Service: Gynecology;  Laterality: N/A;  . LEFT HEART CATHETERIZATION WITH CORONARY ANGIOGRAM  07/24/2011   Procedure: LEFT HEART CATHETERIZATION WITH CORONARY ANGIOGRAM;  Surgeon: Laverda Page, MD;  Location: [facility] C[facility] LAB;  Service: Cardiovascular;;  . ROBOTIC ASSISTED TOTAL HYSTERECTOMY WITH SALPINGECTOMY Bilateral 06/11/2016   Procedure: ROBOTIC ASSISTED TOTAL HYSTERECTOMY WITH SALPINGECTOMY;  Surgeon: [name], MD;  Location: [facility];  Service: Gynecology;  Laterality: Bilateral;  . THYROIDECTOMY N/A 12/18/2012   Procedure: TOTAL THYROIDECTOMY;  Surgeon: Earnstine Regal, MD;  Location: [facility];  Service: General;  Laterality: N/A;  . UPPER GI ENDOSCOPY      Current Outpatient Medications  Medication Sig Dispense Refill  . albuterol (PROVENTIL HFA;VENTOLIN HFA) 108 (90 Base) MCG/ACT inhaler Inhale 2 puffs into the lungs every 4 (four) hours as needed for wheezing or shortness of breath (cough, chest tightness). 18 g 1  . ALPRAZolam (XANAX) 0.5 MG tablet TAKE 1 TABLET(0.5 MG) BY MOUTH AT BEDTIME AS NEEDED FOR SLEEP 30 tablet  0  . atorvastatin (LIPITOR) 20 MG tablet TAKE 1 TABLET(20 MG) BY MOUTH DAILY 90 tablet 3  . Canagliflozin-metFORMIN HCl (INVOKAMET) 150-500 MG TABS Take 1 tablet by mouth daily. 30 tablet 0  . celecoxib (CELEBREX) 200 MG capsule TAKE 1 CAPSULE(200 MG) BY MOUTH TWICE DAILY 60 capsule 0  . chlorthalidone (HYGROTON) 25 MG tablet TAKE 1 TABLET(25 MG) BY MOUTH DAILY 90 tablet 3  . Cholecalciferol (VITAMIN D3) 50000 units CAPS Take 50,000 Units by mouth every Monday.   3  . cyclobenzaprine (FLEXERIL) 10 MG tablet Take 1 tablet (10 mg total) by mouth 3 (three) times daily as needed for muscle spasms. 30 tablet 0  . diazepam (VALIUM) 5 MG tablet TAKE 1 TABLET(5 MG) BY  MOUTH DAILY AS NEEDED FOR MUSCLE SPASMS 30 tablet 0  . diclofenac (VOLTAREN) 75 MG EC tablet Take 1 tablet twice daily as needed for pain.  2  . docusate sodium (COLACE) 250 MG capsule Take 1 capsule (250 mg total) by mouth daily. (Patient taking differently: Take by mouth. ) 30 capsule 0  . ferrous sulfate 325 (65 FE) MG tablet Take 325 mg by mouth 3 (three) times daily with meals.    Marland Kitchen glycerin adult 2 g suppository Place 1 suppository rectally as needed for constipation.    Marland Kitchen HYDROcodone-acetaminophen (NORCO) 5-325 MG tablet Take 1 tablet by mouth every 6 (six) hours as needed for moderate pain. 12 tablet 0  . levothyroxine (SYNTHROID, LEVOTHROID) 300 MCG tablet Take 1 tablet (300 mcg total) by mouth daily. 90 tablet 3  . lisinopril (PRINIVIL,ZESTRIL) 5 MG tablet Take 1 tablet (5 mg total) by mouth daily. 90 tablet 3  . nystatin (MYCOSTATIN) powder Apply topically 4 (four) times daily. (Patient taking differently: Apply topically 4 (four) times daily as needed (For heat rash.). ) 60 g 2  . ondansetron (ZOFRAN ODT) 4 MG disintegrating tablet Take 1 tablet (4 mg total) by mouth every 8 (eight) hours as needed for nausea or vomiting. (Patient not taking: Reported on 12/24/2017) 10 tablet 0  . polyethylene glycol (MIRALAX / GLYCOLAX) packet Take 17 g by mouth 2 (two) times daily. Take 2 packets today (day one), wait 2 hours, if no stool produced then take 2 more packets on day one; then continue taking 2 packets daily until you achieve daily soft stools; if water stool develops then cut back to 1 packet daily. (Patient taking differently: Take 17 g by mouth 3 (three) times daily. ) 14 each 0  . potassium chloride SA (K-DUR,KLOR-CON) 20 MEQ tablet Take 1 tablet (20 mEq total) by mouth 2 (two) times daily. 60 tablet 0  . ranitidine (ZANTAC) 150 MG tablet Take 1 tablet (150 mg total) by mouth 2 (two) times daily. 60 tablet 3  . senna (SENOKOT) 8.6 MG TABS tablet Take 1 tablet (8.6 mg total) by mouth daily as  needed for mild constipation. 120 each 0  . sertraline (ZOLOFT) 100 MG tablet Take 1 tablet (100 mg total) by mouth daily. 30 tablet 5  . sucralfate (CARAFATE) 1 G tablet Take 1 tablet (1 g total) by mouth 4 (four) times daily. 30 tablet 0   No current facility-administered medications for this visit.     Family History  Problem Relation Age of Onset  . Heart disease Mother   . Stroke Mother   . Hypertension Mother   . Heart disease Father   . Colon cancer Father   . Hypertension Father   . Diabetes Father   .  Cancer Paternal Grandmother   . Asthma Daughter   . Seizures Daughter   . Hypertension Sister   . Thyroid disease Sister   . Mental illness Daughter 63       suicide attempt 06/2013  . Asthma Sister   . Hypertension Sister   . Breast cancer Paternal Aunt   . Cancer Paternal Aunt     Review of Systems  Exam:   LMP 05/08/2016 (Exact Date)     General appearance: alert, cooperative and appears stated age Head: Normocephalic, without obvious abnormality, atraumatic Neck: no adenopathy, supple, symmetrical, trachea midline and thyroid normal to inspection and palpation Lungs: clear to auscultation bilaterally Breasts: normal appearance, no masses or tenderness, No nipple retraction or dimpling, No nipple discharge or bleeding, No axillary or supraclavicular adenopathy Heart: regular rate and rhythm Abdomen: soft, non-tender; no masses, no organomegaly Extremities: extremities normal, atraumatic, no cyanosis or edema Skin: Skin color, texture, turgor normal. No rashes or lesions Lymph nodes: Cervical, supraclavicular, and axillary nodes normal. No abnormal inguinal nodes palpated Neurologic: Grossly normal  Pelvic: External genitalia:  no lesions              Urethra:  normal appearing urethra with no masses, tenderness or lesions              Bartholins and Skenes: normal                 Vagina: normal appearing vagina with normal color and discharge, no lesions               Cervix: no lesions              Pap taken: {yes no:314532} Bimanual Exam:  Uterus:  normal size, contour, position, consistency, mobility, non-tender              Adnexa: no mass, fullness, tenderness              Rectal exam: {yes no:314532}.  Confirms.              Anus:  normal sphincter tone, no lesions  Chaperone was present for exam.  Assessment:   Well woman visit with normal exam.   Plan: Mammogram screening. Recommended self breast awareness. Pap and HR HPV as above. Guidelines for Calcium, Vitamin D, regular exercise program including cardiovascular and weight bearing exercise.   Follow up annually and prn.   Additional counseling given.  {yes [id number]. _______ minutes face to face time of which over 50% was spent in counseling.    After visit summary provided.

## 2018-02-26 NOTE — Telephone Encounter (Signed)
Invokana refilled per protocol. Patient is due for follow up visit next month.   Provider, please refill/deny Celebrex prescription.

## 2018-02-26 NOTE — Telephone Encounter (Signed)
Patient is requesting a refill of the following medications: Requested Prescriptions   Pending Prescriptions Disp Refills  . ALPRAZolam (XANAX) 0.5 MG tablet [Pharmacy Med Name: ALPRAZOLAM 0.5MG  TABLETS] 30 tablet 0    Sig: TAKE 1 TABLET(0.5 MG) BY MOUTH AT BEDTIME AS NEEDED FOR SLEEP  . diazepam (VALIUM) 5 MG tablet [Pharmacy Med Name: DIAZEPAM 5MG  TABLETS] 30 tablet 0    Sig: TAKE 1 TABLET(5 MG) BY MOUTH DAILY AS NEEDED FOR MUSCLE SPASMS    Date of patient request: 02/19/18 Last office visit: 12/24/17 Date of last refill: 12/24/17 Last refill amount: #30, no refills Follow up time period per chart: Return in about 3 months (around 03/25/2018) for re-evaluation of blood pressure.

## 2018-02-26 NOTE — Telephone Encounter (Signed)
Meds ordered this encounter  Medications  . celecoxib (CELEBREX) 200 MG capsule    Sig: Take 1 capsule (200 mg total) by mouth daily.    Dispense:  60 capsule    Refill:  2  . Canagliflozin-metFORMIN HCl (INVOKAMET) 150-500 MG TABS    Sig: Take 1 tablet by mouth daily.    Dispense:  30 tablet    Refill:  0

## 2018-02-27 ENCOUNTER — Ambulatory Visit: Payer: BLUE CROSS/BLUE SHIELD | Admitting: Obstetrics and Gynecology

## 2018-03-06 DIAGNOSIS — M1712 Unilateral primary osteoarthritis, left knee: Secondary | ICD-10-CM | POA: Diagnosis not present

## 2018-03-06 DIAGNOSIS — M1711 Unilateral primary osteoarthritis, right knee: Secondary | ICD-10-CM | POA: Diagnosis not present

## 2018-03-11 DIAGNOSIS — M5136 Other intervertebral disc degeneration, lumbar region: Secondary | ICD-10-CM | POA: Diagnosis not present

## 2018-03-11 DIAGNOSIS — M5030 Other cervical disc degeneration, unspecified cervical region: Secondary | ICD-10-CM | POA: Diagnosis not present

## 2018-03-12 DIAGNOSIS — M1712 Unilateral primary osteoarthritis, left knee: Secondary | ICD-10-CM | POA: Diagnosis not present

## 2018-03-12 DIAGNOSIS — M25561 Pain in right knee: Secondary | ICD-10-CM | POA: Diagnosis not present

## 2018-03-12 DIAGNOSIS — M25562 Pain in left knee: Secondary | ICD-10-CM | POA: Diagnosis not present

## 2018-03-12 DIAGNOSIS — M1711 Unilateral primary osteoarthritis, right knee: Secondary | ICD-10-CM | POA: Diagnosis not present

## 2018-03-16 ENCOUNTER — Other Ambulatory Visit: Payer: Self-pay | Admitting: Physician Assistant

## 2018-03-16 DIAGNOSIS — E785 Hyperlipidemia, unspecified: Secondary | ICD-10-CM

## 2018-03-24 ENCOUNTER — Other Ambulatory Visit: Payer: Self-pay

## 2018-03-24 ENCOUNTER — Emergency Department (HOSPITAL_COMMUNITY): Payer: BLUE CROSS/BLUE SHIELD

## 2018-03-24 ENCOUNTER — Encounter (HOSPITAL_COMMUNITY): Payer: Self-pay

## 2018-03-24 ENCOUNTER — Emergency Department (HOSPITAL_COMMUNITY)
Admission: EM | Admit: 2018-03-24 | Discharge: 2018-03-25 | Disposition: A | Discharge status: Home / Self Care | Payer: BLUE CROSS/BLUE SHIELD | Attending: Emergency Medicine | Admitting: Emergency Medicine

## 2018-03-24 DIAGNOSIS — Z7984 Long term (current) use of oral hypoglycemic drugs: Secondary | ICD-10-CM | POA: Diagnosis not present

## 2018-03-24 DIAGNOSIS — E119 Type 2 diabetes mellitus without complications: Secondary | ICD-10-CM | POA: Diagnosis not present

## 2018-03-24 DIAGNOSIS — R0600 Dyspnea, unspecified: Secondary | ICD-10-CM | POA: Diagnosis not present

## 2018-03-24 DIAGNOSIS — M1711 Unilateral primary osteoarthritis, right knee: Secondary | ICD-10-CM | POA: Diagnosis not present

## 2018-03-24 DIAGNOSIS — Z91040 Latex allergy status: Secondary | ICD-10-CM | POA: Diagnosis not present

## 2018-03-24 DIAGNOSIS — M1712 Unilateral primary osteoarthritis, left knee: Secondary | ICD-10-CM | POA: Diagnosis not present

## 2018-03-24 DIAGNOSIS — J4521 Mild intermittent asthma with (acute) exacerbation: Secondary | ICD-10-CM | POA: Diagnosis not present

## 2018-03-24 DIAGNOSIS — Z79899 Other long term (current) drug therapy: Secondary | ICD-10-CM | POA: Insufficient documentation

## 2018-03-24 DIAGNOSIS — R0602 Shortness of breath: Secondary | ICD-10-CM | POA: Diagnosis not present

## 2018-03-24 DIAGNOSIS — I10 Essential (primary) hypertension: Secondary | ICD-10-CM | POA: Insufficient documentation

## 2018-03-24 LAB — CBC
HCT: 40.7 % (ref 36.0–46.0)
Hemoglobin: 12.6 g/dL (ref 12.0–15.0)
MCH: 28.9 pg (ref 26.0–34.0)
MCHC: 31 g/dL (ref 30.0–36.0)
MCV: 93.3 fL (ref 78.0–100.0)
PLATELETS: 484 10*3/uL — AB (ref 150–400)
RBC: 4.36 MIL/uL (ref 3.87–5.11)
RDW: 15 % (ref 11.5–15.5)
WBC: 12.1 10*3/uL — AB (ref 4.0–10.5)

## 2018-03-24 LAB — I-STAT TROPONIN, ED: TROPONIN I, POC: 0 ng/mL (ref 0.00–0.08)

## 2018-03-24 LAB — BASIC METABOLIC PANEL
Anion gap: 11 (ref 5–15)
BUN: 14 mg/dL (ref 6–20)
CALCIUM: 8.7 mg/dL — AB (ref 8.9–10.3)
CO2: 26 mmol/L (ref 22–32)
CREATININE: 1.08 mg/dL — AB (ref 0.44–1.00)
Chloride: 101 mmol/L (ref 98–111)
GFR calc Af Amer: >60 mL/min (ref >60)
GFR, EST NON AFRICAN AMERICAN: 58 mL/min — AB (ref >60)
Glucose, Bld: 200 mg/dL — ABNORMAL HIGH (ref 70–99)
POTASSIUM: 3.2 mmol/L — AB (ref 3.5–5.1)
SODIUM: 138 mmol/L (ref 135–145)

## 2018-03-24 LAB — I-STAT BETA HCG BLOOD, ED (MC, WL, AP ONLY)

## 2018-03-24 MED ORDER — ALBUTEROL SULFATE (2.5 MG/3ML) 0.083% IN NEBU
5.0000 mg | INHALATION_SOLUTION | Freq: Once | RESPIRATORY_TRACT | Status: AC
  Administered 2018-03-24: 5 mg via RESPIRATORY_TRACT
  Filled 2018-03-24: qty 6

## 2018-03-24 NOTE — ED Notes (Signed)
Pt now responding slowly verbally.

## 2018-03-24 NOTE — ED Triage Notes (Signed)
Pt endorses shob since earlier today. Hx of asthma. Pt is staring straight ahead and not speaking with anyone. Slight expiratory wheezing noted. VSS. 100% on RA.

## 2018-03-25 ENCOUNTER — Other Ambulatory Visit: Payer: Self-pay

## 2018-03-25 LAB — CBG MONITORING, ED: GLUCOSE-CAPILLARY: 104 mg/dL — AB (ref 70–99)

## 2018-03-25 MED ORDER — IBUPROFEN 800 MG PO TABS
800.0000 mg | ORAL_TABLET | Freq: Once | ORAL | Status: AC
  Administered 2018-03-25: 800 mg via ORAL
  Filled 2018-03-25: qty 1

## 2018-03-25 MED ORDER — ACETAMINOPHEN 500 MG PO TABS
1000.0000 mg | ORAL_TABLET | Freq: Once | ORAL | Status: AC
  Administered 2018-03-25: 1000 mg via ORAL
  Filled 2018-03-25: qty 2

## 2018-03-25 NOTE — ED Provider Notes (Signed)
[facility]PART[facility] Provider Note   CSN: [id number] Arrival date & time: 03/24/18  2207     History   Chief Complaint Chief Complaint  Patient presents with  . Shortness of Breath    HPI Emily Mathis is a 52 y.o. female.  Patient presents to the ER for evaluation of difficulty breathing.  Patient reports that she has been feeling weak all day.  She had some spasms of the muscles on the right side of her neck earlier, these have resolved.  Tonight she started having shortness of breath, feeling like she was wheezing.  She does have a history of asthma.  She did not use her inhaler at home.  She was given albuterol in triage with improvement of her breathing.     Past Medical History:  Diagnosis Date  . Abnormal Pap smear of cervix 1990   Hx cryotherapy to cervix  . Abnormal uterine bleeding   . Anemia 02/20/16   Transfusion of 1 unit pRBCs  . Anxiety   . Anxiety and depression   . Arthritis   . Asthma   . 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 Chest pain    per pt due to anemia-   . Diabetes mellitus without complication ([place])   . Fibroid   . Generalized pain   . GERD (gastroesophageal reflux disease)   . Headache(784.0)   . History of blood transfusion 02/20/2016  . Hyperlipidemia   . Hypertension    per Dr. Irven Shelling note 08/30/2011  . Hypothyroidism   . Iron deficiency anemia 02/15/2016  . Leg swelling    bilateral  . Mild scoliosis 08/06/2017  . Neuromuscular disorder ([place])   . Shortness of breath dyspnea    on exertion due to anemia  . Sleep apnea   . Thyroid disease    Hx 3 goiters--hx thyroidectomy--sees Dr. Forde Dandy    Patient Active Problem List   Diagnosis Date Noted  . Acute gouty arthritis 01/01/2018  . Pain of toe of right foot 01/01/2018  . Essential hypertension 01/01/2018  . OSA (obstructive sleep apnea) 11/12/2017  . DJD (degenerative joint disease) of knee 09/13/2017  . DDD (degenerative disc disease), lumbar  09/13/2017  . DDD (degenerative disc disease), cervical 09/13/2017  . Abdominal pain   . Chronic pancreatitis ([place])   . Constipation   . Status post laparoscopic hysterectomy 06/11/2016  . Myalgia 02/15/2016  . Upper airway cough syndrome 10/21/2015  . Fibromyalgia 06/16/2015  . Type 2 diabetes mellitus without complication, without long-term current use of insulin ([place]) 09/14/2013  . Keloid scar, cervical incision 09/02/2013  . Reaction, situational, acute, to stress 07/02/2013  . 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 BMI 45.0-49.9, adult ([place]) 03/27/2013  . GERD (gastroesophageal reflux disease) 02/15/2013  . Hypocalcemia 01/07/2013  . Chest pain on exertion 07/24/2011    Past Surgical History:  Procedure Laterality Date  . CARDIAC CATHETERIZATION  2013  . CESAREAN SECTION  1995  . CESAREAN SECTION  1996  . COLONOSCOPY    . CYSTOSCOPY N/A 06/11/2016   Procedure: CYSTOSCOPY;  Surgeon: [name], MD;  Location: [facility];  Service: Gynecology;  Laterality: N/A;  . HYSTEROSCOPY W/D&C N/A 04/10/2016   Procedure: HYSTEROSCOPY WITH fractional dilatation and curretage;  Surgeon: [name], MD;  Location: [facility];  Service: Gynecology;  Laterality: N/A;  . LEFT HEART CATHETERIZATION WITH CORONARY ANGIOGRAM  07/24/2011   Procedure: LEFT HEART CATHETERIZATION WITH CORONARY ANGIOGRAM;  Surgeon: Laverda Page, MD;  Location: [facility] CATH LAB;  Service: Cardiovascular;;  . ROBOTIC ASSISTED TOTAL HYSTERECTOMY WITH SALPINGECTOMY Bilateral 06/11/2016   Procedure: ROBOTIC ASSISTED TOTAL HYSTERECTOMY WITH SALPINGECTOMY;  Surgeon: [name], MD;  Location: [facility];  Service: Gynecology;  Laterality: Bilateral;  . THYROIDECTOMY N/A 12/18/2012   Procedure: TOTAL THYROIDECTOMY;  Surgeon: Earnstine Regal, MD;  Location: [facility];  Service: General;  Laterality: N/A;  . UPPER GI ENDOSCOPY       OB History    Gravida  3   Para  3   Term  3   Preterm      AB        Living  3     SAB      TAB      Ectopic      Multiple      Live Births               Home Medications    Prior to Admission medications   Medication Sig Start Date End Date Taking? Authorizing Provider  albuterol (PROVENTIL HFA;VENTOLIN HFA) 108 (90 Base) MCG/ACT inhaler Inhale 2 puffs into the lungs every 4 (four) hours as needed for wheezing or shortness of breath (cough, chest tightness). 09/06/17   Harrison Mons, PA-C  ALPRAZolam (XANAX) 0.5 MG tablet TAKE 1 TABLET(0.5 MG) BY MOUTH AT BEDTIME AS NEEDED FOR SLEEP 02/26/18   Jeffery, [name], PA-C  atorvastatin (LIPITOR) 20 MG tablet TAKE 1 TABLET(20 MG) BY MOUTH DAILY 12/24/17   Jeffery, [name], PA-C  Canagliflozin-metFORMIN HCl (INVOKAMET) 150-500 MG TABS Take 1 tablet by mouth daily. 02/26/18   Harrison Mons, PA-C  celecoxib (CELEBREX) 200 MG capsule Take 1 capsule (200 mg total) by mouth daily. 02/26/18   Harrison Mons, PA-C  chlorthalidone (HYGROTON) 25 MG tablet TAKE 1 TABLET(25 MG) BY MOUTH DAILY 12/24/17   Harrison Mons, PA-C  Cholecalciferol (VITAMIN D3) 50000 units CAPS Take 50,000 Units by mouth every Monday.  06/14/16   [provider]  cyclobenzaprine (FLEXERIL) 10 MG tablet Take 1 tablet (10 mg total) by mouth 3 (three) times daily as needed for muscle spasms. 11/12/16   Harrison Mons, PA-C  diazepam (VALIUM) 5 MG tablet TAKE 1 TABLET(5 MG) BY MOUTH DAILY AS NEEDED FOR MUSCLE SPASMS 02/26/18   Harrison Mons, PA-C  diclofenac (VOLTAREN) 75 MG EC tablet Take 1 tablet twice daily as needed for pain. 08/15/17   [provider]  docusate sodium (COLACE) 250 MG capsule Take 1 capsule (250 mg total) by mouth daily. Patient taking differently: Take by mouth.  09/06/16   Harrison Mons, PA-C  ferrous sulfate 325 (65 FE) MG tablet Take 325 mg by mouth 3 (three) times daily with meals.    [provider]  glycerin adult 2 g suppository Place 1 suppository rectally as needed for  constipation.    [provider]  HYDROcodone-acetaminophen (NORCO) 5-325 MG tablet Take 1 tablet by mouth every 6 (six) hours as needed for moderate pain. 01/01/18   Horald Pollen, MD  levothyroxine (SYNTHROID, LEVOTHROID) 300 MCG tablet Take 1 tablet (300 mcg total) by mouth daily. 06/17/15   Jeffery, [name], PA-C  lisinopril (PRINIVIL,ZESTRIL) 5 MG tablet Take 1 tablet (5 mg total) by mouth daily. 12/24/17   Harrison Mons, PA-C  nystatin (MYCOSTATIN) powder Apply topically 4 (four) times daily. Patient taking differently: Apply topically 4 (four) times daily as needed (For heat rash.).  10/02/15   Harrison Mons, PA-C  ondansetron (ZOFRAN ODT) 4 MG disintegrating tablet Take 1 tablet (4 mg total) by mouth every 8 (eight) hours as needed for nausea or vomiting. Patient not taking: Reported on 12/24/2017 11/28/17   Carlisle Cater, PA-C  polyethylene glycol (MIRALAX / GLYCOLAX) packet Take 17 g by mouth 2 (two) times daily. Take 2 packets today (day one), wait 2 hours, if no stool produced then take 2 more packets on day one; then continue taking 2 packets daily until you achieve daily soft stools; if water stool develops then cut back to 1 packet daily. Patient taking differently: Take 17 g by mouth 3 (three) times daily.  07/24/16   Street, [place], PA-C  potassium chloride SA (K-DUR,KLOR-CON) 20 MEQ tablet Take 1 tablet (20 mEq total) by mouth 2 (two) times daily. 06/12/16   [name], MD  ranitidine (ZANTAC) 150 MG tablet Take 1 tablet (150 mg total) by mouth 2 (two) times daily. 02/05/17   Harrison Mons, PA-C  senna (SENOKOT) 8.6 MG TABS tablet Take 1 tablet (8.6 mg total) by mouth daily as needed for mild constipation. 08/22/16   Mikell, Jeani Sow, MD  sertraline (ZOLOFT) 100 MG tablet Take 1 tablet (100 mg total) by mouth daily. 12/24/17   Harrison Mons, PA-C  sucralfate (CARAFATE) 1 G tablet Take 1 tablet (1 g total) by mouth 4 (four) times daily. 04/19/15   Linton Flemings, MD    Family History Family History  Problem Relation Age of Onset  . Heart disease Mother   . Stroke Mother   . Hypertension Mother   . Heart disease Father   . Colon cancer Father   . Hypertension Father   . Diabetes Father   . Cancer Paternal Grandmother   . Asthma Daughter   . Seizures Daughter   . Hypertension Sister   . Thyroid disease Sister   . Mental illness Daughter 76       suicide attempt 06/2013  . Asthma Sister   . Hypertension Sister   . Breast cancer Paternal Aunt   . Cancer Paternal Aunt     Social History Social History   Tobacco Use  . Smoking status: Never Smoker  . Smokeless tobacco: Never Used  Substance Use Topics  . Alcohol use: Yes    Alcohol/week: 0.0 oz    Frequency: Never    Comment: Occasional glass of wine  . Drug use: No     Allergies   Dilaudid [hydromorphone hcl]; Hydrocodone; and Latex   Review of Systems Review of Systems  Respiratory: Positive for shortness of breath and wheezing.   Cardiovascular: Negative for chest pain.  Musculoskeletal: Positive for neck pain.  All other systems reviewed and are negative.    Physical Exam Updated Vital Signs BP 107/72 (BP Location: Right Arm)   Pulse 70   Temp 98.4 F (36.9 C) (Oral)   Resp 18   LMP 05/08/2016 (Exact Date)   SpO2 98%   Physical Exam  Constitutional: She is oriented to person, place, and time. She appears well-developed and well-nourished. No distress.  HENT:  Head: Normocephalic and atraumatic.  Right Ear: Hearing normal.  Left Ear: Hearing normal.  Nose: Nose normal.  Mouth/Throat: Oropharynx is clear and moist and mucous membranes are normal.  Eyes: Pupils are equal, round, and reactive to light. Conjunctivae and EOM are normal.  Neck: Normal range of motion. Neck supple.  Cardiovascular: Regular rhythm, S1 normal and S2 normal. Exam reveals no gallop and no friction rub.  No murmur heard. Pulmonary/Chest: Effort normal and breath sounds  normal. No respiratory distress. She exhibits no tenderness.  Abdominal: Soft. Normal appearance and bowel sounds are normal. There is no hepatosplenomegaly. There is no tenderness. There is no rebound, no guarding, no tenderness at McBurney's point and negative Murphy's sign. No hernia.  Musculoskeletal: Normal range of motion.  Neurological: She is alert and oriented to person, place, and time. She has normal strength. No cranial nerve deficit or sensory deficit. Coordination normal. GCS eye subscore is 4. GCS verbal subscore is 5. GCS motor subscore is 6.  Skin: Skin is warm, dry and intact. No rash noted. No cyanosis.  Psychiatric: She has a normal mood and affect. Her speech is normal and behavior is normal. Thought content normal.  Nursing note and vitals reviewed.    ED Treatments / Results  Labs (all labs ordered are listed, but only abnormal results are displayed) Labs Reviewed  BASIC METABOLIC PANEL - Abnormal; Notable for the following components:      Result Value   Potassium 3.2 (*)    Glucose, Bld 200 (*)    Creatinine, Ser 1.08 (*)    Calcium 8.7 (*)    GFR calc non Af Amer 58 (*)    All other components within normal limits  CBC - Abnormal; Notable for the following components:   WBC 12.1 (*)    Platelets 484 (*)    All other components within normal limits  I-STAT TROPONIN, ED  I-STAT BETA HCG BLOOD, ED ([facility], [facility], [facility] ONLY)  CBG MONITORING, ED    EKG EKG Interpretation  Date/Time:  Monday March 24 2018 22:15:50 EDT Ventricular Rate:  86 PR Interval:  160 QRS Duration: 78 QT Interval:  364 QTC Calculation: 435 R Axis:   74 Text Interpretation:  Normal sinus rhythm ST & T wave abnormality, consider inferior ischemia Abnormal ECG No STEMI. Similar to prior.  Confirmed by Nanda Quinton [phone]) on 03/24/2018 10:17:47 PM   Radiology Dg Chest 2 View  Result Date: 03/24/2018 CLINICAL DATA:  Dyspnea, weakness and dizziness EXAM: CHEST - 2 VIEW COMPARISON:  None.  FINDINGS: AP upright and lateral views of the chest. Mild cardiac enlargement accentuated by the AP projection. Both lungs are clear. The visualized skeletal structures are unremarkable. IMPRESSION: No active cardiopulmonary disease. Electronically Signed   By: Ashley Royalty M.D.   On: 03/24/2018 22:55    Procedures Procedures (including critical care time)  Medications Ordered in ED Medications  ibuprofen (ADVIL,MOTRIN) tablet 800 mg (has no administration in time range)  acetaminophen (TYLENOL) tablet 1,000 mg (has no administration in time range)  albuterol (PROVENTIL) (2.5 MG/3ML) 0.083% nebulizer solution 5 mg (5 mg Nebulization Given 03/24/18 2225)     Initial Impression / Assessment and Plan / ED Course  I have reviewed the triage vital signs and the nursing notes.  Pertinent labs & imaging results that were available during my care of the patient were reviewed by me and considered in my medical decision making (see chart for details).     Patient presents to the ER primarily for evaluation of asthma.  She started having shortness of breath and wheezing tonight.  She was administered a nebulizer treatment in triage and improved.  Currently her oxygenation is 98% on room air, no wheezing with good air movement.  She is not experiencing any chest pain.  Work-up is normal other than very slightly elevated blood sugar at 200.  I feel patient would benefit from steroids,  but do not want to give her an extended oral prednisone course.  We will therefore give her a shot of Decadron tonight and have her monitor her sugars closely.  Final Clinical Impressions(s) / ED Diagnoses   Final diagnoses:  Mild intermittent asthma with exacerbation    ED Discharge Orders    None       Deloise Marchant, Gwenyth Allegra, MD 03/25/18 [phone]

## 2018-03-28 ENCOUNTER — Ambulatory Visit (INDEPENDENT_AMBULATORY_CARE_PROVIDER_SITE_OTHER): Payer: BLUE CROSS/BLUE SHIELD | Admitting: Obstetrics and Gynecology

## 2018-03-28 ENCOUNTER — Other Ambulatory Visit: Payer: Self-pay

## 2018-03-28 ENCOUNTER — Encounter: Payer: Self-pay | Admitting: Obstetrics and Gynecology

## 2018-03-28 VITALS — BP 130/78 | HR 84 | Resp 16 | Ht 63.0 in | Wt 260.0 lb

## 2018-03-28 DIAGNOSIS — Z01419 Encounter for gynecological examination (general) (routine) without abnormal findings: Secondary | ICD-10-CM

## 2018-03-28 NOTE — Patient Instructions (Signed)
EXERCISE AND DIET:  We recommended that you start or continue a regular exercise program for good health. Regular exercise means any activity that makes your heart beat faster and makes you sweat.  We recommend exercising at least 30 minutes per day at least 3 days a week, preferably 4 or 5.  We also recommend a diet low in fat and sugar.  Inactivity, poor dietary choices and obesity can cause diabetes, heart attack, stroke, and kidney damage, among others.    ALCOHOL AND SMOKING:  Women should limit their alcohol intake to no more than 7 drinks/beers/glasses of wine (combined, not each!) per week. Moderation of alcohol intake to this level decreases your risk of breast cancer and liver damage. And of course, no recreational drugs are part of a healthy lifestyle.  And absolutely no smoking or even second hand smoke. Most people know smoking can cause heart and lung diseases, but did you know it also contributes to weakening of your bones? Aging of your skin?  Yellowing of your teeth and nails?  CALCIUM AND VITAMIN D:  Adequate intake of calcium and Vitamin D are recommended.  The recommendations for exact amounts of these supplements seem to change often, but generally speaking 600 mg of calcium (either carbonate or citrate) and 800 units of Vitamin D per day seems prudent. Certain women may benefit from higher intake of Vitamin D.  If you are among these women, your doctor will have told you during your visit.    PAP SMEARS:  Pap smears, to check for cervical cancer or precancers,  have traditionally been done yearly, although recent scientific advances have shown that most women can have pap smears less often.  However, every woman still should have a physical exam from her gynecologist every year. It will include a breast check, inspection of the vulva and vagina to check for abnormal growths or skin changes, a visual exam of the cervix, and then an exam to evaluate the size and shape of the uterus and  ovaries.  And after 52 years of age, a rectal exam is indicated to check for rectal cancers. We will also provide age appropriate advice regarding health maintenance, like when you should have certain vaccines, screening for sexually transmitted diseases, bone density testing, colonoscopy, mammograms, etc.   MAMMOGRAMS:  All women over 40 years old should have a yearly mammogram. Many facilities now offer a "3D" mammogram, which may cost around $50 extra out of pocket. If possible,  we recommend you accept the option to have the 3D mammogram performed.  It both reduces the number of women who will be called back for extra views which then turn out to be normal, and it is better than the routine mammogram at detecting truly abnormal areas.    COLONOSCOPY:  Colonoscopy to screen for colon cancer is recommended for all women at age 50.  We know, you hate the idea of the prep.  We agree, BUT, having colon cancer and not knowing it is worse!!  Colon cancer so often starts as a polyp that can be seen and removed at colonscopy, which can quite literally save your life!  And if your first colonoscopy is normal and you have no family history of colon cancer, most women don't have to have it again for 10 years.  Once every ten years, you can do something that may end up saving your life, right?  We will be happy to help you get it scheduled when you are ready.    Be sure to check your insurance coverage so you understand how much it will cost.  It may be covered as a preventative service at no cost, but you should check your particular policy.     Menopause and Herbal Products What is menopause? Menopause is the normal time of life when menstrual periods decrease in frequency and eventually stop completely. This process can take several years for some women. Menopause is complete when you have had an absence of menstruation for a full year since your last menstrual period. It usually occurs between the ages of 48 and  55. It is not common for menopause to begin before the age of 40. During menopause, your body stops producing the female hormones estrogen and progesterone. Common symptoms associated with this loss of hormones (vasomotor symptoms) are:  Hot flashes.  Hot flushes.  Night sweats.  Other common symptoms and complications of menopause include:  Decrease in sex drive.  Vaginal dryness and thinning of the walls of the vagina. This can make sex painful.  Dryness of the skin and development of wrinkles.  Headaches.  Tiredness.  Irritability.  Memory problems.  Weight gain.  Bladder infections.  Hair growth on the face and chest.  Inability to reproduce offspring (infertility).  Loss of density in the bones (osteoporosis) increasing your risk for breaks (fractures).  Depression.  Hardening and narrowing of the arteries (atherosclerosis). This increases your risk of heart attack and stroke.  What treatment options are available? There are many treatment choices for menopause symptoms. The most common treatment is hormone replacement therapy. Many alternative therapies for menopause are emerging, including the use of herbal products. These supplements can be found in the form of herbs, teas, oils, tinctures, and pills. Common herbal supplements for menopause are made from plants that contain phytoestrogens. Phytoestrogens are compounds that occur naturally in plants and plant products. They act like estrogen in the body. Foods and herbs that contain phytoestrogens include:  Soy.  Flax seeds.  Red clover.  Ginseng.  What menopause symptoms may be helped if I use herbal products?  Vasomotor symptoms. These may be helped by: ? Soy. Some studies show that soy may have a moderate benefit for hot flashes. ? Black cohosh. There is limited evidence indicating this may be beneficial for hot flashes.  Symptoms that are related to heart and blood vessel disease. These may be  helped by soy. Studies have shown that soy can help to lower cholesterol.  Depression. This may be helped by: ? St. John's wort. There is limited evidence that shows this may help mild to moderate depression. ? Black cohosh. There is evidence that this may help depression and mood swings.  Osteoporosis. Soy may help to decrease bone loss that is associated with menopause and may prevent osteoporosis. Limited evidence indicates that red clover may offer some bone loss protection as well. Other herbal products that are commonly used during menopause lack enough evidence to support their use as a replacement for conventional menopause therapies. These products include evening primrose, ginseng, and red clover. What are the cases when herbal products should not be used during menopause? Do not use herbal products during menopause without your health care provider's approval if:  You are taking medicine.  You have a preexisting liver condition.  Are there any risks in my taking herbal products during menopause? If you choose to use herbal products to help with symptoms of menopause, keep in mind that:  Different supplements have different and unmeasured   amounts of herbal ingredients.  Herbal products are not regulated the same way that medicines are.  Concentrations of herbs may vary depending on the way they are prepared. For example, the concentration may be different in a pill, tea, oil, and tincture.  Little is known about the risks of using herbal products, particularly the risks of long-term use.  Some herbal supplements can be harmful when combined with certain medicines.  Most commonly reported side effects of herbal products are mild. However, if used improperly, many herbal supplements can cause serious problems. Talk to your health care provider before starting any herbal product. If problems develop, stop taking the supplement and let your health care provider know. This  information is not intended to replace advice given to you by your health care provider. Make sure you discuss any questions you have with your health care provider. Document Released: 02/20/2008 Document Revised: 07/31/2016 Document Reviewed: 02/16/2014 Elsevier Interactive Patient Education  2017 [employer]  

## 2018-03-28 NOTE — Progress Notes (Signed)
52 y.o. [id number] Single African American female here for annual exam.    Decreased interested in sex.  Same partner for 13 years.  They live separately.  No pain.  Can enjoy when she does have intercourse. Caring a lot for the grandchildren.   Some hot flashes.  Managing ok.   Some urgency to void.  Can have leakage if she waits too long or sneezes.  Does Kegels but not regularly.   Has DJD and arthritis.  Also has fibromyalgia.   Saw PCP yesterday.   PCP:  Harrison Mons, PA-C  Patient's last menstrual period was 05/08/2016 (exact date).           Sexually active: No.  The current method of family planning is status post hysterectomy.    Exercising: No.  The patient does not participate in regular exercise at present. Smoker:  no  Health Maintenance: Pap:  03/12/16 Neg, Neg HR HPV History of abnormal Pap:  Yes -- years ago MMG:  04/15/17 BIRADS 1 negative/density b. She will schedule.  Colonoscopy:  01/04/16 Normal BMD:   n/a  Result  n/a TDaP:  09/14/13 Gardasil:   n/a HIV: negative in the past Hep C: negative in the past Screening Labs: PCP   reports that she has never smoked. She has never used smokeless tobacco. She reports that she drinks alcohol. She reports that she does not use drugs.  Past Medical History:  Diagnosis Date  . Abnormal Pap smear of cervix 1990   Hx cryotherapy to cervix  . Abnormal uterine bleeding   . Anemia 02/20/16   Transfusion of 1 unit pRBCs  . Anxiety   . Anxiety and depression   . Arthritis   . Asthma   . Cancer ([place])    Pre-Cancer-Ovarian  . Chest pain    per pt due to anemia-   . Diabetes mellitus without complication ([place])   . Fibroid   . Generalized pain   . GERD (gastroesophageal reflux disease)   . Headache(784.0)   . History of blood transfusion 02/20/2016  . Hyperlipidemia   . Hypertension    per Dr. Irven Shelling note 08/30/2011  . Hypothyroidism   . Iron deficiency anemia 02/15/2016  . Leg swelling    bilateral  .  Mild scoliosis 08/06/2017  . Neuromuscular disorder ([place])   . Shortness of breath dyspnea    on exertion due to anemia  . Sleep apnea   . Thyroid disease    Hx 3 goiters--hx thyroidectomy--sees Dr. Forde Dandy    Past Surgical History:  Procedure Laterality Date  . CARDIAC CATHETERIZATION  2013  . CESAREAN SECTION  1995  . CESAREAN SECTION  1996  . COLONOSCOPY    . CYSTOSCOPY N/A 06/11/2016   Procedure: CYSTOSCOPY;  Surgeon: [name], MD;  Location: [facility];  Service: Gynecology;  Laterality: N/A;  . HYSTEROSCOPY W/D&C N/A 04/10/2016   Procedure: HYSTEROSCOPY WITH fractional dilatation and curretage;  Surgeon: [name], MD;  Location: [facility];  Service: Gynecology;  Laterality: N/A;  . LEFT HEART CATHETERIZATION WITH CORONARY ANGIOGRAM  07/24/2011   Procedure: LEFT HEART CATHETERIZATION WITH CORONARY ANGIOGRAM;  Surgeon: Laverda Page, MD;  Location: [facility];  Service: Cardiovascular;;  . ROBOTIC ASSISTED TOTAL HYSTERECTOMY WITH SALPINGECTOMY Bilateral 06/11/2016   Procedure: ROBOTIC ASSISTED TOTAL HYSTERECTOMY WITH SALPINGECTOMY;  Surgeon: [name], MD;  Location: [facility];  Service: Gynecology;  Laterality: Bilateral;  . THYROIDECTOMY N/A 12/18/2012  Procedure: TOTAL THYROIDECTOMY;  Surgeon: Earnstine Regal, MD;  Location: [facility];  Service: General;  Laterality: N/A;  . UPPER GI ENDOSCOPY      Current Outpatient Medications  Medication Sig Dispense Refill  . albuterol (PROVENTIL HFA;VENTOLIN HFA) 108 (90 Base) MCG/ACT inhaler Inhale 2 puffs into the lungs every 4 (four) hours as needed for wheezing or shortness of breath (cough, chest tightness). 18 g 1  . ALPRAZolam (XANAX) 0.5 MG tablet TAKE 1 TABLET(0.5 MG) BY MOUTH AT BEDTIME AS NEEDED FOR SLEEP 30 tablet 0  . atorvastatin (LIPITOR) 20 MG tablet TAKE 1 TABLET(20 MG) BY MOUTH DAILY 90 tablet 3  . Canagliflozin-metFORMIN HCl (INVOKAMET) 150-500 MG TABS Take 1 tablet by mouth daily. 30 tablet  0  . celecoxib (CELEBREX) 200 MG capsule Take 1 capsule (200 mg total) by mouth daily. 60 capsule 2  . chlorthalidone (HYGROTON) 25 MG tablet TAKE 1 TABLET(25 MG) BY MOUTH DAILY 90 tablet 3  . Cholecalciferol (VITAMIN D3) 50000 units CAPS Take 50,000 Units by mouth every Monday.   3  . cyclobenzaprine (FLEXERIL) 10 MG tablet Take 1 tablet (10 mg total) by mouth 3 (three) times daily as needed for muscle spasms. 30 tablet 0  . diazepam (VALIUM) 5 MG tablet TAKE 1 TABLET(5 MG) BY MOUTH DAILY AS NEEDED FOR MUSCLE SPASMS 30 tablet 0  . diclofenac (VOLTAREN) 75 MG EC tablet Take 1 tablet twice daily as needed for pain.  2  . docusate sodium (COLACE) 250 MG capsule Take 1 capsule (250 mg total) by mouth daily. (Patient taking differently: Take 250 mg by mouth daily as needed for constipation. ) 30 capsule 0  . ferrous sulfate 325 (65 FE) MG tablet Take 325 mg by mouth 3 (three) times daily with meals.    Marland Kitchen glycerin adult 2 g suppository Place 1 suppository rectally as needed for constipation.    Marland Kitchen HYDROcodone-acetaminophen (NORCO) 5-325 MG tablet Take 1 tablet by mouth every 6 (six) hours as needed for moderate pain. 12 tablet 0  . levothyroxine (SYNTHROID, LEVOTHROID) 300 MCG tablet Take 1 tablet (300 mcg total) by mouth daily. 90 tablet 3  . lisinopril (PRINIVIL,ZESTRIL) 5 MG tablet Take 1 tablet (5 mg total) by mouth daily. 90 tablet 3  . nystatin (MYCOSTATIN) powder Apply topically 4 (four) times daily. (Patient taking differently: Apply topically 4 (four) times daily as needed (For heat rash.). ) 60 g 2  . polyethylene glycol (MIRALAX / GLYCOLAX) packet Take 17 g by mouth 2 (two) times daily. Take 2 packets today (day one), wait 2 hours, if no stool produced then take 2 more packets on day one; then continue taking 2 packets daily until you achieve daily soft stools; if water stool develops then cut back to 1 packet daily. (Patient taking differently: Take 17 g by mouth daily as needed for mild  constipation. ) 14 each 0  . potassium chloride SA (K-DUR,KLOR-CON) 20 MEQ tablet Take 1 tablet (20 mEq total) by mouth 2 (two) times daily. 60 tablet 0  . ranitidine (ZANTAC) 150 MG tablet Take 1 tablet (150 mg total) by mouth 2 (two) times daily. 60 tablet 3  . senna (SENOKOT) 8.6 MG TABS tablet Take 1 tablet (8.6 mg total) by mouth daily as needed for mild constipation. 120 each 0  . sertraline (ZOLOFT) 100 MG tablet Take 1 tablet (100 mg total) by mouth daily. 30 tablet 5  . sucralfate (CARAFATE) 1 G tablet Take 1 tablet (1 g total) by  mouth 4 (four) times daily. 30 tablet 0  . traMADol (ULTRAM) 50 MG tablet Take 50 mg by mouth every 6 (six) hours as needed for moderate pain.     No current facility-administered medications for this visit.     Family History  Problem Relation Age of Onset  . Heart disease Mother   . Stroke Mother   . Hypertension Mother   . Heart disease Father   . Colon cancer Father   . Hypertension Father   . Diabetes Father   . Cancer Paternal Grandmother   . Asthma Daughter   . Seizures Daughter   . Hypertension Sister   . Thyroid disease Sister   . Mental illness Daughter 32       suicide attempt 06/2013  . Asthma Sister   . Hypertension Sister   . Breast cancer Paternal Aunt   . Cancer Paternal Aunt     Review of Systems  Constitutional: Negative.   HENT: Negative.   Eyes: Negative.   Respiratory: Negative.   Cardiovascular: Negative.   Gastrointestinal: Negative.   Endocrine: Negative.   Genitourinary: Negative.   Musculoskeletal: Negative.   Skin: Negative.   Allergic/Immunologic: Negative.   Neurological: Negative.   Hematological: Negative.   Psychiatric/Behavioral: Negative.     Exam:   BP 130/78 (BP Location: Right Arm, Patient Position: Sitting, Cuff Size: Large)   Pulse 84   Resp 16   Ht 5\' 3"  (1.6 m)   Wt 260 lb (117.9 kg)   LMP 05/08/2016 (Exact Date)   BMI 46.06 kg/m     General appearance: alert, cooperative and  appears stated age Head: Normocephalic, without obvious abnormality, atraumatic Neck: no adenopathy, supple, symmetrical, trachea midline and thyroid normal to inspection and palpation Lungs: clear to auscultation bilaterally Breasts: normal appearance, no masses or tenderness, No nipple retraction or dimpling, No nipple discharge or bleeding, No axillary or supraclavicular adenopathy Heart: regular rate and rhythm Abdomen: soft, non-tender; no masses, no organomegaly Extremities: extremities normal, atraumatic, no cyanosis or edema Skin: Skin color, texture, turgor normal. No rashes or lesions Lymph nodes: Cervical, supraclavicular, and axillary nodes normal. No abnormal inguinal nodes palpated Neurologic: Grossly normal  Pelvic: External genitalia:  no lesions              Urethra:  normal appearing urethra with no masses, tenderness or lesions              Bartholins and Skenes: normal                 Vagina: normal appearing vagina with normal color and discharge, no lesions              Cervix:  absent              Pap taken: No. Bimanual Exam:  Uterus:  absent               Adnexa: no mass, fullness, tenderness              Rectal exam: Yes.  .  Confirms.              Anus:  normal sphincter tone, no lesions  Chaperone was present for exam.  Assessment:   Well woman visit with normal exam. Status post robotic hysterectomy with bilateral salpingectomy.   Ovaries remain. Menopausal symptoms.  FH colon cancer.   Plan: Mammogram screening.  She will schedule and send report to me.  Recommended self breast awareness. Pap and  HR HPV as above. Guidelines for Calcium, Vitamin D, regular exercise program including cardiovascular and weight bearing exercise. Labs with PCP.  Herbal options to treat menopause discussed.  We reviewed changes with reproductive health, bone health, and cardiovascular health.  Brochure on menopause also given.  Follow up annually and prn.   After  visit summary provided.

## 2018-03-29 IMAGING — MG DIGITAL SCREENING BILATERAL MAMMOGRAM WITH CAD
5 series · 5 of 5 positions shown · non-contrast
Comparison: Previous exam(s).

CLINICAL DATA: Screening.

EXAM:
DIGITAL SCREENING BILATERAL MAMMOGRAM WITH CAD

[L CC]
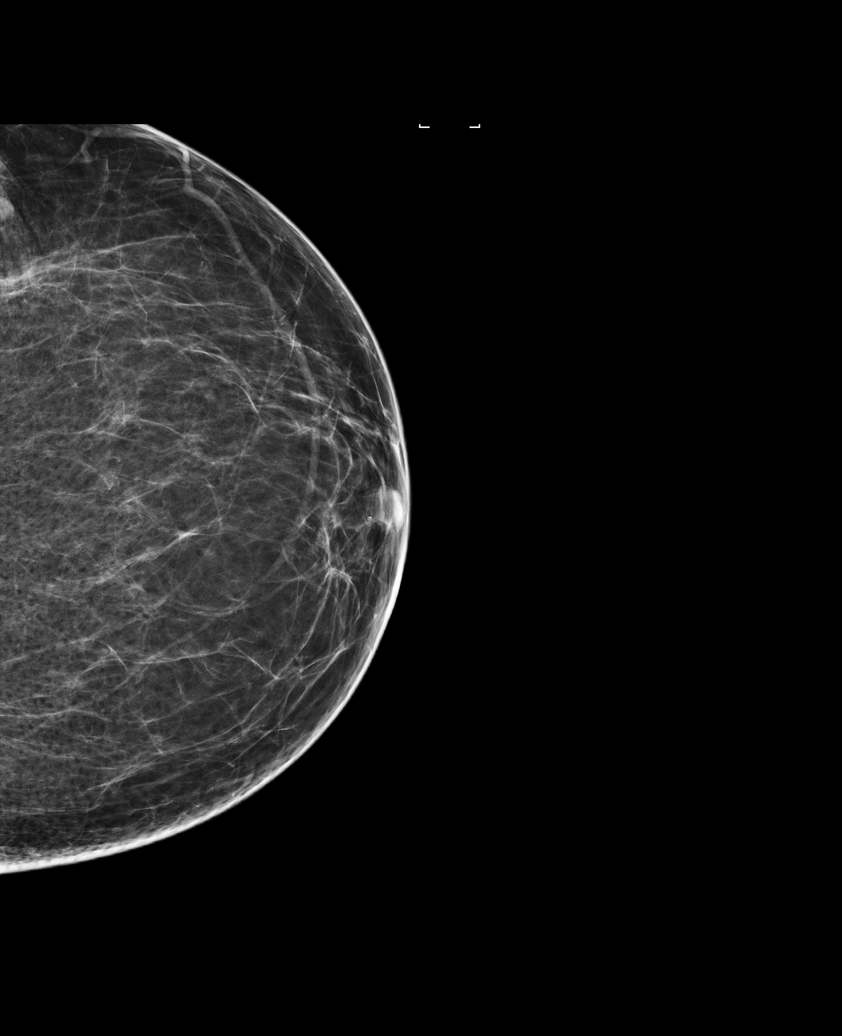

[R MLO]
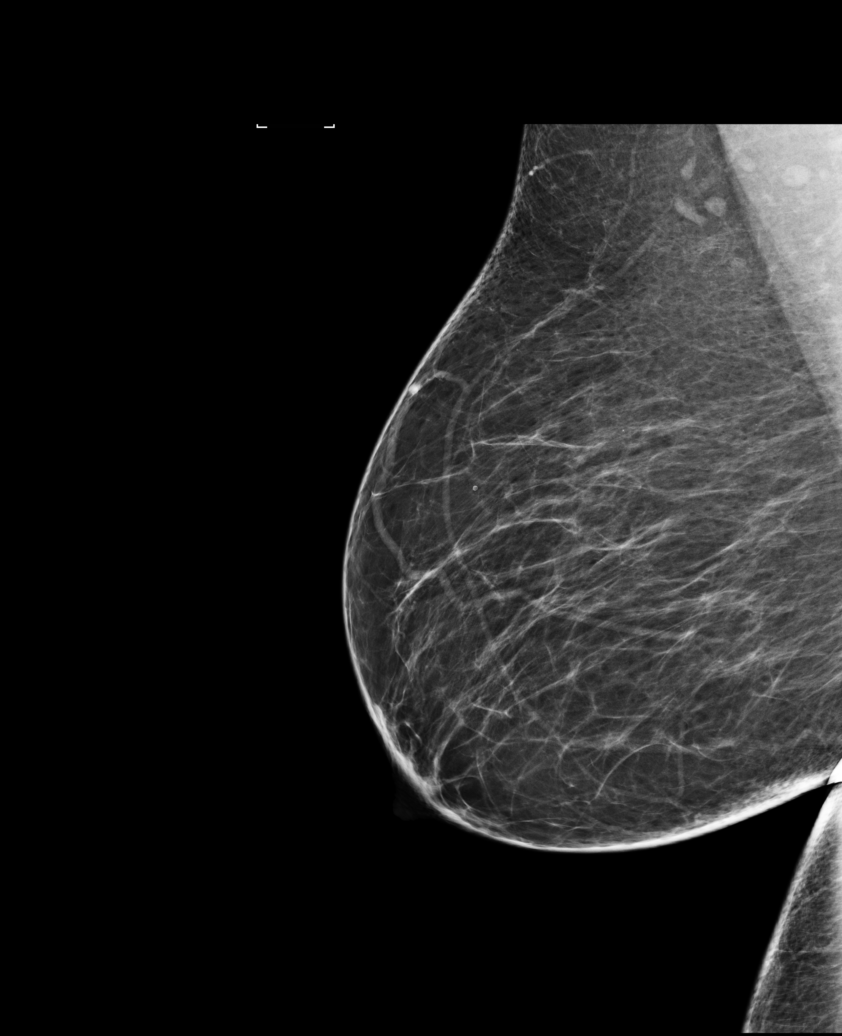

[L MLO (1 of 2)]
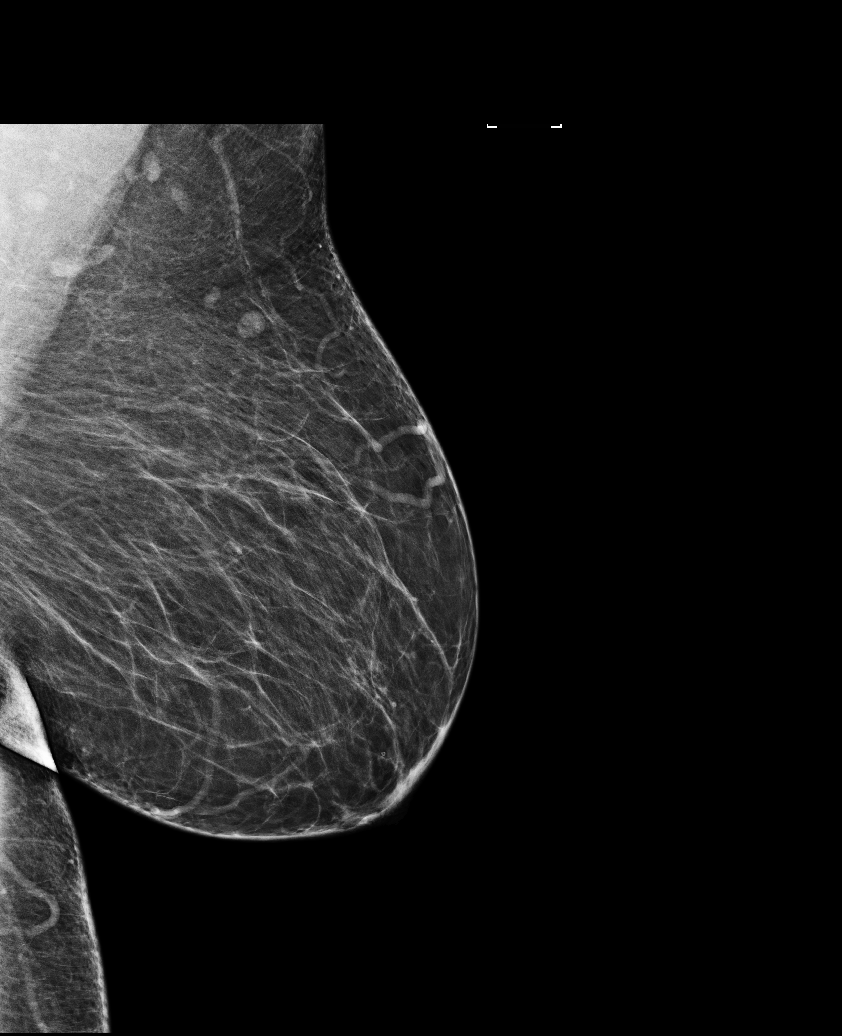

[R CC]
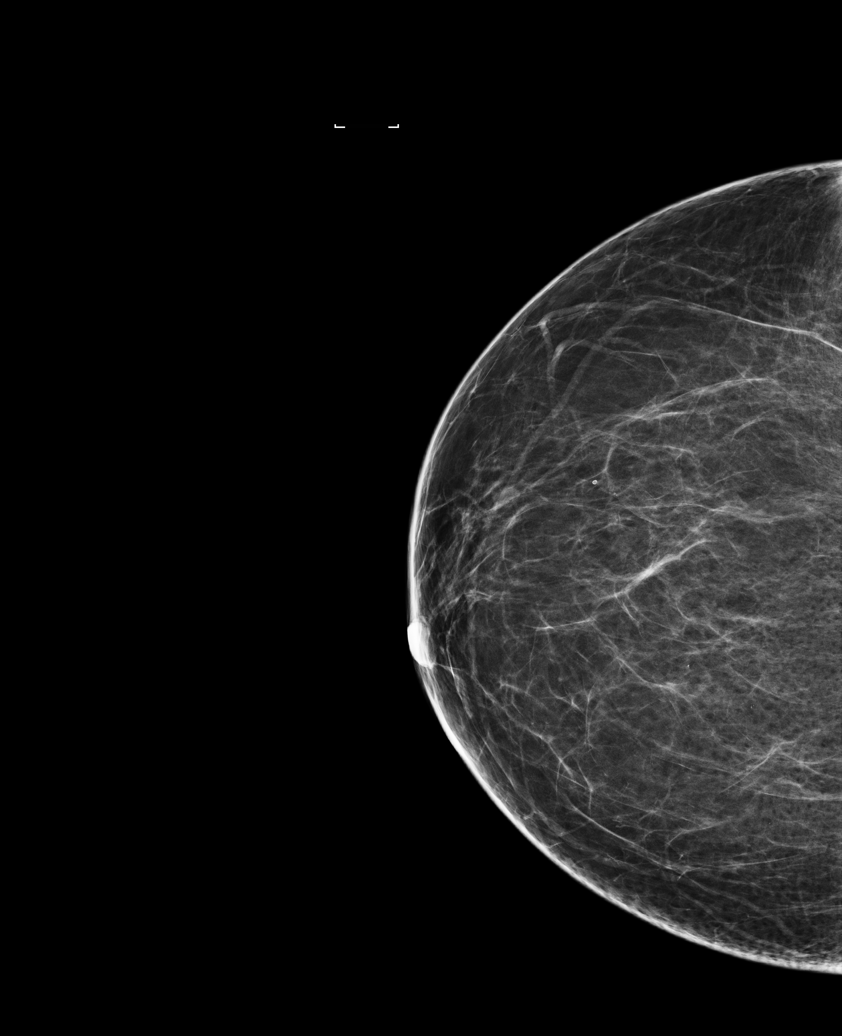

[L MLO (2 of 2)]
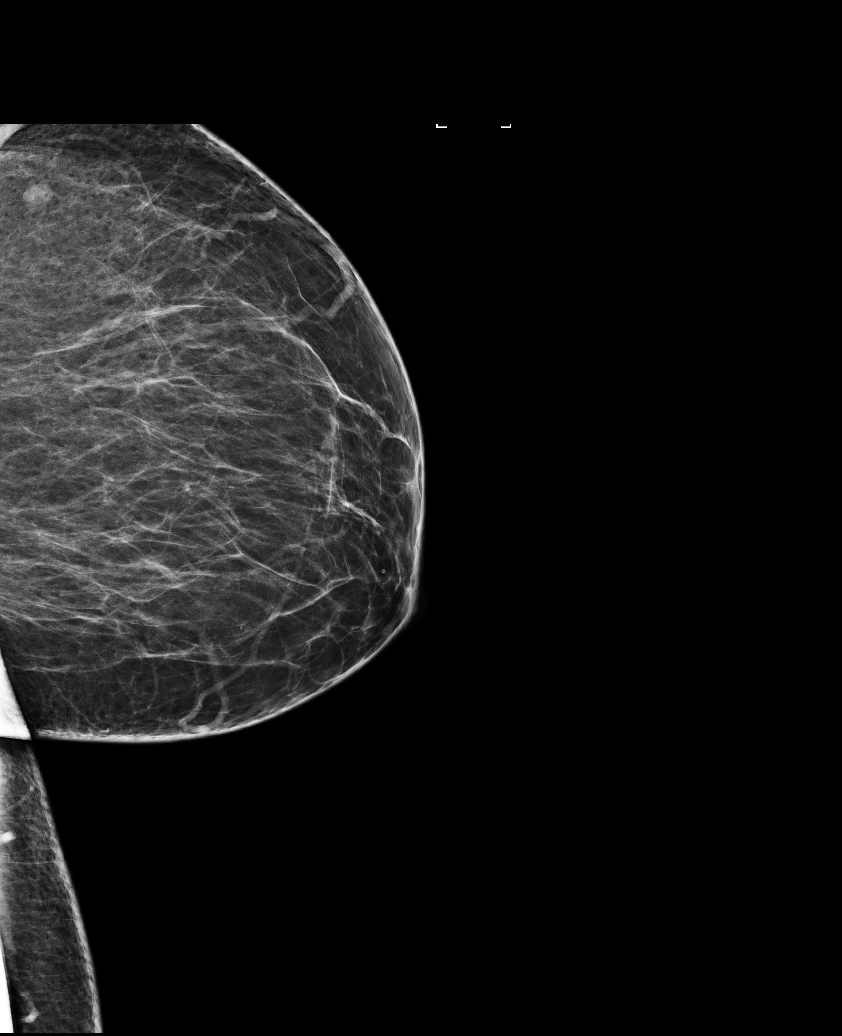

[5 of 5 positions shown; findings below may reference images not displayed]

ACR Breast Density Category b: There are scattered areas of
fibroglandular density.
FINDINGS: There are no findings suspicious for malignancy. Images were
processed with CAD.
IMPRESSION: No mammographic evidence of malignancy. A result letter of this
screening mammogram will be mailed directly to the patient.

RECOMMENDATION:
Screening mammogram in one year. (Code:AS-G-LCT)

BI-RADS CATEGORY  1: Negative.

## 2018-03-31 DIAGNOSIS — R5383 Other fatigue: Secondary | ICD-10-CM | POA: Diagnosis not present

## 2018-03-31 DIAGNOSIS — M5136 Other intervertebral disc degeneration, lumbar region: Secondary | ICD-10-CM | POA: Diagnosis not present

## 2018-03-31 DIAGNOSIS — M791 Myalgia, unspecified site: Secondary | ICD-10-CM | POA: Diagnosis not present

## 2018-03-31 DIAGNOSIS — E039 Hypothyroidism, unspecified: Secondary | ICD-10-CM | POA: Diagnosis not present

## 2018-03-31 DIAGNOSIS — M2550 Pain in unspecified joint: Secondary | ICD-10-CM | POA: Diagnosis not present

## 2018-03-31 DIAGNOSIS — M797 Fibromyalgia: Secondary | ICD-10-CM | POA: Diagnosis not present

## 2018-04-01 DIAGNOSIS — M5136 Other intervertebral disc degeneration, lumbar region: Secondary | ICD-10-CM | POA: Diagnosis not present

## 2018-04-08 ENCOUNTER — Ambulatory Visit: Payer: BLUE CROSS/BLUE SHIELD | Admitting: Adult Health

## 2018-04-11 DIAGNOSIS — M5136 Other intervertebral disc degeneration, lumbar region: Secondary | ICD-10-CM | POA: Diagnosis not present

## 2018-04-11 DIAGNOSIS — M797 Fibromyalgia: Secondary | ICD-10-CM | POA: Diagnosis not present

## 2018-04-11 DIAGNOSIS — F430 Acute stress reaction: Secondary | ICD-10-CM | POA: Diagnosis not present

## 2018-04-21 DIAGNOSIS — Z79899 Other long term (current) drug therapy: Secondary | ICD-10-CM | POA: Diagnosis not present

## 2018-04-21 DIAGNOSIS — F430 Acute stress reaction: Secondary | ICD-10-CM | POA: Diagnosis not present

## 2018-05-02 DIAGNOSIS — M5412 Radiculopathy, cervical region: Secondary | ICD-10-CM | POA: Diagnosis not present

## 2018-05-02 DIAGNOSIS — M5030 Other cervical disc degeneration, unspecified cervical region: Secondary | ICD-10-CM | POA: Diagnosis not present

## 2018-05-03 ENCOUNTER — Emergency Department (HOSPITAL_COMMUNITY): Payer: BLUE CROSS/BLUE SHIELD

## 2018-05-03 ENCOUNTER — Ambulatory Visit (HOSPITAL_COMMUNITY)
Admission: EM | Admit: 2018-05-03 | Discharge: 2018-05-03 | Disposition: A | Discharge status: Home / Self Care | Payer: BLUE CROSS/BLUE SHIELD | Source: Home / Self Care

## 2018-05-03 ENCOUNTER — Encounter (HOSPITAL_COMMUNITY): Payer: Self-pay | Admitting: Emergency Medicine

## 2018-05-03 ENCOUNTER — Emergency Department (HOSPITAL_COMMUNITY)
Admission: EM | Admit: 2018-05-03 | Discharge: 2018-05-03 | Disposition: A | Discharge status: Home / Self Care | Payer: BLUE CROSS/BLUE SHIELD | Attending: Emergency Medicine | Admitting: Emergency Medicine

## 2018-05-03 DIAGNOSIS — J45909 Unspecified asthma, uncomplicated: Secondary | ICD-10-CM | POA: Diagnosis not present

## 2018-05-03 DIAGNOSIS — R531 Weakness: Secondary | ICD-10-CM | POA: Diagnosis not present

## 2018-05-03 DIAGNOSIS — R42 Dizziness and giddiness: Secondary | ICD-10-CM | POA: Diagnosis not present

## 2018-05-03 DIAGNOSIS — E119 Type 2 diabetes mellitus without complications: Secondary | ICD-10-CM | POA: Diagnosis not present

## 2018-05-03 DIAGNOSIS — E039 Hypothyroidism, unspecified: Secondary | ICD-10-CM | POA: Insufficient documentation

## 2018-05-03 DIAGNOSIS — R0789 Other chest pain: Secondary | ICD-10-CM | POA: Diagnosis not present

## 2018-05-03 DIAGNOSIS — Z79899 Other long term (current) drug therapy: Secondary | ICD-10-CM | POA: Diagnosis not present

## 2018-05-03 DIAGNOSIS — I10 Essential (primary) hypertension: Secondary | ICD-10-CM | POA: Diagnosis not present

## 2018-05-03 DIAGNOSIS — R079 Chest pain, unspecified: Secondary | ICD-10-CM

## 2018-05-03 DIAGNOSIS — Z91040 Latex allergy status: Secondary | ICD-10-CM | POA: Insufficient documentation

## 2018-05-03 DIAGNOSIS — R4781 Slurred speech: Secondary | ICD-10-CM | POA: Diagnosis not present

## 2018-05-03 LAB — BASIC METABOLIC PANEL
ANION GAP: 11 (ref 5–15)
BUN: 14 mg/dL (ref 6–20)
CALCIUM: 8.6 mg/dL — AB (ref 8.9–10.3)
CO2: 28 mmol/L (ref 22–32)
Chloride: 101 mmol/L (ref 98–111)
Creatinine, Ser: 0.74 mg/dL (ref 0.44–1.00)
GFR calc non Af Amer: >60 mL/min (ref >60)
Glucose, Bld: 129 mg/dL — ABNORMAL HIGH (ref 70–99)
Potassium: 3.4 mmol/L — ABNORMAL LOW (ref 3.5–5.1)
Sodium: 140 mmol/L (ref 135–145)

## 2018-05-03 LAB — I-STAT TROPONIN, ED
TROPONIN I, POC: 0 ng/mL (ref 0.00–0.08)
Troponin i, poc: 0 ng/mL (ref 0.00–0.08)

## 2018-05-03 LAB — I-STAT BETA HCG BLOOD, ED (MC, WL, AP ONLY)

## 2018-05-03 LAB — CBC
HCT: 40.6 % (ref 36.0–46.0)
HEMOGLOBIN: 12.6 g/dL (ref 12.0–15.0)
MCH: 29.6 pg (ref 26.0–34.0)
MCHC: 31 g/dL (ref 30.0–36.0)
MCV: 95.3 fL (ref 78.0–100.0)
Platelets: 432 10*3/uL — ABNORMAL HIGH (ref 150–400)
RBC: 4.26 MIL/uL (ref 3.87–5.11)
RDW: 14.4 % (ref 11.5–15.5)
WBC: 8.1 10*3/uL (ref 4.0–10.5)

## 2018-05-03 MED ORDER — ASPIRIN 81 MG PO CHEW
324.0000 mg | CHEWABLE_TABLET | Freq: Once | ORAL | Status: AC
  Administered 2018-05-03: 324 mg via ORAL
  Filled 2018-05-03: qty 4

## 2018-05-03 NOTE — ED Provider Notes (Signed)
[facility] EMERGENCY DEPARTMENT Provider Note   CSN: [id number] Arrival date & time: 05/03/18  1317     History   Chief Complaint Chief Complaint  Patient presents with  . Chest Pain    HPI Emily Mathis is a 52 y.o. female.  HPI 52 year old female history of hypertension, diabetes, hypertension, hyperlipidemia who presents today complaining of intermittent chest pain, slurring of words, and increased fatigue.  States that she had some palpitations that began yesterday.  She is unable to tell me how long they lasted.  She states she felt "like she had been running".  There is no real pain associated with this.  It resolved after an unknown amount of time.  This morning around 9 AM she began having some chest pressure.  She felt like she had some palpitations at that time.  Around 11:00 she felt like her words were slurring.  She felt very tired.  She drove herself to the urgent care.  She states that she was seen there and told she need to come to the ED.  She states that she has had some ongoing right hand weakness and was told that she had a loss of dexterity in her hand.  She reports that this has been being evaluated over the past months to a year.  She is not certain whether or not she had an MRI of her head.  She has had some weakness in her right lower extremity that has caused her to have some unequal gait.  This is been going on for an unknown amount of time.  She denies any headache, vision change, new lateralized weakness in her arms or legs.,  Fever, chills, abdominal pain, nausea, vomiting, or diarrhea.  She states her blood sugar was up to 218 yesterday.  Her primary care is no [facility].  She sees Dr. Forde Dandy for diabetes management.  She sees Dr. Rolena Infante for back pain.  She sees a another provider for fibromyalgia. Past Medical History:  Diagnosis Date  . Abnormal Pap smear of cervix 1990   Hx cryotherapy to cervix  . Abnormal uterine bleeding   .  Anemia 02/20/16   Transfusion of 1 unit pRBCs  . Anxiety   . Anxiety and depression   . Arthritis   . Asthma   . 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 Chest pain    per pt due to anemia-   . Diabetes mellitus without complication ([place])   . Fibroid   . Generalized pain   . GERD (gastroesophageal reflux disease)   . Headache(784.0)   . History of blood transfusion 02/20/2016  . Hyperlipidemia   . Hypertension    per Dr. Irven Shelling note 08/30/2011  . Hypothyroidism   . Iron deficiency anemia 02/15/2016  . Leg swelling    bilateral  . Mild scoliosis 08/06/2017  . Neuromuscular disorder ([place])   . Shortness of breath dyspnea    on exertion due to anemia  . Sleep apnea   . Thyroid disease    Hx 3 goiters--hx thyroidectomy--sees Dr. Forde Dandy    Patient Active Problem List   Diagnosis Date Noted  . Acute gouty arthritis 01/01/2018  . Pain of toe of right foot 01/01/2018  . Essential hypertension 01/01/2018  . OSA (obstructive sleep apnea) 11/12/2017  . DJD (degenerative joint disease) of knee 09/13/2017  . DDD (degenerative disc disease), lumbar 09/13/2017  . DDD (degenerative disc disease), cervical 09/13/2017  . Abdominal pain   .  Chronic pancreatitis ([place])   . Constipation   . Status post laparoscopic hysterectomy 06/11/2016  . Myalgia 02/15/2016  . Upper airway cough syndrome 10/21/2015  . Fibromyalgia 06/16/2015  . Type 2 diabetes mellitus without complication, without long-term current use of insulin ([place]) 09/14/2013  . Keloid scar, cervical incision 09/02/2013  . Reaction, situational, acute, to stress 07/02/2013  . 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 BMI 45.0-49.9, adult ([place]) 03/27/2013  . GERD (gastroesophageal reflux disease) 02/15/2013  . Hypocalcemia 01/07/2013  . Chest pain on exertion 07/24/2011    Past Surgical History:  Procedure Laterality Date  . CARDIAC CATHETERIZATION  2013  . CESAREAN SECTION  1995  . CESAREAN SECTION  1996  . COLONOSCOPY      . CYSTOSCOPY N/A 06/11/2016   Procedure: CYSTOSCOPY;  Surgeon: [name], MD;  Location: [facility];  Service: Gynecology;  Laterality: N/A;  . HYSTEROSCOPY W/D&C N/A 04/10/2016   Procedure: HYSTEROSCOPY WITH fractional dilatation and curretage;  Surgeon: [name], MD;  Location: [facility];  Service: Gynecology;  Laterality: N/A;  . LEFT HEART CATHETERIZATION WITH CORONARY ANGIOGRAM  07/24/2011   Procedure: LEFT HEART CATHETERIZATION WITH CORONARY ANGIOGRAM;  Surgeon: Laverda Page, MD;  Location: [facility];  Service: Cardiovascular;;  . ROBOTIC ASSISTED TOTAL HYSTERECTOMY WITH SALPINGECTOMY Bilateral 06/11/2016   Procedure: ROBOTIC ASSISTED TOTAL HYSTERECTOMY WITH SALPINGECTOMY;  Surgeon: [name], MD;  Location: [facility];  Service: Gynecology;  Laterality: Bilateral;  . THYROIDECTOMY N/A 12/18/2012   Procedure: TOTAL THYROIDECTOMY;  Surgeon: Earnstine Regal, MD;  Location: [facility];  Service: General;  Laterality: N/A;  . UPPER GI ENDOSCOPY       OB History    Gravida  3   Para  3   Term  3   Preterm      AB      Living  3     SAB      TAB      Ectopic      Multiple      Live Births               Home Medications    Prior to Admission medications   Medication Sig Start Date End Date Taking? Authorizing Provider  albuterol (PROVENTIL HFA;VENTOLIN HFA) 108 (90 Base) MCG/ACT inhaler Inhale 2 puffs into the lungs every 4 (four) hours as needed for wheezing or shortness of breath (cough, chest tightness). 09/06/17   Harrison Mons, PA-C  ALPRAZolam (XANAX) 0.5 MG tablet TAKE 1 TABLET(0.5 MG) BY MOUTH AT BEDTIME AS NEEDED FOR SLEEP 02/26/18   Jeffery, [name], PA-C  atorvastatin (LIPITOR) 20 MG tablet TAKE 1 TABLET(20 MG) BY MOUTH DAILY 12/24/17   Jeffery, [name], PA-C  Canagliflozin-metFORMIN HCl (INVOKAMET) 150-500 MG TABS Take 1 tablet by mouth daily. 02/26/18   Harrison Mons, PA-C  celecoxib (CELEBREX) 200 MG capsule Take 1 capsule  (200 mg total) by mouth daily. 02/26/18   Harrison Mons, PA-C  chlorthalidone (HYGROTON) 25 MG tablet TAKE 1 TABLET(25 MG) BY MOUTH DAILY 12/24/17   Harrison Mons, PA-C  Cholecalciferol (VITAMIN D3) 50000 units CAPS Take 50,000 Units by mouth every Monday.  06/14/16   [provider]  cyclobenzaprine (FLEXERIL) 10 MG tablet Take 1 tablet (10 mg total) by mouth 3 (three) times daily as needed for muscle spasms. 11/12/16   Jeffery, [name], PA-C  diazepam (VALIUM) 5 MG tablet TAKE 1 TABLET(5 MG) BY MOUTH DAILY AS NEEDED FOR MUSCLE SPASMS  02/26/18   Harrison Mons, PA-C  diclofenac (VOLTAREN) 75 MG EC tablet Take 1 tablet twice daily as needed for pain. 08/15/17   [provider]  docusate sodium (COLACE) 250 MG capsule Take 1 capsule (250 mg total) by mouth daily. Patient taking differently: Take 250 mg by mouth daily as needed for constipation.  09/06/16   Harrison Mons, PA-C  ferrous sulfate 325 (65 FE) MG tablet Take 325 mg by mouth 3 (three) times daily with meals.    [provider]  glycerin adult 2 g suppository Place 1 suppository rectally as needed for constipation.    [provider]  HYDROcodone-acetaminophen (NORCO) 5-325 MG tablet Take 1 tablet by mouth every 6 (six) hours as needed for moderate pain. 01/01/18   Horald Pollen, MD  levothyroxine (SYNTHROID, LEVOTHROID) 300 MCG tablet Take 1 tablet (300 mcg total) by mouth daily. 06/17/15   Jeffery, [name], PA-C  lisinopril (PRINIVIL,ZESTRIL) 5 MG tablet Take 1 tablet (5 mg total) by mouth daily. 12/24/17   Harrison Mons, PA-C  nystatin (MYCOSTATIN) powder Apply topically 4 (four) times daily. Patient taking differently: Apply topically 4 (four) times daily as needed (For heat rash.).  10/02/15   Harrison Mons, PA-C  polyethylene glycol (MIRALAX / GLYCOLAX) packet Take 17 g by mouth 2 (two) times daily. Take 2 packets today (day one), wait 2 hours, if no stool produced then take 2 more packets on day  one; then continue taking 2 packets daily until you achieve daily soft stools; if water stool develops then cut back to 1 packet daily. Patient taking differently: Take 17 g by mouth daily as needed for mild constipation.  07/24/16   Street, [place], PA-C  potassium chloride SA (K-DUR,KLOR-CON) 20 MEQ tablet Take 1 tablet (20 mEq total) by mouth 2 (two) times daily. 06/12/16   [name], MD  ranitidine (ZANTAC) 150 MG tablet Take 1 tablet (150 mg total) by mouth 2 (two) times daily. 02/05/17   Harrison Mons, PA-C  senna (SENOKOT) 8.6 MG TABS tablet Take 1 tablet (8.6 mg total) by mouth daily as needed for mild constipation. 08/22/16   Mikell, Jeani Sow, MD  sertraline (ZOLOFT) 100 MG tablet Take 1 tablet (100 mg total) by mouth daily. 12/24/17   Harrison Mons, PA-C  sucralfate (CARAFATE) 1 G tablet Take 1 tablet (1 g total) by mouth 4 (four) times daily. 04/19/15   Linton Flemings, MD  traMADol (ULTRAM) 50 MG tablet Take 50 mg by mouth every 6 (six) hours as needed for moderate pain.    [provider]    Family History Family History  Problem Relation Age of Onset  . Heart disease Mother   . Stroke Mother   . Hypertension Mother   . Heart disease Father   . Colon cancer Father   . Hypertension Father   . Diabetes Father   . Cancer Paternal Grandmother   . Asthma Daughter   . Seizures Daughter   . Hypertension Sister   . Thyroid disease Sister   . Mental illness Daughter 64       suicide attempt 06/2013  . Asthma Sister   . Hypertension Sister   . Breast cancer Paternal Aunt   . Cancer Paternal Aunt     Social History Social History   Tobacco Use  . Smoking status: Never Smoker  . Smokeless tobacco: Never Used  Substance Use Topics  . Alcohol use: Yes    Alcohol/week: 0.0 standard drinks  Frequency: Never    Comment: Occasional glass of wine  . Drug use: No     Allergies   Dilaudid [hydromorphone hcl]; Hydrocodone; and Latex   Review of  Systems Review of Systems  All other systems reviewed and are negative.    Physical Exam Updated Vital Signs BP 123/88   Pulse 88   Resp 20   LMP 05/08/2016 (Exact Date)   SpO2 100%   Physical Exam  Constitutional: She is oriented to person, place, and time. She appears well-developed and well-nourished.  HENT:  Head: Normocephalic and atraumatic.  Eyes: Pupils are equal, round, and reactive to light. EOM are normal.  Neck: Normal range of motion. Neck supple.  Cardiovascular: Normal rate, regular rhythm, intact distal pulses and normal pulses.  Pulmonary/Chest: Effort normal and breath sounds normal.  Abdominal: Soft. Bowel sounds are normal.  Musculoskeletal: Normal range of motion.       Right lower leg: Normal.       Left lower leg: Normal.  Neurological: She is alert and oriented to person, place, and time.  Some right palmar drift No sensory deficit noted Face is symmetric Extraocular movements are intact and no visual field deficit is noted  Skin: Skin is warm and dry. Capillary refill takes less than 2 seconds.  Psychiatric: She has a normal mood and affect. Her behavior is normal.  Nursing note and vitals reviewed.    ED Treatments / Results  Labs (all labs ordered are listed, but only abnormal results are displayed) Labs Reviewed  BASIC METABOLIC PANEL - Abnormal; Notable for the following components:      Result Value   Potassium 3.4 (*)    Glucose, Bld 129 (*)    Calcium 8.6 (*)    All other components within normal limits  CBC - Abnormal; Notable for the following components:   Platelets 432 (*)    All other components within normal limits  I-STAT TROPONIN, ED  I-STAT BETA HCG BLOOD, ED ([facility], [facility], [facility] ONLY)    EKG EKG Interpretation  Date/Time:  Saturday May 03 2018 13:23:59 EDT Ventricular Rate:  99 PR Interval:  126 QRS Duration: 72 QT Interval:  338 QTC Calculation: 433 R Axis:   48 Text Interpretation:  Normal sinus rhythm Nonspecific  ST abnormality Abnormal ECG Confirmed by Pattricia Boss [phone]) on 05/03/2018 3:44:36 PM   Radiology Dg Chest 2 View  Result Date: 05/03/2018 CLINICAL DATA:  Chest pain EXAM: CHEST - 2 VIEW COMPARISON:  03/24/2018 FINDINGS: The heart size and mediastinal contours are within normal limits. Both lungs are clear. The visualized skeletal structures are unremarkable. IMPRESSION: No active cardiopulmonary disease. Electronically Signed   By: Jerilynn Mages.  Shick M.D.   On: 05/03/2018 14:17    Procedures Procedures (including critical care time)  Medications Ordered in ED Medications - No data to display   Initial Impression / Assessment and Plan / ED Course  I have reviewed the triage vital signs and the nursing notes.  Pertinent labs & imaging results that were available during my care of the patient were reviewed by me and considered in my medical decision making (see chart for details).     1 word slurring differential diagnosis including stroke, MS, hypoglycemia, drug or alcohol induced.  Patient is diabetic but is currently hypoglycemic and had no other associated symptoms to coincide with hypoglycemia.  Had no lateralized deficits other than right arm and leg weakness that has been present for an extended period of time and I  would expect to show as a stroke already on a CT.  I discussed with patient the need to follow-up with her primary care doctor for possible MRI as an outpatient 2 chest pain patient had palpitations yesterday and some chest pain earlier today which is resolved without intervention.  Her EKG is normal.  Troponin and repeat troponin are normal.  Doubt dissection, MI, or acute coronary syndrome based on patient's presentation, with a history of her present illness, and her other presenting symptoms. Reviewed patient's lab, x-rays, EKG and discussed findings with the patient.  We have discussed return precautions and need for close follow-up and she voices understanding.  Final  Clinical Impressions(s) / ED Diagnoses   Final diagnoses:  Weakness  Chest pain, unspecified type    ED Discharge Orders    None       Pattricia Boss, MD 05/03/18 [id number]

## 2018-05-03 NOTE — Discharge Instructions (Signed)
Please return if you are worse at any time Please follow-up with your doctor next week

## 2018-05-03 NOTE — ED Triage Notes (Signed)
Pt presents to ED for assessment from [facility] for central chest pain intermittent x 1 week, with intermittent slurred speech and dizziness x 1 month.  Pt with a hx of MS and concerned for "demyelinization".  C/o SOB, denies n/v   Patient seen by her neurologist multiple times for same, told to come to ED next time she had symptoms.

## 2018-05-03 NOTE — ED Notes (Signed)
Pt here for CP and slurred speech, no other neuro deficits. Per Amy, pt is Not code stroke, EKG taken, given to AMy, will wheel pt to ER.

## 2018-05-07 ENCOUNTER — Other Ambulatory Visit: Payer: Self-pay | Admitting: Emergency Medicine

## 2018-05-09 DIAGNOSIS — M542 Cervicalgia: Secondary | ICD-10-CM | POA: Diagnosis not present

## 2018-05-16 DIAGNOSIS — M5030 Other cervical disc degeneration, unspecified cervical region: Secondary | ICD-10-CM | POA: Diagnosis not present

## 2018-05-30 DIAGNOSIS — M542 Cervicalgia: Secondary | ICD-10-CM | POA: Diagnosis not present

## 2018-05-30 DIAGNOSIS — M5412 Radiculopathy, cervical region: Secondary | ICD-10-CM | POA: Diagnosis not present

## 2018-05-30 DIAGNOSIS — M5033 Other cervical disc degeneration, cervicothoracic region: Secondary | ICD-10-CM | POA: Diagnosis not present

## 2018-05-30 DIAGNOSIS — M5010 Cervical disc disorder with radiculopathy, unspecified cervical region: Secondary | ICD-10-CM | POA: Diagnosis not present

## 2018-07-01 DIAGNOSIS — E119 Type 2 diabetes mellitus without complications: Secondary | ICD-10-CM | POA: Diagnosis not present

## 2018-07-01 DIAGNOSIS — Z23 Encounter for immunization: Secondary | ICD-10-CM | POA: Diagnosis not present

## 2018-07-01 DIAGNOSIS — E890 Postprocedural hypothyroidism: Secondary | ICD-10-CM | POA: Diagnosis not present

## 2018-07-01 DIAGNOSIS — M797 Fibromyalgia: Secondary | ICD-10-CM | POA: Diagnosis not present

## 2018-07-01 DIAGNOSIS — I10 Essential (primary) hypertension: Secondary | ICD-10-CM | POA: Diagnosis not present

## 2018-07-07 DIAGNOSIS — Z111 Encounter for screening for respiratory tuberculosis: Secondary | ICD-10-CM | POA: Diagnosis not present

## 2018-07-15 DIAGNOSIS — R768 Other specified abnormal immunological findings in serum: Secondary | ICD-10-CM | POA: Diagnosis not present

## 2018-07-15 DIAGNOSIS — M0600 Rheumatoid arthritis without rheumatoid factor, unspecified site: Secondary | ICD-10-CM | POA: Diagnosis not present

## 2018-07-15 DIAGNOSIS — M150 Primary generalized (osteo)arthritis: Secondary | ICD-10-CM | POA: Diagnosis not present

## 2018-07-15 DIAGNOSIS — M5136 Other intervertebral disc degeneration, lumbar region: Secondary | ICD-10-CM | POA: Diagnosis not present

## 2018-07-15 DIAGNOSIS — M797 Fibromyalgia: Secondary | ICD-10-CM | POA: Diagnosis not present

## 2018-07-18 ENCOUNTER — Other Ambulatory Visit: Payer: Self-pay | Admitting: Obstetrics and Gynecology

## 2018-07-18 DIAGNOSIS — Z1231 Encounter for screening mammogram for malignant neoplasm of breast: Secondary | ICD-10-CM

## 2018-07-21 DIAGNOSIS — M5136 Other intervertebral disc degeneration, lumbar region: Secondary | ICD-10-CM | POA: Diagnosis not present

## 2018-08-12 DIAGNOSIS — G5601 Carpal tunnel syndrome, right upper limb: Secondary | ICD-10-CM | POA: Diagnosis not present

## 2018-08-25 DIAGNOSIS — G5601 Carpal tunnel syndrome, right upper limb: Secondary | ICD-10-CM | POA: Diagnosis not present

## 2018-09-02 ENCOUNTER — Ambulatory Visit
Admission: RE | Admit: 2018-09-02 | Discharge: 2018-09-02 | Disposition: A | Discharge status: Home / Self Care | Payer: BLUE CROSS/BLUE SHIELD | Source: Ambulatory Visit | Attending: Obstetrics and Gynecology | Admitting: Obstetrics and Gynecology

## 2018-09-02 DIAGNOSIS — Z1231 Encounter for screening mammogram for malignant neoplasm of breast: Secondary | ICD-10-CM | POA: Diagnosis not present

## 2018-09-04 DIAGNOSIS — G5601 Carpal tunnel syndrome, right upper limb: Secondary | ICD-10-CM | POA: Diagnosis not present

## 2018-09-05 DIAGNOSIS — M5412 Radiculopathy, cervical region: Secondary | ICD-10-CM | POA: Diagnosis not present

## 2018-09-12 DIAGNOSIS — M5412 Radiculopathy, cervical region: Secondary | ICD-10-CM | POA: Diagnosis not present

## 2018-09-19 DIAGNOSIS — M5412 Radiculopathy, cervical region: Secondary | ICD-10-CM | POA: Diagnosis not present

## 2018-09-22 DIAGNOSIS — M5412 Radiculopathy, cervical region: Secondary | ICD-10-CM | POA: Diagnosis not present

## 2018-11-24 ENCOUNTER — Telehealth: Payer: Self-pay | Admitting: Obstetrics and Gynecology

## 2018-11-24 NOTE — Telephone Encounter (Signed)
Patient had a total hysterectomy about 3 years ago and started bleeding on Saturday. She says the bleeding has stopped.

## 2018-11-24 NOTE — Telephone Encounter (Signed)
Spoke with patient. S/p TLH with BS. Patient  reports spotting on 3/7 and mild cramping. Spotting has since resolved, cramping still present. Denies urinary symptoms, lower back pain, fever/chills, or vaginal odor. Had intercourse 1 wk prior to symptoms, no change in partners.   Last AEX 03/28/18. Recommended OV for further evaluation, offered OV for 3/10 at 1030 with Dr. Quincy Simmonds, patient declined. Patient has questions about insurance prior to scheduling, call forwarded to front desk for further assistance.   See account notes, OV scheduled for 3/10 at 1030 with Dr. Quincy Simmonds.   Routing to provider for final review. Patient is agreeable to disposition. Will close encounter.  Cc: Thayer Ohm

## 2018-11-25 ENCOUNTER — Ambulatory Visit (INDEPENDENT_AMBULATORY_CARE_PROVIDER_SITE_OTHER): Payer: BLUE CROSS/BLUE SHIELD | Admitting: Obstetrics and Gynecology

## 2018-11-25 ENCOUNTER — Encounter: Payer: Self-pay | Admitting: Obstetrics and Gynecology

## 2018-11-25 ENCOUNTER — Other Ambulatory Visit: Payer: Self-pay

## 2018-11-25 VITALS — BP 112/72 | HR 70 | Resp 16 | Wt 263.0 lb

## 2018-11-25 DIAGNOSIS — N952 Postmenopausal atrophic vaginitis: Secondary | ICD-10-CM | POA: Diagnosis not present

## 2018-11-25 DIAGNOSIS — R6882 Decreased libido: Secondary | ICD-10-CM | POA: Diagnosis not present

## 2018-11-25 DIAGNOSIS — N939 Abnormal uterine and vaginal bleeding, unspecified: Secondary | ICD-10-CM | POA: Diagnosis not present

## 2018-11-25 DIAGNOSIS — K5900 Constipation, unspecified: Secondary | ICD-10-CM

## 2018-11-25 LAB — POCT URINALYSIS DIPSTICK
Bilirubin, UA: NEGATIVE
Blood, UA: NEGATIVE
Glucose, UA: POSITIVE — AB
Ketones, UA: NEGATIVE
Leukocytes, UA: NEGATIVE
NITRITE UA: NEGATIVE
PROTEIN UA: NEGATIVE
Urobilinogen, UA: 0.2 E.U./dL
pH, UA: 5 (ref 5.0–8.0)

## 2018-11-25 MED ORDER — ESTROGENS, CONJUGATED 0.625 MG/GM VA CREA: TOPICAL_CREAM | VAGINAL | 1 refills | Status: AC

## 2018-11-25 NOTE — Patient Instructions (Signed)
Constipation, Adult Constipation is when a person has fewer bowel movements in a week than normal, has difficulty having a bowel movement, or has stools that are dry, hard, or larger than normal. Constipation may be caused by an underlying condition. It may become worse with age if a person takes certain medicines and does not take in enough fluids. Follow these instructions at home: Eating and drinking   Eat foods that have a lot of fiber, such as fresh fruits and vegetables, whole grains, and beans.  Limit foods that are high in fat, low in fiber, or overly processed, such as french fries, hamburgers, cookies, candies, and soda.  Drink enough fluid to keep your urine clear or pale yellow. General instructions  Exercise regularly or as told by your health care provider.  Go to the restroom when you have the urge to go. Do not hold it in.  Take over-the-counter and prescription medicines only as told by your health care provider. These include any fiber supplements.  Practice pelvic floor retraining exercises, such as deep breathing while relaxing the lower abdomen and pelvic floor relaxation during bowel movements.  Watch your condition for any changes.  Keep all follow-up visits as told by your health care provider. This is important. Contact a health care provider if:  You have pain that gets worse.  You have a fever.  You do not have a bowel movement after 4 days.  You vomit.  You are not hungry.  You lose weight.  You are bleeding from the anus.  You have thin, pencil-like stools. Get help right away if:  You have a fever and your symptoms suddenly get worse.  You leak stool or have blood in your stool.  Your abdomen is bloated.  You have severe pain in your abdomen.  You feel dizzy or you faint. This information is not intended to replace advice given to you by your health care provider. Make sure you discuss any questions you have with your health care  provider. Document Released: 06/01/2004 Document Revised: 03/23/2016 Document Reviewed: 02/22/2016 Elsevier Interactive Patient Education  2019 [employer] Atrophic Vaginitis  Atrophic vaginitis is a condition in which the tissues that line the vagina become dry and thin. This condition is most common in women who have stopped having regular menstrual periods (are in menopause). This usually starts when a woman is 22-39 years old. That is the time when a woman's estrogen levels begin to drop (decrease). Estrogen is a female hormone. It helps to keep the tissues of the vagina moist. It stimulates the vagina to produce a clear fluid that lubricates the vagina for sexual intercourse. This fluid also protects the vagina from infection. Lack of estrogen can cause the lining of the vagina to get thinner and dryer. The vagina may also shrink in size. It may become less elastic. Atrophic vaginitis tends to get worse over time as a woman's estrogen level drops. What are the causes? This condition is caused by the normal drop in estrogen that happens around the time of menopause. What increases the risk? Certain conditions or situations may lower a woman's estrogen level, leading to a higher risk for atrophic vaginitis. You are more likely to develop this condition if:  You are taking medicines that block estrogen.  You have had your ovaries removed.  You are being treated for cancer with X-ray (radiation) or medicines (chemotherapy).  You have given birth or are breastfeeding.  You are older than age 31.  You  smoke. What are the signs or symptoms? Symptoms of this condition include:  Pain, soreness, or bleeding during sexual intercourse (dyspareunia).  Vaginal burning, irritation, or itching.  Pain or bleeding when a speculum is used in a vaginal exam (pelvic exam).  Having burning pain when passing urine.  Vaginal discharge that is brown or yellow. In some cases, there are no  symptoms. How is this diagnosed? This condition is diagnosed by taking a medical history and doing a physical exam. This will include a pelvic exam that checks the vaginal tissues. Though rare, you may also have other tests, including:  A urine test.  A test that checks the acid balance in your vagina (acid balance test). How is this treated? Treatment for this condition depends on how severe your symptoms are. Treatment may include:  Using an over-the-counter vaginal lubricant before sex.  Using a long-acting vaginal moisturizer.  Using low-dose vaginal estrogen for moderate to severe symptoms that do not respond to other treatments. Options include creams, tablets, and inserts (vaginal rings). Before you use a vaginal estrogen, tell your health care provider if you have a history of: ? Breast cancer. ? Endometrial cancer. ? Blood clots. If you are not sexually active and your symptoms are very mild, you may not need treatment. Follow these instructions at home: Medicines  Take over-the-counter and prescription medicines only as told by your health care provider. Do not use herbal or alternative medicines unless your health care provider says that you can.  Use over-the-counter creams, lubricants, or moisturizers for dryness only as directed by your health care provider. General instructions  If your atrophic vaginitis is caused by menopause, discuss all of your menopause symptoms and treatment options with your health care provider.  Do not douche.  Do not use products that can make your vagina dry. These include: ? Scented feminine sprays. ? Scented tampons. ? Scented soaps.  Vaginal intercourse can help to improve blood flow and elasticity of vaginal tissue. If it hurts to have sex, try using a lubricant or moisturizer just before having intercourse. Contact a health care provider if:  Your discharge looks different than normal.  Your vagina has an unusual smell.  You  have new symptoms.  Your symptoms do not improve with treatment.  Your symptoms get worse. Summary  Atrophic vaginitis is a condition in which the tissues that line the vagina become dry and thin. It is most common in women who have stopped having regular menstrual periods (are in menopause).  Treatment options include using vaginal lubricants and low-dose vaginal estrogen.  Contact a health care provider if your vagina has an unusual smell, or if your symptoms get worse or do not improve after treatment. This information is not intended to replace advice given to you by your health care provider. Make sure you discuss any questions you have with your health care provider. Document Released: 01/18/2015 Document Revised: 05/30/2017 Document Reviewed: 05/30/2017 Elsevier Interactive Patient Education  2019 [employer].

## 2018-11-25 NOTE — Progress Notes (Signed)
GYNECOLOGY  VISIT   HPI: 53 y.o.   Single  African American  female   [phone] with Patient's last menstrual period was 05/08/2016 (exact date).   here for  Vaginal bleeding & cramping.  Started bleeding 3 days ago and it stopped.  Last sexual intercourse was one week prior.  Straining to have a bowel movement. Does have blood in her stool if she strains.  Colonoscopy 2017 - normal.  Sexually active with steady partner. Does feel some kind of "pop" and dryness.  Can be painful. Decreased desire.  Caring for grandhildren.   No vaginal discharge or odor. Irritation with shaving.   Feels hot at night.   Denies dysuria.  No hematuria.  No hx renal stones.   GYNECOLOGIC HISTORY: Patient's last menstrual period was 05/08/2016 (exact date). Contraception: hysterectomy.  Benign cervix, fibroids, benign tubes.  Menopausal hormone therapy:  NA Last mammogram:  09/02/2018 - BI-RADS1. Last pap smear:   02/2016 - normal, negative HR HPV. Hx abnormal paps in the remote past.  Had cryotherapy.  Thinks she had abnormal paps following this.         OB History    Gravida  3   Para  3   Term  3   Preterm      AB      Living  3     SAB      TAB      Ectopic      Multiple      Live Births                 Patient Active Problem List   Diagnosis Date Noted  . Acute gouty arthritis 01/01/2018  . Pain of toe of right foot 01/01/2018  . Essential hypertension 01/01/2018  . OSA (obstructive sleep apnea) 11/12/2017  . DJD (degenerative joint disease) of knee 09/13/2017  . DDD (degenerative disc disease), lumbar 09/13/2017  . DDD (degenerative disc disease), cervical 09/13/2017  . Abdominal pain   . Chronic pancreatitis ([place])   . Constipation   . Status post laparoscopic hysterectomy 06/11/2016  . Myalgia 02/15/2016  . Upper airway cough syndrome 10/21/2015  . Fibromyalgia 06/16/2015  . Type 2 diabetes mellitus without complication, without long-term current use of  insulin ([place]) 09/14/2013  . Keloid scar, cervical incision 09/02/2013  . Reaction, situational, acute, to stress 07/02/2013  . 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 BMI 45.0-49.9, adult ([place]) 03/27/2013  . GERD (gastroesophageal reflux disease) 02/15/2013  . Hypocalcemia 01/07/2013  . Chest pain on exertion 07/24/2011    Past Medical History:  Diagnosis Date  . Abnormal Pap smear of cervix 1990   Hx cryotherapy to cervix  . Abnormal uterine bleeding   . Anemia 02/20/16   Transfusion of 1 unit pRBCs  . Anxiety   . Anxiety and depression   . Arthritis   . Asthma   . 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 Chest pain    per pt due to anemia-   . Diabetes mellitus without complication ([place])   . Fibroid   . Fibromyalgia   . Generalized pain   . GERD (gastroesophageal reflux disease)   . Gout    in big toe  . Headache(784.0)   . History of blood transfusion 02/20/2016  . Hyperlipidemia   . Hypertension    per Dr. Irven Shelling note 08/30/2011  . Hypothyroidism   . Iron deficiency anemia 02/15/2016  . Leg swelling    bilateral  . Mild  scoliosis 08/06/2017  . Neuromuscular disorder ([place])   . Osteoarthritis    in knees  . Rheumatoid arthritis ([place])   . Scoliosis    mild  . Shortness of breath dyspnea    on exertion due to anemia  . Sleep apnea    cpap machine  . Thyroid disease    Hx 3 goiters--hx thyroidectomy--sees Dr. Forde Dandy    Past Surgical History:  Procedure Laterality Date  . CARDIAC CATHETERIZATION  2013  . CESAREAN SECTION  1995  . CESAREAN SECTION  1996  . COLONOSCOPY    . CYSTOSCOPY N/A 06/11/2016   Procedure: CYSTOSCOPY;  Surgeon: [name], MD;  Location: [facility];  Service: Gynecology;  Laterality: N/A;  . HYSTEROSCOPY W/D&C N/A 04/10/2016   Procedure: HYSTEROSCOPY WITH fractional dilatation and curretage;  Surgeon: [name], MD;  Location: [facility];  Service: Gynecology;  Laterality: N/A;  . LEFT HEART CATHETERIZATION WITH  CORONARY ANGIOGRAM  07/24/2011   Procedure: LEFT HEART CATHETERIZATION WITH CORONARY ANGIOGRAM;  Surgeon: Laverda Page, MD;  Location: [facility] C[facility] LAB;  Service: Cardiovascular;;  . ROBOTIC ASSISTED TOTAL HYSTERECTOMY WITH SALPINGECTOMY Bilateral 06/11/2016   Procedure: ROBOTIC ASSISTED TOTAL HYSTERECTOMY WITH SALPINGECTOMY;  Surgeon: [name], MD;  Location: [facility];  Service: Gynecology;  Laterality: Bilateral;  . THYROIDECTOMY N/A 12/18/2012   Procedure: TOTAL THYROIDECTOMY;  Surgeon: Earnstine Regal, MD;  Location: [facility];  Service: General;  Laterality: N/A;  . UPPER GI ENDOSCOPY      Current Outpatient Medications  Medication Sig Dispense Refill  . albuterol (PROVENTIL HFA;VENTOLIN HFA) 108 (90 Base) MCG/ACT inhaler Inhale 2 puffs into the lungs every 4 (four) hours as needed for wheezing or shortness of breath (cough, chest tightness). (Patient taking differently: Inhale 2 puffs into the lungs every 4 (four) hours as needed for wheezing or shortness of breath (cough, chest tightness). ProAir) 18 g 1  . ALPRAZolam (XANAX) 0.5 MG tablet TAKE 1 TABLET(0.5 MG) BY MOUTH AT BEDTIME AS NEEDED FOR SLEEP (Patient taking differently: Take 0.5 mg by mouth 2 (two) times daily as needed for anxiety. ) 30 tablet 0  . atorvastatin (LIPITOR) 20 MG tablet TAKE 1 TABLET(20 MG) BY MOUTH DAILY (Patient taking differently: Take 20 mg by mouth at bedtime. ) 90 tablet 3  . Canagliflozin-metFORMIN HCl (INVOKAMET) 150-500 MG TABS Take 1 tablet by mouth daily. 30 tablet 0  . celecoxib (CELEBREX) 200 MG capsule Take 1 capsule (200 mg total) by mouth daily. 60 capsule 2  . chlorthalidone (HYGROTON) 25 MG tablet TAKE 1 TABLET(25 MG) BY MOUTH DAILY (Patient taking differently: Take 25 mg by mouth daily. ) 90 tablet 3  . cyclobenzaprine (FLEXERIL) 10 MG tablet Take 1 tablet (10 mg total) by mouth 3 (three) times daily as needed for muscle spasms. 30 tablet 0  . diazepam (VALIUM) 5 MG tablet TAKE 1 TABLET(5 MG)  BY MOUTH DAILY AS NEEDED FOR MUSCLE SPASMS (Patient taking differently: Take 5 mg by mouth 2 (two) times daily as needed for muscle spasms. ) 30 tablet 0  . docusate sodium (COLACE) 250 MG capsule Take 1 capsule (250 mg total) by mouth daily. 30 capsule 0  . etanercept (ENBREL) 50 MG/ML injection Inject 50 mg into the skin every Sunday.    Marland Kitchen ibuprofen (ADVIL,MOTRIN) 200 MG tablet Take 400 mg by mouth every 6 (six) hours as needed for headache (pain).    Marland Kitchen levothyroxine (SYNTHROID, LEVOTHROID) 300 MCG tablet Take  1 tablet (300 mcg total) by mouth daily. (Patient taking differently: Take 300 mcg by mouth daily before breakfast. ) 90 tablet 3  . lisinopril (PRINIVIL,ZESTRIL) 5 MG tablet Take 1 tablet (5 mg total) by mouth daily. 90 tablet 3  . nystatin (MYCOSTATIN) powder Apply topically 4 (four) times daily. (Patient taking differently: Apply topically 4 (four) times daily as needed (heat rash). ) 60 g 2  . polyethylene glycol (MIRALAX / GLYCOLAX) packet Take 17 g by mouth 2 (two) times daily. Take 2 packets today (day one), wait 2 hours, if no stool produced then take 2 more packets on day one; then continue taking 2 packets daily until you achieve daily soft stools; if water stool develops then cut back to 1 packet daily. (Patient taking differently: Take 17 g by mouth daily as needed for mild constipation. ) 14 each 0  . potassium chloride SA (K-DUR,KLOR-CON) 20 MEQ tablet Take 1 tablet (20 mEq total) by mouth 2 (two) times daily. (Patient taking differently: Take 20 mEq by mouth as needed. ) 60 tablet 0  . senna (SENOKOT) 8.6 MG TABS tablet Take 1 tablet (8.6 mg total) by mouth daily as needed for mild constipation. 120 each 0  . sertraline (ZOLOFT) 100 MG tablet Take 1 tablet (100 mg total) by mouth daily. 30 tablet 5  . traMADol (ULTRAM) 50 MG tablet Take 50 mg by mouth every 6 (six) hours as needed for moderate pain.     No current facility-administered medications for this visit.       ALLERGIES: Dilaudid [hydromorphone hcl]; Hydrocodone; and Latex  Family History  Problem Relation Age of Onset  . Heart disease Mother   . Stroke Mother   . Hypertension Mother   . Heart disease Father   . Colon cancer Father   . Hypertension Father   . Diabetes Father   . Cancer Paternal Grandmother   . Asthma Daughter   . Seizures Daughter   . Hypertension Sister   . Thyroid disease Sister   . Mental illness Daughter 18       suicide attempt 06/2013  . Asthma Sister   . Hypertension Sister   . Breast cancer Paternal Aunt   . Cancer Paternal Aunt     Social History   Socioeconomic History  . Marital status: Single    Spouse name: n/a  . Number of children: 3  . Years of education: Assoc  . Highest education level: Associate degree: academic program  Occupational History  . Occupation: disabled    Comment: childcare  Social Needs  . Financial resource strain: Hard  . Food insecurity:    Worry: Not on file    Inability: Not on file  . Transportation needs:    Medical: No    Non-medical: Yes  Tobacco Use  . Smoking status: Never Smoker  . Smokeless tobacco: Never Used  Substance and Sexual Activity  . Alcohol use: Yes    Alcohol/week: 0.0 standard drinks    Frequency: Never    Comment: Occasional glass of wine  . Drug use: No  . Sexual activity: Yes    Partners: Male    Birth control/protection: Surgical    Comment: Hyst  Lifestyle  . Physical activity:    Days per week: Not on file    Minutes per session: Not on file  . Stress: Very much  Relationships  . Social connections:    Talks on phone: Not on file    Gets together: Not  on file    Attends religious service: Not on file    Active member of club or organization: Not on file    Attends meetings of clubs or organizations: Not on file    Relationship status: Not on file  . Intimate partner violence:    Fear of current or ex partner: Not on file    Emotionally abused: Not on file     Physically abused: Not on file    Forced sexual activity: Not on file  Other Topics Concern  . Not on file  Social History Narrative   Lives with 2 of her daughters and grandson.   Another daughter is married and lives nearby, both she and her husband are disabled and rely on support from the patient.   Her son lives with his father, who moved out 04/2015, nearby.   Caffeine intake varies     Review of Systems  Constitutional: Negative.   HENT: Negative.   Eyes: Negative.   Respiratory: Negative.   Cardiovascular: Negative.   Gastrointestinal: Negative.   Endocrine: Negative.   Genitourinary:       Vaginal bleeding & cramping  Musculoskeletal: Negative.   Skin: Negative.   Allergic/Immunologic: Negative.   Neurological: Negative.   Hematological: Negative.   Psychiatric/Behavioral: Negative.     PHYSICAL EXAMINATION:    BP 112/72   Pulse 70   Resp 16   Wt 263 lb (119.3 kg)   LMP 05/08/2016 (Exact Date)   BMI 46.59 kg/m     General appearance: alert, cooperative and appears stated age   Pelvic: External genitalia:  no lesions              Urethra:  normal appearing urethra with no masses, tenderness or lesions              Bartholins and Skenes: normal                 Vagina: normal appearing vagina with normal color and discharge, no lesions              Cervix: absent.                Bimanual Exam:  Uterus:   Absent.              Adnexa: no mass, fullness, tenderness              Rectal exam: Yes.  .  Confirms.  Firm stool in the rectal vault.               Anus:  normal sphincter tone, no lesions  Chaperone was present for exam.  ASSESSMENT  Vaginal bleeding? status post hysterectomy.  No lesions seen.   Decreased desire.  Vaginal atrophy.  Menopausal symptoms. Constipation. Rectal bleeding if straining with BM. RA, osteoarthritis. On Embrel. Fibromyalgia.   PLAN  Rx Premarin cream.  Instructed in use.  Discussed potential effect on breast cancer.   Vaginal lubricants reviewed.  I discussed time with her partner.  Urine dip - negative.  Constipation discussed.  Increase fiber and water.  Try a stool softener.  She will see her PCP if bleeding with constipation issues persist.  Fu for annual exam and prn.   An After Visit Summary was printed and given to the patient.  __25___ minutes face to face time of which over 50% was spent in counseling.

## 2019-01-02 DIAGNOSIS — I10 Essential (primary) hypertension: Secondary | ICD-10-CM | POA: Diagnosis not present

## 2019-01-02 DIAGNOSIS — E7800 Pure hypercholesterolemia, unspecified: Secondary | ICD-10-CM | POA: Diagnosis not present

## 2019-01-02 DIAGNOSIS — Z7984 Long term (current) use of oral hypoglycemic drugs: Secondary | ICD-10-CM | POA: Diagnosis not present

## 2019-01-02 DIAGNOSIS — E119 Type 2 diabetes mellitus without complications: Secondary | ICD-10-CM | POA: Diagnosis not present

## 2019-01-02 DIAGNOSIS — E890 Postprocedural hypothyroidism: Secondary | ICD-10-CM | POA: Diagnosis not present

## 2019-04-08 ENCOUNTER — Ambulatory Visit: Payer: BLUE CROSS/BLUE SHIELD | Admitting: Obstetrics and Gynecology

## 2019-09-18 DIAGNOSIS — M51369 Other intervertebral disc degeneration, lumbar region without mention of lumbar back pain or lower extremity pain: Secondary | ICD-10-CM

## 2019-09-18 DIAGNOSIS — M5136 Other intervertebral disc degeneration, lumbar region: Secondary | ICD-10-CM

## 2019-09-18 DIAGNOSIS — M5126 Other intervertebral disc displacement, lumbar region: Secondary | ICD-10-CM

## 2019-09-18 HISTORY — DX: Other intervertebral disc displacement, lumbar region: M51.26

## 2019-09-18 HISTORY — DX: Other intervertebral disc degeneration, lumbar region: M51.36

## 2019-09-18 HISTORY — DX: Other intervertebral disc degeneration, lumbar region without mention of lumbar back pain or lower extremity pain: M51.369

## 2019-11-16 ENCOUNTER — Other Ambulatory Visit: Payer: Self-pay

## 2019-11-16 NOTE — Progress Notes (Signed)
54 y.o. [id number] Single African American female here for annual exam.    Lost 5 pounds.   She had rectal bleeding twice in January.  She had an appointment to see a new PCP in [place].   Has a herniated disc and arthritis.   Infrequent intercourse.  Some discomfort with intercourse.  Has vaginal estrogen and not using it regularly.   She is asking for STD screening.   Working at day care again.   PCP: Levie Heritage, MD   Patient's last menstrual period was 05/08/2016 (exact date).           Sexually active: Yes.   sometimes The current method of family planning is status post hysterectomy.    Exercising: No.  The patient does not participate in regular exercise at present. Smoker:  no  Health Maintenance: Pap:  03/12/16 Neg, Neg HR HPV History of abnormal Pap:  Yes, Hx cryotherapy years ago MMG: 09-02-18 Neg/density B/Birads1--she knows needs to schedule Colonoscopy: 01/04/16 Normal;next 10 years(Has FH of colon cancer) BMD:   n/a  Result  n/a TDaP:  09-14-13 Gardasil:   no HIV: Neg in the past Hep C:Neg in the past Screening Labs:  PCP.   reports that she has never smoked. She has never used smokeless tobacco. She reports previous alcohol use. She reports that she does not use drugs.  Past Medical History:  Diagnosis Date  . Abnormal Pap smear of cervix 1990   Hx cryotherapy to cervix  . Abnormal uterine bleeding   . Anemia 02/20/16   Transfusion of 1 unit pRBCs  . Anxiety   . Anxiety and depression   . Arthritis   . Asthma   . Bulging lumbar disc 2021  . 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 Chest pain    per pt due to anemia-   . Diabetes mellitus without complication ([place])   . Fibroid   . Fibromyalgia   . Generalized pain   . GERD (gastroesophageal reflux disease)   . Gout    in big toe  . Headache(784.0)   . History of blood transfusion 02/20/2016  . Hyperlipidemia   . Hypertension    per Dr. Irven Shelling note 08/30/2011  . Hypothyroidism   .  Iron deficiency anemia 02/15/2016  . Leg swelling    bilateral  . Mild scoliosis 08/06/2017  . Neuromuscular disorder ([place])   . Osteoarthritis    in knees  . Rheumatoid arthritis ([place])   . Scoliosis    mild  . Shortness of breath dyspnea    on exertion due to anemia  . Sleep apnea    cpap machine  . Thyroid disease    Hx 3 goiters--hx thyroidectomy--sees Dr. Forde Dandy    Past Surgical History:  Procedure Laterality Date  . CARDIAC CATHETERIZATION  2013  . CESAREAN SECTION  1995  . CESAREAN SECTION  1996  . COLONOSCOPY    . CYSTOSCOPY N/A 06/11/2016   Procedure: CYSTOSCOPY;  Surgeon: [name], MD;  Location: [facility];  Service: Gynecology;  Laterality: N/A;  . HYSTEROSCOPY WITH D & C N/A 04/10/2016   Procedure: HYSTEROSCOPY WITH fractional dilatation and curretage;  Surgeon: [name], MD;  Location: [facility];  Service: Gynecology;  Laterality: N/A;  . LEFT HEART CATHETERIZATION WITH CORONARY ANGIOGRAM  07/24/2011   Procedure: LEFT HEART CATHETERIZATION WITH CORONARY ANGIOGRAM;  Surgeon: Laverda Page, MD;  Location: [facility];  Service: Cardiovascular;;  . ROBOTIC ASSISTED TOTAL  HYSTERECTOMY WITH SALPINGECTOMY Bilateral 06/11/2016   Procedure: ROBOTIC ASSISTED TOTAL HYSTERECTOMY WITH SALPINGECTOMY;  Surgeon: [name], MD;  Location: [facility];  Service: Gynecology;  Laterality: Bilateral;  . THYROIDECTOMY N/A 12/18/2012   Procedure: TOTAL THYROIDECTOMY;  Surgeon: Earnstine Regal, MD;  Location: [facility];  Service: General;  Laterality: N/A;  . UPPER GI ENDOSCOPY      Current Outpatient Medications  Medication Sig Dispense Refill  . albuterol (PROVENTIL HFA;VENTOLIN HFA) 108 (90 Base) MCG/ACT inhaler Inhale 2 puffs into the lungs every 4 (four) hours as needed for wheezing or shortness of breath (cough, chest tightness). (Patient taking differently: Inhale 2 puffs into the lungs every 4 (four) hours as needed for wheezing or shortness of breath  (cough, chest tightness). ProAir) 18 g 1  . ALPRAZolam (XANAX) 0.5 MG tablet TAKE 1 TABLET(0.5 MG) BY MOUTH AT BEDTIME AS NEEDED FOR SLEEP (Patient taking differently: Take 0.5 mg by mouth 2 (two) times daily as needed for anxiety. ) 30 tablet 0  . atorvastatin (LIPITOR) 20 MG tablet TAKE 1 TABLET(20 MG) BY MOUTH DAILY (Patient taking differently: Take 20 mg by mouth at bedtime. ) 90 tablet 3  . Canagliflozin-metFORMIN HCl (INVOKAMET) 150-500 MG TABS Take 1 tablet by mouth daily. 30 tablet 0  . celecoxib (CELEBREX) 200 MG capsule Take 1 capsule (200 mg total) by mouth daily. 60 capsule 2  . chlorthalidone (HYGROTON) 25 MG tablet TAKE 1 TABLET(25 MG) BY MOUTH DAILY (Patient taking differently: Take 25 mg by mouth daily. ) 90 tablet 3  . conjugated estrogens (PREMARIN) vaginal cream Use 1/2 g vaginally every night at bed time for the first 2 weeks, then use 1/2 g vaginally two times per week. 30 g 1  . cyclobenzaprine (FLEXERIL) 10 MG tablet Take 1 tablet (10 mg total) by mouth 3 (three) times daily as needed for muscle spasms. 30 tablet 0  . diazepam (VALIUM) 5 MG tablet TAKE 1 TABLET(5 MG) BY MOUTH DAILY AS NEEDED FOR MUSCLE SPASMS (Patient taking differently: Take 5 mg by mouth 2 (two) times daily as needed for muscle spasms. ) 30 tablet 0  . docusate sodium (COLACE) 250 MG capsule Take 1 capsule (250 mg total) by mouth daily. 30 capsule 0  . DULoxetine (CYMBALTA) 60 MG capsule Take by mouth.    . etanercept (ENBREL) 50 MG/ML injection Inject 50 mg into the skin every Sunday.    . fluticasone (FLONASE) 50 MCG/ACT nasal spray 2 sprays by Each Nare route daily.    Marland Kitchen gabapentin (NEURONTIN) 300 MG capsule Take 1 capsule by mouth 2 (two) times daily.    Marland Kitchen ibuprofen (ADVIL,MOTRIN) 200 MG tablet Take 400 mg by mouth every 6 (six) hours as needed for headache (pain).    Marland Kitchen levothyroxine (SYNTHROID, LEVOTHROID) 300 MCG tablet Take 1 tablet (300 mcg total) by mouth daily. (Patient taking differently: Take 300  mcg by mouth daily before breakfast. ) 90 tablet 3  . lisinopril (PRINIVIL,ZESTRIL) 5 MG tablet Take 1 tablet (5 mg total) by mouth daily. 90 tablet 3  . meclizine (ANTIVERT) 25 MG tablet Take by mouth.    . nystatin (MYCOSTATIN) powder Apply topically 4 (four) times daily. (Patient taking differently: Apply topically 4 (four) times daily as needed (heat rash). ) 60 g 2  . polyethylene glycol (MIRALAX / GLYCOLAX) packet Take 17 g by mouth 2 (two) times daily. Take 2 packets today (day one), wait 2 hours, if no stool produced then take 2 more  packets on day one; then continue taking 2 packets daily until you achieve daily soft stools; if water stool develops then cut back to 1 packet daily. (Patient taking differently: Take 17 g by mouth daily as needed for mild constipation. ) 14 each 0  . potassium chloride SA (K-DUR,KLOR-CON) 20 MEQ tablet Take 1 tablet (20 mEq total) by mouth 2 (two) times daily. (Patient taking differently: Take 20 mEq by mouth as needed. ) 60 tablet 0  . senna (SENOKOT) 8.6 MG TABS tablet Take 1 tablet (8.6 mg total) by mouth daily as needed for mild constipation. 120 each 0  . traMADol (ULTRAM) 50 MG tablet Take 50 mg by mouth every 6 (six) hours as needed for moderate pain.     No current facility-administered medications for this visit.    Family History  Problem Relation Age of Onset  . Heart disease Mother   . Stroke Mother   . Hypertension Mother   . Heart disease Father   . Colon cancer Father   . Hypertension Father   . Diabetes Father   . Cancer Paternal Grandmother   . Asthma Daughter   . Seizures Daughter   . Hypertension Sister   . Thyroid disease Sister   . Mental illness Daughter 61       suicide attempt 06/2013  . Asthma Sister   . Hypertension Sister   . Breast cancer Paternal Aunt   . Cancer Paternal Aunt     Review of Systems  Gastrointestinal: Positive for anal bleeding.  All other systems reviewed and are negative.   Exam:   BP (!)  144/80 (Cuff Size: Large)   Pulse 76   Temp (!) 97 F (36.1 C) (Temporal)   Resp 16   Ht 5\' 4"  (1.626 m)   Wt 274 lb 6.4 oz (124.5 kg)   LMP 05/08/2016 (Exact Date)   BMI 47.10 kg/m     General appearance: alert, cooperative and appears stated age Head: normocephalic, without obvious abnormality, atraumatic Neck: no adenopathy, supple, symmetrical, trachea midline and thyroid normal to inspection and palpation Lungs: clear to auscultation bilaterally Breasts: normal appearance, no masses or tenderness, No nipple retraction or dimpling, No nipple discharge or bleeding, No axillary adenopathy Heart: regular rate and rhythm Abdomen: soft, non-tender; no masses, no organomegaly Extremities: extremities normal, atraumatic, no cyanosis or edema Skin: skin color, texture, turgor normal. No rashes or lesions Lymph nodes: cervical, supraclavicular, and axillary nodes normal. Neurologic: grossly normal  Pelvic: External genitalia:  no lesions              No abnormal inguinal nodes palpated.              Urethra:  normal appearing urethra with no masses, tenderness or lesions              Bartholins and Skenes: normal                 Vagina: normal appearing vagina with normal color and discharge, no lesions              Cervix: absent.              Pap taken: No. Bimanual Exam:  Uterus: absent              Adnexa: no mass, fullness, tenderness              Rectal exam: Yes.  .  Confirms.  Anus:  normal sphincter tone, no lesions  Chaperone was present for exam.  Assessment:   Well woman visit with normal exam. Status post robotic hysterectomy with bilateral salpingectomy. Ovaries remain. Rectal bleeding. FH colon cancer.  RA, osteoarthritis. On Embrel. Fibromyalgia.   Plan: Mammogram screening discussed.  She will schedule.  Self breast awareness reviewed. Pap and HR HPV as above. Guidelines for Calcium, Vitamin D, regular exercise program including cardiovascular  and weight bearing exercise. We reviewed healthy diet.  She will see her PCP regarding her rectal bleeding.  We discussed cooking oils for lubrication for intercourse.  STD screening.  Routine labs with PCP.  Follow up annually and prn.   After visit summary provided.

## 2019-11-17 ENCOUNTER — Encounter: Payer: Self-pay | Admitting: Obstetrics and Gynecology

## 2019-11-17 ENCOUNTER — Ambulatory Visit (INDEPENDENT_AMBULATORY_CARE_PROVIDER_SITE_OTHER): Payer: Self-pay | Admitting: Obstetrics and Gynecology

## 2019-11-17 ENCOUNTER — Other Ambulatory Visit (HOSPITAL_COMMUNITY)
Admission: RE | Admit: 2019-11-17 | Payer: BLUE CROSS/BLUE SHIELD | Source: Ambulatory Visit | Admitting: Obstetrics and Gynecology

## 2019-11-17 VITALS — BP 144/80 | HR 76 | Temp 97.0°F | Resp 16 | Ht 64.0 in | Wt 274.4 lb

## 2019-11-17 DIAGNOSIS — Z113 Encounter for screening for infections with a predominantly sexual mode of transmission: Secondary | ICD-10-CM

## 2019-11-17 DIAGNOSIS — Z01419 Encounter for gynecological examination (general) (routine) without abnormal findings: Secondary | ICD-10-CM

## 2019-11-17 NOTE — Patient Instructions (Signed)

## 2019-11-18 LAB — CERVICOVAGINAL ANCILLARY ONLY
Chlamydia: NEGATIVE
Comment: NEGATIVE
Comment: NEGATIVE
Comment: NORMAL
Neisseria Gonorrhea: NEGATIVE
Trichomonas: NEGATIVE

## 2019-11-18 LAB — HEP, RPR, HIV PANEL
HIV Screen 4th Generation wRfx: NONREACTIVE
Hepatitis B Surface Ag: NEGATIVE
RPR Ser Ql: NONREACTIVE

## 2019-11-18 LAB — HEPATITIS C ANTIBODY: Hep C Virus Ab: <0.1 s/co ratio (ref 0.0–0.9)

## 2020-05-14 ENCOUNTER — Inpatient Hospital Stay (HOSPITAL_COMMUNITY)
Admission: EM | Admit: 2020-05-14 | Discharge: 2020-05-17 | DRG: 177 | Disposition: A | Discharge status: Home / Self Care | Payer: BLUE CROSS/BLUE SHIELD | Attending: Internal Medicine | Admitting: Internal Medicine

## 2020-05-14 ENCOUNTER — Other Ambulatory Visit: Payer: Self-pay

## 2020-05-14 ENCOUNTER — Emergency Department (HOSPITAL_COMMUNITY): Payer: BLUE CROSS/BLUE SHIELD

## 2020-05-14 ENCOUNTER — Inpatient Hospital Stay (HOSPITAL_COMMUNITY): Payer: BLUE CROSS/BLUE SHIELD

## 2020-05-14 DIAGNOSIS — Z833 Family history of diabetes mellitus: Secondary | ICD-10-CM | POA: Diagnosis not present

## 2020-05-14 DIAGNOSIS — R590 Localized enlarged lymph nodes: Secondary | ICD-10-CM

## 2020-05-14 DIAGNOSIS — E890 Postprocedural hypothyroidism: Secondary | ICD-10-CM | POA: Diagnosis present

## 2020-05-14 DIAGNOSIS — J1282 Pneumonia due to coronavirus disease 2019: Secondary | ICD-10-CM | POA: Diagnosis present

## 2020-05-14 DIAGNOSIS — U071 COVID-19: Principal | ICD-10-CM | POA: Diagnosis present

## 2020-05-14 DIAGNOSIS — E785 Hyperlipidemia, unspecified: Secondary | ICD-10-CM | POA: Diagnosis present

## 2020-05-14 DIAGNOSIS — K5900 Constipation, unspecified: Secondary | ICD-10-CM | POA: Diagnosis present

## 2020-05-14 DIAGNOSIS — Z825 Family history of asthma and other chronic lower respiratory diseases: Secondary | ICD-10-CM

## 2020-05-14 DIAGNOSIS — K861 Other chronic pancreatitis: Secondary | ICD-10-CM | POA: Diagnosis present

## 2020-05-14 DIAGNOSIS — I10 Essential (primary) hypertension: Secondary | ICD-10-CM | POA: Diagnosis present

## 2020-05-14 DIAGNOSIS — Z6841 Body Mass Index (BMI) 40.0 and over, adult: Secondary | ICD-10-CM | POA: Diagnosis not present

## 2020-05-14 DIAGNOSIS — G4733 Obstructive sleep apnea (adult) (pediatric): Secondary | ICD-10-CM | POA: Diagnosis present

## 2020-05-14 DIAGNOSIS — Z8249 Family history of ischemic heart disease and other diseases of the circulatory system: Secondary | ICD-10-CM

## 2020-05-14 DIAGNOSIS — M797 Fibromyalgia: Secondary | ICD-10-CM | POA: Diagnosis present

## 2020-05-14 DIAGNOSIS — J45909 Unspecified asthma, uncomplicated: Secondary | ICD-10-CM | POA: Diagnosis present

## 2020-05-14 DIAGNOSIS — Z8349 Family history of other endocrine, nutritional and metabolic diseases: Secondary | ICD-10-CM

## 2020-05-14 DIAGNOSIS — Z79899 Other long term (current) drug therapy: Secondary | ICD-10-CM

## 2020-05-14 DIAGNOSIS — K219 Gastro-esophageal reflux disease without esophagitis: Secondary | ICD-10-CM | POA: Diagnosis present

## 2020-05-14 DIAGNOSIS — M419 Scoliosis, unspecified: Secondary | ICD-10-CM | POA: Diagnosis present

## 2020-05-14 DIAGNOSIS — E876 Hypokalemia: Secondary | ICD-10-CM | POA: Diagnosis present

## 2020-05-14 DIAGNOSIS — Z9114 Patient's other noncompliance with medication regimen: Secondary | ICD-10-CM

## 2020-05-14 DIAGNOSIS — Z91040 Latex allergy status: Secondary | ICD-10-CM

## 2020-05-14 DIAGNOSIS — Z79890 Hormone replacement therapy (postmenopausal): Secondary | ICD-10-CM

## 2020-05-14 DIAGNOSIS — F419 Anxiety disorder, unspecified: Secondary | ICD-10-CM | POA: Diagnosis present

## 2020-05-14 DIAGNOSIS — J9601 Acute respiratory failure with hypoxia: Secondary | ICD-10-CM | POA: Diagnosis present

## 2020-05-14 DIAGNOSIS — E119 Type 2 diabetes mellitus without complications: Secondary | ICD-10-CM | POA: Diagnosis present

## 2020-05-14 DIAGNOSIS — M069 Rheumatoid arthritis, unspecified: Secondary | ICD-10-CM | POA: Diagnosis present

## 2020-05-14 DIAGNOSIS — Z888 Allergy status to other drugs, medicaments and biological substances status: Secondary | ICD-10-CM

## 2020-05-14 DIAGNOSIS — Z823 Family history of stroke: Secondary | ICD-10-CM | POA: Diagnosis not present

## 2020-05-14 DIAGNOSIS — E559 Vitamin D deficiency, unspecified: Secondary | ICD-10-CM | POA: Diagnosis present

## 2020-05-14 DIAGNOSIS — F329 Major depressive disorder, single episode, unspecified: Secondary | ICD-10-CM | POA: Diagnosis present

## 2020-05-14 DIAGNOSIS — D509 Iron deficiency anemia, unspecified: Secondary | ICD-10-CM | POA: Diagnosis present

## 2020-05-14 LAB — CBC WITH DIFFERENTIAL/PLATELET
Abs Immature Granulocytes: 0.04 10*3/uL (ref 0.00–0.07)
Basophils Absolute: 0 10*3/uL (ref 0.0–0.1)
Basophils Relative: 1 %
Eosinophils Absolute: 0 10*3/uL (ref 0.0–0.5)
Eosinophils Relative: 0 %
HCT: 40 % (ref 36.0–46.0)
Hemoglobin: 12.8 g/dL (ref 12.0–15.0)
Immature Granulocytes: 1 %
Lymphocytes Relative: 53 %
Lymphs Abs: 3 10*3/uL (ref 0.7–4.0)
MCH: 31.1 pg (ref 26.0–34.0)
MCHC: 32 g/dL (ref 30.0–36.0)
MCV: 97.1 fL (ref 80.0–100.0)
Monocytes Absolute: 0.3 10*3/uL (ref 0.1–1.0)
Monocytes Relative: 6 %
Neutro Abs: 2.2 10*3/uL (ref 1.7–7.7)
Neutrophils Relative %: 39 %
Platelets: 270 10*3/uL (ref 150–400)
RBC: 4.12 MIL/uL (ref 3.87–5.11)
RDW: 14.7 % (ref 11.5–15.5)
WBC: 5.5 10*3/uL (ref 4.0–10.5)
nRBC: 0 % (ref 0.0–0.2)

## 2020-05-14 LAB — CBC
HCT: 39.6 % (ref 36.0–46.0)
Hemoglobin: 12.6 g/dL (ref 12.0–15.0)
MCH: 31.1 pg (ref 26.0–34.0)
MCHC: 31.8 g/dL (ref 30.0–36.0)
MCV: 97.8 fL (ref 80.0–100.0)
Platelets: 274 10*3/uL (ref 150–400)
RBC: 4.05 MIL/uL (ref 3.87–5.11)
RDW: 14.7 % (ref 11.5–15.5)
WBC: 5.4 10*3/uL (ref 4.0–10.5)
nRBC: 0 % (ref 0.0–0.2)

## 2020-05-14 LAB — COMPREHENSIVE METABOLIC PANEL
ALT: 17 U/L (ref 0–44)
AST: 25 U/L (ref 15–41)
Albumin: 3.3 g/dL — ABNORMAL LOW (ref 3.5–5.0)
Alkaline Phosphatase: 48 U/L (ref 38–126)
Anion gap: 12 (ref 5–15)
BUN: 10 mg/dL (ref 6–20)
CO2: 28 mmol/L (ref 22–32)
Calcium: 7.1 mg/dL — ABNORMAL LOW (ref 8.9–10.3)
Chloride: 99 mmol/L (ref 98–111)
Creatinine, Ser: 0.83 mg/dL (ref 0.44–1.00)
GFR calc Af Amer: >60 mL/min (ref >60)
GFR calc non Af Amer: >60 mL/min (ref >60)
Glucose, Bld: 127 mg/dL — ABNORMAL HIGH (ref 70–99)
Potassium: 2.8 mmol/L — ABNORMAL LOW (ref 3.5–5.1)
Sodium: 139 mmol/L (ref 135–145)
Total Bilirubin: 0.7 mg/dL (ref 0.3–1.2)
Total Protein: 7.9 g/dL (ref 6.5–8.1)

## 2020-05-14 LAB — CREATININE, SERUM
Creatinine, Ser: 0.84 mg/dL (ref 0.44–1.00)
GFR calc Af Amer: >60 mL/min (ref >60)
GFR calc non Af Amer: >60 mL/min (ref >60)

## 2020-05-14 LAB — LACTIC ACID, PLASMA
Lactic Acid, Venous: <0.3 mmol/L — ABNORMAL LOW (ref 0.5–1.9)
Lactic Acid, Venous: 1.1 mmol/L (ref 0.5–1.9)

## 2020-05-14 LAB — PHOSPHORUS: Phosphorus: 2.7 mg/dL (ref 2.5–4.6)

## 2020-05-14 LAB — CBG MONITORING, ED: Glucose-Capillary: 128 mg/dL — ABNORMAL HIGH (ref 70–99)

## 2020-05-14 LAB — LACTATE DEHYDROGENASE: LDH: 261 U/L — ABNORMAL HIGH (ref 98–192)

## 2020-05-14 LAB — D-DIMER, QUANTITATIVE: D-Dimer, Quant: 0.93 ug/mL-FEU — ABNORMAL HIGH (ref 0.00–0.50)

## 2020-05-14 LAB — PROCALCITONIN: Procalcitonin: <0.1 ng/mL

## 2020-05-14 LAB — MAGNESIUM: Magnesium: 2 mg/dL (ref 1.7–2.4)

## 2020-05-14 LAB — TSH: TSH: 27.12 u[IU]/mL — ABNORMAL HIGH (ref 0.350–4.500)

## 2020-05-14 LAB — TRIGLYCERIDES: Triglycerides: 109 mg/dL (ref <150)

## 2020-05-14 LAB — FIBRINOGEN: Fibrinogen: 580 mg/dL — ABNORMAL HIGH (ref 210–475)

## 2020-05-14 MED ORDER — SODIUM CHLORIDE 0.9 % IV SOLN
100.0000 mg | Freq: Every day | INTRAVENOUS | Status: DC
  Administered 2020-05-15 – 2020-05-17 (×3): 100 mg via INTRAVENOUS
  Filled 2020-05-14 (×3): qty 20

## 2020-05-14 MED ORDER — ONDANSETRON HCL 4 MG PO TABS: 4.0000 mg | ORAL_TABLET | Freq: Four times a day (QID) | ORAL | Status: DC | PRN

## 2020-05-14 MED ORDER — ALBUTEROL SULFATE HFA 108 (90 BASE) MCG/ACT IN AERS: 2.0000 | INHALATION_SPRAY | RESPIRATORY_TRACT | Status: DC | PRN

## 2020-05-14 MED ORDER — FENTANYL CITRATE (PF) 100 MCG/2ML IJ SOLN
100.0000 ug | Freq: Once | INTRAMUSCULAR | Status: AC
  Administered 2020-05-14: 100 ug via INTRAVENOUS
  Filled 2020-05-14: qty 2

## 2020-05-14 MED ORDER — KETOROLAC TROMETHAMINE 15 MG/ML IJ SOLN
15.0000 mg | Freq: Once | INTRAMUSCULAR | Status: DC
  Filled 2020-05-14: qty 1

## 2020-05-14 MED ORDER — ATORVASTATIN CALCIUM 20 MG PO TABS
20.0000 mg | ORAL_TABLET | Freq: Every day | ORAL | Status: DC
  Administered 2020-05-15 – 2020-05-17 (×3): 20 mg via ORAL
  Filled 2020-05-14: qty 1
  Filled 2020-05-14: qty 2
  Filled 2020-05-14: qty 1

## 2020-05-14 MED ORDER — LISINOPRIL 5 MG PO TABS
5.0000 mg | ORAL_TABLET | Freq: Every day | ORAL | Status: DC
  Administered 2020-05-15 – 2020-05-17 (×3): 5 mg via ORAL
  Filled 2020-05-14 (×3): qty 1

## 2020-05-14 MED ORDER — VITAMIN D 25 MCG (1000 UNIT) PO TABS: 50000.0000 [IU] | ORAL_TABLET | Freq: Once | ORAL | Status: DC

## 2020-05-14 MED ORDER — DULOXETINE HCL 60 MG PO CPEP
60.0000 mg | ORAL_CAPSULE | Freq: Every day | ORAL | Status: DC
  Administered 2020-05-15 – 2020-05-17 (×3): 60 mg via ORAL
  Filled 2020-05-14 (×2): qty 1
  Filled 2020-05-14: qty 2

## 2020-05-14 MED ORDER — SODIUM CHLORIDE 0.9% FLUSH
3.0000 mL | Freq: Two times a day (BID) | INTRAVENOUS | Status: DC
  Administered 2020-05-15 – 2020-05-17 (×5): 3 mL via INTRAVENOUS

## 2020-05-14 MED ORDER — DEXAMETHASONE SODIUM PHOSPHATE 10 MG/ML IJ SOLN
10.0000 mg | Freq: Once | INTRAMUSCULAR | Status: AC
  Administered 2020-05-14: 10 mg via INTRAVENOUS
  Filled 2020-05-14: qty 1

## 2020-05-14 MED ORDER — SODIUM CHLORIDE 0.9 % IV SOLN
200.0000 mg | Freq: Once | INTRAVENOUS | Status: AC
  Administered 2020-05-14: 200 mg via INTRAVENOUS
  Filled 2020-05-14: qty 200

## 2020-05-14 MED ORDER — ONDANSETRON HCL 4 MG/2ML IJ SOLN
4.0000 mg | Freq: Four times a day (QID) | INTRAMUSCULAR | Status: DC | PRN
  Administered 2020-05-15: 4 mg via INTRAVENOUS
  Filled 2020-05-14: qty 2

## 2020-05-14 MED ORDER — POTASSIUM CHLORIDE CRYS ER 20 MEQ PO TBCR
20.0000 meq | EXTENDED_RELEASE_TABLET | Freq: Two times a day (BID) | ORAL | Status: DC
  Administered 2020-05-14 – 2020-05-17 (×6): 20 meq via ORAL
  Filled 2020-05-14 (×6): qty 1

## 2020-05-14 MED ORDER — SODIUM CHLORIDE 0.9 % IV BOLUS
1000.0000 mL | Freq: Once | INTRAVENOUS | Status: AC
  Administered 2020-05-14: 1000 mL via INTRAVENOUS

## 2020-05-14 MED ORDER — ASCORBIC ACID 500 MG PO TABS
1000.0000 mg | ORAL_TABLET | Freq: Three times a day (TID) | ORAL | Status: DC
  Administered 2020-05-14 – 2020-05-17 (×8): 1000 mg via ORAL
  Filled 2020-05-14 (×8): qty 2

## 2020-05-14 MED ORDER — ACETAMINOPHEN 325 MG PO TABS
650.0000 mg | ORAL_TABLET | Freq: Four times a day (QID) | ORAL | Status: DC | PRN
  Administered 2020-05-15: 650 mg via ORAL
  Filled 2020-05-14: qty 2

## 2020-05-14 MED ORDER — CHLORTHALIDONE 25 MG PO TABS
25.0000 mg | ORAL_TABLET | Freq: Every day | ORAL | Status: DC
  Administered 2020-05-15 – 2020-05-17 (×3): 25 mg via ORAL
  Filled 2020-05-14 (×3): qty 1

## 2020-05-14 MED ORDER — GUAIFENESIN-DM 100-10 MG/5ML PO SYRP: 10.0000 mL | ORAL_SOLUTION | ORAL | Status: DC | PRN

## 2020-05-14 MED ORDER — MELATONIN 5 MG PO TABS
5.0000 mg | ORAL_TABLET | Freq: Every day | ORAL | Status: DC
  Administered 2020-05-15 – 2020-05-16 (×3): 5 mg via ORAL
  Filled 2020-05-14 (×3): qty 1

## 2020-05-14 MED ORDER — POTASSIUM CHLORIDE CRYS ER 20 MEQ PO TBCR
40.0000 meq | EXTENDED_RELEASE_TABLET | Freq: Once | ORAL | Status: AC
  Administered 2020-05-14: 40 meq via ORAL
  Filled 2020-05-14: qty 2

## 2020-05-14 MED ORDER — LEVOTHYROXINE SODIUM 100 MCG PO TABS
300.0000 ug | ORAL_TABLET | Freq: Every day | ORAL | Status: DC
  Administered 2020-05-15: 300 ug via ORAL
  Filled 2020-05-14: qty 3

## 2020-05-14 MED ORDER — ZINC SULFATE 220 (50 ZN) MG PO CAPS
220.0000 mg | ORAL_CAPSULE | Freq: Every day | ORAL | Status: DC
  Administered 2020-05-14 – 2020-05-17 (×4): 220 mg via ORAL
  Filled 2020-05-14 (×4): qty 1

## 2020-05-14 MED ORDER — SENNOSIDES-DOCUSATE SODIUM 8.6-50 MG PO TABS: 1.0000 | ORAL_TABLET | Freq: Every evening | ORAL | Status: DC | PRN

## 2020-05-14 MED ORDER — CYCLOBENZAPRINE HCL 10 MG PO TABS: 10.0000 mg | ORAL_TABLET | Freq: Three times a day (TID) | ORAL | Status: DC | PRN

## 2020-05-14 MED ORDER — SODIUM CHLORIDE 0.9 % IV SOLN: 100.0000 mg | Freq: Every day | INTRAVENOUS | Status: DC

## 2020-05-14 MED ORDER — TOPIRAMATE 100 MG PO TABS
100.0000 mg | ORAL_TABLET | Freq: Every day | ORAL | Status: DC
  Administered 2020-05-15 – 2020-05-17 (×3): 100 mg via ORAL
  Filled 2020-05-14 (×3): qty 1

## 2020-05-14 MED ORDER — SODIUM CHLORIDE 0.9% FLUSH: 3.0000 mL | INTRAVENOUS | Status: DC | PRN

## 2020-05-14 MED ORDER — SODIUM CHLORIDE 0.9 % IV SOLN: 250.0000 mL | INTRAVENOUS | Status: DC | PRN

## 2020-05-14 MED ORDER — SODIUM CHLORIDE 0.9 % IV SOLN: 200.0000 mg | Freq: Once | INTRAVENOUS | Status: DC

## 2020-05-14 MED ORDER — ALBUTEROL SULFATE HFA 108 (90 BASE) MCG/ACT IN AERS
2.0000 | INHALATION_SPRAY | Freq: Four times a day (QID) | RESPIRATORY_TRACT | Status: DC
  Administered 2020-05-14 – 2020-05-17 (×11): 2 via RESPIRATORY_TRACT
  Filled 2020-05-14 (×2): qty 6.7

## 2020-05-14 MED ORDER — LINAGLIPTIN 5 MG PO TABS
5.0000 mg | ORAL_TABLET | Freq: Every day | ORAL | Status: DC
  Administered 2020-05-15 – 2020-05-17 (×3): 5 mg via ORAL
  Filled 2020-05-14 (×3): qty 1

## 2020-05-14 MED ORDER — TRAMADOL HCL 50 MG PO TABS
50.0000 mg | ORAL_TABLET | Freq: Four times a day (QID) | ORAL | Status: DC | PRN
  Administered 2020-05-14 – 2020-05-15 (×2): 50 mg via ORAL
  Filled 2020-05-14 (×2): qty 1

## 2020-05-14 MED ORDER — PLECANATIDE 3 MG PO TABS: 3.0000 mg | ORAL_TABLET | Freq: Every day | ORAL | Status: DC

## 2020-05-14 MED ORDER — INSULIN ASPART 100 UNIT/ML ~~LOC~~ SOLN
0.0000 [IU] | Freq: Three times a day (TID) | SUBCUTANEOUS | Status: DC
  Administered 2020-05-15: 4 [IU] via SUBCUTANEOUS
  Administered 2020-05-15 (×2): 7 [IU] via SUBCUTANEOUS
  Administered 2020-05-16: 4 [IU] via SUBCUTANEOUS
  Administered 2020-05-16 (×2): 7 [IU] via SUBCUTANEOUS
  Administered 2020-05-17 (×2): 3 [IU] via SUBCUTANEOUS
  Filled 2020-05-14: qty 0.2

## 2020-05-14 MED ORDER — ENOXAPARIN SODIUM 60 MG/0.6ML ~~LOC~~ SOLN
60.0000 mg | SUBCUTANEOUS | Status: DC
  Administered 2020-05-14: 60 mg via SUBCUTANEOUS
  Filled 2020-05-14: qty 0.6

## 2020-05-14 MED ORDER — DEXAMETHASONE 4 MG PO TABS: 6.0000 mg | ORAL_TABLET | Freq: Every day | ORAL | Status: DC

## 2020-05-14 NOTE — ED Provider Notes (Signed)
[facility] DEPT Provider Note   CSN: [id number] Arrival date & time: 05/14/20  1200     History Chief Complaint  Patient presents with  . covid positive  . Shortness of Breath    Emily Mathis is a 54 y.o. female.  HPI 54 year old female presents with shortness of breath.  She currently has Covid-19 and was diagnosed 8 days ago.  Over the last 2 days she has noticed significant shortness of breath, especially with movement/walking.  Her cough has also worsened and she is coughing so hard that she is developing chest pain when she coughs.  It is in the middle of her chest.  Has had fevers, body aches.  She has not had any vomiting, diarrhea, or abdominal pain.   Past Medical History:  Diagnosis Date  . Abnormal Pap smear of cervix 1990   Hx cryotherapy to cervix  . Abnormal uterine bleeding   . Anemia 02/20/16   Transfusion of 1 unit pRBCs  . Anxiety   . Anxiety and depression   . Arthritis   . Asthma   . Bulging lumbar disc 2021  . 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 Chest pain    per pt due to anemia-   . Diabetes mellitus without complication ([place])   . Fibroid   . Fibromyalgia   . Generalized pain   . GERD (gastroesophageal reflux disease)   . Gout    in big toe  . Headache(784.0)   . History of blood transfusion 02/20/2016  . Hyperlipidemia   . Hypertension    per Dr. Irven Shelling note 08/30/2011  . Hypothyroidism   . Iron deficiency anemia 02/15/2016  . Leg swelling    bilateral  . Mild scoliosis 08/06/2017  . Neuromuscular disorder ([place])   . Osteoarthritis    in knees  . Rheumatoid arthritis ([place])   . Scoliosis    mild  . Shortness of breath dyspnea    on exertion due to anemia  . Sleep apnea    cpap machine  . Thyroid disease    Hx 3 goiters--hx thyroidectomy--sees Dr. Forde Dandy    Patient Active Problem List   Diagnosis Date Noted  . Pneumonia due to COVID-19 virus 05/14/2020  . Hypokalemia 05/14/2020  . Rheumatoid  arthritis ([place]) 05/14/2020  . Acute gouty arthritis 01/01/2018  . Pain of toe of right foot 01/01/2018  . Essential hypertension 01/01/2018  . OSA (obstructive sleep apnea) 11/12/2017  . DJD (degenerative joint disease) of knee 09/13/2017  . DDD (degenerative disc disease), lumbar 09/13/2017  . DDD (degenerative disc disease), cervical 09/13/2017  . Abdominal pain   . Chronic pancreatitis ([place])   . Constipation   . Status post laparoscopic hysterectomy 06/11/2016  . Myalgia 02/15/2016  . Upper airway cough syndrome 10/21/2015  . Fibromyalgia 06/16/2015  . Type 2 diabetes mellitus without complication, without long-term current use of insulin ([place]) 09/14/2013  . Keloid scar, cervical incision 09/02/2013  . Reaction, situational, acute, to stress 07/02/2013  . 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 BMI 45.0-49.9, adult ([place]) 03/27/2013  . Hypocalcemia 01/07/2013  . Chest pain on exertion 07/24/2011    Past Surgical History:  Procedure Laterality Date  . CARDIAC CATHETERIZATION  2013  . CESAREAN SECTION  1995  . CESAREAN SECTION  1996  . COLONOSCOPY    . CYSTOSCOPY N/A 06/11/2016   Procedure: CYSTOSCOPY;  Surgeon: [name], MD;  Location: [facility];  Service: Gynecology;  Laterality: N/A;  .  HYSTEROSCOPY WITH D & C N/A 04/10/2016   Procedure: HYSTEROSCOPY WITH fractional dilatation and curretage;  Surgeon: [name], MD;  Location: [facility];  Service: Gynecology;  Laterality: N/A;  . LEFT HEART CATHETERIZATION WITH CORONARY ANGIOGRAM  07/24/2011   Procedure: LEFT HEART CATHETERIZATION WITH CORONARY ANGIOGRAM;  Surgeon: Laverda Page, MD;  Location: [facility] CATH LAB;  Service: Cardiovascular;;  . ROBOTIC ASSISTED TOTAL HYSTERECTOMY WITH SALPINGECTOMY Bilateral 06/11/2016   Procedure: ROBOTIC ASSISTED TOTAL HYSTERECTOMY WITH SALPINGECTOMY;  Surgeon: [name], MD;  Location: [facility];  Service: Gynecology;  Laterality: Bilateral;  . THYROIDECTOMY  N/A 12/18/2012   Procedure: TOTAL THYROIDECTOMY;  Surgeon: Earnstine Regal, MD;  Location: [facility];  Service: General;  Laterality: N/A;  . UPPER GI ENDOSCOPY       OB History    Gravida  3   Para  3   Term  3   Preterm      AB      Living  3     SAB      TAB      Ectopic      Multiple      Live Births              Family History  Problem Relation Age of Onset  . Heart disease Mother   . Stroke Mother   . Hypertension Mother   . Heart disease Father   . Colon cancer Father   . Hypertension Father   . Diabetes Father   . Cancer Paternal Grandmother   . Asthma Daughter   . Seizures Daughter   . Hypertension Sister   . Thyroid disease Sister   . Mental illness Daughter 67       suicide attempt 06/2013  . Asthma Sister   . Hypertension Sister   . Breast cancer Paternal Aunt   . Cancer Paternal Aunt     Social History   Tobacco Use  . Smoking status: Never Smoker  . Smokeless tobacco: Never Used  Vaping Use  . Vaping Use: Never used  Substance Use Topics  . Alcohol use: Not Currently    Alcohol/week: 0.0 standard drinks  . Drug use: No    Home Medications Prior to Admission medications   Medication Sig Start Date End Date Taking? Authorizing Provider  acetaminophen (TYLENOL) 325 MG tablet Take 650 mg by mouth every 6 (six) hours as needed for mild pain, fever or headache.   Yes [provider]  albuterol (PROVENTIL HFA;VENTOLIN HFA) 108 (90 Base) MCG/ACT inhaler Inhale 2 puffs into the lungs every 4 (four) hours as needed for wheezing or shortness of breath (cough, chest tightness). Patient taking differently: Inhale 2 puffs into the lungs every 4 (four) hours as needed for wheezing or shortness of breath (cough, chest tightness). ProAir 09/06/17  Yes Jeffery, [name], PA  atorvastatin (LIPITOR) 20 MG tablet TAKE 1 TABLET(20 MG) BY MOUTH DAILY Patient taking differently: Take 20 mg by mouth daily.  12/24/17  Yes Jeffery, [name], PA    Canagliflozin-metFORMIN HCl (INVOKAMET) 150-500 MG TABS Take 1 tablet by mouth daily. 02/26/18  Yes Jeffery, Domingo Mend, PA  celecoxib (CELEBREX) 200 MG capsule Take 1 capsule (200 mg total) by mouth daily. 02/26/18  Yes Jeffery, [name], PA  chlorthalidone (HYGROTON) 25 MG tablet TAKE 1 TABLET(25 MG) BY MOUTH DAILY Patient taking differently: Take 25 mg by mouth daily.  12/24/17  Yes Harrison Mons, PA  conjugated estrogens (PREMARIN) vaginal cream  Use 1/2 g vaginally every night at bed time for the first 2 weeks, then use 1/2 g vaginally two times per week. Patient taking differently: Place 1 Applicatorful vaginally as needed (hormone therapy). Use 1/2 g vaginally every night at bed time for the first 2 weeks, then use 1/2 g vaginally two times per week. 11/25/18  Yes [name], MD  cyclobenzaprine (FLEXERIL) 10 MG tablet Take 1 tablet (10 mg total) by mouth 3 (three) times daily as needed for muscle spasms. 11/12/16  Yes Jeffery, [name], PA  DULoxetine (CYMBALTA) 60 MG capsule Take 60 mg by mouth daily.  04/15/19  Yes [provider]  etanercept (ENBREL) 50 MG/ML injection Inject 50 mg into the skin every Sunday.   Yes [provider]  fluticasone (FLONASE) 50 MCG/ACT nasal spray Place 2 sprays into both nostrils daily as needed for allergies or rhinitis.  04/15/19  Yes [provider]  gabapentin (NEURONTIN) 300 MG capsule Take 1 capsule by mouth 2 (two) times daily. 02/16/19  Yes [provider]  ibuprofen (ADVIL,MOTRIN) 200 MG tablet Take 400 mg by mouth every 6 (six) hours as needed for fever, headache or mild pain.    Yes [provider]  levothyroxine (SYNTHROID, LEVOTHROID) 300 MCG tablet Take 1 tablet (300 mcg total) by mouth daily. Patient taking differently: Take 300 mcg by mouth daily before breakfast.  06/17/15  Yes Jeffery, [name], PA  lisinopril (PRINIVIL,ZESTRIL) 5 MG tablet Take 1 tablet (5 mg total) by mouth daily. 12/24/17  Yes Jeffery,  Domingo Mend, PA  Multiple Vitamins-Minerals (ONE-A-DAY WOMENS PO) Take 1 tablet by mouth daily.   Yes [provider]  nystatin (MYCOSTATIN) powder Apply topically 4 (four) times daily. Patient taking differently: Apply 1 application topically 4 (four) times daily as needed (heat rash).  10/02/15  Yes Jeffery, [name], PA  Plecanatide (TRULANCE) 3 MG TABS Take 1 tablet by mouth daily. 04/26/20  Yes [provider]  topiramate (TOPAMAX) 100 MG tablet Take 1 tablet by mouth daily. 02/01/20  Yes [provider]  traMADol (ULTRAM) 50 MG tablet Take 50 mg by mouth every 6 (six) hours as needed for moderate pain.   Yes [provider]  ALPRAZolam (XANAX) 0.5 MG tablet TAKE 1 TABLET(0.5 MG) BY MOUTH AT BEDTIME AS NEEDED FOR SLEEP Patient not taking: Reported on 05/14/2020 02/26/18   Harrison Mons, PA  diazepam (VALIUM) 5 MG tablet TAKE 1 TABLET(5 MG) BY MOUTH DAILY AS NEEDED FOR MUSCLE SPASMS Patient not taking: Reported on 05/14/2020 02/26/18   Harrison Mons, PA  docusate sodium (COLACE) 250 MG capsule Take 1 capsule (250 mg total) by mouth daily. Patient not taking: Reported on 05/14/2020 09/06/16   Harrison Mons, PA  polyethylene glycol (MIRALAX / GLYCOLAX) packet Take 17 g by mouth 2 (two) times daily. Take 2 packets today (day one), wait 2 hours, if no stool produced then take 2 more packets on day one; then continue taking 2 packets daily until you achieve daily soft stools; if water stool develops then cut back to 1 packet daily. Patient not taking: Reported on 05/14/2020 07/24/16   Street, [place], PA-C  potassium chloride SA (K-DUR,KLOR-CON) 20 MEQ tablet Take 1 tablet (20 mEq total) by mouth 2 (two) times daily. Patient not taking: Reported on 05/14/2020 06/12/16   [name], MD  senna (SENOKOT) 8.6 MG TABS tablet Take 1 tablet (8.6 mg total) by mouth daily as needed for mild constipation. Patient not taking: Reported  on 05/14/2020 08/22/16   Tonette Bihari, MD    Allergies    Dilaudid [hydromorphone hcl], Hydrocodone, and Latex  Review of Systems   Review of Systems  Constitutional: Positive for fever.  Respiratory: Positive for cough and shortness of breath.   Cardiovascular: Positive for chest pain.  Gastrointestinal: Negative for abdominal pain, diarrhea and vomiting.  Musculoskeletal: Positive for myalgias.  All other systems reviewed and are negative.   Physical Exam Updated Vital Signs BP (!) 162/82   Pulse 95   Temp 98.4 F (36.9 C) (Oral)   Resp 18   Ht 5\' 3"  (1.6 m)   Wt 122.9 kg   LMP 05/08/2016 (Exact Date)   SpO2 93%   BMI 48.01 kg/m   Physical Exam Vitals and nursing note reviewed.  Constitutional:      General: She is not in acute distress.    Appearance: She is well-developed. She is obese. She is not ill-appearing or diaphoretic.  HENT:     Head: Normocephalic and atraumatic.     Right Ear: External ear normal.     Left Ear: External ear normal.     Nose: Nose normal.  Eyes:     General:        Right eye: No discharge.        Left eye: No discharge.  Cardiovascular:     Rate and Rhythm: Normal rate and regular rhythm.     Heart sounds: Normal heart sounds.  Pulmonary:     Effort: Pulmonary effort is normal. Tachypnea present. No accessory muscle usage.     Breath sounds: Examination of the right-lower field reveals rales. Examination of the left-lower field reveals rales. Rales present.  Chest:     Chest wall: Tenderness (midline over sternum) present.  Abdominal:     Palpations: Abdomen is soft.     Tenderness: There is no abdominal tenderness.  Skin:    General: Skin is warm and dry.  Neurological:     Mental Status: She is alert.  Psychiatric:        Mood and Affect: Mood is not anxious.     ED Results / Procedures / Treatments   Labs (all labs ordered are listed, but only abnormal results are displayed) Labs Reviewed  COMPREHENSIVE METABOLIC PANEL - Abnormal; Notable for the  following components:      Result Value   Potassium 2.8 (*)    Glucose, Bld 127 (*)    Calcium 7.1 (*)    Albumin 3.3 (*)    All other components within normal limits  LACTIC ACID, PLASMA - Abnormal; Notable for the following components:   Lactic Acid, Venous <0.3 (*)    All other components within normal limits  D-DIMER, QUANTITATIVE (NOT AT [facility]) - Abnormal; Notable for the following components:   D-Dimer, Quant 0.93 (*)    All other components within normal limits  LACTATE DEHYDROGENASE - Abnormal; Notable for the following components:   LDH 261 (*)    All other components within normal limits  FIBRINOGEN - Abnormal; Notable for the following components:   Fibrinogen 580 (*)    All other components within normal limits  TSH - Abnormal; Notable for the following components:   TSH 27.120 (*)    All other components within normal limits  CBG MONITORING, ED - Abnormal; Notable for the following components:   Glucose-Capillary 128 (*)    All other components within normal limits  CULTURE, BLOOD (ROUTINE X 2)  CULTURE,  BLOOD (ROUTINE X 2)  CBC WITH DIFFERENTIAL/PLATELET  LACTIC ACID, PLASMA  PROCALCITONIN  TRIGLYCERIDES  MAGNESIUM  CBC  FERRITIN  C-REACTIVE PROTEIN  HIV ANTIBODY (ROUTINE TESTING W REFLEX)  CREATININE, SERUM  CBC WITH DIFFERENTIAL/PLATELET  COMPREHENSIVE METABOLIC PANEL  D-DIMER, QUANTITATIVE (NOT AT [facility])  HEMOGLOBIN A1C  VITAMIN D 25 HYDROXY (VIT D DEFICIENCY, FRACTURES)  PTH, INTACT AND CALCIUM  PHOSPHORUS  ABO/RH    EKG EKG Interpretation  Date/Time:  Saturday May 14 2020 12:19:10 EDT Ventricular Rate:  104 PR Interval:    QRS Duration: 82 QT Interval:  336 QTC Calculation: 442 R Axis:   68 Text Interpretation: Sinus tachycardia Borderline repolarization abnormality 12 Lead; [place] no significant change since Aug 2019 Confirmed by Sherwood Gambler [phone]) on 05/14/2020 5:05:33 PM   Radiology DG Chest 2 View  Result Date:  05/14/2020 CLINICAL DATA:  Patient reports she tested positive for covid on 8/23. For past two days has developed worsening shobr. Patient says she is coughing so much it hurts to breathe and has caused chest pain 6/10. EXAM: CHEST - 2 VIEW COMPARISON:  Chest radiograph 05/03/2018 FINDINGS: Increased right hilar prominence, possibly representing adenopathy. Otherwise cardiomediastinal contours within normal limits. There are bilateral right greater than left patchy airspace opacities concerning for multifocal infection. No pneumothorax or pleural effusion. No acute finding in the visualized skeleton. IMPRESSION: 1. Bilateral right greater than left patchy airspace opacities concerning for multifocal pneumonia. 2. Increased right hilar prominence, possibly representing reactive adenopathy. Recommend attention on follow-up. Electronically Signed   By: Audie Pinto M.D.   On: 05/14/2020 13:56    Procedures Procedures (including critical care time)  Medications Ordered in ED Medications  remdesivir 200 mg in sodium chloride 0.9% 250 mL IVPB (0 mg Intravenous Stopped 05/14/20 2205)    Followed by  remdesivir 100 mg in sodium chloride 0.9 % 100 mL IVPB (has no administration in time range)  enoxaparin (LOVENOX) injection 60 mg (60 mg Subcutaneous Given 05/14/20 2158)  sodium chloride flush (NS) 0.9 % injection 3 mL (3 mLs Intravenous Not Given 05/14/20 2129)  sodium chloride flush (NS) 0.9 % injection 3 mL (has no administration in time range)  0.9 %  sodium chloride infusion (has no administration in time range)  albuterol (VENTOLIN HFA) 108 (90 Base) MCG/ACT inhaler 2 puff (2 puffs Inhalation Given 05/14/20 2156)  dexamethasone (DECADRON) tablet 6 mg (has no administration in time range)  guaiFENesin-dextromethorphan (ROBITUSSIN DM) 100-10 MG/5ML syrup 10 mL (has no administration in time range)  zinc sulfate capsule 220 mg (220 mg Oral Given 05/14/20 2201)  ascorbic acid (VITAMIN C) tablet 1,000 mg  (1,000 mg Oral Given 05/14/20 2157)  linagliptin (TRADJENTA) tablet 5 mg (has no administration in time range)  insulin aspart (novoLOG) injection 0-20 Units (has no administration in time range)  acetaminophen (TYLENOL) tablet 650 mg (has no administration in time range)  traMADol (ULTRAM) tablet 50 mg (has no administration in time range)  senna-docusate (Senokot-S) tablet 1 tablet (has no administration in time range)  ondansetron (ZOFRAN) tablet 4 mg (has no administration in time range)    Or  ondansetron (ZOFRAN) injection 4 mg (has no administration in time range)  potassium chloride SA (KLOR-CON) CR tablet 20 mEq (has no administration in time range)  atorvastatin (LIPITOR) tablet 20 mg (has no administration in time range)  chlorthalidone (HYGROTON) tablet 25 mg (has no administration in time range)  lisinopril (ZESTRIL) tablet 5 mg (has no administration in time range)  DULoxetine (  CYMBALTA) DR capsule 60 mg (has no administration in time range)  levothyroxine (SYNTHROID) tablet 300 mcg (has no administration in time range)  Plecanatide TABS 1 tablet (has no administration in time range)  cyclobenzaprine (FLEXERIL) tablet 10 mg (has no administration in time range)  topiramate (TOPAMAX) tablet 100 mg (has no administration in time range)  albuterol (VENTOLIN HFA) 108 (90 Base) MCG/ACT inhaler 2 puff (has no administration in time range)  cholecalciferol (VITAMIN D3) tablet 50,000 Units (has no administration in time range)  melatonin tablet 5 mg (has no administration in time range)  sodium chloride 0.9 % bolus 1,000 mL (0 mLs Intravenous Stopped 05/14/20 2205)  fentaNYL (SUBLIMAZE) injection 100 mcg (100 mcg Intravenous Given 05/14/20 1802)  potassium chloride SA (KLOR-CON) CR tablet 40 mEq (40 mEq Oral Given 05/14/20 2007)  dexamethasone (DECADRON) injection 10 mg (10 mg Intravenous Given 05/14/20 2007)    ED Course  I have reviewed the triage vital signs and the nursing  notes.  Pertinent labs & imaging results that were available during my care of the patient were reviewed by me and considered in my medical decision making (see chart for details).    MDM Rules/Calculators/A&P                          Patient is tachypneic but in no acute respiratory distress.  While she is having normal sats at rest, her O2 dropped to 82% quickly while walking.  Given this along with her comorbidities she will need admission. She also is noted to have hypokalemia, will replete and start magnesium.   Emily Mathis was evaluated in Emergency Department on 05/14/2020 for the symptoms described in the history of present illness. She was evaluated in the context of the global COVID-19 pandemic, which necessitated consideration that the patient might be at risk for infection with the SARS-CoV-2 virus that causes COVID-19. Institutional protocols and algorithms that pertain to the evaluation of patients at risk for COVID-19 are in a state of rapid change based on information released by regulatory bodies including the CDC and federal and state organizations. These policies and algorithms were followed during the patient's care in the ED.  Final Clinical Impression(s) / ED Diagnoses Final diagnoses:  Pneumonia due to COVID-19 virus  Acute respiratory failure with hypoxia ([place])  Hypokalemia    Rx / DC Orders ED Discharge Orders    None       Sherwood Gambler, MD 05/14/20 2248

## 2020-05-14 NOTE — ED Notes (Signed)
Note: we were only able to collect one blood culture via u/s-guided I.V. she remains in no distress.

## 2020-05-14 NOTE — ED Notes (Signed)
Note: upon standing her SPO2 goes down to 82%. Her SPO2 then goes back up to 95% while at rest.

## 2020-05-14 NOTE — H&P (Addendum)
History and Physical    Emily Mathis JAS:[id number] DOB: [birth year] DOA: 05/14/2020  PCP: Jenel Lucks, PA-C  Patient coming from: home  I have personally briefly reviewed patient's old medical records in [place]  Chief Complaint: "I just couldn't breath"   HPI: Emily Mathis is a 54 y.o. female with medical history significant for RA, type 2 diabetes, HTN, asthma, fibromyalgia, hypothyroidism, obesity, GERD, hypocalcemia, chronic pancreatitis, DDD, constipation and OSA.. She presented to ER with worsening shortness of breath due to Covid -19. She states over the last few days she developed a cough with worsening shortness of breath to the point that she couldn't breath. She states symtpoms started last Friday (8 days ago). Her daughter was sick last Wednesday and she watched her grandkids who also tested positive for covid. She had a positive Covid test as well last Friday.  She states she started with fever and chills and body aches. She also has had headaches.  Her last fever was last night to 101.4.  She then started to cough and it has gotten progressively worse. Cough is dry in nature. No post tussive emesis.  She states she would be extremely short of breath with exertion and to just walk across the room would take it out of her.  No stomach pain, nausea/vomiting or diarrhea. She denies any wheezing.  She denies any long travel, leg swelling, chest pain. She does have a history of asthma. Has never smoked. She is not vaccinated for covid. She has been taking mucinex cold plus tylenol cold and flu.  ED Course:  Presented to ED with worsening shortness of breath from covid 19. Upon arrival her vitals were stable with oxygen to 95% on room air; however, with any movement across room oxygen dropped into lower 80s. CXR was significant for bibasilar pneumonia. Lab work revealed hypokalemia to 2.8, hypocalcemia to 7.1, D-dimer mildly elevated to .93, LDh and fibrinogen mildly  elevated. Lactic acid and procalcitonin remain wnl. She was given first dose of decadron and remdesivir in ER. Blood pressure was elevated but she has not taken her home meds.   Review of Systems: As per HPI otherwise all other systems reviewed and are negative.   Past Medical History:  Diagnosis Date  . Abnormal Pap smear of cervix 1990   Hx cryotherapy to cervix  . Abnormal uterine bleeding   . Anemia 02/20/16   Transfusion of 1 unit pRBCs  . Anxiety   . Anxiety and depression   . Arthritis   . Asthma   . Bulging lumbar disc 2021  . 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 Chest pain    per pt due to anemia-   . Diabetes mellitus without complication ([place])   . Fibroid   . Fibromyalgia   . Generalized pain   . GERD (gastroesophageal reflux disease)   . Gout    in big toe  . Headache(784.0)   . History of blood transfusion 02/20/2016  . Hyperlipidemia   . Hypertension    per Dr. Irven Shelling note 08/30/2011  . Hypothyroidism   . Iron deficiency anemia 02/15/2016  . Leg swelling    bilateral  . Mild scoliosis 08/06/2017  . Neuromuscular disorder ([place])   . Osteoarthritis    in knees  . Rheumatoid arthritis ([place])   . Scoliosis    mild  . Shortness of breath dyspnea    on exertion due to anemia  . Sleep apnea    cpap machine  .  Thyroid disease    Hx 3 goiters--hx thyroidectomy--sees Dr. Forde Dandy    Past Surgical History:  Procedure Laterality Date  . CARDIAC CATHETERIZATION  2013  . CESAREAN SECTION  1995  . CESAREAN SECTION  1996  . COLONOSCOPY    . CYSTOSCOPY N/A 06/11/2016   Procedure: CYSTOSCOPY;  Surgeon: [name], MD;  Location: [facility];  Service: Gynecology;  Laterality: N/A;  . HYSTEROSCOPY WITH D & C N/A 04/10/2016   Procedure: HYSTEROSCOPY WITH fractional dilatation and curretage;  Surgeon: [name], MD;  Location: [facility];  Service: Gynecology;  Laterality: N/A;  . LEFT HEART CATHETERIZATION WITH CORONARY ANGIOGRAM  07/24/2011    Procedure: LEFT HEART CATHETERIZATION WITH CORONARY ANGIOGRAM;  Surgeon: Laverda Page, MD;  Location: [facility] CATH LAB;  Service: Cardiovascular;;  . ROBOTIC ASSISTED TOTAL HYSTERECTOMY WITH SALPINGECTOMY Bilateral 06/11/2016   Procedure: ROBOTIC ASSISTED TOTAL HYSTERECTOMY WITH SALPINGECTOMY;  Surgeon: [name], MD;  Location: [facility];  Service: Gynecology;  Laterality: Bilateral;  . THYROIDECTOMY N/A 12/18/2012   Procedure: TOTAL THYROIDECTOMY;  Surgeon: Earnstine Regal, MD;  Location: [facility];  Service: General;  Laterality: N/A;  . UPPER GI ENDOSCOPY      Social History  reports that she has never smoked. She has never used smokeless tobacco. She reports previous alcohol use. She reports that she does not use drugs.  Allergies  Allergen Reactions  . Dilaudid [Hydromorphone Hcl] Itching  . Hydrocodone Itching  . Latex Itching and Other (See Comments)    leaves marks on skin    Family History  Problem Relation Age of Onset  . Heart disease Mother   . Stroke Mother   . Hypertension Mother   . Heart disease Father   . Colon cancer Father   . Hypertension Father   . Diabetes Father   . Cancer Paternal Grandmother   . Asthma Daughter   . Seizures Daughter   . Hypertension Sister   . Thyroid disease Sister   . Mental illness Daughter 17       suicide attempt 06/2013  . Asthma Sister   . Hypertension Sister   . Breast cancer Paternal Aunt   . Cancer Paternal Aunt     Prior to Admission medications   Medication Sig Start Date End Date Taking? Authorizing Provider  albuterol (PROVENTIL HFA;VENTOLIN HFA) 108 (90 Base) MCG/ACT inhaler Inhale 2 puffs into the lungs every 4 (four) hours as needed for wheezing or shortness of breath (cough, chest tightness). Patient taking differently: Inhale 2 puffs into the lungs every 4 (four) hours as needed for wheezing or shortness of breath (cough, chest tightness). ProAir 09/06/17  Yes Jeffery, [name], PA  atorvastatin (LIPITOR) 20  MG tablet TAKE 1 TABLET(20 MG) BY MOUTH DAILY Patient taking differently: Take 20 mg by mouth daily.  12/24/17  Yes Jeffery, [name], PA  Canagliflozin-metFORMIN HCl (INVOKAMET) 150-500 MG TABS Take 1 tablet by mouth daily. 02/26/18  Yes Jeffery, Domingo Mend, PA  celecoxib (CELEBREX) 200 MG capsule Take 1 capsule (200 mg total) by mouth daily. 02/26/18  Yes Jeffery, [name], PA  chlorthalidone (HYGROTON) 25 MG tablet TAKE 1 TABLET(25 MG) BY MOUTH DAILY Patient taking differently: Take 25 mg by mouth daily.  12/24/17  Yes Harrison Mons, PA  conjugated estrogens (PREMARIN) vaginal cream Use 1/2 g vaginally every night at bed time for the first 2 weeks, then use 1/2 g vaginally two times per week. 11/25/18  Yes Yisroel Ramming,  Everardo All, MD  cyclobenzaprine (FLEXERIL) 10 MG tablet Take 1 tablet (10 mg total) by mouth 3 (three) times daily as needed for muscle spasms. 11/12/16  Yes Jeffery, [name], PA  DULoxetine (CYMBALTA) 60 MG capsule Take 60 mg by mouth daily.  04/15/19  Yes [provider]  etanercept (ENBREL) 50 MG/ML injection Inject 50 mg into the skin every Sunday.   Yes [provider]  fluticasone (FLONASE) 50 MCG/ACT nasal spray Place 2 sprays into both nostrils daily as needed for allergies or rhinitis.  04/15/19  Yes [provider]  gabapentin (NEURONTIN) 300 MG capsule Take 1 capsule by mouth 2 (two) times daily. 02/16/19  Yes [provider]  ibuprofen (ADVIL,MOTRIN) 200 MG tablet Take 400 mg by mouth every 6 (six) hours as needed for headache (pain).   Yes [provider]  levothyroxine (SYNTHROID, LEVOTHROID) 300 MCG tablet Take 1 tablet (300 mcg total) by mouth daily. Patient taking differently: Take 300 mcg by mouth daily before breakfast.  06/17/15  Yes Jeffery, [name], PA  lisinopril (PRINIVIL,ZESTRIL) 5 MG tablet Take 1 tablet (5 mg total) by mouth daily. 12/24/17  Yes Jeffery, [name], PA  nystatin (MYCOSTATIN) powder Apply topically 4 (four) times  daily. Patient taking differently: Apply topically 4 (four) times daily as needed (heat rash).  10/02/15  Yes Jeffery, [name], PA  traMADol (ULTRAM) 50 MG tablet Take 50 mg by mouth every 6 (six) hours as needed for moderate pain.   Yes [provider]  ALPRAZolam (XANAX) 0.5 MG tablet TAKE 1 TABLET(0.5 MG) BY MOUTH AT BEDTIME AS NEEDED FOR SLEEP Patient taking differently: Take 0.5 mg by mouth 2 (two) times daily as needed for anxiety.  02/26/18   Harrison Mons, PA  diazepam (VALIUM) 5 MG tablet TAKE 1 TABLET(5 MG) BY MOUTH DAILY AS NEEDED FOR MUSCLE SPASMS Patient taking differently: Take 5 mg by mouth 2 (two) times daily as needed for muscle spasms.  02/26/18   Harrison Mons, PA  docusate sodium (COLACE) 250 MG capsule Take 1 capsule (250 mg total) by mouth daily. 09/06/16   Harrison Mons, PA  meclizine (ANTIVERT) 25 MG tablet Take by mouth. 04/15/19   [provider]  polyethylene glycol (MIRALAX / GLYCOLAX) packet Take 17 g by mouth 2 (two) times daily. Take 2 packets today (day one), wait 2 hours, if no stool produced then take 2 more packets on day one; then continue taking 2 packets daily until you achieve daily soft stools; if water stool develops then cut back to 1 packet daily. Patient taking differently: Take 17 g by mouth daily as needed for mild constipation.  07/24/16   Street, [place], PA-C  potassium chloride SA (K-DUR,KLOR-CON) 20 MEQ tablet Take 1 tablet (20 mEq total) by mouth 2 (two) times daily. Patient taking differently: Take 20 mEq by mouth as needed.  06/12/16   [name], MD  senna (SENOKOT) 8.6 MG TABS tablet Take 1 tablet (8.6 mg total) by mouth daily as needed for mild constipation. 08/22/16   Tonette Bihari, MD    Physical Exam: Vitals:   05/14/20 1830 05/14/20 1845 05/14/20 1900 05/14/20 2050  BP: (!) 145/83 (!) 151/80  (!) 162/82  Pulse: 87 84 84 95  Resp: (!) 21 19 20 18   Temp:      TempSrc:      SpO2: 95% 100% 98% 93%   Weight:      Height:        Constitutional: NAD,  calm, comfortable Vitals:   05/14/20 1830 05/14/20 1845 05/14/20 1900 05/14/20 2050  BP: (!) 145/83 (!) 151/80  (!) 162/82  Pulse: 87 84 84 95  Resp: (!) 21 19 20 18   Temp:      TempSrc:      SpO2: 95% 100% 98% 93%  Weight:      Height:       Eyes: PERRL, lids and conjunctivae normal ENMT: mucous membranes moist  Neck: normal, supple, no masses, no thyromegaly Respiratory: quiet rales bilaterally, no wheezing.  Normal respiratory effort, but tachypnea is present. No accessory muscle use. Talking in full sentences.  Cardiovascular: Regular rate and rhythm, no murmurs / rubs / gallops. No extremity edema. 2+ pedal pulses. No carotid bruits. TTP over sternal bone.  Abdomen: no tenderness, no masses palpated. No hepatosplenomegaly. Bowel sounds positive.  Musculoskeletal: no clubbing / cyanosis. No joint deformity upper and lower extremities. Good ROM, no contractures. Normal muscle tone.  Skin: no rashes, lesions, ulcers. No induration Neurologic: CN 2-12 grossly intact. Sensation intact, DTR normal. Strength 5/5 in all 4.  Psychiatric: Normal judgment and insight. Alert and oriented x 3. Normal mood.     Labs on Admission: I have personally reviewed following labs and imaging studies  CBC: Recent Labs  Lab 05/14/20 1754  WBC 5.5  NEUTROABS 2.2  HGB 12.8  HCT 40.0  MCV 97.1  PLT 818    Basic Metabolic Panel: Recent Labs  Lab 05/14/20 1754 05/14/20 2006  NA 139  --   K 2.8*  --   CL 99  --   CO2 28  --   GLUCOSE 127*  --   BUN 10  --   CREATININE 0.83  --   CALCIUM 7.1*  --   MG  --  2.0    GFR: Estimated Creatinine Clearance: 99.7 mL/min (by C-G formula based on SCr of 0.83 mg/dL).  Liver Function Tests: Recent Labs  Lab 05/14/20 1754  AST 25  ALT 17  ALKPHOS 48  BILITOT 0.7  PROT 7.9  ALBUMIN 3.3*    Urine analysis:    Component Value Date/Time   COLORURINE YELLOW 11/28/2017 0405    APPEARANCEUR CLEAR 11/28/2017 0405   LABSPEC 1.029 11/28/2017 0405   PHURINE 6.0 11/28/2017 0405   GLUCOSEU >=500 (A) 11/28/2017 0405   HGBUR NEGATIVE 11/28/2017 0405   BILIRUBINUR n 11/25/2018 1150   KETONESUR NEGATIVE 11/28/2017 0405   PROTEINUR Negative 11/25/2018 1150   PROTEINUR NEGATIVE 11/28/2017 0405   UROBILINOGEN 0.2 11/25/2018 1150   UROBILINOGEN 0.2 03/10/2014 1552   NITRITE n 11/25/2018 1150   NITRITE NEGATIVE 11/28/2017 0405   LEUKOCYTESUR Negative 11/25/2018 1150    Radiological Exams on Admission: DG Chest 2 View  Result Date: 05/14/2020 CLINICAL DATA:  Patient reports she tested positive for covid on 8/23. For past two days has developed worsening shobr. Patient says she is coughing so much it hurts to breathe and has caused chest pain 6/10. EXAM: CHEST - 2 VIEW COMPARISON:  Chest radiograph 05/03/2018 FINDINGS: Increased right hilar prominence, possibly representing adenopathy. Otherwise cardiomediastinal contours within normal limits. There are bilateral right greater than left patchy airspace opacities concerning for multifocal infection. No pneumothorax or pleural effusion. No acute finding in the visualized skeleton. IMPRESSION: 1. Bilateral right greater than left patchy airspace opacities concerning for multifocal pneumonia. 2. Increased right hilar prominence, possibly representing reactive adenopathy. Recommend attention on follow-up. Electronically Signed   By: Audie Pinto M.D.   On: 05/14/2020  13:56    EKG: Independently reviewed. nsr with rate of 89. No st wave abnormality.   Assessment/Plan Principal Problem:   Pneumonia due to COVID-19 virus Active Problems:   Essential hypertension   Type 2 diabetes mellitus without complication, without long-term current use of insulin (HCC)   Hypokalemia   Rheumatoid arthritis (HCC)   Hypocalcemia   Hypothyroidism, postsurgical   OSA (obstructive sleep apnea)  1) pneumonia due to Covid 19 -confirmed covid  19 last week.  -CXR significant for bilateral right greater than left patchy airspace opacities concerning for multifocal pneumonia. Increased right hilar prominence: will repeat CXR in the AM.  -serial inflammatory markers -IV redesmivir, oral steroids -high dose vitamin C, zinc and D -melatonin at night -albuterol inhaler  -watch oxygenation especially with walking, right now doing fine on RA.   2) hypokalemia -has not been taking home potassium.  -magnesium normal  -? Excess laxatives with constipation hx.  -repleted in ER with 93meq and will resume home dose of 51meq BID -recheck labs in AM  3) type 2 diabetes without insulin -checking a1c-7.3 -tradjenta started for covid benefits -continue statin  -ssi and diabetic diet. May need bolus with steroids.  -diabetic education/nutrition consult   4) essential HTN -above goal, but has not taken medication today. Will give tonight -poor fitting cuff likely also contributing.  -continue to monitor and adjust home medication as needed   5) hypocalcemia -chronic issue -checking vit. D, PTH and phosphorous. Magnesium  Normal  -recheck in AM  7) hypothyroidism -checking TSH -resume home dose of synthroid  8) OSA  -does not know home settings for her cpap -no cpap at this time.   9) RA -continue pain medication with tramadol prn. Hold NSAIDs -enbrel last given 8/22   DVT prophylaxis: lovenox  Code Status:   full  Family Communication:  Patient and she asked that I call her daughters Orion Crook and Cristal Deer. Called both daughters and informed them that their mom was getting admitted chelsea boyd would like to be updated daily: [phone].   Disposition Plan:   Patient is from:  home  Anticipated DC to:  home  Anticipated DC date:  2-4 days    Consults called:  none  Admission status:  inpatient   Severity of Illness: The appropriate patient status for this patient is INPATIENT. Inpatient status is judged to be  reasonable and necessary in order to provide the required intensity of service to ensure the patient's safety. The patient's presenting symptoms, physical exam findings, and initial radiographic and laboratory data in the context of their chronic comorbidities is felt to place them at high risk for further clinical deterioration. Furthermore, it is not anticipated that the patient will be medically stable for discharge from the hospital within 2 midnights of admission. The following factors support the patient status of inpatient.   " The patient's presenting symptoms include covid 19 pneumonia with hypoxia with exertion, hypokalemia.    * I certify that at the point of admission it is my clinical judgment that the patient will require inpatient hospital care spanning beyond 2 midnights from the point of admission due to high intensity of service, high risk for further deterioration and high frequency of surveillance required.*     Orma Flaming MD [facility]  How to contact the [facility] Attending or Consulting provider [place] or covering provider during after hours [place], for this patient?   1. Check the care team in [facility] and  look for a) attending/consulting [facility] provider listed and b) the [facility] team listed 2. Log into [email or web address] and use [place]'s universal password to access. If you do not have the password, please contact the hospital operator. 3. Locate the [facility] provider you are looking for under [facility] and page to a number that you can be directly reached. 4. If you still have difficulty reaching the provider, please page the [facility] (Director on Call) for the Hospitalists listed on amion for assistance.  05/14/2020, 10:32 PM

## 2020-05-14 NOTE — ED Triage Notes (Signed)
Patient reports she tested positive for covid on 8/23. For past two days has developed worsening shobr. Patient says she is coughing so much it hurts to breathe and has caused chest pain 6/10. o2 sat 95 in triage.

## 2020-05-15 ENCOUNTER — Encounter (HOSPITAL_COMMUNITY): Payer: Self-pay | Admitting: Family Medicine

## 2020-05-15 ENCOUNTER — Inpatient Hospital Stay (HOSPITAL_COMMUNITY): Payer: BLUE CROSS/BLUE SHIELD

## 2020-05-15 LAB — CBC WITH DIFFERENTIAL/PLATELET
Abs Immature Granulocytes: 0.02 10*3/uL (ref 0.00–0.07)
Basophils Absolute: 0 10*3/uL (ref 0.0–0.1)
Basophils Relative: 1 %
Eosinophils Absolute: 0 10*3/uL (ref 0.0–0.5)
Eosinophils Relative: 0 %
HCT: 37.7 % (ref 36.0–46.0)
Hemoglobin: 11.9 g/dL — ABNORMAL LOW (ref 12.0–15.0)
Immature Granulocytes: 1 %
Lymphocytes Relative: 27 %
Lymphs Abs: 1.2 10*3/uL (ref 0.7–4.0)
MCH: 30.8 pg (ref 26.0–34.0)
MCHC: 31.6 g/dL (ref 30.0–36.0)
MCV: 97.7 fL (ref 80.0–100.0)
Monocytes Absolute: 0.1 10*3/uL (ref 0.1–1.0)
Monocytes Relative: 2 %
Neutro Abs: 3 10*3/uL (ref 1.7–7.7)
Neutrophils Relative %: 69 %
Platelets: 265 10*3/uL (ref 150–400)
RBC: 3.86 MIL/uL — ABNORMAL LOW (ref 3.87–5.11)
RDW: 14.7 % (ref 11.5–15.5)
WBC: 4.3 10*3/uL (ref 4.0–10.5)
nRBC: 0 % (ref 0.0–0.2)

## 2020-05-15 LAB — CBG MONITORING, ED
Glucose-Capillary: 210 mg/dL — ABNORMAL HIGH (ref 70–99)
Glucose-Capillary: 196 mg/dL — ABNORMAL HIGH (ref 70–99)
Glucose-Capillary: 173 mg/dL — ABNORMAL HIGH (ref 70–99)
Glucose-Capillary: 201 mg/dL — ABNORMAL HIGH (ref 70–99)

## 2020-05-15 LAB — COMPREHENSIVE METABOLIC PANEL
ALT: 19 U/L (ref 0–44)
AST: 29 U/L (ref 15–41)
Albumin: 3.2 g/dL — ABNORMAL LOW (ref 3.5–5.0)
Alkaline Phosphatase: 45 U/L (ref 38–126)
Anion gap: 10 (ref 5–15)
BUN: 10 mg/dL (ref 6–20)
CO2: 24 mmol/L (ref 22–32)
Calcium: 6.9 mg/dL — ABNORMAL LOW (ref 8.9–10.3)
Chloride: 102 mmol/L (ref 98–111)
Creatinine, Ser: 0.71 mg/dL (ref 0.44–1.00)
GFR calc Af Amer: >60 mL/min (ref >60)
GFR calc non Af Amer: >60 mL/min (ref >60)
Glucose, Bld: 250 mg/dL — ABNORMAL HIGH (ref 70–99)
Potassium: 3.5 mmol/L (ref 3.5–5.1)
Sodium: 136 mmol/L (ref 135–145)
Total Bilirubin: 0.5 mg/dL (ref 0.3–1.2)
Total Protein: 7.7 g/dL (ref 6.5–8.1)

## 2020-05-15 LAB — SARS CORONAVIRUS 2 BY RT PCR (HOSPITAL ORDER, PERFORMED IN ~~LOC~~ HOSPITAL LAB): SARS Coronavirus 2: POSITIVE — AB

## 2020-05-15 LAB — ABO/RH: ABO/RH(D): O POS

## 2020-05-15 LAB — D-DIMER, QUANTITATIVE: D-Dimer, Quant: 0.95 ug/mL-FEU — ABNORMAL HIGH (ref 0.00–0.50)

## 2020-05-15 LAB — GLUCOSE, CAPILLARY: Glucose-Capillary: 161 mg/dL — ABNORMAL HIGH (ref 70–99)

## 2020-05-15 LAB — HEMOGLOBIN A1C
Hgb A1c MFr Bld: 7.3 % — ABNORMAL HIGH (ref 4.8–5.6)
Mean Plasma Glucose: 162.81 mg/dL

## 2020-05-15 LAB — FERRITIN
Ferritin: 254 ng/mL (ref 11–307)
Ferritin: 259 ng/mL (ref 11–307)

## 2020-05-15 LAB — C-REACTIVE PROTEIN
CRP: 11.6 mg/dL — ABNORMAL HIGH (ref <1.0)
CRP: 10.3 mg/dL — ABNORMAL HIGH (ref <1.0)

## 2020-05-15 LAB — VITAMIN D 25 HYDROXY (VIT D DEFICIENCY, FRACTURES): Vit D, 25-Hydroxy: 23.35 ng/mL — ABNORMAL LOW (ref 30–100)

## 2020-05-15 LAB — HIV ANTIBODY (ROUTINE TESTING W REFLEX): HIV Screen 4th Generation wRfx: NONREACTIVE

## 2020-05-15 MED ORDER — CALCIUM CARBONATE-VITAMIN D 500-200 MG-UNIT PO TABS
1.0000 | ORAL_TABLET | Freq: Every day | ORAL | Status: DC
  Administered 2020-05-15 – 2020-05-17 (×3): 1 via ORAL
  Filled 2020-05-15 (×3): qty 1

## 2020-05-15 MED ORDER — PANTOPRAZOLE SODIUM 40 MG IV SOLR
40.0000 mg | INTRAVENOUS | Status: DC
  Administered 2020-05-15 – 2020-05-17 (×3): 40 mg via INTRAVENOUS
  Filled 2020-05-15 (×3): qty 40

## 2020-05-15 MED ORDER — LEVOTHYROXINE SODIUM 100 MCG PO TABS
300.0000 ug | ORAL_TABLET | Freq: Every day | ORAL | Status: DC
  Administered 2020-05-16 – 2020-05-17 (×2): 300 ug via ORAL
  Filled 2020-05-15 (×3): qty 3

## 2020-05-15 MED ORDER — ENOXAPARIN SODIUM 60 MG/0.6ML ~~LOC~~ SOLN
60.0000 mg | SUBCUTANEOUS | Status: DC
  Administered 2020-05-15 – 2020-05-16 (×2): 60 mg via SUBCUTANEOUS
  Filled 2020-05-15 (×3): qty 0.6

## 2020-05-15 MED ORDER — METHYLPREDNISOLONE SODIUM SUCC 40 MG IJ SOLR
40.0000 mg | Freq: Two times a day (BID) | INTRAMUSCULAR | Status: DC
  Administered 2020-05-15 – 2020-05-17 (×5): 40 mg via INTRAVENOUS
  Filled 2020-05-15 (×5): qty 1

## 2020-05-15 MED ORDER — VITAMIN D (ERGOCALCIFEROL) 1.25 MG (50000 UNIT) PO CAPS
50000.0000 [IU] | ORAL_CAPSULE | Freq: Once | ORAL | Status: AC
  Administered 2020-05-15: 50000 [IU] via ORAL
  Filled 2020-05-15: qty 1

## 2020-05-15 MED ORDER — INSULIN GLARGINE 100 UNIT/ML ~~LOC~~ SOLN
10.0000 [IU] | Freq: Every day | SUBCUTANEOUS | Status: DC
  Administered 2020-05-15: 10 [IU] via SUBCUTANEOUS
  Filled 2020-05-15 (×2): qty 0.1

## 2020-05-15 MED ORDER — CALCIUM CITRATE-VITAMIN D 500-500 MG-UNIT PO CHEW: 1.0000 | CHEWABLE_TABLET | Freq: Every day | ORAL | Status: DC

## 2020-05-15 NOTE — ED Notes (Signed)
She is resting quietly and arouses easily and tells me she feels "much better".

## 2020-05-15 NOTE — ED Notes (Signed)
I have just called report to Maudie Mercury, RN on [address]. She is being transported as I write this.

## 2020-05-15 NOTE — Progress Notes (Signed)
PROGRESS NOTE  Emily Mathis  DOB: [birth year]  PCP: Emily Lucks, PA-C BTD:[id number]  DOA: 05/14/2020  LOS: 1 day   Chief Complaint  Patient presents with  . covid positive  . Shortness of Breath   Brief narrative: Emily Mathis is a 54 y.o. female with extensive past medical history including DM2, HTN, HLD, morbid obesity, OSA on CPAP, chronic bilateral pedal edema, rheumatoid arthritis, hypothyroidism, iron deficiency anemia, chronic pancreatitis, chronic hypocalcemia, anxiety and depression. Patient presented to the ED on 05/14/2020 with worsening shortness of breath, cough due to COVID-19. 2 weeks ago, she was exposed to her daughter's kids were Covid positive.  In 3 to 4 days, she started feeling short of breath. 8/20, patient tested positive for Covid PCR as an outpatient.  Did not require hospitalization at that time.  Symptoms continue to worsen at home.  She had a fever of 101.41-day prior to presentation.  She was extremely short of breath and was unable to carry out her activities of daily living. Of note, she did not get Covid vaccine because 'They told me it is antichrist.'  In the ED, she was afebrile, heart rate 1 6, blood pressure 145/89, oxygen saturation more than 90% on room air at rest but any movement out of bed would make her short of breath and dropped saturation to low 80s. Initial labs with WBC normal, lactic acid normal, procalcitonin normal, CRP elevated to 11.6, LDH elevated to 261, potassium low at 2.8, calcium level low at 7.1. Chest x-ray showed bilateral multifocal pneumonia, R>L  Patient was admitted to hospitalist service for COVID-19 pneumonia.  Subjective: Patient was seen and examined this morning. Middle-aged AA female lying on bed.  Not on supplemental oxygen at rest but says she gets winded easily on minimal movement. Chart reviewed.  No fever.  Heart rate in 80s. Labs this morning with potassium improved to  3.5  Assessment/Plan: COVID pneumonia Acute respiratory failure with hypoxia  -Presented with worsening shortness of breath -COVID test: PCR positive on 8/19 -Chest imaging: Multifocal pneumonia, right more than left  -Treatment: Currently on 5-day course of IV remdesivir to complete on 9/1. IV Solu-Medrol 40 mg twice daily.  Not requiring Actemra or baricitinib at this time. -Oxygen - SpO2: 94 % (She continues to be on room air) at rest.  Encourage monitoring ambulation.  -Progression: Feels better than at presentation.  Continue above-mentioned treatment. -Protonix while on steroids. -Supportive care: Vitamin C, Zinc, PRN inhalers, Tylenol, Antitussives (benzonatate/ Mucinex/Tussionex).   -Encouraged incentive spirometry, out of bed and early mobilization as much as possible -Continue airborne/contact isolation precautions for duration of 3 weeks from the day of diagnosis. -WBC and inflammatory markers trend as below.  Recent Labs  Lab 05/14/20 1754 05/14/20 1755 05/14/20 2006 05/14/20 2205 05/15/20 0500 05/15/20 1040  WBC 5.5  --   --  5.4 4.3  --   LATICACIDVEN <0.3*  --  1.1  --   --   --   PROCALCITON  --  <0.10  --   --   --   --   DDIMER  --  0.93*  --   --  0.95*  --   FERRITIN  --  254  --   --   --  259  LDH  --  261*  --   --   --   --   CRP  --  11.6*  --   --   --  10.3*  ALT 17  --   --   --  19  --    The treatment plan and use of medications and known side effects were discussed with patient/family. Some of the medications used are based on case reports/anecdotal data.  All other medications being used in the management of COVID-19 based on limited study data.  Complete risks and long-term side effects are unknown, however in the best clinical judgment they seem to be of some benefit.  Patient wanted to proceed with treatment options provided.  Noncompliance to meds -Patient states she has lot of medicines to keep up with an tries to do better but could be  missing her pills here and there. -Suggested to improve compliance.  Type 2 diabetes mellitus  -A1c 7.3 on 8/28. -Invokana 150 mg daily, Metformin 5 mg daily, Trulance 1 mg daily -Keep oral meds on hold.  Continue sliding scale insulin.  Steroids may lead to further elevated blood sugar.  -Start on Lantus 10 units this morning.  May need scheduled Premeal insulin as well. Lab Results  Component Value Date   HGBA1C 7.3 (H) 05/14/2020   Recent Labs  Lab 05/14/20 2146 05/15/20 0823 05/15/20 1145  GLUCAP 128* 210* 196*   Essential hypertension -Home meds include lisinopril 5 mg daily, chlorthalidone 25 mg daily with potassium supplement. -Resume the same. -Continue to monitor blood pressure.  Hypokalemia -Potassium level is low at 2.8 on admission. -Noncompliant to supplement at home.  Resume supplement.  Continue to monitor. Recent Labs  Lab 05/14/20 1754 05/15/20 0500  K 2.8* 3.5   Chronic hypocalcemia Vitamin D deficiency -Calcium level low.  Vitamin D level low.  Start calcium and vitamin D supplementation.    Hypothyroidism -TSH level elevated to 27.  She is already on high dose of Synthroid at home at 300 mcg.  Suspect noncompliance.  Resume the same dose for now.   Recent Labs    05/14/20 1754  TSH 27.120*   Morbid obesity - Body mass index is 48.01 kg/m. Patient has been advised to make an attempt to improve diet and exercise patterns to aid in weight loss.  Obstructive sleep apnea -Okay to use home CPAP.  Rheumatoid arthritis Chronic pain, muscle spasm -continue pain medication with tramadol prn. Hold NSAIDs -Continue weekly subcu etanercept, reported last dose on 8/22.  Next dose today. -Home meds include PRN Flexeril, tramadol and ibuprofen -Resume Neurontin  Anxiety/depression -Home meds include Cymbalta 60 mg daily, Xanax 0.5 mg as needed at bedtime, Topamax 100 mg daily. -Resume the same.  Hyperlipidemia -Continue Lipitor 20 mg  daily,  Mobility: Encourage ambulation Code Status:   Code Status: Full Code  Nutritional status: Body mass index is 48.01 kg/m.     Diet Order            Diet heart healthy/carb modified Room service appropriate? Yes; Fluid consistency: Thin  Diet effective now                 DVT prophylaxis: Lovenox subcu  Antimicrobials:  None Fluid: None  Consultants: None Family Communication:  None at bedside  Status is: Inpatient  Remains inpatient appropriate because:Ongoing diagnostic testing needed not appropriate for outpatient work up and IV treatments appropriate due to intensity of illness or inability to take PO   Dispo: The patient is from: Home              Anticipated d/c is to: Home               Anticipated d/c date is: 3 days  Patient currently is not medically stable to d/c.       Infusions:  . sodium chloride    . remdesivir 100 mg in NS 100 mL 100 mg (05/15/20 1035)    Scheduled Meds: . albuterol  2 puff Inhalation Q6H  . vitamin C  1,000 mg Oral TID  . atorvastatin  20 mg Oral Daily  . calcium-vitamin D  1 tablet Oral Q breakfast  . chlorthalidone  25 mg Oral Daily  . DULoxetine  60 mg Oral Daily  . enoxaparin (LOVENOX) injection  60 mg Subcutaneous Q24H  . insulin aspart  0-20 Units Subcutaneous TID WC  . insulin glargine  10 Units Subcutaneous Daily  . levothyroxine  300 mcg Oral QAC breakfast  . linagliptin  5 mg Oral Daily  . lisinopril  5 mg Oral Daily  . melatonin  5 mg Oral QHS  . methylPREDNISolone (SOLU-MEDROL) injection  40 mg Intravenous Q12H  . pantoprazole (PROTONIX) IV  40 mg Intravenous Q24H  . potassium chloride SA  20 mEq Oral BID  . sodium chloride flush  3 mL Intravenous Q12H  . topiramate  100 mg Oral Daily  . Vitamin D (Ergocalciferol)  50,000 Units Oral Once  . zinc sulfate  220 mg Oral Daily    Antimicrobials: Anti-infectives (From admission, onward)   Start     Dose/Rate Route Frequency Ordered Stop    05/15/20 1000  remdesivir 100 mg in sodium chloride 0.9 % 100 mL IVPB       "Followed by" Linked Group Details   100 mg 200 mL/hr over 30 Minutes Intravenous Daily 05/14/20 1735 05/19/20 0959   05/15/20 1000  remdesivir 100 mg in sodium chloride 0.9 % 100 mL IVPB  Status:  Discontinued       "Followed by" Linked Group Details   100 mg 200 mL/hr over 30 Minutes Intravenous Daily 05/14/20 2008 05/14/20 2017   05/14/20 2015  remdesivir 200 mg in sodium chloride 0.9% 250 mL IVPB  Status:  Discontinued       "Followed by" Linked Group Details   200 mg 580 mL/hr over 30 Minutes Intravenous Once 05/14/20 2008 05/14/20 2017   05/14/20 1800  remdesivir 200 mg in sodium chloride 0.9% 250 mL IVPB       "Followed by" Linked Group Details   200 mg 580 mL/hr over 30 Minutes Intravenous Once 05/14/20 1735 05/14/20 2205      PRN meds: sodium chloride, acetaminophen, albuterol, cyclobenzaprine, guaiFENesin-dextromethorphan, ondansetron **OR** ondansetron (ZOFRAN) IV, senna-docusate, sodium chloride flush, traMADol   Objective: Vitals:   05/15/20 0800 05/15/20 1203  BP: (!) 142/88 (!) 156/94  Pulse: 84 92  Resp: 17 18  Temp:    SpO2: 94% 92%   No intake or output data in the 24 hours ending 05/15/20 1251 Filed Weights   05/14/20 1222  Weight: 122.9 kg   Weight change:  Body mass index is 48.01 kg/m.   Physical Exam: General exam: Appears calm and comfortable.  Not in physical distress Skin: No rashes, lesions or ulcers. HEENT: Atraumatic, normocephalic, supple neck, no obvious bleeding Lungs: Clear to auscultate bilaterally CVS: Regular rate and rhythm, no murmur GI/Abd soft, nontender, nondistended, bowel sound present CNS: Alert, awake, oriented x3 Psychiatry: Mood appropriate Extremities: No pedal edema, no calf tenderness  Data Review: I have personally reviewed the laboratory data and studies available.  Recent Labs  Lab 05/14/20 1754 05/14/20 2205 05/15/20 0500  WBC 5.5  5.4 4.3  NEUTROABS 2.2  --  3.0  HGB 12.8 12.6 11.9*  HCT 40.0 39.6 37.7  MCV 97.1 97.8 97.7  PLT 270 274 265   Recent Labs  Lab 05/14/20 1754 05/14/20 2005 05/14/20 2006 05/14/20 2205 05/15/20 0500  NA 139  --   --   --  136  K 2.8*  --   --   --  3.5  CL 99  --   --   --  102  CO2 28  --   --   --  24  GLUCOSE 127*  --   --   --  250*  BUN 10  --   --   --  10  CREATININE 0.83  --   --  0.84 0.71  CALCIUM 7.1*  --   --   --  6.9*  MG  --   --  2.0  --   --   PHOS  --  2.7  --   --   --    Lab Results  Component Value Date   HGBA1C 7.3 (H) 05/14/2020       Component Value Date/Time   CHOL 162 11/12/2017 1203   TRIG 109 05/14/2020 1755   HDL 47 11/12/2017 1203   CHOLHDL 3.4 11/12/2017 1203   CHOLHDL 3.2 08/20/2016 0443   VLDL 17 08/20/2016 0443   LDLCALC 97 11/12/2017 1203   LABVLDL 18 11/12/2017 1203   Signed, Terrilee Croak, MD [facility] Pager: [phone] (Secure Chat preferred). 05/15/2020

## 2020-05-16 LAB — CBC WITH DIFFERENTIAL/PLATELET
Abs Immature Granulocytes: 0.05 10*3/uL (ref 0.00–0.07)
Basophils Absolute: 0 10*3/uL (ref 0.0–0.1)
Basophils Relative: 0 %
Eosinophils Absolute: 0 10*3/uL (ref 0.0–0.5)
Eosinophils Relative: 0 %
HCT: 38.1 % (ref 36.0–46.0)
Hemoglobin: 12.2 g/dL (ref 12.0–15.0)
Immature Granulocytes: 1 %
Lymphocytes Relative: 20 %
Lymphs Abs: 1.7 10*3/uL (ref 0.7–4.0)
MCH: 31 pg (ref 26.0–34.0)
MCHC: 32 g/dL (ref 30.0–36.0)
MCV: 96.7 fL (ref 80.0–100.0)
Monocytes Absolute: 0.3 10*3/uL (ref 0.1–1.0)
Monocytes Relative: 4 %
Neutro Abs: 6.5 10*3/uL (ref 1.7–7.7)
Neutrophils Relative %: 75 %
Platelets: 349 10*3/uL (ref 150–400)
RBC: 3.94 MIL/uL (ref 3.87–5.11)
RDW: 14.5 % (ref 11.5–15.5)
WBC: 8.6 10*3/uL (ref 4.0–10.5)
nRBC: 0 % (ref 0.0–0.2)

## 2020-05-16 LAB — GLUCOSE, CAPILLARY
Glucose-Capillary: 212 mg/dL — ABNORMAL HIGH (ref 70–99)
Glucose-Capillary: 195 mg/dL — ABNORMAL HIGH (ref 70–99)
Glucose-Capillary: 228 mg/dL — ABNORMAL HIGH (ref 70–99)
Glucose-Capillary: 159 mg/dL — ABNORMAL HIGH (ref 70–99)

## 2020-05-16 LAB — COMPREHENSIVE METABOLIC PANEL
ALT: 27 U/L (ref 0–44)
AST: 32 U/L (ref 15–41)
Albumin: 3.3 g/dL — ABNORMAL LOW (ref 3.5–5.0)
Alkaline Phosphatase: 43 U/L (ref 38–126)
Anion gap: 11 (ref 5–15)
BUN: 15 mg/dL (ref 6–20)
CO2: 23 mmol/L (ref 22–32)
Calcium: 7.3 mg/dL — ABNORMAL LOW (ref 8.9–10.3)
Chloride: 101 mmol/L (ref 98–111)
Creatinine, Ser: 0.88 mg/dL (ref 0.44–1.00)
GFR calc Af Amer: >60 mL/min (ref >60)
GFR calc non Af Amer: >60 mL/min (ref >60)
Glucose, Bld: 235 mg/dL — ABNORMAL HIGH (ref 70–99)
Potassium: 3.8 mmol/L (ref 3.5–5.1)
Sodium: 135 mmol/L (ref 135–145)
Total Bilirubin: 0.6 mg/dL (ref 0.3–1.2)
Total Protein: 7.6 g/dL (ref 6.5–8.1)

## 2020-05-16 LAB — D-DIMER, QUANTITATIVE: D-Dimer, Quant: 1.27 ug/mL-FEU — ABNORMAL HIGH (ref 0.00–0.50)

## 2020-05-16 LAB — PTH, INTACT AND CALCIUM
Calcium, Total (PTH): 6.9 mg/dL — CL (ref 8.7–10.2)
PTH: 23 pg/mL (ref 15–65)

## 2020-05-16 LAB — FERRITIN: Ferritin: 313 ng/mL — ABNORMAL HIGH (ref 11–307)

## 2020-05-16 LAB — C-REACTIVE PROTEIN: CRP: 6.1 mg/dL — ABNORMAL HIGH (ref <1.0)

## 2020-05-16 MED ORDER — INSULIN ASPART 100 UNIT/ML ~~LOC~~ SOLN
3.0000 [IU] | Freq: Three times a day (TID) | SUBCUTANEOUS | Status: DC
  Administered 2020-05-16 – 2020-05-17 (×3): 3 [IU] via SUBCUTANEOUS

## 2020-05-16 MED ORDER — INSULIN GLARGINE 100 UNIT/ML ~~LOC~~ SOLN
15.0000 [IU] | Freq: Every day | SUBCUTANEOUS | Status: DC
  Administered 2020-05-16 – 2020-05-17 (×2): 15 [IU] via SUBCUTANEOUS
  Filled 2020-05-16 (×2): qty 0.15

## 2020-05-16 NOTE — Progress Notes (Signed)
Patient would like all updates to be given to her daughter Emily Mathis who will then share them with the rest of the family.  Chelsea's number is [phone]

## 2020-05-16 NOTE — Progress Notes (Signed)
PROGRESS NOTE  Emily Mathis  DOB: Dec 13, 1965  PCP: Jenel Lucks, PA-C YKD:[id number]  DOA: 05/14/2020  LOS: 2 days   Chief Complaint  Patient presents with  . covid positive  . Shortness of Breath   Brief narrative: Emily Mathis is a 54 y.o. female with extensive past medical history including DM2, HTN, HLD, morbid obesity, OSA on CPAP, chronic bilateral pedal edema, rheumatoid arthritis, hypothyroidism, iron deficiency anemia, chronic pancreatitis, chronic hypocalcemia, anxiety and depression. Patient presented to the ED on 05/14/2020 with worsening shortness of breath, cough due to COVID-19. 2 weeks ago, she was exposed to her daughter's kids were Covid positive.  In 3 to 4 days, she started feeling short of breath. 8/20, patient tested positive for Covid PCR as an outpatient.  Did not require hospitalization at that time.  Symptoms continue to worsen at home.  She had a fever of 101.41-day prior to presentation.  She was extremely short of breath and was unable to carry out her activities of daily living. Of note, she did not get Covid vaccine because 'They told me it is antichrist.'  In the ED, she was afebrile, heart rate 1 6, blood pressure 145/89, oxygen saturation more than 90% on room air at rest but any movement out of bed would make her short of breath and dropped saturation to low 80s. Initial labs with WBC normal, lactic acid normal, procalcitonin normal, CRP elevated to 11.6, LDH elevated to 261, potassium low at 2.8, calcium level low at 7.1. Chest x-ray showed bilateral multifocal pneumonia, R>L  Patient was admitted to hospitalist service for COVID-19 pneumonia.  Subjective: Patient was seen and examined this morning. Feels better. Not on supplemental oxygen at rest but remains in bed only.  Needs to move. Chart reviewed. No fever, heart rate in 90s, blood pressure normalized today Labs this morning with CRP level improving  Assessment/Plan: COVID  pneumonia Acute respiratory failure with hypoxia  -Presented with worsening shortness of breath -COVID test: PCR positive on 8/19 -Chest imaging: Multifocal pneumonia, right more than left  -Treatment: Currently on 5-day course of IV remdesivir to complete on 9/1. IV Solu-Medrol 40 mg twice daily.  Not requiring Actemra or baricitinib at this time. -Oxygen - SpO2: 95 %  -Progression: Feels better than at presentation.  Not requiring supplemental oxygen at rest but needs to check oxygen requirement on ambulation.  CRP level is improving. -Protonix while on steroids. -Supportive care: Vitamin C, Zinc, PRN inhalers, Tylenol, Antitussives (benzonatate/ Mucinex/Tussionex).   -Encouraged incentive spirometry, out of bed and early mobilization as much as possible -Continue airborne/contact isolation precautions for duration of 3 weeks from the day of diagnosis. -WBC and inflammatory markers trend as below.  Recent Labs  Lab 05/14/20 1754 05/14/20 1755 05/14/20 2006 05/14/20 2205 05/15/20 0500 05/15/20 1040 05/16/20 0426  WBC 5.5  --   --  5.4 4.3  --  8.6  LATICACIDVEN <0.3*  --  1.1  --   --   --   --   PROCALCITON  --  <0.10  --   --   --   --   --   DDIMER  --  0.93*  --   --  0.95*  --  1.27*  FERRITIN  --  254  --   --   --  259 313*  LDH  --  261*  --   --   --   --   --   CRP  --  11.6*  --   --   --  10.3* 6.1*  ALT 17  --   --   --  19  --  27   The treatment plan and use of medications and known side effects were discussed with patient/family. Some of the medications used are based on case reports/anecdotal data.  All other medications being used in the management of COVID-19 based on limited study data.  Complete risks and long-term side effects are unknown, however in the best clinical judgment they seem to be of some benefit.  Patient wanted to proceed with treatment options provided.  Noncompliance to meds -Patient states she has lot of medicines to keep up with and tries  to do better but could be missing her pills here and there. -Suggested to improve compliance.  Type 2 diabetes mellitus  -A1c 7.3 on 8/28. -Invokana 150 mg daily, Metformin 5 mg daily, Trulance 1 mg daily -Currently oral meds are on hold.  She is on Lantus 10 units in the morning.  Blood sugar level elevated to 212 this morning.  I will add scheduled NovoLog 3 units 3 times daily with meals.  Continue sliding scale with Accu-Cheks.    Lab Results  Component Value Date   HGBA1C 7.3 (H) 05/14/2020   Recent Labs  Lab 05/15/20 1553 05/15/20 1717 05/15/20 2104 05/16/20 0737 05/16/20 1135  GLUCAP 173* 201* 161* 212* 195*   Essential hypertension -Home meds include lisinopril 5 mg daily, chlorthalidone 25 mg daily with potassium supplement. -Resume the same. -Continue to monitor blood pressure.  Hypokalemia -Potassium level is low at 2.8 on admission. -Noncompliant to supplement at home.  Resume supplement.  Continue to monitor. Recent Labs  Lab 05/14/20 1754 05/15/20 0500 05/16/20 0426  K 2.8* 3.5 3.8   Chronic hypocalcemia Vitamin D deficiency -Calcium level low.  Vitamin D level low.  Start calcium and vitamin D supplementation.    Hypothyroidism -TSH level elevated to 27.  She is already on high dose of Synthroid at home at 300 mcg.  Suspect noncompliance.  Resume the same dose for now.   Recent Labs    05/14/20 1754  TSH 27.120*   Morbid obesity - Body mass index is 48.01 kg/m. Patient has been advised to make an attempt to improve diet and exercise patterns to aid in weight loss.  Obstructive sleep apnea -Okay to use home CPAP.  Rheumatoid arthritis Chronic pain, muscle spasm -continue pain medication with tramadol prn. Hold NSAIDs -Continue weekly subcu etanercept, reported last dose on 8/22.  Next dose today. -Home meds include PRN Flexeril, tramadol and ibuprofen -Resume Neurontin  Anxiety/depression -Home meds include Cymbalta 60 mg daily, Xanax 0.5 mg  as needed at bedtime, Topamax 100 mg daily. -Resume the same.  Hyperlipidemia -Continue Lipitor 20 mg daily,  Mobility: Encourage ambulation.  Pending PT eval Code Status:   Code Status: Full Code  Nutritional status: Body mass index is 48.01 kg/m.     Diet Order            Diet heart healthy/carb modified Room service appropriate? Yes; Fluid consistency: Thin  Diet effective now                 DVT prophylaxis: Lovenox subcu  Antimicrobials:  None Fluid: None  Consultants: None Family Communication:  None at bedside  Status is: Inpatient  Remains inpatient appropriate because:Ongoing diagnostic testing needed not appropriate for outpatient work up and IV treatments appropriate due to intensity of illness or inability to take PO   Dispo: The patient is  from: Home              Anticipated d/c is to: Home               Anticipated d/c date is: 3 days              Patient currently is not medically stable to d/c.   Infusions:  . sodium chloride    . remdesivir 100 mg in NS 100 mL 100 mg (05/16/20 1002)    Scheduled Meds: . albuterol  2 puff Inhalation Q6H  . vitamin C  1,000 mg Oral TID  . atorvastatin  20 mg Oral Daily  . calcium-vitamin D  1 tablet Oral Q breakfast  . chlorthalidone  25 mg Oral Daily  . DULoxetine  60 mg Oral Daily  . enoxaparin (LOVENOX) injection  60 mg Subcutaneous Q24H  . insulin aspart  0-20 Units Subcutaneous TID WC  . insulin aspart  3 Units Subcutaneous TID WC  . insulin glargine  15 Units Subcutaneous Daily  . levothyroxine  300 mcg Oral Q0600  . linagliptin  5 mg Oral Daily  . lisinopril  5 mg Oral Daily  . melatonin  5 mg Oral QHS  . methylPREDNISolone (SOLU-MEDROL) injection  40 mg Intravenous Q12H  . pantoprazole (PROTONIX) IV  40 mg Intravenous Q24H  . potassium chloride SA  20 mEq Oral BID  . sodium chloride flush  3 mL Intravenous Q12H  . topiramate  100 mg Oral Daily  . zinc sulfate  220 mg Oral Daily     Antimicrobials: Anti-infectives (From admission, onward)   Start     Dose/Rate Route Frequency Ordered Stop   05/15/20 1000  remdesivir 100 mg in sodium chloride 0.9 % 100 mL IVPB       "Followed by" Linked Group Details   100 mg 200 mL/hr over 30 Minutes Intravenous Daily 05/14/20 1735 05/19/20 0959   05/15/20 1000  remdesivir 100 mg in sodium chloride 0.9 % 100 mL IVPB  Status:  Discontinued       "Followed by" Linked Group Details   100 mg 200 mL/hr over 30 Minutes Intravenous Daily 05/14/20 2008 05/14/20 2017   05/14/20 2015  remdesivir 200 mg in sodium chloride 0.9% 250 mL IVPB  Status:  Discontinued       "Followed by" Linked Group Details   200 mg 580 mL/hr over 30 Minutes Intravenous Once 05/14/20 2008 05/14/20 2017   05/14/20 1800  remdesivir 200 mg in sodium chloride 0.9% 250 mL IVPB       "Followed by" Linked Group Details   200 mg 580 mL/hr over 30 Minutes Intravenous Once 05/14/20 1735 05/14/20 2205      PRN meds: sodium chloride, acetaminophen, albuterol, cyclobenzaprine, guaiFENesin-dextromethorphan, ondansetron **OR** ondansetron (ZOFRAN) IV, senna-docusate, sodium chloride flush, traMADol   Objective: Vitals:   05/16/20 0526 05/16/20 1348  BP:  119/77  Pulse:  91  Resp:  20  Temp:  98.1 F (36.7 C)  SpO2: 94% 95%    Intake/Output Summary (Last 24 hours) at 05/16/2020 1401 Last data filed at 05/16/2020 0940 Gross per 24 hour  Intake 120 ml  Output --  Net 120 ml   Filed Weights   05/14/20 1222  Weight: 122.9 kg   Weight change:  Body mass index is 48.01 kg/m.   Physical Exam: General exam: Appears calm and comfortable.  Not in physical distress Skin: No rashes, lesions or ulcers. HEENT: Atraumatic, normocephalic, supple neck, no obvious  bleeding Lungs: Clear to auscultation bilaterally CVS: Regular rate and rhythm, no murmur GI/Abd soft, nontender, nondistended, bowel sound present CNS: Alert, awake, oriented x3 Psychiatry: Mood  appropriate Extremities: No pedal edema, no calf tenderness  Data Review: I have personally reviewed the laboratory data and studies available.  Recent Labs  Lab 05/14/20 1754 05/14/20 2205 05/15/20 0500 05/16/20 0426  WBC 5.5 5.4 4.3 8.6  NEUTROABS 2.2  --  3.0 6.5  HGB 12.8 12.6 11.9* 12.2  HCT 40.0 39.6 37.7 38.1  MCV 97.1 97.8 97.7 96.7  PLT 270 274 265 349   Recent Labs  Lab 05/14/20 1754 05/14/20 2005 05/14/20 2006 05/14/20 2205 05/15/20 0500 05/16/20 0426  NA 139  --   --   --  136 135  K 2.8*  --   --   --  3.5 3.8  CL 99  --   --   --  102 101  CO2 28  --   --   --  24 23  GLUCOSE 127*  --   --   --  250* 235*  BUN 10  --   --   --  10 15  CREATININE 0.83  --   --  0.84 0.71 0.88  CALCIUM 7.1*  --   --   --  6.9* 7.3*  MG  --   --  2.0  --   --   --   PHOS  --  2.7  --   --   --   --    Lab Results  Component Value Date   HGBA1C 7.3 (H) 05/14/2020       Component Value Date/Time   CHOL 162 11/12/2017 1203   TRIG 109 05/14/2020 1755   HDL 47 11/12/2017 1203   CHOLHDL 3.4 11/12/2017 1203   CHOLHDL 3.2 08/20/2016 0443   VLDL 17 08/20/2016 0443   LDLCALC 97 11/12/2017 1203   LABVLDL 18 11/12/2017 1203   Signed, Terrilee Croak, MD [facility] Pager: [phone] (Secure Chat preferred). 05/16/2020

## 2020-05-16 NOTE — Evaluation (Signed)
Physical Therapy Evaluation-1x Patient Details Name: Emily Mathis MRN: [id number] DOB: 1965/12/12 Today's Date: 05/16/2020   History of Present Illness  54 yo female admitted with COVID. Hx of obesity, RA  Clinical Impression  On eval, pt was Mod Ind with mobility. She walked ~60 feet around the room. O2 92% on RA. Some mild dyspnea and coughing observed during session. Pt reports she has been walking to/from bathroom unassisted. Recommended to her that she try to mobilize as often as she can tolerate but to also rest when needed-pt agreeable. Do not anticipate any f/u PT needs at this time. 1x eval. Will sign off.     Follow Up Recommendations No PT follow up    Equipment Recommendations  None recommended by PT    Recommendations for Other Services       Precautions / Restrictions Precautions Precautions: Fall Restrictions Weight Bearing Restrictions: No      Mobility  Bed Mobility Overal bed mobility: Modified Independent                Transfers Overall transfer level: Modified independent                  Ambulation/Gait Ambulation/Gait assistance: Modified independent (Device/Increase time) Gait Distance (Feet): 60 Feet Assistive device: None Gait Pattern/deviations: Step-through pattern;Decreased stride length     General Gait Details: slow gait speed. no lob. O2 92% on RA  Stairs            Wheelchair Mobility    Modified Rankin (Stroke Patients Only)       Balance Overall balance assessment: Mild deficits observed, not formally tested                                           Pertinent Vitals/Pain Pain Assessment: No/denies pain    Home Living Family/patient expects to be discharged to:: Private residence Living Arrangements: Children Available Help at Discharge: Family Type of Home: House Home Access: Stairs to enter Entrance Stairs-Rails: Right   Home Layout: One level Home Equipment: None       Prior Function Level of Independence: Independent               Hand Dominance        Extremity/Trunk Assessment   Upper Extremity Assessment Upper Extremity Assessment: Overall WFL for tasks assessed    Lower Extremity Assessment Lower Extremity Assessment: Generalized weakness    Cervical / Trunk Assessment Cervical / Trunk Assessment: Normal  Communication   Communication: No difficulties  Cognition Arousal/Alertness: Awake/alert Behavior During Therapy: WFL for tasks assessed/performed Overall Cognitive Status: Within Functional Limits for tasks assessed                                        General Comments      Exercises     Assessment/Plan    PT Assessment Patent does not need any further PT services  PT Problem List         PT Treatment Interventions      PT Goals (Current goals can be found in the Care Plan section)  Acute Rehab PT Goals Patient Stated Goal: to continue to feel better PT Goal Formulation: All assessment and education complete, DC therapy    Frequency     Barriers  to discharge        Co-evaluation               AM-PAC PT "6 Clicks" Mobility  Outcome Measure Help needed turning from your back to your side while in a flat bed without using bedrails?: None Help needed moving from lying on your back to sitting on the side of a flat bed without using bedrails?: None Help needed moving to and from a bed to a chair (including a wheelchair)?: None Help needed standing up from a chair using your arms (e.g., wheelchair or bedside chair)?: None Help needed to walk in hospital room?: None Help needed climbing 3-5 steps with a railing? : None 6 Click Score: 24    End of Session   Activity Tolerance: Patient tolerated treatment well Patient left: in bed;with call bell/phone within reach        Time: 1520-1530 PT Time Calculation (min) (ACUTE ONLY): 10 min   Charges:   PT Evaluation $PT Eval Low  Complexity: [place], PT Acute Rehabilitation  Office: [phone] Pager: [phone]

## 2020-05-16 NOTE — Progress Notes (Signed)
Patient brought home CPAP machine.

## 2020-05-17 LAB — CBC WITH DIFFERENTIAL/PLATELET
Abs Immature Granulocytes: 0.05 10*3/uL (ref 0.00–0.07)
Basophils Absolute: 0 10*3/uL (ref 0.0–0.1)
Basophils Relative: 0 %
Eosinophils Absolute: 0 10*3/uL (ref 0.0–0.5)
Eosinophils Relative: 0 %
HCT: 38.4 % (ref 36.0–46.0)
Hemoglobin: 12 g/dL (ref 12.0–15.0)
Immature Granulocytes: 1 %
Lymphocytes Relative: 16 %
Lymphs Abs: 1.6 10*3/uL (ref 0.7–4.0)
MCH: 30.3 pg (ref 26.0–34.0)
MCHC: 31.3 g/dL (ref 30.0–36.0)
MCV: 97 fL (ref 80.0–100.0)
Monocytes Absolute: 0.4 10*3/uL (ref 0.1–1.0)
Monocytes Relative: 4 %
Neutro Abs: 7.7 10*3/uL (ref 1.7–7.7)
Neutrophils Relative %: 79 %
Platelets: 393 10*3/uL (ref 150–400)
RBC: 3.96 MIL/uL (ref 3.87–5.11)
RDW: 14.6 % (ref 11.5–15.5)
WBC: 9.8 10*3/uL (ref 4.0–10.5)
nRBC: 0 % (ref 0.0–0.2)

## 2020-05-17 LAB — COMPREHENSIVE METABOLIC PANEL
ALT: 45 U/L — ABNORMAL HIGH (ref 0–44)
AST: 51 U/L — ABNORMAL HIGH (ref 15–41)
Albumin: 3.3 g/dL — ABNORMAL LOW (ref 3.5–5.0)
Alkaline Phosphatase: 44 U/L (ref 38–126)
Anion gap: 10 (ref 5–15)
BUN: 17 mg/dL (ref 6–20)
CO2: 27 mmol/L (ref 22–32)
Calcium: 7.8 mg/dL — ABNORMAL LOW (ref 8.9–10.3)
Chloride: 99 mmol/L (ref 98–111)
Creatinine, Ser: 0.93 mg/dL (ref 0.44–1.00)
GFR calc Af Amer: >60 mL/min (ref >60)
GFR calc non Af Amer: >60 mL/min (ref >60)
Glucose, Bld: 200 mg/dL — ABNORMAL HIGH (ref 70–99)
Potassium: 3.8 mmol/L (ref 3.5–5.1)
Sodium: 136 mmol/L (ref 135–145)
Total Bilirubin: 0.8 mg/dL (ref 0.3–1.2)
Total Protein: 7.6 g/dL (ref 6.5–8.1)

## 2020-05-17 LAB — FERRITIN: Ferritin: 293 ng/mL (ref 11–307)

## 2020-05-17 LAB — GLUCOSE, CAPILLARY
Glucose-Capillary: 200 mg/dL — ABNORMAL HIGH (ref 70–99)
Glucose-Capillary: 150 mg/dL — ABNORMAL HIGH (ref 70–99)

## 2020-05-17 LAB — D-DIMER, QUANTITATIVE: D-Dimer, Quant: 1.17 ug/mL-FEU — ABNORMAL HIGH (ref 0.00–0.50)

## 2020-05-17 LAB — C-REACTIVE PROTEIN: CRP: 2.7 mg/dL — ABNORMAL HIGH (ref <1.0)

## 2020-05-17 MED ORDER — PREDNISONE 10 MG PO TABS: ORAL_TABLET | ORAL | 0 refills | Status: AC

## 2020-05-17 MED ORDER — CALCIUM CARBONATE-VITAMIN D 500-200 MG-UNIT PO TABS: 1.0000 | ORAL_TABLET | Freq: Every day | ORAL | 0 refills | Status: AC

## 2020-05-17 MED ORDER — PANTOPRAZOLE SODIUM 40 MG PO TBEC: 40.0000 mg | DELAYED_RELEASE_TABLET | Freq: Every day | ORAL | 0 refills | Status: AC

## 2020-05-17 NOTE — Progress Notes (Signed)
Discharge instructions given with stated understanding.  Patient waiting for transportation home at this time 

## 2020-05-17 NOTE — Progress Notes (Signed)
Pt has had a good night, slept very little , using her  CPAP, c/o constipation. I instructed her to sleep on her sides or stomach as much as possible - she states she did not think she could get on stomach to sleep. Stayed on Room Air with O2 sats>95%

## 2020-05-17 NOTE — Discharge Summary (Signed)
Physician Discharge Summary  Emily Mathis WUJ:[id number] DOB: 02/05/1966 DOA: 05/14/2020  PCP: Kerin Salen, PA-C  Admit date: 05/14/2020 Discharge date: 05/17/2020  Admitted From: Home Discharge disposition: Home   Code Status: Full Code  Diet Recommendation: Cardiac/diabetic diet  Discharge Diagnosis:   Principal Problem:   Pneumonia due to COVID-19 virus Active Problems:   Hypocalcemia   Hypothyroidism, postsurgical   Type 2 diabetes mellitus without complication, without long-term current use of insulin (HCC)   OSA (obstructive sleep apnea)   Essential hypertension   Hypokalemia   Rheumatoid arthritis (HCC)   History of Present Illness / Brief narrative:  Emily Mathis is a 54 y.o. female with extensive past medical history including DM2, HTN, HLD, morbid obesity, OSA on CPAP, chronic bilateral pedal edema, rheumatoid arthritis, hypothyroidism, iron deficiency anemia, chronic pancreatitis, chronic hypocalcemia, anxiety and depression. Patient presented to the ED on 05/14/2020 with worsening shortness of breath, cough due to COVID-19. 2 weeks ago, she was exposed to her daughter's kids were Covid positive.  In 3 to 4 days, she started feeling short of breath. 8/20, patient tested positive for Covid PCR as an outpatient.  Did not require hospitalization at that time.  Symptoms continue to worsen at home.  She had a fever of 101.41-day prior to presentation.  She was extremely short of breath and was unable to carry out her activities of daily living. Of note, she did not get Covid vaccine because 'They told me it is antichrist.'  In the ED, she was afebrile, heart rate 1 6, blood pressure 145/89, oxygen saturation more than 90% on room air at rest but any movement out of bed would make her short of breath and dropped saturation to low 80s. Initial labs with WBC normal, lactic acid normal, procalcitonin normal, CRP elevated to 11.6, LDH elevated to 261, potassium  low at 2.8, calcium level low at 7.1. Chest x-ray showed bilateral multifocal pneumonia, R>L  Patient was admitted to hospitalist service for COVID-19 pneumonia. See below for details.  Subjective:  Seen and examined this morning.  Middle-aged African-American female.  Not in distress.  Feels better.  Feels ready to go home today.  Hospital Course:  COVID pneumonia Acute respiratory failure with hypoxia  -Presented with worsening shortness of breath -COVID test: PCR positive on 8/19 -Chest imaging: Multifocal pneumonia, right more than left  -Treatment: Patient was treated with IV remdesivir and IV Solu-Medrol.  Inflammatory markers have improved. -Initially she required supplemental oxygen on ambulation.  It improved and she is currently not on supplemental oxygen. -Continue prednisone tapering course at discharge. -Protonix while on steroids. -Supportive care: Vitamin C, Zinc, PRN inhalers, Tylenol, Antitussives (benzonatate/ Mucinex/Tussionex).   -Encouraged incentive spirometry, out of bed and early mobilization as much as possible -Continue airborne/contact isolation precautions for duration of 3 weeks from the day of diagnosis. -WBC and inflammatory markers trend as below.  Recent Labs  Lab 05/14/20 1754 05/14/20 1755 05/14/20 2006 05/14/20 2205 05/15/20 0500 05/15/20 1040 05/16/20 0426 05/17/20 0337  WBC 5.5  --   --  5.4 4.3  --  8.6 9.8  LATICACIDVEN <0.3*  --  1.1  --   --   --   --   --   PROCALCITON  --  <0.10  --   --   --   --   --   --   DDIMER  --  0.93*  --   --  0.95*  --  1.27* 1.17*  FERRITIN  --  254  --   --   --  259 313* 293  LDH  --  261*  --   --   --   --   --   --   CRP  --  11.6*  --   --   --  10.3* 6.1* 2.7*  ALT 17  --   --   --  19  --  27 45*   The treatment plan and use of medications and known side effects were discussed with patient/family. Some of the medications used are based on case reports/anecdotal data.  All other medications  being used in the management of COVID-19 based on limited study data.  Complete risks and long-term side effects are unknown, however in the best clinical judgment they seem to be of some benefit.  Patient wanted to proceed with treatment options provided.  Noncompliance to meds -Patient states she has lot of medicines to keep up with and tries to do better but could be missing her pills here and there. -Suggested to improve compliance.  Type 2 diabetes mellitus  -A1c 7.3 on 8/28. -Invokana 150 mg daily, Metformin 5 mg daily, Trulance 1 mg daily -Resume at discharge.  Blood sugar level may run high while on steroids.  Should improve after that. Lab Results  Component Value Date   HGBA1C 7.3 (H) 05/14/2020   Recent Labs  Lab 05/16/20 1135 05/16/20 1648 05/16/20 2107 05/17/20 0828 05/17/20 1118  GLUCAP 195* 228* 159* 200* 150*   Essential hypertension -Home meds include lisinopril 5 mg daily, chlorthalidone 25 mg daily with potassium supplement. -Continue the same.  Hypokalemia -Potassium level is low at 2.8 on admission.  Encouraged to improve compliance to potassium supplement at home. Recent Labs  Lab 05/14/20 1754 05/15/20 0500 05/16/20 0426 05/17/20 0337  K 2.8* 3.5 3.8 3.8   Chronic hypocalcemia Vitamin D deficiency -Calcium level low.  Vitamin D level low.  Started calcium and vitamin D supplementation.    Hypothyroidism -TSH level elevated to 27.  She is already on high dose of Synthroid at home at 300 mcg.  Suggested to improve compliance.   Recent Labs    05/14/20 1754  TSH 27.120*   Morbid obesity - Body mass index is 48.01 kg/m. Patient has been advised to make an attempt to improve diet and exercise patterns to aid in weight loss.  Obstructive sleep apnea -Okay to use home CPAP.  Rheumatoid arthritis Chronic pain, muscle spasm -continue pain medication with tramadol prn. Hold NSAIDs -Continue weekly subcu etanercept, reported last dose on 8/22.   Next dose today. -Home meds include PRN Flexeril, tramadol and ibuprofen -Resume Neurontin  Anxiety/depression -Home meds include Cymbalta 60 mg daily, Xanax 0.5 mg as needed at bedtime, Topamax 100 mg daily. -Continue the same.  Hyperlipidemia -Continue Lipitor 20 mg daily,  Mobility: Encourage ambulation.  PT eval obtained.  No outpatient needs. Code Status:   Code Status: Full Code    Wound care: Incision (Closed) 06/11/16 Abdomen Other (Comment) (Active)  Date First Assessed/Time First Assessed: 06/11/16 0811   Location: Abdomen  Location Orientation: Other (Comment)    Assessments 06/11/2016  9:45 PM 06/12/2016  2:23 PM  Dressing Type Honeycomb Honeycomb  Dressing Clean;Dry;Intact Clean;Dry;Intact  Dressing Change Frequency -- PRN  Margins Attached edges (approximated) Attached edges (approximated)  Drainage Amount None None     No Linked orders to display     Incision (Closed) 06/11/16 Perineum Other (Comment) (Active)  Date First Assessed/Time First  Assessed: 06/11/16 8119   Location: Perineum  Location Orientation: Other (Comment)    Assessments 06/11/2016 11:30 AM 06/12/2016  2:23 PM  Dressing Type Peripads Peripads  Dressing New drainage Clean;Dry;Intact  Dressing Change Frequency -- PRN  Drainage Amount Scant None  Drainage Description Sanguineous --     No Linked orders to display     Incision - 4 Ports Abdomen 1: Umbilicus 2: Right;Medial 3: Right;Lateral 4: Left (Active)  Placement Date: 06/11/16   Location of Ports: Abdomen  Port: 1:  Location Orientation: Umbilicus  Port: 2:  Location Orientation: Right;Medial  Port: 3:  Location Orientation: Right;Lateral  Port: 4:  Location Orientation: Left    Assessments 06/11/2016 10:38 AM 06/12/2016  2:23 PM  Port 1 Site Assessment -- [employer] 1 Margins -- Attached edges (approximated)  Port 1 Drainage Amount -- None  Port 1 Dressing Type Honeycomb;Liquid skin adhesive Liquid skin adhesive  Port 1 Dressing  Status -- Clean;Dry;Intact  Port 2 Site Assessment -- [employer] 2 Margins -- Attached edges (approximated)  Port 2 Drainage Amount -- None  Port 2 Dressing Type Liquid skin adhesive Liquid skin adhesive  Port 2 Dressing Status -- Clean;Dry;Intact  Port 3 Site Assessment -- Clean;Dry  Port 3 Margins -- Attached edges (approximated)  Port 3 Drainage Amount -- None  Port 3 Dressing Type Liquid skin adhesive Liquid skin adhesive  Port 3 Dressing Status -- Clean;Dry;Intact  Port 4 Site Assessment -- Clean;Dry  Port 4 Margins -- Attached edges (approximated)  Port 4 Drainage Amount -- None  Port 4 Dressing Type Liquid skin adhesive Liquid skin adhesive  Port 4 Dressing Status -- Clean;Dry;Intact     No Linked orders to display    Discharge Exam:   Vitals:   05/16/20 0526 05/16/20 1348 05/16/20 2106 05/17/20 0409  BP:  119/77 (!) 147/94 123/82  Pulse:  91 91 82  Resp:  20 20 18   Temp:  98.1 F (36.7 C) 98 F (36.7 C) 98.1 F (36.7 C)  TempSrc:  Oral    SpO2: 94% 95% 96% 98%  Weight:      Height:        Body mass index is 48.01 kg/m.  General exam: Appears calm and comfortable.  Not in physical distress Skin: No rashes, lesions or ulcers. HEENT: Atraumatic, normocephalic, supple neck, no obvious bleeding Lungs: Clear to auscultation bilaterally CVS: Regular rate and rhythm, no murmur GI/Abd soft, nontender, nondistended, bowel sound present CNS: Alert, awake, oriented x3 Psychiatry: Mood appropriate Extremities: No pedal edema, no calf tenderness  Follow ups:   Discharge Instructions    Diet - low sodium heart healthy   Complete by: As directed    Diet Carb Modified   Complete by: As directed    Increase activity slowly   Complete by: As directed       Follow-up Information    Kerin Salen, PA-C Follow up.   Specialty: Internal Medicine Contact information: [address] [place] [address] [phone]                Recommendations for Outpatient Follow-Up:   1. Follow-up with PCP as an outpatient  Discharge Instructions:  Follow with Primary MD Kerin Salen, PA-C in 7 days   Get CBC/BMP checked in next visit within 1 week by PCP or SNF MD ( we routinely change or add medications that can affect your baseline labs and fluid status, therefore we recommend that you get the  mentioned basic workup next visit with your PCP, your PCP may decide not to get them or add new tests based on their clinical decision)  On your next visit with your PCP, please Get Medicines reviewed and adjusted.  Please request your PCP  to go over all Hospital Tests and Procedure/Radiological results at the follow up, please get all Hospital records sent to your Prim MD by signing hospital release before you go home.  Activity: As tolerated with Full fall precautions use walker/cane & assistance as needed  For Heart failure patients - Check your Weight same time everyday, if you gain over 2 pounds, or you develop in leg swelling, experience more shortness of breath or chest pain, call your Primary MD immediately. Follow Cardiac Low Salt Diet and 1.5 lit/day fluid restriction.  If you have smoked or chewed Tobacco in the last 2 yrs please stop smoking, stop any regular Alcohol  and or any Recreational drug use.  If you experience worsening of your admission symptoms, develop shortness of breath, life threatening emergency, suicidal or homicidal thoughts you must seek medical attention immediately by calling 911 or calling your MD immediately  if symptoms less severe.  You Must read complete instructions/literature along with all the possible adverse reactions/side effects for all the Medicines you take and that have been prescribed to you. Take any new Medicines after you have completely understood and accpet all the possible adverse reactions/side effects.   Do not drive, operate heavy machinery, perform activities  at heights, swimming or participation in water activities or provide baby sitting services if your were admitted for syncope or siezures until you have seen by Primary MD or a Neurologist and advised to do so again.  Do not drive when taking Pain medications.  Do not take more than prescribed Pain, Sleep and Anxiety Medications  Wear Seat belts while driving.   Please note You were cared for by a hospitalist during your hospital stay. If you have any questions about your discharge medications or the care you received while you were in the hospital after you are discharged, you can call the unit and asked to speak with the hospitalist on call if the hospitalist that took care of you is not available. Once you are discharged, your primary care physician will handle any further medical issues. Please note that NO REFILLS for any discharge medications will be authorized once you are discharged, as it is imperative that you return to your primary care physician (or establish a relationship with a primary care physician if you do not have one) for your aftercare needs so that they can reassess your need for medications and monitor your lab values.    Allergies as of 05/17/2020      Reactions   Dilaudid [hydromorphone Hcl] Itching   Hydrocodone Itching   Latex Itching, Other (See Comments)   leaves marks on skin      Medication List    STOP taking these medications   ALPRAZolam 0.5 MG tablet Commonly known as: XANAX     TAKE these medications   acetaminophen 325 MG tablet Commonly known as: TYLENOL Take 650 mg by mouth every 6 (six) hours as needed for mild pain, fever or headache.   albuterol 108 (90 Base) MCG/ACT inhaler Commonly known as: VENTOLIN HFA Inhale 2 puffs into the lungs every 4 (four) hours as needed for wheezing or shortness of breath (cough, chest tightness). What changed: additional instructions   atorvastatin 20 MG tablet Commonly known  as: LIPITOR TAKE 1 TABLET(20  MG) BY MOUTH DAILY What changed:   how much to take  how to take this  when to take this  additional instructions   calcium-vitamin D 500-200 MG-UNIT tablet Commonly known as: OSCAL WITH D Take 1 tablet by mouth daily with breakfast. Start taking on: May 18, 2020   Canagliflozin-metFORMIN HCl 150-500 MG Tabs Commonly known as: Invokamet Take 1 tablet by mouth daily.   celecoxib 200 MG capsule Commonly known as: CELEBREX Take 1 capsule (200 mg total) by mouth daily.   chlorthalidone 25 MG tablet Commonly known as: HYGROTON TAKE 1 TABLET(25 MG) BY MOUTH DAILY What changed:   how much to take  how to take this  when to take this  additional instructions   conjugated estrogens vaginal cream Commonly known as: Premarin Use 1/2 g vaginally every night at bed time for the first 2 weeks, then use 1/2 g vaginally two times per week. What changed:   how much to take  how to take this  when to take this  reasons to take this   cyclobenzaprine 10 MG tablet Commonly known as: FLEXERIL Take 1 tablet (10 mg total) by mouth 3 (three) times daily as needed for muscle spasms.   diazepam 5 MG tablet Commonly known as: VALIUM TAKE 1 TABLET(5 MG) BY MOUTH DAILY AS NEEDED FOR MUSCLE SPASMS   docusate sodium 250 MG capsule Commonly known as: COLACE Take 1 capsule (250 mg total) by mouth daily.   DULoxetine 60 MG capsule Commonly known as: CYMBALTA Take 60 mg by mouth daily.   etanercept 50 MG/ML injection Commonly known as: ENBREL Inject 50 mg into the skin every Sunday.   fluticasone 50 MCG/ACT nasal spray Commonly known as: FLONASE Place 2 sprays into both nostrils daily as needed for allergies or rhinitis.   gabapentin 300 MG capsule Commonly known as: NEURONTIN Take 1 capsule by mouth 2 (two) times daily.   ibuprofen 200 MG tablet Commonly known as: ADVIL Take 400 mg by mouth every 6 (six) hours as needed for fever, headache or mild pain.    levothyroxine 300 MCG tablet Commonly known as: SYNTHROID Take 1 tablet (300 mcg total) by mouth daily. What changed: when to take this   lisinopril 5 MG tablet Commonly known as: ZESTRIL Take 1 tablet (5 mg total) by mouth daily.   nystatin powder Commonly known as: MYCOSTATIN/NYSTOP Apply topically 4 (four) times daily. What changed:   how much to take  when to take this  reasons to take this   ONE-A-DAY WOMENS PO Take 1 tablet by mouth daily.   pantoprazole 40 MG tablet Commonly known as: Protonix Take 1 tablet (40 mg total) by mouth daily for 10 days.   polyethylene glycol 17 g packet Commonly known as: MIRALAX / GLYCOLAX Take 17 g by mouth 2 (two) times daily. Take 2 packets today (day one), wait 2 hours, if no stool produced then take 2 more packets on day one; then continue taking 2 packets daily until you achieve daily soft stools; if water stool develops then cut back to 1 packet daily.   potassium chloride SA 20 MEQ tablet Commonly known as: KLOR-CON Take 1 tablet (20 mEq total) by mouth 2 (two) times daily.   predniSONE 10 MG tablet Commonly known as: DELTASONE Take 4 tablets (40 mg) daily for 2 days, then, Take 3 tablets (30 mg) daily for 2 days, then, Take 2 tablets (20 mg) daily for 2 days,  then, Take 1 tablets (10 mg) daily for 1 days, then stop.   senna 8.6 MG Tabs tablet Commonly known as: SENOKOT Take 1 tablet (8.6 mg total) by mouth daily as needed for mild constipation.   topiramate 100 MG tablet Commonly known as: TOPAMAX Take 1 tablet by mouth daily.   traMADol 50 MG tablet Commonly known as: ULTRAM Take 50 mg by mouth every 6 (six) hours as needed for moderate pain.   Trulance 3 MG Tabs Generic drug: Plecanatide Take 1 tablet by mouth daily.       Time coordinating discharge: 35 minutes  The results of significant diagnostics from this hospitalization (including imaging, microbiology, ancillary and laboratory) are listed below for  reference.    Procedures and Diagnostic Studies:   DG Chest 2 View  Result Date: 05/14/2020 CLINICAL DATA:  Patient reports she tested positive for covid on 8/23. For past two days has developed worsening shobr. Patient says she is coughing so much it hurts to breathe and has caused chest pain 6/10. EXAM: CHEST - 2 VIEW COMPARISON:  Chest radiograph 05/03/2018 FINDINGS: Increased right hilar prominence, possibly representing adenopathy. Otherwise cardiomediastinal contours within normal limits. There are bilateral right greater than left patchy airspace opacities concerning for multifocal infection. No pneumothorax or pleural effusion. No acute finding in the visualized skeleton. IMPRESSION: 1. Bilateral right greater than left patchy airspace opacities concerning for multifocal pneumonia. 2. Increased right hilar prominence, possibly representing reactive adenopathy. Recommend attention on follow-up. Electronically Signed   By: Emmaline Kluver M.D.   On: 05/14/2020 13:56   DG Chest Port 1 View  Result Date: 05/15/2020 CLINICAL DATA:  Worsening shortness of breath. Dry cough due to COVID-19 over the last 8 days. EXAM: PORTABLE CHEST 1 VIEW COMPARISON:  May 14, 2020 FINDINGS: Mild infiltrate remains in the periphery of the right lower lung. Minimal infiltrate on the left. The patient has bilateral pulmonary infiltrates are overall improved in appearance. No change in the cardiomediastinal silhouette. No pneumothorax. No other interval changes. IMPRESSION: 1. Bilateral pulmonary infiltrates, less conspicuous in the interval. Recommend follow-up to complete resolution. 2. No other interval changes. Electronically Signed   By: Gerome Sam III M.D   On: 05/15/2020 08:39     Labs:   Basic Metabolic Panel: Recent Labs  Lab 05/14/20 1754 05/14/20 1754 05/14/20 2005 05/14/20 2006 05/14/20 2205 05/15/20 0500 05/15/20 0500 05/16/20 0426 05/17/20 0337  NA 139  --   --   --   --  136  --  135  136  K 2.8*   < >  --   --   --  3.5   < > 3.8 3.8  CL 99  --   --   --   --  102  --  101 99  CO2 28  --   --   --   --  24  --  23 27  GLUCOSE 127*  --   --   --   --  250*  --  235* 200*  BUN 10  --   --   --   --  10  --  15 17  CREATININE 0.83  --   --   --  0.84 0.71  --  0.88 0.93  CALCIUM 7.1*  --   --   --   --  6.9*  6.9*  --  7.3* 7.8*  MG  --   --   --  2.0  --   --   --   --   --  PHOS  --   --  2.7  --   --   --   --   --   --    < > = values in this interval not displayed.   GFR Estimated Creatinine Clearance: 89 mL/min (by C-G formula based on SCr of 0.93 mg/dL). Liver Function Tests: Recent Labs  Lab 05/14/20 1754 05/15/20 0500 05/16/20 0426 05/17/20 0337  AST 25 29 32 51*  ALT 17 19 27  45*  ALKPHOS 48 45 43 44  BILITOT 0.7 0.5 0.6 0.8  PROT 7.9 7.7 7.6 7.6  ALBUMIN 3.3* 3.2* 3.3* 3.3*   No results for input(s): LIPASE, AMYLASE in the last 168 hours. No results for input(s): AMMONIA in the last 168 hours. Coagulation profile No results for input(s): INR, PROTIME in the last 168 hours.  CBC: Recent Labs  Lab 05/14/20 1754 05/14/20 2205 05/15/20 0500 05/16/20 0426 05/17/20 0337  WBC 5.5 5.4 4.3 8.6 9.8  NEUTROABS 2.2  --  3.0 6.5 7.7  HGB 12.8 12.6 11.9* 12.2 12.0  HCT 40.0 39.6 37.7 38.1 38.4  MCV 97.1 97.8 97.7 96.7 97.0  PLT 270 274 265 349 393   Cardiac Enzymes: No results for input(s): CKTOTAL, CKMB, CKMBINDEX, TROPONINI in the last 168 hours. BNP: Invalid input(s): POCBNP CBG: Recent Labs  Lab 05/16/20 1135 05/16/20 1648 05/16/20 2107 05/17/20 0828 05/17/20 1118  GLUCAP 195* 228* 159* 200* 150*   D-Dimer Recent Labs    05/16/20 0426 05/17/20 0337  DDIMER 1.27* 1.17*   Hgb A1c Recent Labs    05/14/20 1754  HGBA1C 7.3*   Lipid Profile Recent Labs    05/14/20 1755  TRIG 109   Thyroid function studies Recent Labs    05/14/20 1754  TSH 27.120*   Anemia work up Recent Labs    05/16/20 0426 05/17/20 0337   FERRITIN 313* 293   Microbiology Recent Results (from the past 240 hour(s))  Blood Culture (routine x 2)     Status: None (Preliminary result)   Collection Time: 05/14/20  5:55 PM   Specimen: BLOOD  Result Value Ref Range Status   Specimen Description   Final    BLOOD BLOOD LEFT FOREARM Performed at [facility], [address], [place], [place] [address]    Special Requests   Final    BOTTLES DRAWN AEROBIC AND ANAEROBIC Blood Culture adequate volume Performed at [facility], [address], [place], [place] [address]    Culture   Final    NO GROWTH 3 DAYS Performed at [facility], [address], [place], [place] [address]    Report Status PENDING  Incomplete  Blood Culture (routine x 2)     Status: None (Preliminary result)   Collection Time: 05/14/20  8:06 PM   Specimen: BLOOD  Result Value Ref Range Status   Specimen Description   Final    BLOOD RIGHT ANTECUBITAL Performed at [facility], [address] [place], [place] [address]    Special Requests   Final    BOTTLES DRAWN AEROBIC AND ANAEROBIC Blood Culture results may not be optimal due to an inadequate volume of blood received in culture bottles Performed at [facility], [address], [place], [place] [address]    Culture   Final    NO GROWTH 3 DAYS Performed at [facility], [address] [place], [place] [address]    Report Status PENDING  Incomplete  SARS Coronavirus 2 by RT PCR (hospital order, performed in [facility] hospital lab) Nasopharyngeal Nasopharyngeal Swab     Status: Abnormal   Collection Time: 05/15/20 10:28 AM   Specimen: Nasopharyngeal Swab  Result Value Ref Range Status   SARS Coronavirus 2 POSITIVE (A) NEGATIVE Final    Comment: RESULT CALLED TO, READ BACK BY AND VERIFIED WITH: Electa Sniff RN [id number] 05/15/20 JM (NOTE) SARS-CoV-2 target nucleic acids are DETECTED  SARS-CoV-2 RNA is generally  detectable in upper respiratory specimens  during the acute phase of infection.  Positive results are indicative  of the presence of the identified virus, but do not rule out bacterial infection or co-infection with other pathogens not detected by the test.  Clinical correlation with patient history and  other diagnostic information is necessary to determine patient infection status.  The expected result is negative.  Fact Sheet for Patients:   BoilerBrush.com.cy   Fact Sheet for Healthcare Providers:   https://pope.com/    This test is not yet approved or cleared by the [place] FDA and  has been authorized for detection and/or diagnosis of SARS-CoV-2 by FDA under an Emergency Use Authorization (EUA).  This EUA will remain in effect (meaning this test ca n be used) for the duration of  the COVID-19 declaration under Section 564(b)(1) of the Act, 21 U.S.C. section 360-bbb-3(b)(1), unless the authorization is terminated or revoked sooner.  Performed at [facility], [address] [place], [place] [address]      Signed: Melina Schools Vicente Weidler  [facility] Hospitalists 05/17/2020, 1:22 PM

## 2020-05-17 NOTE — Plan of Care (Signed)

## 2020-05-17 NOTE — Discharge Instructions (Signed)
10 Things You Can Do to Manage Your COVID-19 Symptoms at Home If you have possible or confirmed COVID-19: 1. Stay home from work and school. And stay away from other public places. If you must go out, avoid using any kind of public transportation, ridesharing, or taxis. 2. Monitor your symptoms carefully. If your symptoms get worse, call your healthcare provider immediately. 3. Get rest and stay hydrated. 4. If you have a medical appointment, call the healthcare provider ahead of time and tell them that you have or may have COVID-19. 5. For medical emergencies, call 911 and notify the dispatch personnel that you have or may have COVID-19. 6. Cover your cough and sneezes with a tissue or use the inside of your elbow. 7. Wash your hands often with soap and water for at least 20 seconds or clean your hands with an alcohol-based hand sanitizer that contains at least 60% alcohol. 8. As much as possible, stay in a specific room and away from other people in your home. Also, you should use a separate bathroom, if available. If you need to be around other people in or outside of the home, wear a mask. 9. Avoid sharing personal items with other people in your household, like dishes, towels, and bedding. 10. Clean all surfaces that are touched often, like counters, tabletops, and doorknobs. Use household cleaning sprays or wipes according to the label instructions. [email or web address] 03/18/2019 This information is not intended to replace advice given to you by your health care provider. Make sure you discuss any questions you have with your health care provider. Document Revised: 08/20/2019 Document Reviewed: 08/20/2019 Elsevier Patient Education  2020 [employer].   COVID-19 COVID-19 is a respiratory infection that is caused by a virus called severe acute respiratory syndrome coronavirus 2 (SARS-CoV-2). The disease is also known as coronavirus disease or novel coronavirus. In some people, the virus may  not cause any symptoms. In others, it may cause a serious infection. The infection can get worse quickly and can lead to complications, such as:  Pneumonia, or infection of the lungs.  Acute respiratory distress syndrome or ARDS. This is a condition in which fluid build-up in the lungs prevents the lungs from filling with air and passing oxygen into the blood.  Acute respiratory failure. This is a condition in which there is not enough oxygen passing from the lungs to the body or when carbon dioxide is not passing from the lungs out of the body.  Sepsis or septic shock. This is a serious bodily reaction to an infection.  Blood clotting problems.  Secondary infections due to bacteria or fungus.  Organ failure. This is when your body's organs stop working. The virus that causes COVID-19 is contagious. This means that it can spread from person to person through droplets from coughs and sneezes (respiratory secretions). What are the causes? This illness is caused by a virus. You may catch the virus by:  Breathing in droplets from an infected person. Droplets can be spread by a person breathing, speaking, singing, coughing, or sneezing.  Touching something, like a table or a doorknob, that was exposed to the virus (contaminated) and then touching your mouth, nose, or eyes. What increases the risk? Risk for infection You are more likely to be infected with this virus if you:  Are within 6 feet (2 meters) of a person with COVID-19.  Provide care for or live with a person who is infected with COVID-19.  Spend time in crowded indoor spaces or  live in shared housing. Risk for serious illness You are more likely to become seriously ill from the virus if you:  Are 27 years of age or older. The higher your age, the more you are at risk for serious illness.  Live in a nursing home or long-term care facility.  Have cancer.  Have a long-term (chronic) disease such as: ? Chronic lung disease,  including chronic obstructive pulmonary disease or asthma. ? A long-term disease that lowers your body's ability to fight infection (immunocompromised). ? Heart disease, including heart failure, a condition in which the arteries that lead to the heart become narrow or blocked (coronary artery disease), a disease which makes the heart muscle thick, weak, or stiff (cardiomyopathy). ? Diabetes. ? Chronic kidney disease. ? Sickle cell disease, a condition in which red blood cells have an abnormal "sickle" shape. ? Liver disease.  Are obese. What are the signs or symptoms? Symptoms of this condition can range from mild to severe. Symptoms may appear any time from 2 to 14 days after being exposed to the virus. They include:  A fever or chills.  A cough.  Difficulty breathing.  Headaches, body aches, or muscle aches.  Runny or stuffy (congested) nose.  A sore throat.  New loss of taste or smell. Some people may also have stomach problems, such as nausea, vomiting, or diarrhea. Other people may not have any symptoms of COVID-19. How is this diagnosed? This condition may be diagnosed based on:  Your signs and symptoms, especially if: ? You live in an area with a COVID-19 outbreak. ? You recently traveled to or from an area where the virus is common. ? You provide care for or live with a person who was diagnosed with COVID-19. ? You were exposed to a person who was diagnosed with COVID-19.  A physical exam.  Lab tests, which may include: ? Taking a sample of fluid from the back of your nose and throat (nasopharyngeal fluid), your nose, or your throat using a swab. ? A sample of mucus from your lungs (sputum). ? Blood tests.  Imaging tests, which may include, X-rays, CT scan, or ultrasound. How is this treated? At present, there is no medicine to treat COVID-19. Medicines that treat other diseases are being used on a trial basis to see if they are effective against COVID-19. Your  health care provider will talk with you about ways to treat your symptoms. For most people, the infection is mild and can be managed at home with rest, fluids, and over-the-counter medicines. Treatment for a serious infection usually takes places in a hospital intensive care unit (ICU). It may include one or more of the following treatments. These treatments are given until your symptoms improve.  Receiving fluids and medicines through an IV.  Supplemental oxygen. Extra oxygen is given through a tube in the nose, a face mask, or a hood.  Positioning you to lie on your stomach (prone position). This makes it easier for oxygen to get into the lungs.  Continuous positive airway pressure (CPAP) or bi-level positive airway pressure (BPAP) machine. This treatment uses mild air pressure to keep the airways open. A tube that is connected to a motor delivers oxygen to the body.  Ventilator. This treatment moves air into and out of the lungs by using a tube that is placed in your windpipe.  Tracheostomy. This is a procedure to create a hole in the neck so that a breathing tube can be inserted.  Extracorporeal membrane  oxygenation (ECMO). This procedure gives the lungs a chance to recover by taking over the functions of the heart and lungs. It supplies oxygen to the body and removes carbon dioxide. Follow these instructions at home: Lifestyle  If you are sick, stay home except to get medical care. Your health care provider will tell you how long to stay home. Call your health care provider before you go for medical care.  Rest at home as told by your health care provider.  Do not use any products that contain nicotine or tobacco, such as cigarettes, e-cigarettes, and chewing tobacco. If you need help quitting, ask your health care provider.  Return to your normal activities as told by your health care provider. Ask your health care provider what activities are safe for you. General  instructions  Take over-the-counter and prescription medicines only as told by your health care provider.  Drink enough fluid to keep your urine pale yellow.  Keep all follow-up visits as told by your health care provider. This is important. How is this prevented?  There is no vaccine to help prevent COVID-19 infection. However, there are steps you can take to protect yourself and others from this virus. To protect yourself:   Do not travel to areas where COVID-19 is a risk. The areas where COVID-19 is reported change often. To identify high-risk areas and travel restrictions, check the CDC travel website: FatFares.com.br  If you live in, or must travel to, an area where COVID-19 is a risk, take precautions to avoid infection. ? Stay away from people who are sick. ? Wash your hands often with soap and water for 20 seconds. If soap and water are not available, use an alcohol-based hand sanitizer. ? Avoid touching your mouth, face, eyes, or nose. ? Avoid going out in public, follow guidance from your state and local health authorities. ? If you must go out in public, wear a cloth face covering or face mask. Make sure your mask covers your nose and mouth. ? Avoid crowded indoor spaces. Stay at least 6 feet (2 meters) away from others. ? Disinfect objects and surfaces that are frequently touched every day. This may include:  Counters and tables.  Doorknobs and light switches.  Sinks and faucets.  Electronics, such as phones, remote controls, keyboards, computers, and tablets. To protect others: If you have symptoms of COVID-19, take steps to prevent the virus from spreading to others.  If you think you have a COVID-19 infection, contact your health care provider right away. Tell your health care team that you think you may have a COVID-19 infection.  Stay home. Leave your house only to seek medical care. Do not use public transport.  Do not travel while you are  sick.  Wash your hands often with soap and water for 20 seconds. If soap and water are not available, use alcohol-based hand sanitizer.  Stay away from other members of your household. Let healthy household members care for children and pets, if possible. If you have to care for children or pets, wash your hands often and wear a mask. If possible, stay in your own room, separate from others. Use a different bathroom.  Make sure that all people in your household wash their hands well and often.  Cough or sneeze into a tissue or your sleeve or elbow. Do not cough or sneeze into your hand or into the air.  Wear a cloth face covering or face mask. Make sure your mask covers your nose  and mouth. Where to find more information  Centers for Disease Control and Prevention: PurpleGadgets.be  World Health Organization: https://www.castaneda.info/ Contact a health care provider if:  You live in or have traveled to an area where COVID-19 is a risk and you have symptoms of the infection.  You have had contact with someone who has COVID-19 and you have symptoms of the infection. Get help right away if:  You have trouble breathing.  You have pain or pressure in your chest.  You have confusion.  You have bluish lips and fingernails.  You have difficulty waking from sleep.  You have symptoms that get worse. These symptoms may represent a serious problem that is an emergency. Do not wait to see if the symptoms will go away. Get medical help right away. Call your local emergency services (911 in the [place] Do not drive yourself to the hospital. Let the emergency medical personnel know if you think you have COVID-19. Summary  COVID-19 is a respiratory infection that is caused by a virus. It is also known as coronavirus disease or novel coronavirus. It can cause serious infections, such as pneumonia, acute respiratory distress syndrome, acute respiratory failure,  or sepsis.  The virus that causes COVID-19 is contagious. This means that it can spread from person to person through droplets from breathing, speaking, singing, coughing, or sneezing.  You are more likely to develop a serious illness if you are 69 years of age or older, have a weak immune system, live in a nursing home, or have chronic disease.  There is no medicine to treat COVID-19. Your health care provider will talk with you about ways to treat your symptoms.  Take steps to protect yourself and others from infection. Wash your hands often and disinfect objects and surfaces that are frequently touched every day. Stay away from people who are sick and wear a mask if you are sick. This information is not intended to replace advice given to you by your health care provider. Make sure you discuss any questions you have with your health care provider. Document Revised: 07/03/2019 Document Reviewed: 10/09/2018 Elsevier Patient Education  2020 [employer].   COVID-19 Frequently Asked Questions COVID-19 (coronavirus disease) is an infection that is caused by a large family of viruses. Some viruses cause illness in people and others cause illness in animals like camels, cats, and bats. In some cases, the viruses that cause illness in animals can spread to humans. Where did the coronavirus come from? In December 2019, [place] told the [facility]) of several cases of lung disease (human respiratory illness). These cases were linked to an open seafood and livestock market in the city of [place]. The link to the seafood and livestock market suggests that the virus may have spread from animals to humans. However, since that first outbreak in December, the virus has also been shown to spread from person to person. What is the name of the disease and the virus? Disease name Early on, this disease was called novel coronavirus. This is because scientists determined that the disease was caused  by a new (novel) respiratory virus. The World Health Organization [facility]) has now named the disease COVID-19, or coronavirus disease. Virus name The virus that causes the disease is called severe acute respiratory syndrome coronavirus 2 (SARS-CoV-2). More information on disease and virus naming World Health Organization [facility]): www.who.int/emergencies/diseases/novel-coronavirus-2019/technical-guidance/naming-the-coronavirus-disease-(covid-2019)-and-the-virus-that-causes-it Who is at risk for complications from coronavirus disease? Some people may be at higher risk for complications from coronavirus  disease. This includes older adults and people who have chronic diseases, such as heart disease, diabetes, and lung disease. If you are at higher risk for complications, take these extra precautions:  Stay home as much as possible.  Avoid social gatherings and travel.  Avoid close contact with others. Stay at least 6 ft (2 m) away from others, if possible.  Wash your hands often with soap and water for at least 20 seconds.  Avoid touching your face, mouth, nose, or eyes.  Keep supplies on hand at home, such as food, medicine, and cleaning supplies.  If you must go out in public, wear a cloth face covering or face mask. Make sure your mask covers your nose and mouth. How does coronavirus disease spread? The virus that causes coronavirus disease spreads easily from person to person (is contagious). You may catch the virus by:  Breathing in droplets from an infected person. Droplets can be spread by a person breathing, speaking, singing, coughing, or sneezing.  Touching something, like a table or a doorknob, that was exposed to the virus (contaminated) and then touching your mouth, nose, or eyes. Can I get the virus from touching surfaces or objects? There is still a lot that we do not know about the virus that causes coronavirus disease. Scientists are basing a lot of information on what they know  about similar viruses, such as:  Viruses cannot generally survive on surfaces for long. They need a human body (host) to survive.  It is more likely that the virus is spread by close contact with people who are sick (direct contact), such as through: ? Shaking hands or hugging. ? Breathing in respiratory droplets that travel through the air. Droplets can be spread by a person breathing, speaking, singing, coughing, or sneezing.  It is less likely that the virus is spread when a person touches a surface or object that has the virus on it (indirect contact). The virus may be able to enter the body if the person touches a surface or object and then touches his or her face, eyes, nose, or mouth. Can a person spread the virus without having symptoms of the disease? It may be possible for the virus to spread before a person has symptoms of the disease, but this is most likely not the main way the virus is spreading. It is more likely for the virus to spread by being in close contact with people who are sick and breathing in the respiratory droplets spread by a person breathing, speaking, singing, coughing, or sneezing. What are the symptoms of coronavirus disease? Symptoms vary from person to person and can range from mild to severe. Symptoms may include:  Fever or chills.  Cough.  Difficulty breathing or feeling short of breath.  Headaches, body aches, or muscle aches.  Runny or stuffy (congested) nose.  Sore throat.  New loss of taste or smell.  Nausea, vomiting, or diarrhea. These symptoms can appear anywhere from 2 to 14 days after you have been exposed to the virus. Some people may not have any symptoms. If you develop symptoms, call your health care provider. People with severe symptoms may need hospital care. Should I be tested for this virus? Your health care provider will decide whether to test you based on your symptoms, history of exposure, and your risk factors. How does a  health care provider test for this virus? Health care providers will collect samples to send for testing. Samples may include:  Taking a swab  of fluid from the back of your nose and throat, your nose, or your throat.  Taking fluid from the lungs by having you cough up mucus (sputum) into a sterile cup.  Taking a blood sample. Is there a treatment or vaccine for this virus? Currently, there is no vaccine to prevent coronavirus disease. Also, there are no medicines like antibiotics or antivirals to treat the virus. A person who becomes sick is given supportive care, which means rest and fluids. A person may also relieve his or her symptoms by using over-the-counter medicines that treat sneezing, coughing, and runny nose. These are the same medicines that a person takes for the common cold. If you develop symptoms, call your health care provider. People with severe symptoms may need hospital care. What can I do to protect myself and my family from this virus?     You can protect yourself and your family by taking the same actions that you would take to prevent the spread of other viruses. Take the following actions:  Wash your hands often with soap and water for at least 20 seconds. If soap and water are not available, use alcohol-based hand sanitizer.  Avoid touching your face, mouth, nose, or eyes.  Cough or sneeze into a tissue, sleeve, or elbow. Do not cough or sneeze into your hand or the air. ? If you cough or sneeze into a tissue, throw it away immediately and wash your hands.  Disinfect objects and surfaces that you frequently touch every day.  Stay away from people who are sick.  Avoid going out in public, follow guidance from your state and local health authorities.  Avoid crowded indoor spaces. Stay at least 6 ft (2 m) away from others.  If you must go out in public, wear a cloth face covering or face mask. Make sure your mask covers your nose and mouth.  Stay home if you  are sick, except to get medical care. Call your health care provider before you get medical care. Your health care provider will tell you how long to stay home.  Make sure your vaccines are up to date. Ask your health care provider what vaccines you need. What should I do if I need to travel? Follow travel recommendations from your local health authority, the CDC, and WHO. Travel information and advice  Centers for Disease Control and Prevention (CDC): BodyEditor.hu  World Health Organization [facility]): ThirdIncome.ca Know the risks and take action to protect your health  You are at higher risk of getting coronavirus disease if you are traveling to areas with an outbreak or if you are exposed to travelers from areas with an outbreak.  Wash your hands often and practice good hygiene to lower the risk of catching or spreading the virus. What should I do if I am sick? General instructions to stop the spread of infection  Wash your hands often with soap and water for at least 20 seconds. If soap and water are not available, use alcohol-based hand sanitizer.  Cough or sneeze into a tissue, sleeve, or elbow. Do not cough or sneeze into your hand or the air.  If you cough or sneeze into a tissue, throw it away immediately and wash your hands.  Stay home unless you must get medical care. Call your health care provider or local health authority before you get medical care.  Avoid public areas. Do not take public transportation, if possible.  If you can, wear a mask if you must go out  of the house or if you are in close contact with someone who is not sick. Make sure your mask covers your nose and mouth. Keep your home clean  Disinfect objects and surfaces that are frequently touched every day. This may include: ? Counters and tables. ? Doorknobs and light switches. ? Sinks and faucets. ? Electronics  such as phones, remote controls, keyboards, computers, and tablets.  Wash dishes in hot, soapy water or use a dishwasher. Air-dry your dishes.  Wash laundry in hot water. Prevent infecting other household members  Let healthy household members care for children and pets, if possible. If you have to care for children or pets, wash your hands often and wear a mask.  Sleep in a different bedroom or bed, if possible.  Do not share personal items, such as razors, toothbrushes, deodorant, combs, brushes, towels, and washcloths. Where to find more information Centers for Disease Control and Prevention (CDC)  Information and news updates: https://www.butler-gonzalez.com/ World Health Organization [facility])  Information and news updates: MissExecutive.com.ee  Coronavirus health topic: https://www.castaneda.info/  Questions and answers on COVID-19: OpportunityDebt.at  Global tracker: who.sprinklr.com American Academy of Pediatrics (AAP)  Information for families: www.healthychildren.org/English/health-issues/conditions/chest-lungs/Pages/2019-Novel-Coronavirus.aspx The coronavirus situation is changing rapidly. Check your local health authority website or the CDC and [facility] websites for updates and news. When should I contact a health care provider?  Contact your health care provider if you have symptoms of an infection, such as fever or cough, and you: ? Have been near anyone who is known to have coronavirus disease. ? Have come into contact with a person who is suspected to have coronavirus disease. ? Have traveled to an area where there is an outbreak of COVID-19. When should I get emergency medical care?  Get help right away by calling your local emergency services (911 in the [place] if you have: ? Trouble breathing. ? Pain or pressure in your chest. ? Confusion. ? Blue-tinged lips and fingernails. ? Difficulty  waking from sleep. ? Symptoms that get worse. Let the emergency medical personnel know if you think you have coronavirus disease. Summary  A new respiratory virus is spreading from person to person and causing COVID-19 (coronavirus disease).  The virus that causes COVID-19 appears to spread easily. It spreads from one person to another through droplets from breathing, speaking, singing, coughing, or sneezing.  Older adults and those with chronic diseases are at higher risk of disease. If you are at higher risk for complications, take extra precautions.  There is currently no vaccine to prevent coronavirus disease. There are no medicines, such as antibiotics or antivirals, to treat the virus.  You can protect yourself and your family by washing your hands often, avoiding touching your face, and covering your coughs and sneezes. This information is not intended to replace advice given to you by your health care provider. Make sure you discuss any questions you have with your health care provider. Document Revised: 07/03/2019 Document Reviewed: 12/30/2018 [employer] Patient Education  [place].

## 2020-05-19 LAB — CULTURE, BLOOD (ROUTINE X 2)
Culture: NO GROWTH
Culture: NO GROWTH
Special Requests: ADEQUATE

## 2020-12-21 ENCOUNTER — Ambulatory Visit: Payer: BLUE CROSS/BLUE SHIELD | Admitting: Obstetrics and Gynecology

## 2021-02-09 ENCOUNTER — Ambulatory Visit: Payer: BLUE CROSS/BLUE SHIELD | Admitting: Obstetrics and Gynecology

## 2021-05-10 ENCOUNTER — Ambulatory Visit: Payer: BLUE CROSS/BLUE SHIELD | Admitting: Obstetrics and Gynecology

## 2021-07-07 ENCOUNTER — Ambulatory Visit: Payer: BLUE CROSS/BLUE SHIELD | Admitting: Obstetrics and Gynecology

## 2021-10-12 ENCOUNTER — Ambulatory Visit: Payer: BLUE CROSS/BLUE SHIELD | Admitting: Obstetrics and Gynecology

## 2021-12-13 ENCOUNTER — Ambulatory Visit: Payer: BLUE CROSS/BLUE SHIELD | Admitting: Obstetrics and Gynecology

## 2022-02-19 ENCOUNTER — Ambulatory Visit: Payer: BLUE CROSS/BLUE SHIELD | Admitting: Obstetrics and Gynecology

## 2022-03-29 NOTE — Progress Notes (Deleted)
56 y.o. [id number] Single African American female here for annual exam.    PCP:     Patient's last menstrual period was 05/08/2016 (exact date).           Sexually active: {yes no:314532}  The current method of family planning is status post hysterectomy.    Exercising: {yes no:314532}  {types:19826} Smoker:  no  Health Maintenance: Pap:   03/12/16 Neg, Neg HR HPV History of abnormal Pap:  yes,  Hx cryotherapy years ago MMG:  ***09-02-18 Neg/Birads1 Colonoscopy:  ***01/04/16 Normal;next 10 years(Has FH of colon cancer) BMD:   n/a  Result  n/a TDaP:  09-14-13 Gardasil:   n/a HIV: 05-14-20 NR Hep C: 11-17-19 Neg Screening Labs:  Hb today: ***, Urine today: ***   reports that she has never smoked. She has never used smokeless tobacco. She reports that she does not currently use alcohol. She reports that she does not use drugs.  Past Medical History:  Diagnosis Date   Abnormal Pap smear of cervix 1990   Hx cryotherapy to cervix   Abnormal uterine bleeding    Anemia 02/20/16   Transfusion of 1 unit pRBCs   Anxiety    Anxiety and depression    Arthritis    Asthma    Bulging lumbar disc 2021   Cancer ([place])    Pre-Cancer-Ovarian   Chest pain    per pt due to anemia-    Diabetes mellitus without complication (HCC)    Fibroid    Fibromyalgia    Generalized pain    GERD (gastroesophageal reflux disease)    Gout    in big toe   Headache(784.0)    History of blood transfusion 02/20/2016   Hyperlipidemia    Hypertension    per Dr. Irven Shelling note 08/30/2011   Hypothyroidism    Iron deficiency anemia 02/15/2016   Leg swelling    bilateral   Mild scoliosis 08/06/2017   Neuromuscular disorder ([place])    Osteoarthritis    in knees   Rheumatoid arthritis ([place])    Scoliosis    mild   Shortness of breath dyspnea    on exertion due to anemia   Sleep apnea    cpap machine   Thyroid disease    Hx 3 goiters--hx thyroidectomy--sees Dr. Forde Dandy    Past Surgical History:  Procedure  Laterality Date   CARDIAC CATHETERIZATION  2013   [place] N/A 06/11/2016   Procedure: CYSTOSCOPY;  Surgeon: [name], MD;  Location: [facility];  Service: Gynecology;  Laterality: N/A;   HYSTEROSCOPY WITH D & C N/A 04/10/2016   Procedure: HYSTEROSCOPY WITH fractional dilatation and curretage;  Surgeon: [name], MD;  Location: [facility];  Service: Gynecology;  Laterality: N/A;   LEFT HEART CATHETERIZATION WITH CORONARY ANGIOGRAM  07/24/2011   Procedure: LEFT HEART CATHETERIZATION WITH CORONARY ANGIOGRAM;  Surgeon: Laverda Page, MD;  Location: [facility] CATH LAB;  Service: Cardiovascular;;   ROBOTIC ASSISTED TOTAL HYSTERECTOMY WITH SALPINGECTOMY Bilateral 06/11/2016   Procedure: ROBOTIC ASSISTED TOTAL HYSTERECTOMY WITH SALPINGECTOMY;  Surgeon: [name], MD;  Location: [facility];  Service: Gynecology;  Laterality: Bilateral;   THYROIDECTOMY N/A 12/18/2012   Procedure: TOTAL THYROIDECTOMY;  Surgeon: Earnstine Regal, MD;  Location: [facility];  Service: General;  Laterality: N/A;   UPPER GI ENDOSCOPY      Current Outpatient Medications  Medication Sig Dispense Refill   acetaminophen (TYLENOL) 325 MG tablet Take 650 mg by mouth every 6 (six) hours as needed for mild pain, fever or headache.     albuterol (PROVENTIL HFA;VENTOLIN HFA) 108 (90 Base) MCG/ACT inhaler Inhale 2 puffs into the lungs every 4 (four) hours as needed for wheezing or shortness of breath (cough, chest tightness). (Patient taking differently: Inhale 2 puffs into the lungs every 4 (four) hours as needed for wheezing or shortness of breath (cough, chest tightness). ProAir) 18 g 1   atorvastatin (LIPITOR) 20 MG tablet TAKE 1 TABLET(20 MG) BY MOUTH DAILY (Patient taking differently: Take 20 mg by mouth daily. ) 90 tablet 3   calcium-vitamin D (OSCAL WITH D) 500-200 MG-UNIT tablet Take 1 tablet by mouth daily with breakfast. 30 tablet 0    Canagliflozin-metFORMIN HCl (INVOKAMET) 150-500 MG TABS Take 1 tablet by mouth daily. 30 tablet 0   celecoxib (CELEBREX) 200 MG capsule Take 1 capsule (200 mg total) by mouth daily. 60 capsule 2   chlorthalidone (HYGROTON) 25 MG tablet TAKE 1 TABLET(25 MG) BY MOUTH DAILY (Patient taking differently: Take 25 mg by mouth daily. ) 90 tablet 3   conjugated estrogens (PREMARIN) vaginal cream Use 1/2 g vaginally every night at bed time for the first 2 weeks, then use 1/2 g vaginally two times per week. (Patient taking differently: Place 1 Applicatorful vaginally as needed (hormone therapy). Use 1/2 g vaginally every night at bed time for the first 2 weeks, then use 1/2 g vaginally two times per week.) 30 g 1   cyclobenzaprine (FLEXERIL) 10 MG tablet Take 1 tablet (10 mg total) by mouth 3 (three) times daily as needed for muscle spasms. 30 tablet 0   diazepam (VALIUM) 5 MG tablet TAKE 1 TABLET(5 MG) BY MOUTH DAILY AS NEEDED FOR MUSCLE SPASMS (Patient not taking: Reported on 05/14/2020) 30 tablet 0   docusate sodium (COLACE) 250 MG capsule Take 1 capsule (250 mg total) by mouth daily. (Patient not taking: Reported on 05/14/2020) 30 capsule 0   DULoxetine (CYMBALTA) 60 MG capsule Take 60 mg by mouth daily.      etanercept (ENBREL) 50 MG/ML injection Inject 50 mg into the skin every Sunday.     fluticasone (FLONASE) 50 MCG/ACT nasal spray Place 2 sprays into both nostrils daily as needed for allergies or rhinitis.      gabapentin (NEURONTIN) 300 MG capsule Take 1 capsule by mouth 2 (two) times daily.     ibuprofen (ADVIL,MOTRIN) 200 MG tablet Take 400 mg by mouth every 6 (six) hours as needed for fever, headache or mild pain.      levothyroxine (SYNTHROID, LEVOTHROID) 300 MCG tablet Take 1 tablet (300 mcg total) by mouth daily. (Patient taking differently: Take 300 mcg by mouth daily before breakfast. ) 90 tablet 3   lisinopril (PRINIVIL,ZESTRIL) 5 MG tablet Take 1 tablet (5 mg total) by mouth daily. 90 tablet 3    Multiple Vitamins-Minerals (ONE-A-DAY WOMENS PO) Take 1 tablet by mouth daily.     nystatin (MYCOSTATIN) powder Apply topically 4 (four) times daily. (Patient taking differently: Apply 1 application topically 4 (four) times daily as needed (heat rash). ) 60 g 2   pantoprazole (PROTONIX) 40 MG tablet Take 1 tablet (40 mg total) by mouth daily for 10 days. 10 tablet 0   Plecanatide (TRULANCE) 3 MG TABS Take 1 tablet by mouth daily.     polyethylene glycol (MIRALAX / GLYCOLAX) packet Take 17 g by mouth 2 (  two) times daily. Take 2 packets today (day one), wait 2 hours, if no stool produced then take 2 more packets on day one; then continue taking 2 packets daily until you achieve daily soft stools; if water stool develops then cut back to 1 packet daily. (Patient not taking: Reported on 05/14/2020) 14 each 0   potassium chloride SA (K-DUR,KLOR-CON) 20 MEQ tablet Take 1 tablet (20 mEq total) by mouth 2 (two) times daily. (Patient not taking: Reported on 05/14/2020) 60 tablet 0   predniSONE (DELTASONE) 10 MG tablet Take 4 tablets (40 mg) daily for 2 days, then, Take 3 tablets (30 mg) daily for 2 days, then, Take 2 tablets (20 mg) daily for 2 days, then, Take 1 tablets (10 mg) daily for 1 days, then stop. 19 tablet 0   senna (SENOKOT) 8.6 MG TABS tablet Take 1 tablet (8.6 mg total) by mouth daily as needed for mild constipation. (Patient not taking: Reported on 05/14/2020) 120 each 0   topiramate (TOPAMAX) 100 MG tablet Take 1 tablet by mouth daily.     traMADol (ULTRAM) 50 MG tablet Take 50 mg by mouth every 6 (six) hours as needed for moderate pain.     No current facility-administered medications for this visit.    Family History  Problem Relation Age of Onset   Heart disease Mother    Stroke Mother    Hypertension Mother    Heart disease Father    Colon cancer Father    Hypertension Father    Diabetes Father    Cancer Paternal Grandmother    Asthma Daughter    Seizures Daughter    Hypertension  Sister    Thyroid disease Sister    Mental illness Daughter 51       suicide attempt 06/2013   Asthma Sister    Hypertension Sister    Breast cancer Paternal Aunt    Cancer Paternal Aunt     Review of Systems  Exam:   LMP 05/08/2016 (Exact Date)     General appearance: alert, cooperative and appears stated age Head: normocephalic, without obvious abnormality, atraumatic Neck: no adenopathy, supple, symmetrical, trachea midline and thyroid normal to inspection and palpation Lungs: clear to auscultation bilaterally Breasts: normal appearance, no masses or tenderness, No nipple retraction or dimpling, No nipple discharge or bleeding, No axillary adenopathy Heart: regular rate and rhythm Abdomen: soft, non-tender; no masses, no organomegaly Extremities: extremities normal, atraumatic, no cyanosis or edema Skin: skin color, texture, turgor normal. No rashes or lesions Lymph nodes: cervical, supraclavicular, and axillary nodes normal. Neurologic: grossly normal  Pelvic: External genitalia:  no lesions              No abnormal inguinal nodes palpated.              Urethra:  normal appearing urethra with no masses, tenderness or lesions              Bartholins and Skenes: normal                 Vagina: normal appearing vagina with normal color and discharge, no lesions              Cervix: no lesions              Pap taken: {yes no:314532} Bimanual Exam:  Uterus:  normal size, contour, position, consistency, mobility, non-tender              Adnexa: no mass, fullness, tenderness  Rectal exam: {yes no:314532}.  Confirms.              Anus:  normal sphincter tone, no lesions  Chaperone was present for exam:  ***  Assessment:   Well woman visit with gynecologic exam.   Plan: Mammogram screening discussed. Self breast awareness reviewed. Pap and HR HPV as above. Guidelines for Calcium, Vitamin D, regular exercise program including cardiovascular and weight bearing  exercise.   Follow up annually and prn.   Additional counseling given.  {yes [id number]. _______ minutes face to face time of which over 50% was spent in counseling.    After visit summary provided.

## 2022-04-04 ENCOUNTER — Ambulatory Visit: Payer: BLUE CROSS/BLUE SHIELD | Admitting: Obstetrics and Gynecology

## 2022-09-09 ENCOUNTER — Emergency Department (HOSPITAL_COMMUNITY)
Admission: EM | Admit: 2022-09-09 | Discharge: 2022-09-09 | Disposition: A | Discharge status: Home / Self Care | Payer: BLUE CROSS/BLUE SHIELD | Attending: Emergency Medicine | Admitting: Emergency Medicine

## 2022-09-09 ENCOUNTER — Emergency Department (HOSPITAL_COMMUNITY): Payer: BLUE CROSS/BLUE SHIELD

## 2022-09-09 DIAGNOSIS — Z91040 Latex allergy status: Secondary | ICD-10-CM | POA: Insufficient documentation

## 2022-09-09 DIAGNOSIS — U071 COVID-19: Secondary | ICD-10-CM | POA: Diagnosis not present

## 2022-09-09 LAB — RESP PANEL BY RT-PCR (RSV, FLU A&B, COVID)  RVPGX2
Influenza A by PCR: NEGATIVE
Influenza B by PCR: NEGATIVE
Resp Syncytial Virus by PCR: NEGATIVE
SARS Coronavirus 2 by RT PCR: POSITIVE — AB

## 2022-09-09 MED ORDER — ONDANSETRON 4 MG PO TBDP: ORAL_TABLET | ORAL | 0 refills | Status: AC

## 2022-09-09 MED ORDER — BENZONATATE 100 MG PO CAPS: 100.0000 mg | ORAL_CAPSULE | Freq: Three times a day (TID) | ORAL | 0 refills | Status: AC

## 2022-09-09 NOTE — Discharge Instructions (Addendum)
Take tylenol 2 pills 4 times a day and motrin 4 pills 3 times a day.  Drink plenty of fluids.  Return for worsening shortness of breath, headache, confusion. Follow up with your family doctor.   

## 2022-09-09 NOTE — ED Provider Notes (Signed)
[facility] DEPT Provider Note   CSN: [id number] Arrival date & time: 09/09/22  1803     History  Chief Complaint  Patient presents with   Covid Positive    Emily Mathis is a 56 y.o. female.  56 yo F with a cc of cough congestion fevers chills myalgias.  Going on for a few days now.  She had a coworker that did come down with COVID then she took a home COVID test was positive.  She is worried that maybe it was falsely positive and she wants to know for sure before she goes around her aunt.  She has been able to eat and drink.  Has been having myalgias.        Home Medications Prior to Admission medications   Medication Sig Start Date End Date Taking? Authorizing Provider  benzonatate (TESSALON) 100 MG capsule Take 1 capsule (100 mg total) by mouth every 8 (eight) hours. 09/09/22  Yes Deno Etienne, DO  ondansetron (ZOFRAN-ODT) 4 MG disintegrating tablet '4mg'$  ODT q4 hours prn nausea/vomit 09/09/22  Yes Deno Etienne, DO  acetaminophen (TYLENOL) 325 MG tablet Take 650 mg by mouth every 6 (six) hours as needed for mild pain, fever or headache.    [provider]  albuterol (PROVENTIL HFA;VENTOLIN HFA) 108 (90 Base) MCG/ACT inhaler Inhale 2 puffs into the lungs every 4 (four) hours as needed for wheezing or shortness of breath (cough, chest tightness). Patient taking differently: Inhale 2 puffs into the lungs every 4 (four) hours as needed for wheezing or shortness of breath (cough, chest tightness). ProAir 09/06/17   Harrison Mons, PA  atorvastatin (LIPITOR) 20 MG tablet TAKE 1 TABLET(20 MG) BY MOUTH DAILY Patient taking differently: Take 20 mg by mouth daily.  12/24/17   Harrison Mons, PA  calcium-vitamin D (OSCAL WITH D) 500-200 MG-UNIT tablet Take 1 tablet by mouth daily with breakfast. 05/18/20 06/17/20  Dahal, Marlowe Aschoff, MD  Canagliflozin-metFORMIN HCl (INVOKAMET) 150-500 MG TABS Take 1 tablet by mouth daily. 02/26/18   Harrison Mons, PA  celecoxib  (CELEBREX) 200 MG capsule Take 1 capsule (200 mg total) by mouth daily. 02/26/18   Harrison Mons, PA  chlorthalidone (HYGROTON) 25 MG tablet TAKE 1 TABLET(25 MG) BY MOUTH DAILY Patient taking differently: Take 25 mg by mouth daily.  12/24/17   Harrison Mons, PA  conjugated estrogens (PREMARIN) vaginal cream Use 1/2 g vaginally every night at bed time for the first 2 weeks, then use 1/2 g vaginally two times per week. Patient taking differently: Place 1 Applicatorful vaginally as needed (hormone therapy). Use 1/2 g vaginally every night at bed time for the first 2 weeks, then use 1/2 g vaginally two times per week. 11/25/18   [name], MD  cyclobenzaprine (FLEXERIL) 10 MG tablet Take 1 tablet (10 mg total) by mouth 3 (three) times daily as needed for muscle spasms. 11/12/16   Harrison Mons, PA  diazepam (VALIUM) 5 MG tablet TAKE 1 TABLET(5 MG) BY MOUTH DAILY AS NEEDED FOR MUSCLE SPASMS Patient not taking: Reported on 05/14/2020 02/26/18   Harrison Mons, PA  docusate sodium (COLACE) 250 MG capsule Take 1 capsule (250 mg total) by mouth daily. Patient not taking: Reported on 05/14/2020 09/06/16   Harrison Mons, PA  DULoxetine (CYMBALTA) 60 MG capsule Take 60 mg by mouth daily.  04/15/19   [provider]  etanercept (ENBREL) 50 MG/ML injection Inject 50 mg into the skin every Sunday.    [provider]  fluticasone (FLONASE) 50 MCG/ACT nasal spray Place 2 sprays into both nostrils daily as needed for allergies or rhinitis.  04/15/19   [provider]  gabapentin (NEURONTIN) 300 MG capsule Take 1 capsule by mouth 2 (two) times daily. 02/16/19   [provider]  ibuprofen (ADVIL,MOTRIN) 200 MG tablet Take 400 mg by mouth every 6 (six) hours as needed for fever, headache or mild pain.     [provider]  levothyroxine (SYNTHROID, LEVOTHROID) 300 MCG tablet Take 1 tablet (300 mcg total) by mouth daily. Patient taking differently: Take 300 mcg by  mouth daily before breakfast.  06/17/15   Harrison Mons, PA  lisinopril (PRINIVIL,ZESTRIL) 5 MG tablet Take 1 tablet (5 mg total) by mouth daily. 12/24/17   Harrison Mons, PA  Multiple Vitamins-Minerals (ONE-A-DAY WOMENS PO) Take 1 tablet by mouth daily.    [provider]  nystatin (MYCOSTATIN) powder Apply topically 4 (four) times daily. Patient taking differently: Apply 1 application topically 4 (four) times daily as needed (heat rash).  10/02/15   Harrison Mons, PA  pantoprazole (PROTONIX) 40 MG tablet Take 1 tablet (40 mg total) by mouth daily for 10 days. 05/17/20 05/27/20  Dahal, Marlowe Aschoff, MD  Plecanatide (TRULANCE) 3 MG TABS Take 1 tablet by mouth daily. 04/26/20   [provider]  polyethylene glycol (MIRALAX / GLYCOLAX) packet Take 17 g by mouth 2 (two) times daily. Take 2 packets today (day one), wait 2 hours, if no stool produced then take 2 more packets on day one; then continue taking 2 packets daily until you achieve daily soft stools; if water stool develops then cut back to 1 packet daily. Patient not taking: Reported on 05/14/2020 07/24/16   Street, Parker, PA-C  potassium chloride SA (K-DUR,KLOR-CON) 20 MEQ tablet Take 1 tablet (20 mEq total) by mouth 2 (two) times daily. Patient not taking: Reported on 05/14/2020 06/12/16   [name], MD  predniSONE (DELTASONE) 10 MG tablet Take 4 tablets (40 mg) daily for 2 days, then, Take 3 tablets (30 mg) daily for 2 days, then, Take 2 tablets (20 mg) daily for 2 days, then, Take 1 tablets (10 mg) daily for 1 days, then stop. 05/17/20   Terrilee Croak, MD  senna (SENOKOT) 8.6 MG TABS tablet Take 1 tablet (8.6 mg total) by mouth daily as needed for mild constipation. Patient not taking: Reported on 05/14/2020 08/22/16   Tonette Bihari, MD  topiramate (TOPAMAX) 100 MG tablet Take 1 tablet by mouth daily. 02/01/20   [provider]  traMADol (ULTRAM) 50 MG tablet Take 50 mg by mouth every 6 (six) hours as  needed for moderate pain.    [provider]      Allergies    Dilaudid [hydromorphone hcl], Hydrocodone, and Latex    Review of Systems   Review of Systems  Physical Exam Updated Vital Signs BP 137/88 (BP Location: Left Arm)   Pulse (!) 101   Temp 98.8 F (37.1 C) (Oral)   Resp 20   Ht '5\' 3"'$  (1.6 m)   Wt 85.3 kg   LMP 05/08/2016 (Exact Date)   SpO2 98%   BMI 33.30 kg/m  Physical Exam Vitals and nursing note reviewed.  Constitutional:      General: She is not in acute distress.    Appearance: She is well-developed. She is not diaphoretic.  HENT:     Head: Normocephalic and atraumatic.  Eyes:     Pupils: Pupils are equal, round,  and reactive to light.  Cardiovascular:     Rate and Rhythm: Normal rate and regular rhythm.     Heart sounds: No murmur heard.    No friction rub. No gallop.  Pulmonary:     Effort: Pulmonary effort is normal.     Breath sounds: No wheezing or rales.  Abdominal:     General: There is no distension.     Palpations: Abdomen is soft.     Tenderness: There is no abdominal tenderness.  Musculoskeletal:        General: No tenderness.     Cervical back: Normal range of motion and neck supple.  Skin:    General: Skin is warm and dry.  Neurological:     Mental Status: She is alert and oriented to person, place, and time.  Psychiatric:        Behavior: Behavior normal.     ED Results / Procedures / Treatments   Labs (all labs ordered are listed, but only abnormal results are displayed) Labs Reviewed  RESP PANEL BY RT-PCR (RSV, FLU A&B, COVID)  [id number]    EKG None  Radiology DG Chest 2 View  Result Date: 09/09/2022 CLINICAL DATA:  Cough.  COVID-19 EXAM: CHEST - 2 VIEW COMPARISON:  05/15/2020, 06/23/2020 FINDINGS: The heart size and mediastinal contours are within normal limits. Both lungs are clear. The visualized skeletal structures are unremarkable. IMPRESSION: No active cardiopulmonary disease. Electronically Signed   By:  Davina Poke D.O.   On: 09/09/2022 18:52    Procedures Procedures    Medications Ordered in ED Medications - No data to display  ED Course/ Medical Decision Making/ A&P                           Medical Decision Making Risk Prescription drug management.   56 yo F with a chief complaints of an upper respiratory illness.  Going on for a few days now.  She was around someone that was COVID-positive and then had a home COVID test that was positive.  She is not sure that she chest this test and would like a formal one done here in the ER setting.  I discussed with her the risks and benefits of the test and how I suspect that if she was around someone with COVID and then had a positive COVID test then she likely was COVID-positive.  I told her that this would not change any of her treatment.  She is electing to have the test performed.  Chest x-ray independently interpreted by me without focal after the pneumothorax.  Will discharge home.  9:32 PM:  I have discussed the diagnosis/risks/treatment options with the patient.  Evaluation and diagnostic testing in the emergency department does not suggest an emergent condition requiring admission or immediate intervention beyond what has been performed at this time.  They will follow up with PCP. We also discussed returning to the ED immediately if new or worsening sx occur. We discussed the sx which are most concerning (e.g., sudden worsening pain, fever, inability to tolerate by mouth) that necessitate immediate return. Medications administered to the patient during their visit and any new prescriptions provided to the patient are listed below.  Medications given during this visit Medications - No data to display   The patient appears reasonably screen and/or stabilized for discharge and I doubt any other medical condition or other [facility] requiring further screening, evaluation, or treatment in the ED at this  time prior to discharge.           Final Clinical Impression(s) / ED Diagnoses Final diagnoses:  [id number] virus infection    Rx / DC Orders ED Discharge Orders          Ordered    benzonatate (TESSALON) 100 MG capsule  Every 8 hours        09/09/22 2122    ondansetron (ZOFRAN-ODT) 4 MG disintegrating tablet        09/09/22 2122              Deno Etienne, DO 09/09/22 2132

## 2022-09-09 NOTE — ED Provider Triage Note (Addendum)
Emergency Medicine Provider Triage Evaluation Note  Emily Mathis , a 56 y.o. female  was evaluated in triage.  Pt complains of cough, congestion, and shortness of breath onset yesterday. Intermittent headaches for the past week. No chest pain, fever, chills, nausea, vomiting, diarrhea, or abdominal pain. + home COVID test, concerned for "false positive." History of COVID x 2 before with previous hospitalization for hypoxia. No focal weakness, dizziness, paresthesias.   Review of Systems  Positive: See HPI Negative: See HPI  Physical Exam  BP (!) 140/79   Pulse (!) 110   Temp 98.8 F (37.1 C) (Oral)   Resp 20   Ht '5\' 3"'$  (1.6 m)   Wt 85.3 kg   LMP 05/08/2016 (Exact Date)   SpO2 99%   BMI 33.30 kg/m  Gen:   Awake, no distress   Resp:  Mildly tachypneic, LCTA MSK:   Moves extremities without difficulty  Other:  Tachycardia with regular rhythm, no LE edema, hoarse voice, dry cough, fairly anxious, anterior chest wall tenderness  Medical Decision Making  Medically screening exam initiated at 6:27 PM.  Appropriate orders placed.  Emily Mathis was informed that the remainder of the evaluation will be completed by another provider, this initial triage assessment does not replace that evaluation, and the importance of remaining in the ED until their evaluation is complete.     Suzzette Righter, PA-C 09/09/22 1829    Suzzette Righter, PA-C 09/09/22 1829

## 2022-09-09 NOTE — ED Triage Notes (Signed)
Pt c/o headache and coughing x 1 week. Pt states she was + for COVID on home test. Pt wasn't sure if it was COVID or the flu

## 2024-05-27 ENCOUNTER — Encounter: Admitting: Obstetrics and Gynecology
